# Patient Record
Sex: Female | Born: 1940
Health system: Southern US, Community
[De-identification: ages and names within clinical notes are randomized; demographics above are authoritative.]

## PROBLEM LIST (undated history)

## (undated) DIAGNOSIS — E079 Disorder of thyroid, unspecified: Secondary | ICD-10-CM

## (undated) DIAGNOSIS — Z803 Family history of malignant neoplasm of breast: Secondary | ICD-10-CM

## (undated) DIAGNOSIS — M109 Gout, unspecified: Secondary | ICD-10-CM

## (undated) DIAGNOSIS — C189 Malignant neoplasm of colon, unspecified: Secondary | ICD-10-CM

## (undated) DIAGNOSIS — I10 Essential (primary) hypertension: Secondary | ICD-10-CM

## (undated) DIAGNOSIS — E785 Hyperlipidemia, unspecified: Secondary | ICD-10-CM

## (undated) DIAGNOSIS — A389 Scarlet fever, uncomplicated: Secondary | ICD-10-CM

## (undated) HISTORY — DX: Essential (primary) hypertension: I10

## (undated) HISTORY — DX: Malignant neoplasm of colon, unspecified: C18.9

## (undated) HISTORY — DX: Hyperlipidemia, unspecified: E78.5

## (undated) HISTORY — DX: Gout, unspecified: M10.9

## (undated) HISTORY — PX: CHOLECYSTECTOMY: SHX55

## (undated) HISTORY — DX: Family history of malignant neoplasm of breast: Z80.3

---

## 1976-04-04 HISTORY — PX: BREAST BIOPSY: SHX20

## 1999-06-18 ENCOUNTER — Emergency Department (HOSPITAL_COMMUNITY): Admission: EM | Admit: 1999-06-18 | Discharge: 1999-06-18 | Payer: Self-pay | Admitting: Emergency Medicine

## 1999-06-18 ENCOUNTER — Encounter: Payer: Self-pay | Admitting: Emergency Medicine

## 1999-08-06 ENCOUNTER — Encounter: Payer: Self-pay | Admitting: Surgery

## 1999-08-06 ENCOUNTER — Ambulatory Visit (HOSPITAL_COMMUNITY): Admission: RE | Admit: 1999-08-06 | Discharge: 1999-08-06 | Payer: Self-pay | Admitting: Surgery

## 2004-01-16 ENCOUNTER — Ambulatory Visit: Payer: Self-pay

## 2004-04-21 ENCOUNTER — Ambulatory Visit: Payer: Self-pay | Admitting: Oncology

## 2005-07-05 ENCOUNTER — Ambulatory Visit: Payer: Self-pay | Admitting: Obstetrics & Gynecology

## 2005-12-21 ENCOUNTER — Ambulatory Visit: Payer: Self-pay | Admitting: Obstetrics & Gynecology

## 2006-09-13 ENCOUNTER — Ambulatory Visit: Payer: Self-pay | Admitting: Internal Medicine

## 2007-09-26 ENCOUNTER — Ambulatory Visit: Payer: Self-pay | Admitting: Internal Medicine

## 2008-09-29 ENCOUNTER — Ambulatory Visit: Payer: Self-pay | Admitting: Internal Medicine

## 2009-09-16 ENCOUNTER — Ambulatory Visit: Payer: Self-pay | Admitting: Internal Medicine

## 2009-09-30 ENCOUNTER — Ambulatory Visit: Payer: Self-pay | Admitting: Internal Medicine

## 2009-12-02 ENCOUNTER — Ambulatory Visit: Payer: Self-pay | Admitting: Gastroenterology

## 2010-11-08 ENCOUNTER — Ambulatory Visit: Payer: Self-pay | Admitting: Internal Medicine

## 2011-11-09 ENCOUNTER — Ambulatory Visit: Payer: Self-pay | Admitting: Internal Medicine

## 2013-02-05 ENCOUNTER — Ambulatory Visit: Payer: Self-pay | Admitting: Internal Medicine

## 2013-07-10 ENCOUNTER — Ambulatory Visit (INDEPENDENT_AMBULATORY_CARE_PROVIDER_SITE_OTHER): Payer: Medicare PPO | Admitting: Podiatry

## 2013-07-10 ENCOUNTER — Encounter: Payer: Self-pay | Admitting: Podiatry

## 2013-07-10 VITALS — BP 122/64 | HR 85 | Resp 16 | Ht 65.0 in | Wt 185.0 lb

## 2013-07-10 DIAGNOSIS — B351 Tinea unguium: Secondary | ICD-10-CM

## 2013-07-10 DIAGNOSIS — M79609 Pain in unspecified limb: Secondary | ICD-10-CM

## 2013-07-10 MED ORDER — EFINACONAZOLE 10 % EX SOLN: 1.0000 [drp] | CUTANEOUS | Status: DC

## 2013-07-10 NOTE — Patient Instructions (Signed)

## 2013-07-10 NOTE — Progress Notes (Signed)
   Subjective:    Patient ID: Jenna [name], female    DOB: 07/31/1940, 73 y.o.   MRN: [id number]  HPI Comments: "I have these bad toenails"  Patient C/O thick, discolored toenails for several years. She saw a commercial for Jublia and would like to try it. She asked PCP for Rx but referred here for treatment. She usually has her nails trimmed at the salon. The nails are not painful.     Review of Systems  Eyes: Positive for itching.  Musculoskeletal: Positive for arthralgias and gait problem.  All other systems reviewed and are negative.      Objective:   Physical Exam I have reviewed her past history medications allergies surgeries social history. Pulses are palpable bilateral. Neurologic sensorium is intact. Orthopedic evaluation demonstrates mild hammertoe deformities bilateral he can use evaluation demonstrates dry xerotic skin with plantar fissures. But nails are thick yellow dystrophic and clinically mycotic. Nails are painful.          Assessment & Plan:  Assessment: Pain in limb secondary to onychomycosis 1 through 5 bilateral hammertoes 1 through 5 bilateral.  Plan: Debridement nails 1 through 5 bilateral covered service secondary today and started her on a topical antifungal agent.

## 2013-11-01 ENCOUNTER — Other Ambulatory Visit: Payer: Self-pay | Admitting: *Deleted

## 2013-11-01 MED ORDER — EFINACONAZOLE 10 % EX SOLN: 1.0000 [drp] | CUTANEOUS | Status: DC

## 2013-11-01 NOTE — Telephone Encounter (Signed)
Request from [facility] to increase bottle size for patient for jublia . Approved

## 2014-07-30 ENCOUNTER — Ambulatory Visit
Admit: 2014-07-30 | Disposition: A | Discharge status: Home / Self Care | Payer: Self-pay | Attending: Internal Medicine | Admitting: Internal Medicine

## 2015-07-28 ENCOUNTER — Other Ambulatory Visit: Payer: Self-pay | Admitting: Internal Medicine

## 2015-07-28 DIAGNOSIS — M79605 Pain in left leg: Secondary | ICD-10-CM

## 2015-07-31 ENCOUNTER — Ambulatory Visit: Payer: Medicare PPO

## 2015-10-14 ENCOUNTER — Other Ambulatory Visit: Payer: Self-pay | Admitting: Internal Medicine

## 2015-10-14 DIAGNOSIS — Z1231 Encounter for screening mammogram for malignant neoplasm of breast: Secondary | ICD-10-CM

## 2015-10-16 ENCOUNTER — Other Ambulatory Visit: Payer: Self-pay | Admitting: Internal Medicine

## 2015-10-16 ENCOUNTER — Ambulatory Visit
Admission: RE | Admit: 2015-10-16 | Discharge: 2015-10-16 | Disposition: A | Discharge status: Home / Self Care | Payer: Medicare PPO | Source: Ambulatory Visit | Attending: Internal Medicine | Admitting: Internal Medicine

## 2015-10-16 DIAGNOSIS — Z1231 Encounter for screening mammogram for malignant neoplasm of breast: Secondary | ICD-10-CM | POA: Diagnosis not present

## 2015-11-16 ENCOUNTER — Other Ambulatory Visit: Payer: Self-pay | Admitting: Internal Medicine

## 2015-11-16 DIAGNOSIS — R600 Localized edema: Secondary | ICD-10-CM

## 2015-11-19 ENCOUNTER — Ambulatory Visit
Admission: RE | Admit: 2015-11-19 | Discharge: 2015-11-19 | Disposition: A | Discharge status: Home / Self Care | Payer: Medicare PPO | Source: Ambulatory Visit | Attending: Internal Medicine | Admitting: Internal Medicine

## 2015-11-19 DIAGNOSIS — R600 Localized edema: Secondary | ICD-10-CM

## 2015-11-19 DIAGNOSIS — R609 Edema, unspecified: Secondary | ICD-10-CM | POA: Diagnosis present

## 2016-07-29 ENCOUNTER — Telehealth: Payer: Self-pay | Admitting: Obstetrics & Gynecology

## 2016-07-29 NOTE — Telephone Encounter (Signed)
Pt is being referred by [facility] for H/O abnormal paps. Please schedule pt with [facility]. Unable to leave voicemail due to mail box full

## 2016-08-01 NOTE — Telephone Encounter (Signed)
Pt is schedule with [facility] 08/31/16

## 2016-08-31 ENCOUNTER — Encounter: Payer: Self-pay | Admitting: Obstetrics & Gynecology

## 2016-08-31 ENCOUNTER — Ambulatory Visit (INDEPENDENT_AMBULATORY_CARE_PROVIDER_SITE_OTHER): Payer: Medicare PPO | Admitting: Obstetrics & Gynecology

## 2016-08-31 VITALS — BP 120/80 | HR 88 | Ht 64.0 in | Wt 186.0 lb

## 2016-08-31 DIAGNOSIS — N893 Dysplasia of vagina, unspecified: Secondary | ICD-10-CM | POA: Diagnosis not present

## 2016-08-31 NOTE — Progress Notes (Signed)
Referring Provider:  [facility]  HPI:  TAMMARA MASSING is a 76 y.o.  No obstetric history on file.  who presents today for evaluation and management of abnormal cytology.    Dysplasia History:  LGSIL of vagina in past (last one was 7/17).  Prior TAH in 1971.  ROS:  Pertinent items noted in HPI and remainder of comprehensive ROS otherwise negative.  OB History  No data available    Past Medical History:  Diagnosis Date  . Gout   . Hyperlipidemia   . Hypertension     Past Surgical History:  Procedure Laterality Date  . BREAST BIOPSY Right 1978   neg  . CHOLECYSTECTOMY      SOCIAL HISTORY: History  Alcohol Use No   History  Drug Use No     Family History  Problem Relation Age of Onset  . Breast cancer Maternal Grandmother 32    ALLERGIES:  Patient has no known allergies.  Current Outpatient Prescriptions on File Prior to Visit  Medication Sig Dispense Refill  . ALLOPURINOL PO Take by mouth.    . Efinaconazole (JUBLIA) 10 % SOLN Apply 1 drop topically 1 day or 1 dose. 8 mL 11  . Olmesartan Medoxomil (BENICAR PO) Take by mouth.    . Rosuvastatin Calcium (CRESTOR PO) Take by mouth.     No current facility-administered medications on file prior to visit.     Physical Exam: -Vitals:  BP 120/80   Pulse 88   Ht 5\' 4"  (1.626 m)   Wt 186 lb (84.4 kg)   BMI 31.93 kg/m  GEN: WD, WN, NAD.  A+ O x 3, good mood and affect. ABD:  NT, ND.  Soft, no masses.  No hernias noted.   Pelvic:   Vulva: Normal appearance.  No lesions.  Vagina: No lesions or abnormalities noted.  Support: Normal pelvic support.  Urethra No masses tenderness or scarring.  Meatus Normal size without lesions or prolapse.  Cervix: See below.  Anus: Normal exam.  No lesions.  Perineum: Normal exam.  No lesions.        Bimanual   Uterus: Normal size.  Non-tender.  Mobile.  AV.  Adnexae: No masses.  Non-tender to palpation.  Cul-de-sac: Negative for abnormality.   PROCEDURE: 1.  Urine  Pregnancy Test:  not done 2.  Colposcopy performed with 4% acetic acid after verbal consent obtained                                         -Aceto-white Lesions Location(s): none.              -Biopsy performed at not done               -ECC indicated and performed: No.     -Biopsy sites made hemostatic with pressure, AgNO3, and/or Monsel's solution   -Satisfactory colposcopy: Yes.      -Evidence of Invasive  CA :  NO  ASSESSMENT:  KERLINE TRAHAN is a 76 y.o.  1. Vaginal dysplasia   .  PLAN: 1.  I discussed the grading system of pap smears and HPV high risk viral types.  We will discuss and base management after colpo results return. 2. Follow up PAP 6 months, vs intervention if high grade dysplasia identified 3. Treatment of persistantly abnormal PAP smears and  dysplasia, even mild, is discussed w pt today in  detail, as well as the pros and cons of vaginal progesterone therapy or surgery. Will consider and discuss after results.  Referral if needed for Gyn Onc consultation.     Barnett Applebaum, MD, [facility], [facility] 08/31/2016  2:16 PM

## 2016-09-05 LAB — PAP IG (IMAGE GUIDED): PAP Smear Comment: 0

## 2016-11-09 ENCOUNTER — Other Ambulatory Visit: Payer: Self-pay | Admitting: Internal Medicine

## 2016-11-09 DIAGNOSIS — Z1231 Encounter for screening mammogram for malignant neoplasm of breast: Secondary | ICD-10-CM

## 2016-11-25 ENCOUNTER — Ambulatory Visit
Admission: RE | Admit: 2016-11-25 | Discharge: 2016-11-25 | Disposition: A | Discharge status: Home / Self Care | Payer: Medicare PPO | Source: Ambulatory Visit | Attending: Internal Medicine | Admitting: Internal Medicine

## 2016-11-25 ENCOUNTER — Other Ambulatory Visit: Payer: Self-pay | Admitting: Internal Medicine

## 2016-11-25 DIAGNOSIS — Z1231 Encounter for screening mammogram for malignant neoplasm of breast: Secondary | ICD-10-CM

## 2017-06-23 ENCOUNTER — Other Ambulatory Visit: Payer: Self-pay | Admitting: Internal Medicine

## 2017-06-23 DIAGNOSIS — R944 Abnormal results of kidney function studies: Secondary | ICD-10-CM

## 2017-06-27 ENCOUNTER — Ambulatory Visit: Payer: Medicare PPO

## 2017-06-29 ENCOUNTER — Ambulatory Visit
Admission: RE | Admit: 2017-06-29 | Discharge: 2017-06-29 | Disposition: A | Discharge status: Home / Self Care | Payer: Medicare PPO | Source: Ambulatory Visit | Attending: Internal Medicine | Admitting: Internal Medicine

## 2017-06-29 DIAGNOSIS — R944 Abnormal results of kidney function studies: Secondary | ICD-10-CM | POA: Insufficient documentation

## 2017-12-20 ENCOUNTER — Other Ambulatory Visit: Payer: Self-pay | Admitting: Internal Medicine

## 2017-12-20 DIAGNOSIS — Z1231 Encounter for screening mammogram for malignant neoplasm of breast: Secondary | ICD-10-CM

## 2017-12-21 ENCOUNTER — Encounter: Payer: Self-pay | Admitting: Obstetrics & Gynecology

## 2017-12-21 ENCOUNTER — Other Ambulatory Visit (HOSPITAL_COMMUNITY)
Admission: RE | Admit: 2017-12-21 | Discharge: 2017-12-21 | Disposition: A | Discharge status: Home / Self Care | Payer: Medicare PPO | Source: Ambulatory Visit | Attending: Obstetrics & Gynecology | Admitting: Obstetrics & Gynecology

## 2017-12-21 ENCOUNTER — Ambulatory Visit (INDEPENDENT_AMBULATORY_CARE_PROVIDER_SITE_OTHER): Payer: Medicare PPO | Admitting: Obstetrics & Gynecology

## 2017-12-21 VITALS — BP 122/80 | Ht 60.0 in | Wt 198.0 lb

## 2017-12-21 DIAGNOSIS — Z87411 Personal history of vaginal dysplasia: Secondary | ICD-10-CM | POA: Diagnosis not present

## 2017-12-21 DIAGNOSIS — Z124 Encounter for screening for malignant neoplasm of cervix: Secondary | ICD-10-CM | POA: Diagnosis not present

## 2017-12-21 NOTE — Progress Notes (Signed)
  HPI:  Patient is a 77 y.o. presenting for follow up evaluation of abnormal PAP smear in the past.  Her last PAP was 16 months ago and was abnormal: LGSIL (This was a vaginal PAP as she had TAH years ago). She has had a prior colposcopy. Prior biopsies - none recently.  PMHx: She  has a past medical history of Gout, Hyperlipidemia, and Hypertension. Also,  has a past surgical history that includes Cholecystectomy and Breast biopsy (Right, 1978)., family history includes Breast cancer (age of onset: 62) in her maternal grandmother.,  reports that she has been smoking cigarettes. She has been smoking about 0.50 packs per day. She has never used smokeless tobacco. She reports that she does not drink alcohol or use drugs.  She has a current medication list which includes the following prescription(s): allopurinol, efinaconazole, olmesartan medoxomil, and rosuvastatin calcium. Also, has No Known Allergies.  Review of Systems  Constitutional: Negative for chills, fever and malaise/fatigue.  HENT: Negative for congestion, sinus pain and sore throat.   Eyes: Negative for blurred vision and pain.  Respiratory: Negative for cough and wheezing.   Cardiovascular: Negative for chest pain and leg swelling.  Gastrointestinal: Negative for abdominal pain, constipation, diarrhea, heartburn, nausea and vomiting.  Genitourinary: Negative for dysuria, frequency, hematuria and urgency.  Musculoskeletal: Negative for back pain, joint pain, myalgias and neck pain.  Skin: Negative for itching and rash.  Neurological: Negative for dizziness, tremors and weakness.  Endo/Heme/Allergies: Does not bruise/bleed easily.  Psychiatric/Behavioral: Negative for depression. The patient is not nervous/anxious and does not have insomnia.    Objective: BP 122/80   Ht 5' (1.524 m)   Wt 198 lb (89.8 kg)   BMI 38.67 kg/m  Filed Weights   12/21/17 0924  Weight: 198 lb (89.8 kg)   Body mass index is 38.67 kg/m.  Physical  examination Physical Exam  Constitutional: She is oriented to person, place, and time. She appears well-developed and well-nourished. No distress.  Genitourinary: Vagina normal. Pelvic exam was performed with patient supine. There is no rash, tenderness or lesion on the right labia. There is no rash, tenderness or lesion on the left labia. No erythema or bleeding in the vagina.  Genitourinary Comments: Cuff intact/ no lesions  Absent uterus and cervix  HENT:  Head: Normocephalic and atraumatic.  Nose: Nose normal.  Mouth/Throat: Oropharynx is clear and moist.  Abdominal: Soft. She exhibits no distension. There is no tenderness.  Musculoskeletal: Normal range of motion.  Neurological: She is alert and oriented to person, place, and time. No cranial nerve deficit.  Skin: Skin is warm and dry.  Psychiatric: She has a normal mood and affect.   ASSESSMENT:  History of Vaginal Dysplasia  Plan: 1.  I discussed the grading system of pap smears and HPV high risk viral types.   2. Follow up PAP 12 months, vs intervention if high grade dysplasia identified. 3. Treatment of persistantly abnormal PAP smears and vaginal dysplasia, even mild, is discussed w pt today in detail, as well as the pros and cons of procedures. Will consider and discuss after results. 4. Pt having MMG soon.  Barnett Applebaum, MD, [facility], [facility] 12/21/2017  9:25 AM

## 2017-12-26 LAB — CYTOLOGY - PAP: DIAGNOSIS: UNDETERMINED — AB

## 2018-01-02 ENCOUNTER — Ambulatory Visit
Admission: RE | Admit: 2018-01-02 | Discharge: 2018-01-02 | Disposition: A | Discharge status: Home / Self Care | Payer: Medicare PPO | Source: Ambulatory Visit | Attending: Internal Medicine | Admitting: Internal Medicine

## 2018-01-02 DIAGNOSIS — Z1231 Encounter for screening mammogram for malignant neoplasm of breast: Secondary | ICD-10-CM | POA: Insufficient documentation

## 2018-12-19 ENCOUNTER — Other Ambulatory Visit: Payer: Self-pay | Admitting: Internal Medicine

## 2018-12-19 DIAGNOSIS — Z1231 Encounter for screening mammogram for malignant neoplasm of breast: Secondary | ICD-10-CM

## 2019-02-05 ENCOUNTER — Ambulatory Visit
Admission: RE | Admit: 2019-02-05 | Discharge: 2019-02-05 | Disposition: A | Discharge status: Home / Self Care | Payer: Medicare HMO | Source: Ambulatory Visit | Attending: Internal Medicine | Admitting: Internal Medicine

## 2019-02-05 DIAGNOSIS — Z1231 Encounter for screening mammogram for malignant neoplasm of breast: Secondary | ICD-10-CM | POA: Diagnosis not present

## 2019-02-06 ENCOUNTER — Other Ambulatory Visit: Payer: Self-pay | Admitting: Family

## 2019-02-06 DIAGNOSIS — N6320 Unspecified lump in the left breast, unspecified quadrant: Secondary | ICD-10-CM

## 2019-02-06 DIAGNOSIS — R928 Other abnormal and inconclusive findings on diagnostic imaging of breast: Secondary | ICD-10-CM

## 2019-02-14 ENCOUNTER — Ambulatory Visit
Admission: RE | Admit: 2019-02-14 | Discharge: 2019-02-14 | Disposition: A | Discharge status: Home / Self Care | Payer: Medicare HMO | Source: Ambulatory Visit | Attending: Family | Admitting: Family

## 2019-02-14 DIAGNOSIS — R928 Other abnormal and inconclusive findings on diagnostic imaging of breast: Secondary | ICD-10-CM | POA: Diagnosis not present

## 2019-02-14 DIAGNOSIS — N6320 Unspecified lump in the left breast, unspecified quadrant: Secondary | ICD-10-CM

## 2019-05-06 DIAGNOSIS — I639 Cerebral infarction, unspecified: Secondary | ICD-10-CM

## 2019-05-06 HISTORY — DX: Cerebral infarction, unspecified: I63.9

## 2019-05-12 ENCOUNTER — Emergency Department: Payer: Medicare HMO

## 2019-05-12 ENCOUNTER — Inpatient Hospital Stay
Admission: EM | Admit: 2019-05-12 | Discharge: 2019-05-14 | DRG: 063 | Disposition: A | Discharge status: Home / Self Care | Payer: Medicare HMO | Attending: Internal Medicine | Admitting: Internal Medicine

## 2019-05-12 ENCOUNTER — Other Ambulatory Visit: Payer: Self-pay

## 2019-05-12 ENCOUNTER — Inpatient Hospital Stay: Payer: Medicare HMO

## 2019-05-12 DIAGNOSIS — N1831 Chronic kidney disease, stage 3a: Secondary | ICD-10-CM

## 2019-05-12 DIAGNOSIS — I6340 Cerebral infarction due to embolism of unspecified cerebral artery: Secondary | ICD-10-CM | POA: Diagnosis present

## 2019-05-12 DIAGNOSIS — E785 Hyperlipidemia, unspecified: Secondary | ICD-10-CM

## 2019-05-12 DIAGNOSIS — Z20822 Contact with and (suspected) exposure to covid-19: Secondary | ICD-10-CM | POA: Diagnosis present

## 2019-05-12 DIAGNOSIS — I6310 Cerebral infarction due to embolism of unspecified precerebral artery: Secondary | ICD-10-CM | POA: Diagnosis not present

## 2019-05-12 DIAGNOSIS — R4701 Aphasia: Secondary | ICD-10-CM | POA: Diagnosis present

## 2019-05-12 DIAGNOSIS — H53461 Homonymous bilateral field defects, right side: Secondary | ICD-10-CM | POA: Diagnosis present

## 2019-05-12 DIAGNOSIS — F17210 Nicotine dependence, cigarettes, uncomplicated: Secondary | ICD-10-CM | POA: Diagnosis present

## 2019-05-12 DIAGNOSIS — I10 Essential (primary) hypertension: Secondary | ICD-10-CM | POA: Diagnosis not present

## 2019-05-12 DIAGNOSIS — E039 Hypothyroidism, unspecified: Secondary | ICD-10-CM

## 2019-05-12 DIAGNOSIS — Z79899 Other long term (current) drug therapy: Secondary | ICD-10-CM | POA: Diagnosis not present

## 2019-05-12 DIAGNOSIS — I639 Cerebral infarction, unspecified: Secondary | ICD-10-CM | POA: Diagnosis not present

## 2019-05-12 DIAGNOSIS — R29701 NIHSS score 1: Secondary | ICD-10-CM | POA: Diagnosis present

## 2019-05-12 DIAGNOSIS — R519 Headache, unspecified: Secondary | ICD-10-CM | POA: Diagnosis present

## 2019-05-12 DIAGNOSIS — Z79890 Hormone replacement therapy (postmenopausal): Secondary | ICD-10-CM | POA: Diagnosis not present

## 2019-05-12 DIAGNOSIS — I129 Hypertensive chronic kidney disease with stage 1 through stage 4 chronic kidney disease, or unspecified chronic kidney disease: Secondary | ICD-10-CM | POA: Diagnosis present

## 2019-05-12 DIAGNOSIS — M109 Gout, unspecified: Secondary | ICD-10-CM | POA: Diagnosis present

## 2019-05-12 HISTORY — DX: Disorder of thyroid, unspecified: E07.9

## 2019-05-12 LAB — CBC
HCT: 40.1 % (ref 36.0–46.0)
Hemoglobin: 12.9 g/dL (ref 12.0–15.0)
MCH: 29.8 pg (ref 26.0–34.0)
MCHC: 32.2 g/dL (ref 30.0–36.0)
MCV: 92.6 fL (ref 80.0–100.0)
Platelets: 257 10*3/uL (ref 150–400)
RBC: 4.33 MIL/uL (ref 3.87–5.11)
RDW: 13.9 % (ref 11.5–15.5)
WBC: 7.7 10*3/uL (ref 4.0–10.5)
nRBC: 0 % (ref 0.0–0.2)

## 2019-05-12 LAB — DIFFERENTIAL
Abs Immature Granulocytes: 0.04 10*3/uL (ref 0.00–0.07)
Basophils Absolute: 0.1 10*3/uL (ref 0.0–0.1)
Basophils Relative: 1 %
Eosinophils Absolute: 0.2 10*3/uL (ref 0.0–0.5)
Eosinophils Relative: 3 %
Immature Granulocytes: 1 %
Lymphocytes Relative: 36 %
Lymphs Abs: 2.7 10*3/uL (ref 0.7–4.0)
Monocytes Absolute: 0.5 10*3/uL (ref 0.1–1.0)
Monocytes Relative: 7 %
Neutro Abs: 4.1 10*3/uL (ref 1.7–7.7)
Neutrophils Relative %: 52 %

## 2019-05-12 LAB — COMPREHENSIVE METABOLIC PANEL
ALT: 10 U/L (ref 0–44)
AST: 16 U/L (ref 15–41)
Albumin: 4.4 g/dL (ref 3.5–5.0)
Alkaline Phosphatase: 44 U/L (ref 38–126)
Anion gap: 9 (ref 5–15)
BUN: 27 mg/dL — ABNORMAL HIGH (ref 8–23)
CO2: 24 mmol/L (ref 22–32)
Calcium: 9.3 mg/dL (ref 8.9–10.3)
Chloride: 103 mmol/L (ref 98–111)
Creatinine, Ser: 1.15 mg/dL — ABNORMAL HIGH (ref 0.44–1.00)
GFR calc Af Amer: 54 mL/min — ABNORMAL LOW (ref >60)
GFR calc non Af Amer: 46 mL/min — ABNORMAL LOW (ref >60)
Glucose, Bld: 102 mg/dL — ABNORMAL HIGH (ref 70–99)
Potassium: 3.9 mmol/L (ref 3.5–5.1)
Sodium: 136 mmol/L (ref 135–145)
Total Bilirubin: 0.5 mg/dL (ref 0.3–1.2)
Total Protein: 7.5 g/dL (ref 6.5–8.1)

## 2019-05-12 LAB — BRAIN NATRIURETIC PEPTIDE: B Natriuretic Peptide: 50 pg/mL (ref 0.0–100.0)

## 2019-05-12 LAB — LACTIC ACID, PLASMA
Lactic Acid, Venous: 1.2 mmol/L (ref 0.5–1.9)
Lactic Acid, Venous: 1 mmol/L (ref 0.5–1.9)

## 2019-05-12 LAB — RESPIRATORY PANEL BY RT PCR (FLU A&B, COVID)
Influenza A by PCR: NEGATIVE
Influenza B by PCR: NEGATIVE
SARS Coronavirus 2 by RT PCR: NEGATIVE

## 2019-05-12 LAB — PROTIME-INR
INR: 1 (ref 0.8–1.2)
INR: 1 (ref 0.8–1.2)
Prothrombin Time: 12.8 seconds (ref 11.4–15.2)
Prothrombin Time: 13.2 seconds (ref 11.4–15.2)

## 2019-05-12 LAB — GLUCOSE, CAPILLARY
Glucose-Capillary: 92 mg/dL (ref 70–99)
Glucose-Capillary: 106 mg/dL — ABNORMAL HIGH (ref 70–99)

## 2019-05-12 LAB — CORTISOL: Cortisol, Plasma: 9.7 ug/dL

## 2019-05-12 LAB — MRSA PCR SCREENING: MRSA by PCR: NEGATIVE

## 2019-05-12 LAB — PROCALCITONIN: Procalcitonin: <0.1 ng/mL

## 2019-05-12 LAB — APTT: aPTT: 32 seconds (ref 24–36)

## 2019-05-12 MED ORDER — PANTOPRAZOLE SODIUM 40 MG IV SOLR
40.0000 mg | Freq: Every day | INTRAVENOUS | Status: DC
  Administered 2019-05-12: 21:00:00 40 mg via INTRAVENOUS
  Filled 2019-05-12: qty 40

## 2019-05-12 MED ORDER — ACETAMINOPHEN 650 MG RE SUPP: 650.0000 mg | RECTAL | Status: DC | PRN

## 2019-05-12 MED ORDER — ACETAMINOPHEN 325 MG PO TABS: 650.0000 mg | ORAL_TABLET | ORAL | Status: DC | PRN

## 2019-05-12 MED ORDER — SENNOSIDES-DOCUSATE SODIUM 8.6-50 MG PO TABS: 1.0000 | ORAL_TABLET | Freq: Every evening | ORAL | Status: DC | PRN

## 2019-05-12 MED ORDER — LABETALOL HCL 5 MG/ML IV SOLN: 20.0000 mg | Freq: Once | INTRAVENOUS | Status: DC

## 2019-05-12 MED ORDER — ONDANSETRON HCL 4 MG/2ML IJ SOLN: 4.0000 mg | Freq: Four times a day (QID) | INTRAMUSCULAR | Status: DC | PRN

## 2019-05-12 MED ORDER — FAMOTIDINE IN NACL 20-0.9 MG/50ML-% IV SOLN
20.0000 mg | Freq: Two times a day (BID) | INTRAVENOUS | Status: DC
  Administered 2019-05-12: 20 mg via INTRAVENOUS
  Filled 2019-05-12: qty 50

## 2019-05-12 MED ORDER — CLEVIDIPINE BUTYRATE 0.5 MG/ML IV EMUL: 0.0000 mg/h | INTRAVENOUS | Status: DC

## 2019-05-12 MED ORDER — SODIUM CHLORIDE 0.9 % IV SOLN: INTRAVENOUS | Status: DC

## 2019-05-12 MED ORDER — HEPARIN SODIUM (PORCINE) 5000 UNIT/ML IJ SOLN
5000.0000 [IU] | Freq: Three times a day (TID) | INTRAMUSCULAR | Status: DC
  Administered 2019-05-13 – 2019-05-14 (×3): 5000 [IU] via SUBCUTANEOUS
  Filled 2019-05-12 (×3): qty 1

## 2019-05-12 MED ORDER — ALTEPLASE (STROKE) FULL DOSE INFUSION
0.9000 mg/kg | Freq: Once | INTRAVENOUS | Status: AC
  Administered 2019-05-12: 70.7 mg via INTRAVENOUS
  Filled 2019-05-12: qty 100

## 2019-05-12 MED ORDER — ACETAMINOPHEN 160 MG/5ML PO SOLN
650.0000 mg | ORAL | Status: DC | PRN
  Filled 2019-05-12: qty 20.3

## 2019-05-12 MED ORDER — SODIUM CHLORIDE 0.9% FLUSH: 3.0000 mL | Freq: Once | INTRAVENOUS | Status: DC

## 2019-05-12 MED ORDER — SODIUM CHLORIDE 0.9% FLUSH
3.0000 mL | Freq: Two times a day (BID) | INTRAVENOUS | Status: DC
  Administered 2019-05-12 – 2019-05-14 (×4): 3 mL via INTRAVENOUS

## 2019-05-12 MED ORDER — CHLORHEXIDINE GLUCONATE CLOTH 2 % EX PADS
6.0000 | MEDICATED_PAD | Freq: Every day | CUTANEOUS | Status: DC
  Administered 2019-05-12 – 2019-05-13 (×2): 6 via TOPICAL

## 2019-05-12 MED ORDER — SODIUM CHLORIDE 0.9 % IV SOLN
50.0000 mL | Freq: Once | INTRAVENOUS | Status: AC
  Administered 2019-05-12: 16:00:00 50 mL via INTRAVENOUS

## 2019-05-12 MED ORDER — SODIUM CHLORIDE 0.9% FLUSH: 3.0000 mL | INTRAVENOUS | Status: DC | PRN

## 2019-05-12 MED ORDER — STROKE: EARLY STAGES OF RECOVERY BOOK: Freq: Once | Status: DC

## 2019-05-12 MED ORDER — SODIUM CHLORIDE 0.9 % IV SOLN: 250.0000 mL | INTRAVENOUS | Status: DC | PRN

## 2019-05-12 NOTE — ED Notes (Signed)
tPa restarted per Dr. Doy Mince

## 2019-05-12 NOTE — H&P (Signed)
CRITICAL CARE PROGRESS NOTE    Name: Jenna [name] MRN: [id number] DOB: 1941/01/26     LOS: 0   SUBJECTIVE FINDINGS & SIGNIFICANT EVENTS   Patient description:  This is a pleasant 79 year old female with a history of osteoarthritis, chronic sciatica, history of left femoral fracture 4 years ago, who came in with signs and symptoms of aphasia suggestive of acute CVA.  She also had acute onset of left-sided headache with loss of right-sided visual field.  The symptoms lasted for short period of time.  In the ED patient was evaluated by neurologist and it was decided to administer TPA.  Critical care consultation was placed by ED physician for further evaluation and management in the intensive care unit.   Lines / Drains: PIV x2  Cultures / Sepsis markers: Respiratory and blood  Antibiotics: None   Protocols / Consultants: Neurology status post TPA     PAST MEDICAL HISTORY   Past Medical History:  Diagnosis Date  . Gout   . Hyperlipidemia   . Hypertension   . Thyroid disease      SURGICAL HISTORY   Past Surgical History:  Procedure Laterality Date  . BREAST BIOPSY Right 1978   neg  . CHOLECYSTECTOMY       FAMILY HISTORY   Family History  Problem Relation Age of Onset  . Breast cancer Maternal Grandmother 23  . Breast cancer Cousin      SOCIAL HISTORY   Social History   Tobacco Use  . Smoking status: Current Every Day Smoker    Packs/day: 0.50    Types: Cigarettes  . Smokeless tobacco: Never Used  Substance Use Topics  . Alcohol use: No  . Drug use: No     MEDICATIONS   Current Medication:  Current Facility-Administered Medications:  .   stroke: mapping our early stages of recovery book, , Does not apply, Once, Alexis Goodell, MD .  0.9 %  sodium chloride  infusion, 250 mL, Intravenous, PRN, Lanney Gins, Hillarie Harrigan, MD .  0.9 %  sodium chloride infusion, , Intravenous, Continuous, Alexis Goodell, MD .  acetaminophen (TYLENOL) tablet 650 mg, 650 mg, Oral, Q4H PRN **OR** acetaminophen (TYLENOL) 160 MG/5ML solution 650 mg, 650 mg, Per Tube, Q4H PRN **OR** acetaminophen (TYLENOL) suppository 650 mg, 650 mg, Rectal, Q4H PRN, Alexis Goodell, MD .  Chlorhexidine Gluconate Cloth 2 % PADS 6 each, 6 each, Topical, Daily, Etola Mull, MD .  labetalol (NORMODYNE) injection 20 mg, 20 mg, Intravenous, Once **AND** clevidipine (CLEVIPREX) infusion 0.5 mg/mL, 0-21 mg/hr, Intravenous, Continuous, Alexis Goodell, MD .  famotidine (PEPCID) IVPB 20 mg premix, 20 mg, Intravenous, Q12H, Ottie Glazier, MD .  Derrill Memo ON 05/13/2019] heparin injection 5,000 Units, 5,000 Units, Subcutaneous, Q8H, Twinkle Sockwell, MD .  ondansetron (ZOFRAN) injection 4 mg, 4 mg, Intravenous, Q6H PRN, Lanney Gins, Insiya Oshea, MD .  pantoprazole (PROTONIX) injection 40 mg, 40 mg, Intravenous, QHS, Alexis Goodell, MD .  senna-docusate (Senokot-S) tablet 1 tablet, 1 tablet, Oral, QHS PRN, Alexis Goodell, MD .  sodium chloride flush (NS) 0.9 % injection 3 mL, 3 mL, Intravenous, Once, Arta Silence, MD .  sodium chloride flush (NS) 0.9 % injection 3 mL, 3 mL, Intravenous, Q12H, Larinda Herter, MD .  sodium chloride flush (NS) 0.9 % injection 3 mL, 3 mL, Intravenous, PRN, Ottie Glazier, MD    ALLERGIES   Patient has no known allergies.    REVIEW OF SYSTEMS    Unable to obtain status post acute CVA  PHYSICAL EXAMINATION  Vital Signs: Temp:  [98 F (36.7 C)-98.3 F (36.8 C)] 98 F (36.7 C) (02/07 1647) Pulse Rate:  [74-81] 80 (02/07 1647) Resp:  [15-23] 23 (02/07 1647) BP: (106-153)/(60-86) 135/66 (02/07 1647) SpO2:  [96 %-100 %] 96 % (02/07 1647) Weight:  [78.5 kg-79.8 kg] 79.8 kg (02/07 1647)  GENERAL: Age-appropriate HEAD: Normocephalic, atraumatic.  EYES: Pupils equal,  round, reactive to light.  No scleral icterus.  MOUTH: Moist mucosal membrane. NECK: Supple. No thyromegaly. No nodules. No JVD.  PULMONARY: Clear to auscultation CARDIOVASCULAR: S1 and S2. Regular rate and rhythm. No murmurs, rubs, or gallops.  GASTROINTESTINAL: Soft, nontender, non-distended. No masses. Positive bowel sounds. No hepatosplenomegaly.  MUSCULOSKELETAL: No swelling, clubbing, or edema.  NEUROLOGIC: That is post acute CVA status post TPA SKIN:intact,warm,dry   PERTINENT DATA     Infusions: . sodium chloride    . sodium chloride    . clevidipine    . famotidine (PEPCID) IV     Scheduled Medications: .  stroke: mapping our early stages of recovery book   Does not apply Once  . Chlorhexidine Gluconate Cloth  6 each Topical Daily  . [START ON 05/13/2019] heparin  5,000 Units Subcutaneous Q8H  . labetalol  20 mg Intravenous Once  . pantoprazole (PROTONIX) IV  40 mg Intravenous QHS  . sodium chloride flush  3 mL Intravenous Once  . sodium chloride flush  3 mL Intravenous Q12H   PRN Medications: sodium chloride, acetaminophen **OR** acetaminophen (TYLENOL) oral liquid 160 mg/5 mL **OR** acetaminophen, ondansetron (ZOFRAN) IV, senna-docusate, sodium chloride flush Hemodynamic parameters:   Intake/Output: No intake/output data recorded.  Ventilator  Settings:     LAB RESULTS:  Basic Metabolic Panel: Recent Labs  Lab 05/12/19 1406  NA 136  K 3.9  CL 103  CO2 24  GLUCOSE 102*  BUN 27*  CREATININE 1.15*  CALCIUM 9.3   Liver Function Tests: Recent Labs  Lab 05/12/19 1406  AST 16  ALT 10  ALKPHOS 44  BILITOT 0.5  PROT 7.5  ALBUMIN 4.4   No results for input(s): LIPASE, AMYLASE in the last 168 hours. No results for input(s): AMMONIA in the last 168 hours. CBC: Recent Labs  Lab 05/12/19 1406  WBC 7.7  NEUTROABS 4.1  HGB 12.9  HCT 40.1  MCV 92.6  PLT 257   Cardiac Enzymes: No results for input(s): CKTOTAL, CKMB, CKMBINDEX, TROPONINI in the  last 168 hours. BNP: Invalid input(s): POCBNP CBG: Recent Labs  Lab 05/12/19 1350  GLUCAP 92     IMAGING RESULTS:  Imaging: CT HEAD WO CONTRAST  Result Date: 05/12/2019 CLINICAL DATA:  Stroke follow-up.  Worsening headache. EXAM: CT HEAD WITHOUT CONTRAST TECHNIQUE: Contiguous axial images were obtained from the base of the skull through the vertex without intravenous contrast. COMPARISON:  Earlier today FINDINGS: Brain: No evidence of acute cortical infarction, hemorrhage, hydrocephalus, extra-axial collection or mass lesion/mass effect. Right lacunar infarct at the thalamus as noted on prior. Vascular: Atherosclerotic calcification.  No hyperdense vessel. Skull: Normal. Negative for fracture or focal lesion. Sinuses/Orbits: Negative Other: These results were communicated to [facility] at 3:27 pmon 2/7/2021by text page via the [facility] messaging system. IMPRESSION: 1. Stable head CT. 2. Lacunar infarct at the right thalamus. Electronically Signed   By: Monte Fantasia M.D.   On: 05/12/2019 15:28   CT HEAD CODE STROKE WO CONTRAST  Addendum Date: 05/12/2019   ADDENDUM REPORT: 05/12/2019 14:17 ADDENDUM: Study discussed by telephone with Dr. Arta Silence on 05/12/2019 at 1407 hours.  Electronically Signed   By: Genevie Ann M.D.   On: 05/12/2019 14:17   Result Date: 05/12/2019 CLINICAL DATA:  Code stroke. 79 year old female with sudden onset slurred speech. EXAM: CT HEAD WITHOUT CONTRAST TECHNIQUE: Contiguous axial images were obtained from the base of the skull through the vertex without intravenous contrast. COMPARISON:  None. FINDINGS: Brain: Normal cerebral volume. No midline shift, mass effect, or evidence of intracranial mass lesion. No ventriculomegaly. No acute intracranial hemorrhage identified. There is a small hypodensity in the right thalamus on series 2, image 15. This is age indeterminate. Minimal for age scattered white matter hypodensity elsewhere. No cortically based acute infarct  identified. Vascular: Calcified atherosclerosis at the skull base. No suspicious intracranial vascular hyperdensity. Skull: Negative. Sinuses/Orbits: Mild mucosal thickening in the sphenoid sinuses, mild bubbly opacity on the left. Other Visualized paranasal sinuses and mastoids are stable and well pneumatized. Other: Visualized orbits and scalp soft tissues are within normal limits. ASPECTS [facility] Stroke Program Early CT Score) Total score (0-10 with 10 being normal): 10 IMPRESSION: 1. 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 Query acute left side weakness. 2. No superimposed acute cortically based infarct or acute intracranial hemorrhage identified. 3.  ASPECTS 10. Electronically Signed: By: Genevie Ann M.D. On: 05/12/2019 14:05   Reviewed CT head today 05/12/2019-CT head with acute lacunar infarct of the right thalamus   ASSESSMENT AND PLAN      Acute cerebrovascular accident  -Neurology on case-appreciate input Case discussed with Dr. Doy Mince today -Status post TPA -Every hour neuro checks -CT head with lacunar infarct right thalamus -Additional imaging as per neurology in process -Statin and aspirin when appropriate   GI/Nutrition GI PROPHYLAXIS as indicated DIET-->TF's as tolerated Constipation protocol as indicated  ENDO - ICU hypoglycemic\Hyperglycemia protocol -check FSBS per protocol   ELECTROLYTES -follow labs as needed -replace as needed -pharmacy consultation   DVT/GI PRX ordered -SCDs  TRANSFUSIONS AS NEEDED MONITOR FSBS ASSESS the need for LABS as needed   Critical care provider statement:    Critical care time (minutes):  33   Critical care time was exclusive of:  Separately billable procedures and treating other patients   Critical care was necessary to treat or prevent imminent or life-threatening deterioration of the following conditions:   Acute CVA, aphasia, right-sided hemianopsia, multiple comorbid conditions   Critical care was time  spent personally by me on the following activities:  Development of treatment plan with patient or surrogate, discussions with consultants, evaluation of patient's response to treatment, examination of patient, obtaining history from patient or surrogate, ordering and performing treatments and interventions, ordering and review of laboratory studies and re-evaluation of patient's condition.  I assumed direction of critical care for this patient from another provider in my specialty: no    This document was prepared using Dragon voice recognition software and may include unintentional dictation errors.    Ottie Glazier, M.D.  Division of [facility]

## 2019-05-12 NOTE — ED Notes (Addendum)
Arrived to CT with patient. Pt assisted to scanner

## 2019-05-12 NOTE — ED Notes (Signed)
Pt c/o increase in headache, Dr. Doy Mince to bedside, tPa held, new Ct to be performed, pt transported to CT at this time.

## 2019-05-12 NOTE — Consult Note (Signed)
Requesting Physician: Siadecki    Chief Complaint: Aphasia  I have been asked by Dr. Cherylann Banas to see this patient in consultation for stroke.  HPI: Jenna [name] is an 79 y.o. female with a history of HTN and HLD who was at home today.  Had acute onset of a left sided headache.  Then noted that her vision went away on the right.  Not long after that her headache went away and her vision returned but then she was unable to speak well.  Speech has not returned to baseline.  NIHSS of 1.    Date last known well: 05/07/2019 Time last known well: Time: 11:30 tPA Given: Yes  Past Medical History:  Diagnosis Date  . Gout   . Hyperlipidemia   . Hypertension     Past Surgical History:  Procedure Laterality Date  . BREAST BIOPSY Right 1978   neg  . CHOLECYSTECTOMY      Family History  Problem Relation Age of Onset  . Breast cancer Maternal Grandmother 50  . Breast cancer Cousin    Social History:  reports that she has been smoking cigarettes. She has been smoking about 0.50 packs per day. She has never used smokeless tobacco. She reports that she does not drink alcohol or use drugs.  Allergies: No Known Allergies  Medications: I have reviewed the patient's current medications. Prior to Admission:  Prior to Admission medications   Medication Sig Start Date End Date Taking? Authorizing Provider  ALLOPURINOL PO Take by mouth.    [provider]  Efinaconazole (JUBLIA) 10 % SOLN Apply 1 drop topically 1 day or 1 dose. 11/01/13   Hyatt, Max T, DPM  Olmesartan Medoxomil (BENICAR PO) Take by mouth.    [provider]  Rosuvastatin Calcium (CRESTOR PO) Take by mouth.    [provider]    ROS: History obtained from the patient  General ROS: negative for - chills, fatigue, fever, night sweats, weight gain or weight loss Psychological ROS: negative for - behavioral disorder, hallucinations, memory difficulties, mood swings or suicidal ideation Ophthalmic ROS:  as noted in HPI ENT ROS: negative for - epistaxis, nasal discharge, oral lesions, sore throat, tinnitus or vertigo Allergy and Immunology ROS: negative for - hives or itchy/watery eyes Hematological and Lymphatic ROS: negative for - bleeding problems, bruising or swollen lymph nodes Endocrine ROS: negative for - galactorrhea, hair pattern changes, polydipsia/polyuria or temperature intolerance Respiratory ROS: negative for - cough, hemoptysis, shortness of breath or wheezing Cardiovascular ROS: negative for - chest pain, dyspnea on exertion, edema or irregular heartbeat Gastrointestinal ROS: negative for - abdominal pain, diarrhea, hematemesis, nausea/vomiting or stool incontinence Genito-Urinary ROS: negative for - dysuria, hematuria, incontinence or urinary frequency/urgency Musculoskeletal ROS: negative for - joint swelling or muscular weakness Neurological ROS: as noted in HPI Dermatological ROS: negative for rash and skin lesion changes  Physical Examination: Blood pressure (!) 153/83, pulse 81, temperature 98.3 F (36.8 C), resp. rate 18, height 5\' 4"  (1.626 m), weight 78.5 kg, SpO2 99 %.  HEENT-  Normocephalic, no lesions, without obvious abnormality.  Normal external eye and conjunctiva.  Normal TM's bilaterally.  Normal auditory canals and external ears. Normal external nose, mucus membranes and septum.  Normal pharynx. Cardiovascular- S1, S2 normal, pulses palpable throughout   Lungs- chest clear, no wheezing, rales, normal symmetric air entry Abdomen- soft, non-tender; bowel sounds normal; no masses,  no organomegaly Extremities- LE edema Lymph-no adenopathy palpable Musculoskeletal-no joint tenderness, deformity or swelling Skin-warm and dry,  no hyperpigmentation, vitiligo, or suspicious lesions  Neurological Examination   Mental Status: Alert, oriented, thought content appropriate.  Expressive aphasia noted.  Able to follow simple commands but requires reinforcement to follow  3-step commands. Cranial Nerves: II: Discs flat bilaterally; Visual fields grossly normal, pupils equal, round, reactive to light and accommodation III,IV, VI: ptosis not present, extra-ocular motions intact bilaterally V,VII: smile symmetric, facial light touch sensation normal bilaterally VIII: hearing normal bilaterally IX,X: gag reflex present XI: bilateral shoulder shrug XII: midline tongue extension Motor: Right : Upper extremity   5/5    Left:     Upper extremity   5/5  Lower extremity   5/5     Lower extremity   5/5 Tone and bulk:normal tone throughout; no atrophy noted Sensory: Pinprick and light touch intact throughout, bilaterally Deep Tendon Reflexes: Symmetric throughout Plantars: Right: mute   Left: mute Cerebellar: Normal finger-to-nose and normal heel-to-shin testing bilaterally Gait: not tested due to safety concerns    Laboratory Studies:  Basic Metabolic Panel: Recent Labs  Lab 05/12/19 1406  NA 136  K 3.9  CL 103  CO2 24  GLUCOSE 102*  BUN 27*  CREATININE 1.15*  CALCIUM 9.3    Liver Function Tests: Recent Labs  Lab 05/12/19 1406  AST 16  ALT 10  ALKPHOS 44  BILITOT 0.5  PROT 7.5  ALBUMIN 4.4   No results for input(s): LIPASE, AMYLASE in the last 168 hours. No results for input(s): AMMONIA in the last 168 hours.  CBC: Recent Labs  Lab 05/12/19 1406  WBC 7.7  NEUTROABS 4.1  HGB 12.9  HCT 40.1  MCV 92.6  PLT 257    Cardiac Enzymes: No results for input(s): CKTOTAL, CKMB, CKMBINDEX, TROPONINI in the last 168 hours.  BNP: Invalid input(s): POCBNP  CBG: Recent Labs  Lab 05/12/19 1350  GLUCAP 92    Microbiology: No results found for this or any previous visit.  Coagulation Studies: Recent Labs    05/12/19 1406  LABPROT 12.8  INR 1.0    Urinalysis: No results for input(s): COLORURINE, LABSPEC, PHURINE, GLUCOSEU, HGBUR, BILIRUBINUR, KETONESUR, PROTEINUR, UROBILINOGEN, NITRITE, LEUKOCYTESUR in the last 168  hours.  Invalid input(s): APPERANCEUR  Lipid Panel: No results found for: CHOL, TRIG, HDL, CHOLHDL, VLDL, LDLCALC  HgbA1C: No results found for: HGBA1C  Urine Drug Screen:  No results found for: LABOPIA, COCAINSCRNUR, LABBENZ, AMPHETMU, THCU, LABBARB  Alcohol Level: No results for input(s): ETH in the last 168 hours.  Other results: EKG: sinus rhythm at 80 bpm.  Imaging: CT HEAD CODE STROKE WO CONTRAST  Addendum Date: 05/12/2019   ADDENDUM REPORT: 05/12/2019 14:17 ADDENDUM: Study discussed by telephone with Dr. Arta Silence on 05/12/2019 at 1407 hours. Electronically Signed   By: Genevie Ann M.D.   On: 05/12/2019 14:17   Result Date: 05/12/2019 CLINICAL DATA:  Code stroke. 79 year old female with sudden onset slurred speech. EXAM: CT HEAD WITHOUT CONTRAST TECHNIQUE: Contiguous axial images were obtained from the base of the skull through the vertex without intravenous contrast. COMPARISON:  None. FINDINGS: Brain: Normal cerebral volume. No midline shift, mass effect, or evidence of intracranial mass lesion. No ventriculomegaly. No acute intracranial hemorrhage identified. There is a small hypodensity in the right thalamus on series 2, image 15. This is age indeterminate. Minimal for age scattered white matter hypodensity elsewhere. No cortically based acute infarct identified. Vascular: Calcified atherosclerosis at the skull base. No suspicious intracranial vascular hyperdensity. Skull: Negative. Sinuses/Orbits: Mild mucosal thickening in the sphenoid sinuses,  mild bubbly opacity on the left. Other Visualized paranasal sinuses and mastoids are stable and well pneumatized. Other: Visualized orbits and scalp soft tissues are within normal limits. ASPECTS [facility] Stroke Program Early CT Score) Total score (0-10 with 10 being normal): 10 IMPRESSION: 1. 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 Query acute left side weakness. 2. No superimposed acute cortically based infarct or acute  intracranial hemorrhage identified. 3.  ASPECTS 10. Electronically Signed: By: Genevie Ann M.D. On: 05/12/2019 14:05    Assessment: 79 y.o. female with a history of HTN and HLD who presents with aphasia.  Had earlier symptoms of headache and right visual field loss.  Concern is for multiple vascular territories.  Although NIHSS is low at one, speech is providing a disability for patient.  Contraindications to tPA reviewed.  Risks and benefits to tPA discussed with patient and husband.  Verbal consent obtained.  Head CT personally reviewed and shows no acute changes.  Age indeterminant right thalamic lacune noted.  On ASA and Plavix at home.    Stroke Risk Factors - hyperlipidemia, hypertension and smoking  Plan: 1. HgbA1c, fasting lipid panel 2. MRI, MRA  of the brain without contrast 3. PT consult, OT consult, Speech consult 4. Echocardiogram 5. Carotid dopplers 6. Prophylactic therapy-None 7. NPO until RN stroke swallow screen 8. Telemetry monitoring 9. Frequent neuro checks 10. Admit to ICU 11. Smoking cessation counseling.     Alexis Goodell, MD Neurology [phone] 05/12/2019, 2:36 PM  Addendum: Patient with return of headache about 20 minutes after initiation of infusion with increased severity.  Infusion stopped and patient sent for repeat head CT.  Repeat head CT personally reviewed and shows no change from previous study.  Infusion reinitiated and patient given Tylenol.    This patient is critically ill and at significant risk of neurological worsening, death and care requires constant monitoring of vital signs, hemodynamics,respiratory and cardiac monitoring, neurological assessment, discussion with family, other specialists and medical decision making of high complexity. I spent 70 minutes of neurocritical care time  in the care of  this patient.  Alexis Goodell, MD Neurology [phone] 05/12/2019  3:30 PM

## 2019-05-12 NOTE — Progress Notes (Signed)
CODE STROKE- PHARMACY COMMUNICATION   Time CODE STROKE called/page received: 1406  Time response to CODE STROKE was made (in person or via phone): 1415  Time Stroke Kit retrieved from [place] (only if needed): 1415  Name of Provider/Nurse contacted: Dr. Kirke Corin ,PharmD Clinical Pharmacist  05/12/2019  2:49 PM

## 2019-05-12 NOTE — ED Provider Notes (Signed)
[facility] Emergency Department Provider Note ____________________________________________   First MD Initiated Contact with Patient 05/12/19 1359     (approximate)  I have reviewed the triage vital signs and the nursing notes.   HISTORY  Chief Complaint code stroke  Level 5 caveat: History of present illness limited due to aphasia  HPI Jenna [name] is a 79 y.o. female with PMH as noted below who presents with acute onset of aphasia approximately an hour prior to coming into the hospital, intermittently, and preceded by headache which is now resolved.  Patient denies any weakness or numbness.  She has had no previous history of this.   Past Medical History:  Diagnosis Date  . Gout   . Hyperlipidemia   . Hypertension     Patient Active Problem List   Diagnosis Date Noted  . CVA (cerebral vascular accident) ([place]) 05/12/2019    Past Surgical History:  Procedure Laterality Date  . BREAST BIOPSY Right 1978   neg  . CHOLECYSTECTOMY      Prior to Admission medications   Medication Sig Start Date End Date Taking? Authorizing Provider  allopurinol (ZYLOPRIM) 100 MG tablet Take 100 mg by mouth 2 (two) times daily. 04/03/19   [provider]  Efinaconazole (JUBLIA) 10 % SOLN Apply 1 drop topically 1 day or 1 dose. 11/01/13   Hyatt, Max T, DPM  fenofibrate 160 MG tablet Take 160 mg by mouth daily. 04/17/19   [provider]  furosemide (LASIX) 20 MG tablet Take 20 mg by mouth daily. 05/08/19   [provider]  levothyroxine (SYNTHROID) 50 MCG tablet Take 50 mcg by mouth daily. 04/30/19   [provider]  Olmesartan Medoxomil (BENICAR PO) Take by mouth.    [provider]  Rosuvastatin Calcium (CRESTOR PO) Take by mouth.    [provider]    Allergies Patient has no known allergies.  Family History  Problem Relation Age of Onset  . Breast cancer Maternal Grandmother 97  . Breast cancer Cousin       Social History Social History   Tobacco Use  . Smoking status: Current Every Day Smoker    Packs/day: 0.50    Types: Cigarettes  . Smokeless tobacco: Never Used  Substance Use Topics  . Alcohol use: No  . Drug use: No    Review of Systems Level 5 caveat: Unable to obtain review of systems due to aphasia    ____________________________________________   PHYSICAL EXAM:  VITAL SIGNS: ED Triage Vitals  Enc Vitals Group     BP 05/12/19 1350 (!) 153/83     Pulse Rate 05/12/19 1350 81     Resp 05/12/19 1350 18     Temp 05/12/19 1350 98.3 F (36.8 C)     Temp src --      SpO2 05/12/19 1350 99 %     Weight 05/12/19 1402 173 lb (78.5 kg)     Height 05/12/19 1402 5\' 4"  (1.626 m)     Head Circumference --      Peak Flow --      Pain Score 05/12/19 1402 0     Pain Loc --      Pain Edu? --      Excl. in [place]? --     Constitutional: Alert and oriented.  Relatively well appearing. Eyes: Conjunctivae are normal.  EOMI.  PERRLA.  No neglect. Head: Atraumatic. Nose: No congestion/rhinnorhea. Mouth/Throat: Mucous membranes are moist.   Neck: Normal range  of motion.  Cardiovascular: Normal rate, regular rhythm.  Good peripheral circulation. Respiratory: Normal respiratory effort.  No retractions.  Gastrointestinal:  No distention.  Musculoskeletal: Extremities warm and well perfused.  Neurologic: Expressive aphasia intermittently.  Motor and sensory intact in all extremities with possible very mild drift to the right upper extremity.  No ataxia. Skin:  Skin is warm and dry. No rash noted. Psychiatric: Calm and cooperative.  ____________________________________________   LABS (all labs ordered are listed, but only abnormal results are displayed)  Labs Reviewed  COMPREHENSIVE METABOLIC PANEL - Abnormal; Notable for the following components:      Result Value   Glucose, Bld 102 (*)    BUN 27 (*)    Creatinine, Ser 1.15 (*)    GFR calc non Af Amer 46 (*)    GFR calc Af  Amer 54 (*)    All other components within normal limits  CULTURE, BLOOD (ROUTINE X 2)  CULTURE, BLOOD (ROUTINE X 2)  RESPIRATORY PANEL BY RT PCR (FLU A&B, COVID)  PROTIME-INR  APTT  CBC  DIFFERENTIAL  GLUCOSE, CAPILLARY  CBC  CREATININE, SERUM  LACTIC ACID, PLASMA  LACTIC ACID, PLASMA  CORTISOL  PROCALCITONIN  BRAIN NATRIURETIC PEPTIDE  PROTIME-INR  BLOOD GAS, ARTERIAL  CBG MONITORING, ED   ____________________________________________  EKG  ED ECG REPORT I, Arta Silence, the attending physician, personally viewed and interpreted this ECG.  Date: 05/12/2019 EKG Time: 1404 Rate: 80 Rhythm: normal sinus rhythm QRS Axis: normal Intervals: normal ST/T Wave abnormalities: normal Narrative Interpretation: no evidence of acute ischemia  ____________________________________________  RADIOLOGY  CT head: Age-indeterminate lacunar infarct in the right thalamus; no other acute findings  ____________________________________________   PROCEDURES  Procedure(s) performed: No  Procedures  Critical Care performed: Yes  CRITICAL CARE Performed by: Arta Silence   Total critical care time: 45 minutes  Critical care time was exclusive of separately billable procedures and treating other patients.  Critical care was necessary to treat or prevent imminent or life-threatening deterioration.  Critical care was time spent personally by me on the following activities: development of treatment plan with patient and/or surrogate as well as nursing, discussions with consultants, evaluation of patient's response to treatment, examination of patient, obtaining history from patient or surrogate, ordering and performing treatments and interventions, ordering and review of laboratory studies, ordering and review of radiographic studies, pulse oximetry and re-evaluation of patient's condition. ____________________________________________   INITIAL IMPRESSION / ASSESSMENT  AND PLAN / ED COURSE  Pertinent labs & imaging results that were available during my care of the patient were reviewed by me and considered in my medical decision making (see chart for details).  79 year old female with PMH as noted above presents with acute onset of expressive aphasia approximately an hour prior to coming to the hospital.  It was preceded by headache which is now resolved.  She denies any other acute neurologic symptoms.  On exam, the patient has normal vital signs and otherwise no focal neurologic findings.  The patient is somewhat intermittent.  Code stroke was activated.  CT was obtained which showed an age-indeterminate lacunar infarct but no acute findings.  Dr. Doy Mince evaluated the patient in the ED and recommended TPA, which was administered.  ----------------------------------------- 3:44 PM on 05/12/2019 -----------------------------------------  The patient is stable at this time and doing well.  She had a recurrence of the headache, so a second CT was obtained which showed no new findings.  tPA was completed.  Dr. Doy Mince discussed the case with  Dr. Lanney Gins up from the ICU for admission.  ________________________  [name] was evaluated in Emergency Department on 05/12/2019 for the symptoms described in the history of present illness. She was evaluated in the context of the global COVID-19 pandemic, which necessitated consideration that the patient might be at risk for infection with the SARS-CoV-2 virus that causes COVID-19. Institutional protocols and algorithms that pertain to the evaluation of patients at risk for COVID-19 are in a state of rapid change based on information released by regulatory bodies including the CDC and federal and state organizations. These policies and algorithms were followed during the patient's care in the ED.  ____________________________________________   FINAL CLINICAL IMPRESSION(S) / ED DIAGNOSES  Final diagnoses:    Acute CVA (cerebrovascular accident) ([place])      NEW MEDICATIONS STARTED DURING THIS VISIT:  New Prescriptions   No medications on file     Note:  This document was prepared using Dragon voice recognition software and may include unintentional dictation errors.   Arta Silence, MD 05/12/19 [phone]

## 2019-05-12 NOTE — ED Notes (Addendum)
CT called and notified of CODE Stroke. CT states bring pt to room 1

## 2019-05-12 NOTE — ED Notes (Signed)
Dr. Doy Mince discussing with patient and husband risk and benefits of tPa administration.  Husband and patient both verbalize understanding and give verbal consent to receive tPa.

## 2019-05-12 NOTE — ED Notes (Addendum)
Pt transported to ED room 7 per ED Charge Rand.

## 2019-05-12 NOTE — ED Notes (Addendum)
RN Anderson Malta, RN Janett Billow and MD Siadecki at bedside. Updated on pt's status

## 2019-05-12 NOTE — ED Notes (Signed)
  Charge [employer] and informed of CODE STROKE

## 2019-05-12 NOTE — ED Triage Notes (Addendum)
LKW-12:43. Symptoms started about hour ago. No blood thinners, slurred speech and finding words.  Pt is alert and all motor function seems to be intact per husband.

## 2019-05-12 NOTE — ED Notes (Signed)
Dr. Doy Mince to room for neuro assessment.

## 2019-05-12 NOTE — ED Notes (Addendum)
Pt's husband now able to check phone and states he  received call from kids stating that pt was not feeling well. Husband received call at about 12:30.  Husband also states pt has had both covid vaccines

## 2019-05-13 ENCOUNTER — Inpatient Hospital Stay: Payer: Medicare HMO

## 2019-05-13 ENCOUNTER — Inpatient Hospital Stay (HOSPITAL_COMMUNITY)
Admit: 2019-05-13 | Discharge: 2019-05-13 | Disposition: A | Discharge status: Home / Self Care | Payer: Medicare HMO | Attending: Neurology | Admitting: Neurology

## 2019-05-13 DIAGNOSIS — I639 Cerebral infarction, unspecified: Secondary | ICD-10-CM

## 2019-05-13 DIAGNOSIS — I10 Essential (primary) hypertension: Secondary | ICD-10-CM

## 2019-05-13 LAB — BLOOD GAS, ARTERIAL
Acid-base deficit: 0.1 mmol/L (ref 0.0–2.0)
Bicarbonate: 23.9 mmol/L (ref 20.0–28.0)
FIO2: 0.21
O2 Saturation: 95.5 %
Patient temperature: 37
pCO2 arterial: 36 mmHg (ref 32.0–48.0)
pH, Arterial: 7.43 (ref 7.350–7.450)
pO2, Arterial: 76 mmHg — ABNORMAL LOW (ref 83.0–108.0)

## 2019-05-13 LAB — MAGNESIUM: Magnesium: 2.2 mg/dL (ref 1.7–2.4)

## 2019-05-13 LAB — CBC
HCT: 35.8 % — ABNORMAL LOW (ref 36.0–46.0)
Hemoglobin: 11.8 g/dL — ABNORMAL LOW (ref 12.0–15.0)
MCH: 30.5 pg (ref 26.0–34.0)
MCHC: 33 g/dL (ref 30.0–36.0)
MCV: 92.5 fL (ref 80.0–100.0)
Platelets: 218 10*3/uL (ref 150–400)
RBC: 3.87 MIL/uL (ref 3.87–5.11)
RDW: 13.9 % (ref 11.5–15.5)
WBC: 7 10*3/uL (ref 4.0–10.5)
nRBC: 0 % (ref 0.0–0.2)

## 2019-05-13 LAB — LIPID PANEL
Cholesterol: 178 mg/dL (ref 0–200)
HDL: 28 mg/dL — ABNORMAL LOW (ref >40)
LDL Cholesterol: 103 mg/dL — ABNORMAL HIGH (ref 0–99)
Total CHOL/HDL Ratio: 6.4 RATIO
Triglycerides: 236 mg/dL — ABNORMAL HIGH (ref <150)
VLDL: 47 mg/dL — ABNORMAL HIGH (ref 0–40)

## 2019-05-13 LAB — BASIC METABOLIC PANEL
Anion gap: 8 (ref 5–15)
BUN: 24 mg/dL — ABNORMAL HIGH (ref 8–23)
CO2: 22 mmol/L (ref 22–32)
Calcium: 8.6 mg/dL — ABNORMAL LOW (ref 8.9–10.3)
Chloride: 108 mmol/L (ref 98–111)
Creatinine, Ser: 1.06 mg/dL — ABNORMAL HIGH (ref 0.44–1.00)
GFR calc Af Amer: 59 mL/min — ABNORMAL LOW (ref >60)
GFR calc non Af Amer: 51 mL/min — ABNORMAL LOW (ref >60)
Glucose, Bld: 98 mg/dL (ref 70–99)
Potassium: 3.8 mmol/L (ref 3.5–5.1)
Sodium: 138 mmol/L (ref 135–145)

## 2019-05-13 LAB — HEMOGLOBIN A1C
Hgb A1c MFr Bld: 5.6 % (ref 4.8–5.6)
Mean Plasma Glucose: 114 mg/dL

## 2019-05-13 LAB — ECHOCARDIOGRAM COMPLETE
Height: 65 in
Weight: 2814.83 oz

## 2019-05-13 MED ORDER — LEVOTHYROXINE SODIUM 50 MCG PO TABS
50.0000 ug | ORAL_TABLET | Freq: Every day | ORAL | Status: DC
  Administered 2019-05-13 – 2019-05-14 (×2): 50 ug via ORAL
  Filled 2019-05-13 (×2): qty 1

## 2019-05-13 MED ORDER — ASPIRIN EC 81 MG PO TBEC
81.0000 mg | DELAYED_RELEASE_TABLET | Freq: Every day | ORAL | Status: DC
  Administered 2019-05-13: 11:00:00 81 mg via ORAL
  Filled 2019-05-13: qty 1

## 2019-05-13 MED ORDER — ROSUVASTATIN CALCIUM 10 MG PO TABS
40.0000 mg | ORAL_TABLET | Freq: Every day | ORAL | Status: DC
  Administered 2019-05-13: 18:00:00 40 mg via ORAL
  Filled 2019-05-13: qty 4

## 2019-05-13 MED ORDER — FAMOTIDINE 20 MG PO TABS
20.0000 mg | ORAL_TABLET | Freq: Two times a day (BID) | ORAL | Status: DC
  Administered 2019-05-13 – 2019-05-14 (×3): 20 mg via ORAL
  Filled 2019-05-13 (×3): qty 1

## 2019-05-13 NOTE — Evaluation (Signed)
Physical Therapy Evaluation Patient Details Name: Jenna [name] MRN: [id number] DOB: February 28, 1941 Today's Date: 05/13/2019   History of Present Illness  Pt admitted for L temparopateial lobe CVA and R parietal lobe CVA and is now s/p TPA intervention. Repeat CT scan negative and cleared to work with pt via Conservation officer, historic buildings.   Clinical Impression  Pt is a pleasant 79 year old female who was admitted for CVA s/p TPA. Pt performs bed mobility/transfers with independence and ambulation with supervision without AD. Pt ambulated to bathroom and in hallway. Needs cues for safety and is impulsive. Pt demonstrates deficits with safety/endurance. No sensation/coordination deficits noted tested by RAMPs. Pt A&O with clear speech and is very close to baseline level. Would benefit from skilled PT to address above deficits and promote optimal return to PLOF.     Follow Up Recommendations Outpatient PT(prefers to use [facility] outpatient rehab)    Equipment Recommendations  None recommended by PT    Recommendations for Other Services       Precautions / Restrictions Precautions Precautions: Fall Restrictions Weight Bearing Restrictions: No      Mobility  Bed Mobility Overal bed mobility: Independent             General bed mobility comments: slightly impulsive and needs cues for safety due to multiple line/leads attached  Transfers Overall transfer level: Independent               General transfer comment: needs cues for safety as pt slightly impulsive. Stands quickly without LOB or dizziness noted.  Ambulation/Gait Ambulation/Gait assistance: Supervision Gait Distance (Feet): 100 Feet Assistive device: None Gait Pattern/deviations: Step-through pattern     General Gait Details: ambulated in room/hallway with slightly impulsive nature. Needs cues for safety. Reciprocal gait pattern performed and no LOB noted. Vitals remained stable throughout  Stairs            Wheelchair  Mobility    Modified Rankin (Stroke Patients Only)       Balance Overall balance assessment: Independent                                           Pertinent Vitals/Pain Pain Assessment: No/denies pain    Home Living Family/patient expects to be discharged to:: Private residence Living Arrangements: Spouse/significant other Available Help at Discharge: Family Type of Home: House Home Access: Stairs to enter Entrance Stairs-Rails: Right Entrance Stairs-Number of Steps: 2 Home Layout: Able to live on main level with bedroom/bathroom Home Equipment: Walker - 2 wheels;Cane - single point      Prior Function Level of Independence: Independent         Comments: occassionally will use SPC, however typically independent, drives. Reports 1 fall in last year at [employer].     Hand Dominance        Extremity/Trunk Assessment   Upper Extremity Assessment Upper Extremity Assessment: Overall WFL for tasks assessed    Lower Extremity Assessment Lower Extremity Assessment: Overall WFL for tasks assessed       Communication   Communication: No difficulties  Cognition Arousal/Alertness: Awake/alert Behavior During Therapy: Impulsive Overall Cognitive Status: Within Functional Limits for tasks assessed  General Comments      Exercises Other Exercises Other Exercises: ambulated to bathroom. Needs cga for set up due to line/leads. Able to perform transfer/wash hands with supervision at sink.    Assessment/Plan    PT Assessment Patient needs continued PT services  PT Problem List Decreased activity tolerance;Decreased safety awareness       PT Treatment Interventions DME instruction;Stair training;Balance training    PT Goals (Current goals can be found in the Care Plan section)  Acute Rehab PT Goals Patient Stated Goal: to go home PT Goal Formulation: With patient Time For Goal  Achievement: 05/27/19 Potential to Achieve Goals: Good    Frequency 7X/week   Barriers to discharge        Co-evaluation               AM-PAC PT "6 Clicks" Mobility  Outcome Measure Help needed turning from your back to your side while in a flat bed without using bedrails?: None Help needed moving from lying on your back to sitting on the side of a flat bed without using bedrails?: None Help needed moving to and from a bed to a chair (including a wheelchair)?: None Help needed standing up from a chair using your arms (e.g., wheelchair or bedside chair)?: None Help needed to walk in hospital room?: None Help needed climbing 3-5 steps with a railing? : None 6 Click Score: 24    End of Session Equipment Utilized During Treatment: Gait belt Activity Tolerance: Patient tolerated treatment well Patient left: in bed Nurse Communication: Mobility status PT Visit Diagnosis: Unsteadiness on feet (R26.81)    Time: 1859-0931 PT Time Calculation (min) (ACUTE ONLY): 42 min   Charges:   PT Evaluation $PT Eval Low Complexity: 1 Low PT Treatments $Gait Training: 8-22 mins $Therapeutic Exercise: 8-22 mins        Greggory Stallion, PT, DPT [phone]   Jenna [name] 05/13/2019, 2:45 PM

## 2019-05-13 NOTE — Evaluation (Signed)
Speech Language Pathology Evaluation Patient Details Name: Jenna [name] MRN: [id number] DOB: [birth year] Today's Date: 05/13/2019 Time: 1093-2355 SLP Time Calculation (min) (ACUTE ONLY): 65 min  Problem List:  Patient Active Problem List   Diagnosis Date Noted  . CVA (cerebral vascular accident) ([place]) 05/12/2019  . Stroke (cerebrum) ([place]) 05/12/2019   Past Medical History:  Past Medical History:  Diagnosis Date  . Gout   . Hyperlipidemia   . Hypertension   . Thyroid disease    Past Surgical History:  Past Surgical History:  Procedure Laterality Date  . BREAST BIOPSY Right 1978   neg  . CHOLECYSTECTOMY     HPI:  Pt is a pleasant 79 year old female with a history of HTN, Hyperlipidemia, tobacco abuse/use, osteoarthritis, chronic sciatica, history of left femoral fracture 4 years ago, who came in with signs and symptoms of aphasia suggestive of acute CVA.  She also had acute onset of left-sided headache with loss of right-sided visual field.  The symptoms lasted for short period of time.  In the ED patient was evaluated by neurologist and it was decided to administer TPA.  Pt has had improvement in her s/s since admission but Speech has not returned completely to its baseline.  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     Assessment / Plan / Recommendation Clinical Impression  Pt appears to present w/ Mild-Moderate Receptive and Expressive Aphasia impacting Higher level tasks and Conversation in her communication and conversation w/ others. Pt exhibited No immediate, overt Cognitive deficits. Motor Speech abilities c/b Mild hesistation at sentence level during conversation. Slowing rate and pausing intermittently appeared to aid. This may be related to her Expressive language deficits which were c/b semantic and phonemic Paraphasias; word finding deficits impacting naming; word/sentence  organization during repetition/speech tasks. Phonemic, semantic cues appeared helpful w/ word recall. Repetition of the word/sentence w/ feedback appeared to increase accuracy. More spontaneous speech and Automatic speech tasks were approoprate; WFL. During Receptive language tasks, pt exhibited deficits w/ Higher level language tasks including complex Y/N questions. Pt was able to adequately follow 2-3 step commands at bedside. 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 Encouraged pt and Husband to monitor toleration of demand of Tax work for fatigue, confusion w/ information. Gave encouragement to pt and Husband re: pt's current abilities and performance level; healing time is always ongoing. Pt and Husband gave agreement. Handouts given on general strategies for verbal communication w/ others.  Recommend pt f/u w/ Outpt skilled ST services for all further tx needs to improve communication skills and promote optimal return to PLOF.    SLP Assessment  SLP Recommendation/Assessment: All further Speech Lanaguage Pathology  needs can be addressed in the next venue of care SLP Visit Diagnosis: Aphasia (R47.01)    Follow Up Recommendations  Outpatient SLP    Frequency and Duration (TBD outpt)  (TBD outpt)      SLP Evaluation Cognition  Overall Cognitive Status: Within Functional Limits for tasks assessed Arousal/Alertness: Awake/alert Orientation Level: Oriented X4 Attention: Focused;Sustained Focused Attention: Appears intact Sustained Attention: Appears intact Memory: Appears intact Awareness: Appears intact Problem Solving: Appears intact Executive Function: Reasoning;Sequencing;Organizing Reasoning: Appears intact Sequencing: Appears intact Organizing: Appears  intact Behaviors: (slight/min anxious re: admit) Safety/Judgment: Appears intact Comments: grossly       Comprehension  Auditory Comprehension Overall Auditory Comprehension: Impaired Yes/No Questions: Impaired(higher level tasks) Commands: Within Functional Limits(2-3 step) Conversation: Complex(grossly) Other Conversation Comments: expressive defs Interfering Components: Motor planning;Processing speed EffectiveTechniques: Slowed speech;Repetition;Pausing;Extra processing time Visual Recognition/Discrimination Discrimination: Not tested Reading Comprehension Reading Status: Within funtional limits(menu used)    Expression Expression Primary Mode of Expression: Verbal Verbal Expression Overall Verbal Expression: Impaired Initiation: (adequate) Automatic Speech: Name;Social Response;Counting;Day of week Level of Generative/Spontaneous Verbalization: Conversation Repetition: Impaired Level of Impairment: Sentence level Naming: Impairment Responsive: 76-100% accurate Convergent: Not tested Divergent: Not tested Other Naming Comments: phonemic cuse helpful Verbal Errors: Semantic paraphasias;Phonemic paraphasias;Aware of errors Pragmatics: (adequate) Effective Techniques: Semantic cues;Sentence completion;Phonemic cues Non-Verbal Means of Communication: Not applicable Written Expression Dominant Hand: Right Written Expression: Not tested   Oral / Motor  Oral Motor/Sensory Function Overall Oral Motor/Sensory Function: Within functional limits Motor Speech Overall Motor Speech: Appears within functional limits for tasks assessed Respiration: Within functional limits Phonation: Normal Resonance: Within functional limits Articulation: Within functional limitis Intelligibility: Intelligible Motor Planning: Impaired Level of Impairment: Sentence Motor Speech Errors: Aware(slight hesitation) Effective Techniques: Slow rate;Pause   GO                      Orinda Kenner, MS, CCC-SLP Maelani Yarbro 05/13/2019, 3:08 PM

## 2019-05-13 NOTE — Progress Notes (Addendum)
OT Cancellation Note  Patient Details Name: Jenna [name] MRN: [id number] DOB: Dec 05, 1940   Cancelled Treatment:    Reason Eval/Treat Not Completed: Patient not medically ready  OT consult received and chart reviewed. Upon chart review this AM, it is noted that pt received tPA for acute CVA at 14:45 on 05/12/2019 with end time at 15:47. Per therapy guidelines, will hold pt until 24 hour window has passed from tPA administration and f/u imaging of head is available. Thank you.  Gerrianne Scale, [place], OTR/L ascom [phone] 05/13/19, 8:37 AM

## 2019-05-13 NOTE — Consult Note (Addendum)
Subjective: Much improved this AM. Still mild aphasia but better then yesterday. S/p TPA  Past Medical History:  Diagnosis Date  . Gout   . Hyperlipidemia   . Hypertension   . Thyroid disease     Past Surgical History:  Procedure Laterality Date  . BREAST BIOPSY Right 1978   neg  . CHOLECYSTECTOMY      Family History  Problem Relation Age of Onset  . Breast cancer Maternal Grandmother 22  . Breast cancer Cousin    Social History:  reports that she has been smoking cigarettes. She has been smoking about 0.50 packs per day. She has never used smokeless tobacco. She reports that she does not drink alcohol or use drugs.  Allergies: No Known Allergies  Medications: I have reviewed the patient's current medications. Prior to Admission:  Prior to Admission medications   Medication Sig Start Date End Date Taking? Authorizing Provider  ALLOPURINOL PO Take by mouth.    [provider]  Efinaconazole (JUBLIA) 10 % SOLN Apply 1 drop topically 1 day or 1 dose. 11/01/13   Hyatt, Max T, DPM  Olmesartan Medoxomil (BENICAR PO) Take by mouth.    [provider]  Rosuvastatin Calcium (CRESTOR PO) Take by mouth.    [provider]    Physical Examination: Blood pressure (!) 113/100, pulse 74, temperature 98.6 F (37 C), temperature source Oral, resp. rate 19, height 5\' 5"  (1.651 m), weight 79.8 kg, SpO2 95 %.  HEENT-  Normocephalic, no lesions, without obvious abnormality.  Normal external eye and conjunctiva.  Normal TM's bilaterally.  Normal auditory canals and external ears. Normal external nose, mucus membranes and septum.  Normal pharynx. Cardiovascular- S1, S2 normal, pulses palpable throughout   Lungs- chest clear, no wheezing, rales, normal symmetric air entry Abdomen- soft, non-tender; bowel sounds normal; no masses,  no organomegaly Extremities- LE edema Lymph-no adenopathy palpable Musculoskeletal-no joint tenderness, deformity or swelling Skin-warm and  dry, no hyperpigmentation, vitiligo, or suspicious lesions  Neurological Examination   Mental Status: Alert, oriented, thought content appropriate.  Expressive aphasia noted but improved.  Able to follow simple commands but requires reinforcement to follow 3-step commands. Cranial Nerves: II: Discs flat bilaterally; Visual fields grossly normal, pupils equal, round, reactive to light and accommodation III,IV, VI: ptosis not present, extra-ocular motions intact bilaterally V,VII: smile symmetric, facial light touch sensation normal bilaterally VIII: hearing normal bilaterally IX,X: gag reflex present XI: bilateral shoulder shrug XII: midline tongue extension Motor: Right : Upper extremity   5/5    Left:     Upper extremity   5/5  Lower extremity   5/5     Lower extremity   5/5 Tone and bulk:normal tone throughout; no atrophy noted Sensory: Pinprick and light touch intact throughout, bilaterally Deep Tendon Reflexes: Symmetric throughout Plantars: Right: mute   Left: mute Cerebellar: Normal finger-to-nose and normal heel-to-shin testing bilaterally Gait: not tested due to safety concerns    Laboratory Studies:  Basic Metabolic Panel: Recent Labs  Lab 05/12/19 1406 05/13/19 0449  NA 136 138  K 3.9 3.8  CL 103 108  CO2 24 22  GLUCOSE 102* 98  BUN 27* 24*  CREATININE 1.15* 1.06*  CALCIUM 9.3 8.6*  MG  --  2.2    Liver Function Tests: Recent Labs  Lab 05/12/19 1406  AST 16  ALT 10  ALKPHOS 44  BILITOT 0.5  PROT 7.5  ALBUMIN 4.4   No results for input(s): LIPASE, AMYLASE in the last 168 hours.  No results for input(s): AMMONIA in the last 168 hours.  CBC: Recent Labs  Lab 05/12/19 1406 05/13/19 0449  WBC 7.7 7.0  NEUTROABS 4.1  --   HGB 12.9 11.8*  HCT 40.1 35.8*  MCV 92.6 92.5  PLT 257 218    Cardiac Enzymes: No results for input(s): CKTOTAL, CKMB, CKMBINDEX, TROPONINI in the last 168 hours.  BNP: Invalid input(s): POCBNP  CBG: Recent Labs  Lab  05/12/19 1350 05/12/19 1649  GLUCAP 92 106*    Microbiology: Results for orders placed or performed during the hospital encounter of 05/12/19  Respiratory Panel by RT PCR (Flu A&B, Covid) - Nasopharyngeal Swab     Status: None   Collection Time: 05/12/19  3:29 PM   Specimen: Nasopharyngeal Swab  Result Value Ref Range Status   SARS Coronavirus 2 by RT PCR NEGATIVE NEGATIVE Final    Comment: (NOTE) SARS-CoV-2 target nucleic acids are NOT DETECTED. The SARS-CoV-2 RNA is generally detectable in upper respiratoy specimens during the acute phase of infection. The lowest concentration of SARS-CoV-2 viral copies this assay can detect is 131 copies/mL. A negative result does not preclude SARS-Cov-2 infection and should not be used as the sole basis for treatment or other patient management decisions. A negative result may occur with  improper specimen collection/handling, submission of specimen other than nasopharyngeal swab, presence of viral mutation(s) within the areas targeted by this assay, and inadequate number of viral copies (<131 copies/mL). A negative result must be combined with clinical observations, patient history, and epidemiological information. The expected result is Negative. Fact Sheet for Patients:  PinkCheek.be Fact Sheet for Healthcare Providers:  GravelBags.it This test is not yet ap proved or cleared by the [place] FDA and  has been authorized for detection and/or diagnosis of SARS-CoV-2 by FDA under an Emergency Use Authorization (EUA). This EUA will remain  in effect (meaning this test can be used) for the duration of the COVID-19 declaration under Section 564(b)(1) of the Act, 21 U.S.C. section 360bbb-3(b)(1), unless the authorization is terminated or revoked sooner.    Influenza A by PCR NEGATIVE NEGATIVE Final   Influenza B by PCR NEGATIVE NEGATIVE Final    Comment: (NOTE) The Xpert Xpress  SARS-CoV-2/FLU/RSV assay is intended as an aid in  the diagnosis of influenza from Nasopharyngeal swab specimens and  should not be used as a sole basis for treatment. Nasal washings and  aspirates are unacceptable for Xpert Xpress SARS-CoV-2/FLU/RSV  testing. Fact Sheet for Patients: PinkCheek.be Fact Sheet for Healthcare Providers: GravelBags.it This test is not yet approved or cleared by the [place] FDA and  has been authorized for detection and/or diagnosis of SARS-CoV-2 by  FDA under an Emergency Use Authorization (EUA). This EUA will remain  in effect (meaning this test can be used) for the duration of the  Covid-19 declaration under Section 564(b)(1) of the Act, 21  U.S.C. section 360bbb-3(b)(1), unless the authorization is  terminated or revoked. Performed at [facility], [address] [place], [place] [address]   MRSA PCR Screening     Status: None   Collection Time: 05/12/19  4:49 PM   Specimen: Nasopharyngeal  Result Value Ref Range Status   MRSA by PCR NEGATIVE NEGATIVE Final    Comment:        The GeneXpert MRSA Assay (FDA approved for NASAL specimens only), is one component of a comprehensive MRSA colonization surveillance program. It is not intended to diagnose MRSA infection nor to guide or monitor  treatment for MRSA infections. Performed at [facility], [address] [place], [place] [address]   Culture, blood (routine x 2)     Status: None (Preliminary result)   Collection Time: 05/12/19  4:50 PM   Specimen: BLOOD  Result Value Ref Range Status   Specimen Description BLOOD LEFT ANTECUBITAL  Final   Special Requests   Final    BOTTLES DRAWN AEROBIC AND ANAEROBIC Blood Culture results may not be optimal due to an inadequate volume of blood received in culture bottles   Culture   Final    NO GROWTH < 24 HOURS Performed at [facility], [address]  [place], [place] [address]    Report Status PENDING  Incomplete  Culture, blood (routine x 2)     Status: None (Preliminary result)   Collection Time: 05/12/19  4:50 PM   Specimen: BLOOD  Result Value Ref Range Status   Specimen Description BLOOD BLOOD LEFT FOREARM  Final   Special Requests   Final    BOTTLES DRAWN AEROBIC AND ANAEROBIC Blood Culture results may not be optimal due to an inadequate volume of blood received in culture bottles   Culture   Final    NO GROWTH < 24 HOURS Performed at [facility], [address] [place], [place] [address]    Report Status PENDING  Incomplete    Coagulation Studies: Recent Labs    05/12/19 1406 05/12/19 1650  LABPROT 12.8 13.2  INR 1.0 1.0    Urinalysis: No results for input(s): COLORURINE, LABSPEC, PHURINE, GLUCOSEU, HGBUR, BILIRUBINUR, KETONESUR, PROTEINUR, UROBILINOGEN, NITRITE, LEUKOCYTESUR in the last 168 hours.  Invalid input(s): APPERANCEUR  Lipid Panel:    Component Value Date/Time   CHOL 178 05/13/2019 0449   TRIG 236 (H) 05/13/2019 0449   HDL 28 (L) 05/13/2019 0449   CHOLHDL 6.4 05/13/2019 0449   VLDL 47 (H) 05/13/2019 0449   LDLCALC 103 (H) 05/13/2019 0449    HgbA1C: No results found for: HGBA1C  Urine Drug Screen:  No results found for: LABOPIA, COCAINSCRNUR, LABBENZ, AMPHETMU, THCU, LABBARB  Alcohol Level: No results for input(s): ETH in the last 168 hours.  Other results: EKG: sinus rhythm in the 80s  Imaging: CT HEAD WO CONTRAST  Result Date: 05/13/2019 CLINICAL DATA:  Stroke follow-up status post tPA. EXAM: CT HEAD WITHOUT CONTRAST TECHNIQUE: Contiguous axial images were obtained from the base of the skull through the vertex without intravenous contrast. COMPARISON:  Head CT and MRI 05/12/2019 FINDINGS: Brain: A 2 cm region of cortically based hypoattenuation in the left temporo-occipital region corresponds to an acute infarct on MRI. Other smaller acute infarcts on MRI are not well demonstrated by CT.  There is a background of mild chronic small vessel ischemia in the cerebral white matter with a chronic lacunar infarct again noted in the right thalamus. No intracranial hemorrhage, midline shift, or extra-axial fluid collection is identified. The ventricles and sulci are within normal limits for age. A small meningioma along the left anterior clinoid process is better demonstrated on MRI. Vascular: Calcified atherosclerosis at the skull base. No hyperdense vessel. Skull: No fracture or suspicious osseous lesion. Sinuses/Orbits: Mild left sphenoid sinus mucosal thickening. No significant mastoid fluid. Unremarkable orbits. Other: None. IMPRESSION: 1. Acute left temporo-occipital infarct with other smaller infarcts on MRI not well demonstrated by CT. 2. No intracranial hemorrhage. Electronically Signed   By: Logan Bores M.D.   On: 05/13/2019 10:16   CT HEAD WO CONTRAST  Result Date:  05/12/2019 CLINICAL DATA:  Stroke follow-up.  Worsening headache. EXAM: CT HEAD WITHOUT CONTRAST TECHNIQUE: Contiguous axial images were obtained from the base of the skull through the vertex without intravenous contrast. COMPARISON:  Earlier today FINDINGS: Brain: No evidence of acute cortical infarction, hemorrhage, hydrocephalus, extra-axial collection or mass lesion/mass effect. Right lacunar infarct at the thalamus as noted on prior. Vascular: Atherosclerotic calcification.  No hyperdense vessel. Skull: Normal. Negative for fracture or focal lesion. Sinuses/Orbits: Negative Other: These results were communicated to Reynolds at 3:27 pmon 2/7/2021by text page via the [facility] messaging system. IMPRESSION: 1. Stable head CT. 2. Lacunar infarct at the right thalamus. Electronically Signed   By: Monte Fantasia M.D.   On: 05/12/2019 15:28   MR BRAIN WO CONTRAST  Result Date: 05/12/2019 CLINICAL DATA:  Aphasia stroke presentation. EXAM: MRI HEAD WITHOUT CONTRAST TECHNIQUE: Multiplanar, multiecho pulse sequences of the brain and  surrounding structures were obtained without intravenous contrast. COMPARISON:  CT same day FINDINGS: Brain: Diffusion imaging shows a punctate acute infarction in the left cerebellum. There is a confluent 2 cm infarction at the left temporoparietal junction. Few other scattered punctate acute infarctions in the near by the left parietal lobe. Punctate acute infarction higher in the non contiguous left parietal lobe. Punctate acute infarction in the high right parietal lobe. This pattern of infarctions suggests embolic disease from the heart or ascending aorta. No evidence of mass effect or hemorrhage. Elsewhere, there are mild chronic small-vessel ischemic changes of the pons. Old lacunar infarction right thalamus. Mild chronic small-vessel change affects the cerebral hemispheric white matter. There is a small meningioma along the clinoid process on the left, no larger than 11 mm. No mass-effect upon the brain. No hemorrhage, hydrocephalus or extra-axial collection. Vascular: Major vessels at the base of the brain show flow. Skull and upper cervical spine: Negative Sinuses/Orbits: Mucosal inflammatory change of the left division of the sphenoid sinus. Other sinuses are clear. Orbits negative. 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 Most extensive area of involvement is in the left temporoparietal junction where there is a confluent 2 cm infarction. Other smaller infarctions seen in the left temporoparietal junction region and left parietal lobe. Punctate acute infarction present in the right parietal lobe. No evidence of hemorrhage. Background pattern of mild chronic small-vessel disease elsewhere as described above. 11 mm meningioma of the clinoid process on the left without mass-effect upon the brain. Electronically Signed   By: Nelson Chimes M.D.   On: 05/12/2019 23:56   US Carotid Bilateral (at [facility] and [facility] only)  Result Date:  05/13/2019 CLINICAL DATA:  79 year old female with recent stroke (multifocal acute infarctions consistent with embolic disease). EXAM: BILATERAL CAROTID DUPLEX ULTRASOUND TECHNIQUE: Pearline Cables scale imaging, color Doppler and duplex ultrasound were performed of bilateral carotid and vertebral arteries in the neck. COMPARISON:  MRI brain 05/12/2019 FINDINGS: Criteria: Quantification of carotid stenosis is based on velocity parameters that correlate the residual internal carotid diameter with NASCET-based stenosis levels, using the diameter of the distal internal carotid lumen as the denominator for stenosis measurement. The following velocity measurements were obtained: RIGHT ICA: 50/18 cm/sec CCA: 06/23 cm/sec SYSTOLIC ICA/CCA RATIO:  0.7 ECA:  53 cm/sec LEFT ICA: 65/21 cm/sec CCA: 76/28 cm/sec SYSTOLIC ICA/CCA RATIO:  1.0 ECA:  96 cm/sec RIGHT CAROTID ARTERY: No significant atherosclerotic plaque or evidence of stenosis. RIGHT VERTEBRAL ARTERY:  Patent with antegrade flow. LEFT CAROTID ARTERY: Mild smooth heterogeneous atherosclerotic plaque in the  proximal internal carotid artery. By peak systolic velocity criteria, the estimated stenosis is less than 50%. LEFT VERTEBRAL ARTERY:  Patent with normal antegrade flow. IMPRESSION: 1. Mild (1-49%) stenosis proximal left internal carotid artery secondary to mild smooth heterogeneous atherosclerotic plaque. 2. No significant atherosclerotic plaque or evidence of stenosis in the right internal carotid artery. 3. Vertebral arteries are patent with normal antegrade flow. Signed, Criselda Peaches, MD, [facility] Electronically Signed   By: Jacqulynn Cadet M.D.   On: 05/13/2019 10:49   DG Chest Port 1 View  Result Date: 05/13/2019 CLINICAL DATA:  79 year old female with code stroke presentation and brain MRI revealing scattered small probable embolic cerebral and cerebellar infarcts. EXAM: PORTABLE CHEST 1 VIEW  COMPARISON:  Chest radiographs 12/21/2005. FINDINGS: Portable AP semi upright view at 0412 hours. Cardiomegaly is new since 2007. Other mediastinal contours are within normal limits. Stable lung volumes. Chronic right upper lobe calcified granuloma, benign. Elsewhere when allowing for portable technique the lungs are clear. Visualized tracheal air column is within normal limits. No pneumothorax or pleural effusion. No acute osseous abnormality identified. IMPRESSION: 1. Cardiomegaly suspected and new since 2007. 2. No acute pulmonary abnormality. Electronically Signed   By: Genevie Ann M.D.   On: 05/13/2019 05:37   CT HEAD CODE STROKE WO CONTRAST  Addendum Date: 05/12/2019   ADDENDUM REPORT: 05/12/2019 14:17 ADDENDUM: Study discussed by telephone with Dr. Arta Silence on 05/12/2019 at 1407 hours. Electronically Signed   By: Genevie Ann M.D.   On: 05/12/2019 14:17   Result Date: 05/12/2019 CLINICAL DATA:  Code stroke. 79 year old female with sudden onset slurred speech. EXAM: CT HEAD WITHOUT CONTRAST TECHNIQUE: Contiguous axial images were obtained from the base of the skull through the vertex without intravenous contrast. COMPARISON:  None. FINDINGS: Brain: Normal cerebral volume. No midline shift, mass effect, or evidence of intracranial mass lesion. No ventriculomegaly. No acute intracranial hemorrhage identified. There is a small hypodensity in the right thalamus on series 2, image 15. This is age indeterminate. Minimal for age scattered white matter hypodensity elsewhere. No cortically based acute infarct identified. Vascular: Calcified atherosclerosis at the skull base. No suspicious intracranial vascular hyperdensity. Skull: Negative. Sinuses/Orbits: Mild mucosal thickening in the sphenoid sinuses, mild bubbly opacity on the left. Other Visualized paranasal sinuses and mastoids are stable and well pneumatized. Other: Visualized orbits and scalp soft tissues are within normal limits. ASPECTS [facility] Stroke  Program Early CT Score) Total score (0-10 with 10 being normal): 10 IMPRESSION: 1. 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 Query acute left side weakness. 2. No superimposed acute cortically based infarct or acute intracranial hemorrhage identified. 3.  ASPECTS 10. Electronically Signed: By: Genevie Ann M.D. On: 05/12/2019 14:05    Assessment: 79 y.o. female with a history of HTN and HLD who presents with aphasia.  Had earlier symptoms of headache and right visual field loss.  Concern is for multiple vascular territories.  Although NIHSS is low at one, speech is providing a disability for patient.    - Patient is s/p TPA - Speech improved but still periods of aphasia - MRI with embolic looking strokes on L side more then R  - Home medications state ASA 81 at home but states does not take it daily  - Carotid US without any significant stenosis  - CTH today this AM even though not a 24 hr scan. No signs of hemorrhagic conversion  - Can be transferred to floor,  start ASA 325 daily and heparin sq  - echo done. If TTE echo is negative would consider TEE to look for embolic source.

## 2019-05-13 NOTE — Progress Notes (Signed)
*  PRELIMINARY RESULTS* Echocardiogram 2D Echocardiogram has been performed. Bubble study has been performed.  Jenna [name] 05/13/2019, 9:19 AM

## 2019-05-13 NOTE — Progress Notes (Signed)
CRITICAL CARE PROGRESS NOTE    Name: Jenna [name] MRN: [id number] DOB: 1940/09/25     LOS: 1   SUBJECTIVE FINDINGS & SIGNIFICANT EVENTS   Patient description:  This is a pleasant 79 year old female with a history of osteoarthritis, chronic sciatica, history of left femoral fracture 4 years ago, who came in with signs and symptoms of aphasia suggestive of acute CVA.  She also had acute onset of left-sided headache with loss of right-sided visual field.  The symptoms lasted for short period of time.  In the ED patient was evaluated by neurologist and it was decided to administer TPA.  Critical care consultation was placed by ED physician for further evaluation and management in the intensive care unit.   Lines / Drains: PIV x2  Cultures / Sepsis markers: Respiratory and blood  Antibiotics: None   Protocols / Consultants: Neurology status post TPA   PAST MEDICAL HISTORY   Past Medical History:  Diagnosis Date  . Gout   . Hyperlipidemia   . Hypertension   . Thyroid disease      SURGICAL HISTORY   Past Surgical History:  Procedure Laterality Date  . BREAST BIOPSY Right 1978   neg  . CHOLECYSTECTOMY       FAMILY HISTORY   Family History  Problem Relation Age of Onset  . Breast cancer Maternal Grandmother 68  . Breast cancer Cousin      SOCIAL HISTORY   Social History   Tobacco Use  . Smoking status: Current Every Day Smoker    Packs/day: 0.50    Types: Cigarettes  . Smokeless tobacco: Never Used  Substance Use Topics  . Alcohol use: No  . Drug use: No     MEDICATIONS   Current Medication:  Current Facility-Administered Medications:  .   stroke: mapping our early stages of recovery book, , Does not apply, Once, Alexis Goodell, MD .  0.9 %  sodium chloride  infusion, 250 mL, Intravenous, PRN, Lanney Gins, Brownie Gockel, MD .  0.9 %  sodium chloride infusion, , Intravenous, Continuous, Alexis Goodell, MD, Last Rate: 50 mL/hr at 05/13/19 0800, Rate Verify at 05/13/19 0800 .  acetaminophen (TYLENOL) tablet 650 mg, 650 mg, Oral, Q4H PRN **OR** acetaminophen (TYLENOL) 160 MG/5ML solution 650 mg, 650 mg, Per Tube, Q4H PRN **OR** acetaminophen (TYLENOL) suppository 650 mg, 650 mg, Rectal, Q4H PRN, Alexis Goodell, MD .  Chlorhexidine Gluconate Cloth 2 % PADS 6 each, 6 each, Topical, Daily, Ottie Glazier, MD, 6 each at 05/12/19 1647 .  labetalol (NORMODYNE) injection 20 mg, 20 mg, Intravenous, Once **AND** clevidipine (CLEVIPREX) infusion 0.5 mg/mL, 0-21 mg/hr, Intravenous, Continuous, Alexis Goodell, MD .  famotidine (PEPCID) tablet 20 mg, 20 mg, Oral, BID, Charlett Nose, RPH .  heparin injection 5,000 Units, 5,000 Units, Subcutaneous, Q8H, Imya Mance, MD .  ondansetron (ZOFRAN) injection 4 mg, 4 mg, Intravenous, Q6H PRN, Lanney Gins, Desyre Calma, MD .  pantoprazole (PROTONIX) injection 40 mg, 40 mg, Intravenous, QHS, Alexis Goodell, MD, 40 mg at 05/12/19 2110 .  senna-docusate (Senokot-S) tablet 1 tablet, 1 tablet, Oral, QHS PRN, Alexis Goodell, MD .  sodium chloride flush (NS) 0.9 % injection 3 mL, 3 mL, Intravenous, Once, Arta Silence, MD .  sodium chloride flush (NS) 0.9 % injection 3 mL, 3 mL, Intravenous, Q12H, Ottie Glazier, MD, 3 mL at 05/12/19 2114 .  sodium chloride flush (NS) 0.9 % injection 3 mL, 3 mL, Intravenous, PRN, Ottie Glazier, MD    ALLERGIES   Patient has no  known allergies.    REVIEW OF SYSTEMS    10 point ROS done and is negative except aphasia  PHYSICAL EXAMINATION   Vital Signs: Temp:  [98 F (36.7 C)-98.6 F (37 C)] 98.6 F (37 C) (02/08 0800) Pulse Rate:  [68-81] 74 (02/08 0900) Resp:  [14-25] 19 (02/08 0900) BP: (106-153)/(54-112) 113/100 (02/08 0800) SpO2:  [95 %-100 %] 95 % (02/08 0900) Weight:   [78.5 kg-79.8 kg] 79.8 kg (02/07 1647)  GENERAL:NAD  HEAD: Normocephalic, atraumatic.  EYES: Pupils equal, round, reactive to light.  No scleral icterus.  MOUTH: Moist mucosal membrane. NECK: Supple. No thyromegaly. No nodules. No JVD.  PULMONARY: CTAB CARDIOVASCULAR: S1 and S2. Regular rate and rhythm. No murmurs, rubs, or gallops.  GASTROINTESTINAL: Soft, nontender, non-distended. No masses. Positive bowel sounds. No hepatosplenomegaly.  MUSCULOSKELETAL: No swelling, clubbing, or edema.  NEUROLOGIC: Mild distress due to acute illness SKIN:intact,warm,dry   PERTINENT DATA     Infusions: . sodium chloride    . sodium chloride 50 mL/hr at 05/13/19 0800  . clevidipine     Scheduled Medications: .  stroke: mapping our early stages of recovery book   Does not apply Once  . Chlorhexidine Gluconate Cloth  6 each Topical Daily  . famotidine  20 mg Oral BID  . heparin  5,000 Units Subcutaneous Q8H  . labetalol  20 mg Intravenous Once  . pantoprazole (PROTONIX) IV  40 mg Intravenous QHS  . sodium chloride flush  3 mL Intravenous Once  . sodium chloride flush  3 mL Intravenous Q12H   PRN Medications: sodium chloride, acetaminophen **OR** acetaminophen (TYLENOL) oral liquid 160 mg/5 mL **OR** acetaminophen, ondansetron (ZOFRAN) IV, senna-docusate, sodium chloride flush Hemodynamic parameters:   Intake/Output: 02/07 0701 - 02/08 0700 In: 102 [P.O.:360; I.V.:500.8; IV Piggyback:54.2] Out: 1100 [Urine:1100]  Ventilator  Settings:       LAB RESULTS:  Basic Metabolic Panel: Recent Labs  Lab 05/12/19 1406 05/13/19 0449  NA 136 138  K 3.9 3.8  CL 103 108  CO2 24 22  GLUCOSE 102* 98  BUN 27* 24*  CREATININE 1.15* 1.06*  CALCIUM 9.3 8.6*   Liver Function Tests: Recent Labs  Lab 05/12/19 1406  AST 16  ALT 10  ALKPHOS 44  BILITOT 0.5  PROT 7.5  ALBUMIN 4.4   No results for input(s): LIPASE, AMYLASE in the last 168 hours. No results for input(s): AMMONIA in the  last 168 hours. CBC: Recent Labs  Lab 05/12/19 1406 05/13/19 0449  WBC 7.7 7.0  NEUTROABS 4.1  --   HGB 12.9 11.8*  HCT 40.1 35.8*  MCV 92.6 92.5  PLT 257 218   Cardiac Enzymes: No results for input(s): CKTOTAL, CKMB, CKMBINDEX, TROPONINI in the last 168 hours. BNP: Invalid input(s): POCBNP CBG: Recent Labs  Lab 05/12/19 1350 05/12/19 1649  GLUCAP 92 106*     IMAGING RESULTS:  Imaging: CT HEAD WO CONTRAST  Result Date: 05/12/2019 CLINICAL DATA:  Stroke follow-up.  Worsening headache. EXAM: CT HEAD WITHOUT CONTRAST TECHNIQUE: Contiguous axial images were obtained from the base of the skull through the vertex without intravenous contrast. COMPARISON:  Earlier today FINDINGS: Brain: No evidence of acute cortical infarction, hemorrhage, hydrocephalus, extra-axial collection or mass lesion/mass effect. Right lacunar infarct at the thalamus as noted on prior. Vascular: Atherosclerotic calcification.  No hyperdense vessel. Skull: Normal. Negative for fracture or focal lesion. Sinuses/Orbits: Negative Other: These results were communicated to [place] at 3:27 pmon 2/7/2021by text page via the [facility] messaging system. IMPRESSION:  1. Stable head CT. 2. Lacunar infarct at the right thalamus. Electronically Signed   By: Monte Fantasia M.D.   On: 05/12/2019 15:28   MR BRAIN WO CONTRAST  Result Date: 05/12/2019 CLINICAL DATA:  Aphasia stroke presentation. EXAM: MRI HEAD WITHOUT CONTRAST TECHNIQUE: Multiplanar, multiecho pulse sequences of the brain and surrounding structures were obtained without intravenous contrast. COMPARISON:  CT same day FINDINGS: Brain: Diffusion imaging shows a punctate acute infarction in the left cerebellum. There is a confluent 2 cm infarction at the left temporoparietal junction. Few other scattered punctate acute infarctions in the near by the left parietal lobe. Punctate acute infarction higher in the non contiguous left parietal lobe. Punctate acute infarction in the  high right parietal lobe. This pattern of infarctions suggests embolic disease from the heart or ascending aorta. No evidence of mass effect or hemorrhage. Elsewhere, there are mild chronic small-vessel ischemic changes of the pons. Old lacunar infarction right thalamus. Mild chronic small-vessel change affects the cerebral hemispheric white matter. There is a small meningioma along the clinoid process on the left, no larger than 11 mm. No mass-effect upon the brain. No hemorrhage, hydrocephalus or extra-axial collection. Vascular: Major vessels at the base of the brain show flow. Skull and upper cervical spine: Negative Sinuses/Orbits: Mucosal inflammatory change of the left division of the sphenoid sinus. Other sinuses are clear. Orbits negative. 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 Most extensive area of involvement is in the left temporoparietal junction where there is a confluent 2 cm infarction. Other smaller infarctions seen in the left temporoparietal junction region and left parietal lobe. Punctate acute infarction present in the right parietal lobe. No evidence of hemorrhage. Background pattern of mild chronic small-vessel disease elsewhere as described above. 11 mm meningioma of the clinoid process on the left without mass-effect upon the brain. Electronically Signed   By: Nelson Chimes M.D.   On: 05/12/2019 23:56   DG Chest Port 1 View  Result Date: 05/13/2019 CLINICAL DATA:  79 year old female with code stroke presentation and brain MRI revealing scattered small probable embolic cerebral and cerebellar infarcts. EXAM: PORTABLE CHEST 1 VIEW COMPARISON:  Chest radiographs 12/21/2005. FINDINGS: Portable AP semi upright view at 0412 hours. Cardiomegaly is new since 2007. Other mediastinal contours are within normal limits. Stable lung volumes. Chronic right upper lobe calcified granuloma, benign. Elsewhere when allowing for  portable technique the lungs are clear. Visualized tracheal air column is within normal limits. No pneumothorax or pleural effusion. No acute osseous abnormality identified. IMPRESSION: 1. Cardiomegaly suspected and new since 2007. 2. No acute pulmonary abnormality. Electronically Signed   By: Genevie Ann M.D.   On: 05/13/2019 05:37   CT HEAD CODE STROKE WO CONTRAST  Addendum Date: 05/12/2019   ADDENDUM REPORT: 05/12/2019 14:17 ADDENDUM: Study discussed by telephone with Dr. Arta Silence on 05/12/2019 at 1407 hours. Electronically Signed   By: Genevie Ann M.D.   On: 05/12/2019 14:17   Result Date: 05/12/2019 CLINICAL DATA:  Code stroke. 79 year old female with sudden onset slurred speech. EXAM: CT HEAD WITHOUT CONTRAST TECHNIQUE: Contiguous axial images were obtained from the base of the skull through the vertex without intravenous contrast. COMPARISON:  None. FINDINGS: Brain: Normal cerebral volume. No midline shift, mass effect, or evidence of intracranial mass lesion. No ventriculomegaly. No acute intracranial hemorrhage identified. There is a small hypodensity in the right thalamus on series 2, image 15. This is age indeterminate. Minimal for age  scattered white matter hypodensity elsewhere. No cortically based acute infarct identified. Vascular: Calcified atherosclerosis at the skull base. No suspicious intracranial vascular hyperdensity. Skull: Negative. Sinuses/Orbits: Mild mucosal thickening in the sphenoid sinuses, mild bubbly opacity on the left. Other Visualized paranasal sinuses and mastoids are stable and well pneumatized. Other: Visualized orbits and scalp soft tissues are within normal limits. ASPECTS [facility] Stroke Program Early CT Score) Total score (0-10 with 10 being normal): 10 IMPRESSION: 1. 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 Query acute left side weakness. 2. No superimposed acute cortically based infarct or acute intracranial hemorrhage identified. 3.  ASPECTS 10.  Electronically Signed: By: Genevie Ann M.D. On: 05/12/2019 14:05      ASSESSMENT AND PLAN    Acute cerebrovascular accident  -Neurology on case-appreciate input Case discussed with Dr. Doy Mince today -Status post TPA -Every hour neuro checks -CT head with lacunar infarct right thalamus -Additional imaging as per neurology in process -Statin and aspirin when appropriate   GI/Nutrition GI PROPHYLAXIS as indicated DIET-->TF's as tolerated Constipation protocol as indicated  ENDO - ICU hypoglycemic\Hyperglycemia protocol -check FSBS per protocol   ELECTROLYTES -follow labs as needed -replace as needed -pharmacy consultation   DVT/GI PRX ordered -SCDs  TRANSFUSIONS AS NEEDED MONITOR FSBS ASSESS the need for LABS as needed   Critical care provider statement:   Critical care time (minutes): 34  Critical care time was exclusive of: Separately billable procedures and treating other patients  Critical care was necessary to treat or prevent imminent or life-threatening deterioration of the following conditions:  Acute CVA, aphasia, right-sided hemianopsia, multiple comorbid conditions  Critical care was time spent personally by me on the following activities: Development of treatment plan with patient or surrogate, discussions with consultants, evaluation of patient's response to treatment, examination of patient, obtaining history from patient or surrogate, ordering and performing treatments and interventions, ordering and review of laboratory studies and re-evaluation of patient's condition.  I assumed direction of critical care for this patient from another provider in my specialty: no   This document was prepared using Dragon voice recognition software and may include unintentional dictation errors.    Ottie Glazier, M.D.  Division of [place]

## 2019-05-13 NOTE — Progress Notes (Signed)
OT Cancellation Note  Patient Details Name: Jenna [name] MRN: [id number] DOB: October 22, 1940   Cancelled Treatment:    Reason Eval/Treat Not Completed: OT screened, no needs identified, will sign off   OT consult received and chart reviewed. Upon speaking with PT following evaluation, pt seen getting to restroom and performing standing grooming ADLs at supervision to independent functional level. PT indicates that current recommendation upon d/c is for outpt PT f/u, but overall no acute or long-term OT needs identified at this time. In addition, pt is alert and oriented and indicates that she does not believe she needs OT at this time. Will complete order and sign off. Thank you.  Gerrianne Scale, [place], OTR/L ascom [phone] 05/13/19, 3:45 PM

## 2019-05-13 NOTE — Progress Notes (Signed)
PT Cancellation Note  Patient Details Name: Jenna [name] MRN: [id number] DOB: 1940/07/19   Cancelled Treatment:    Reason Eval/Treat Not Completed: Other (comment). Pt consult received and chart reviewed. Upon chart review this AM, it is noted that pt received tPA for acute CVA at 14:45 on 05/12/2019 with end time at 15:47. Per therapy guidelines, will hold pt until 24 hour window has passed from tPA administration and f/u CT is available. Thank you.   Annamarie Yamaguchi 05/13/2019, 8:49 AM Greggory Stallion, PT, DPT [phone]

## 2019-05-14 ENCOUNTER — Telehealth: Payer: Self-pay | Admitting: *Deleted

## 2019-05-14 ENCOUNTER — Inpatient Hospital Stay (INDEPENDENT_AMBULATORY_CARE_PROVIDER_SITE_OTHER): Payer: Medicare HMO

## 2019-05-14 DIAGNOSIS — I10 Essential (primary) hypertension: Secondary | ICD-10-CM

## 2019-05-14 DIAGNOSIS — I639 Cerebral infarction, unspecified: Secondary | ICD-10-CM

## 2019-05-14 DIAGNOSIS — E785 Hyperlipidemia, unspecified: Secondary | ICD-10-CM

## 2019-05-14 DIAGNOSIS — N1831 Chronic kidney disease, stage 3a: Secondary | ICD-10-CM

## 2019-05-14 DIAGNOSIS — I6310 Cerebral infarction due to embolism of unspecified precerebral artery: Secondary | ICD-10-CM

## 2019-05-14 DIAGNOSIS — E039 Hypothyroidism, unspecified: Secondary | ICD-10-CM

## 2019-05-14 LAB — BASIC METABOLIC PANEL
Anion gap: 9 (ref 5–15)
BUN: 19 mg/dL (ref 8–23)
CO2: 24 mmol/L (ref 22–32)
Calcium: 9.1 mg/dL (ref 8.9–10.3)
Chloride: 107 mmol/L (ref 98–111)
Creatinine, Ser: 1.1 mg/dL — ABNORMAL HIGH (ref 0.44–1.00)
GFR calc Af Amer: 56 mL/min — ABNORMAL LOW (ref >60)
GFR calc non Af Amer: 49 mL/min — ABNORMAL LOW (ref >60)
Glucose, Bld: 113 mg/dL — ABNORMAL HIGH (ref 70–99)
Potassium: 3.9 mmol/L (ref 3.5–5.1)
Sodium: 140 mmol/L (ref 135–145)

## 2019-05-14 MED ORDER — ASPIRIN EC 325 MG PO TBEC
325.0000 mg | DELAYED_RELEASE_TABLET | Freq: Every day | ORAL | Status: DC
  Administered 2019-05-14: 09:00:00 325 mg via ORAL
  Filled 2019-05-14: qty 1

## 2019-05-14 MED ORDER — ASPIRIN 325 MG PO TBEC: 325.0000 mg | DELAYED_RELEASE_TABLET | Freq: Every day | ORAL | 0 refills | Status: DC

## 2019-05-14 MED ORDER — ROSUVASTATIN CALCIUM 40 MG PO TABS: 40.0000 mg | ORAL_TABLET | Freq: Every day | ORAL | 0 refills | Status: DC

## 2019-05-14 NOTE — TOC Initial Note (Signed)
Transition of Care [facility]) - Initial/Assessment Note    Patient Details  Name: Jenna [name] MRN: [id number] Date of Birth: 1941/01/23  Transition of Care [facility]) CM/SW Contact:    Su Hilt, RN Phone Number: 05/14/2019, 11:05 AM  Clinical Narrative:                 Met with the patient and her husband at bedside She lives at home in a single story house with her husband and has a RW and a single point cane She is agreeable to go to Outpatient PT, I faxed the referral to [facility] outpatient PT to [phone]. I provided her with a copy of the referral if she needs it. She does not need any DME  And states she has all that she needs, She is independent at baseline and is doing very well.      Expected Discharge Plan: OP Rehab Barriers to Discharge: Barriers Resolved   Patient Goals and CMS Choice Patient states their goals for this hospitalization and ongoing recovery are:: go home      Expected Discharge Plan and Services Expected Discharge Plan: OP Rehab   Discharge Planning Services: CM Consult   Living arrangements for the past 2 months: Single Family Home Expected Discharge Date: 05/14/19               DME Arranged: N/A         HH Arranged: NA          Prior Living Arrangements/Services Living arrangements for the past 2 months: Single Family Home Lives with:: Spouse Patient language and need for interpreter reviewed:: Yes Do you feel safe going back to the place where you live?: Yes      Need for Family Participation in Patient Care: No (Comment) Care giver support system in place?: Yes (comment) Current home services: DME(RW and single point cane) Criminal Activity/Legal Involvement Pertinent to Current Situation/Hospitalization: No - Comment as needed  Activities of Daily Living Home Assistive Devices/Equipment: Other (Comment)(cane) ADL Screening (condition at time of admission) Patient's cognitive ability adequate to safely complete daily  activities?: Yes Is the patient deaf or have difficulty hearing?: No Does the patient have difficulty seeing, even when wearing glasses/contacts?: No Does the patient have difficulty concentrating, remembering, or making decisions?: No Patient able to express need for assistance with ADLs?: Yes Does the patient have difficulty dressing or bathing?: No Independently performs ADLs?: Yes (appropriate for developmental age) Does the patient have difficulty walking or climbing stairs?: No Weakness of Legs: None Weakness of Arms/Hands: None  Permission Sought/Granted   Permission granted to share information with : Yes, Verbal Permission Granted              Emotional Assessment Appearance:: Appears stated age Attitude/Demeanor/Rapport: Engaged Affect (typically observed): Appropriate Orientation: : Oriented to Self, Oriented to Situation, Oriented to Place, Oriented to  Time Alcohol / Substance Use: Not Applicable Psych Involvement: No (comment)  Admission diagnosis:  CVA (cerebral vascular accident) ([place]) [I63.9] Stroke (cerebrum) ([place]) [I63.9] Acute CVA (cerebrovascular accident) [facility]) [I63.9] Patient Active Problem List   Diagnosis Date Noted  . CVA (cerebral vascular accident) ([place]) 05/12/2019  . Stroke (cerebrum) ([place]) 05/12/2019   PCP:  Perrin Maltese, MD Pharmacy:   [id number]: [phone]: [address]  [address] [place] [address] Phone: [phone] Fax: [place] [address] [place], [place] [address] Phone: [phone] Fax: [phone]     Social Determinants of Health (SDOH) Interventions    Readmission Risk Interventions No flowsheet data found.

## 2019-05-14 NOTE — Consult Note (Signed)
Cardiology Consultation:   Patient ID: Jenna [name] MRN: [id number]; DOB: November 21, 1940  Admit date: 05/12/2019 Date of Consult: 05/14/2019  Primary Care Provider: Perrin Maltese, MD Primary Cardiologist: New - Kalissa Grays Primary Electrophysiologist:  None    Patient Profile:   Jenna [name] is a 79 y.o. female with a hx of hypertension, hyperlipidemia, thyroid disease, and gout who is being seen today for the evaluation of suspected cardioembolic stroke at the request of Dr. Leslye Peer.  History of Present Illness:   Jenna [name] was in her usual state of health until the afternoon of 05/12/2019, when she began having difficulty speaking.  Family members noticed that she was slurring her speech and not making sense.  Her husband transported her immediately to the emergency room where she was diagnosed with an acute stroke and given TPA.  MRI of the brain showed multiple acute infarctions consistent with embolic disease, predominantly involving the left temporoparietal junction, though other areas of infarction were noted in both cerebral hemispheres.  Jenna [name] feels like her speech has improved significantly and is almost back to normal.  She denies having any other deficits.  Jenna [name] denies a history of prior stroke as well as cardiac disease.  She has not had any chest pain, shortness of breath, or lightheadedness.  She has chronic lower extremity edema, which has been stable.  On further questioning, she reports intermittent palpitations lasting a few minutes at a time that has been present for years.  Episodes only happen every few weeks to months and have never been evaluated in the past.  She denies a history of atrial fibrillation or prior thrombotic event.  Heart Pathway Score:     Past Medical History:  Diagnosis Date  . Gout   . Hyperlipidemia   . Hypertension   . Thyroid disease     Past Surgical History:  Procedure Laterality Date  . BREAST BIOPSY Right 1978   neg  .  CHOLECYSTECTOMY       Home Medications:  Prior to Admission medications   Medication Sig Start Date Bennet Kujawa Date Taking? Authorizing Provider  allopurinol (ZYLOPRIM) 100 MG tablet Take 100 mg by mouth 2 (two) times daily. 04/03/19  Yes [provider]  aspirin 81 MG chewable tablet Chew 243 mg by mouth once.   Yes [provider]  Calcium Carbonate-Vitamin D 600-200 MG-UNIT TABS Take 1 tablet by mouth 2 (two) times daily with a meal.   Yes [provider]  carvedilol (COREG) 12.5 MG tablet Take 12.5 mg by mouth 2 (two) times daily with a meal.   Yes [provider]  furosemide (LASIX) 20 MG tablet Take 20 mg by mouth daily as needed for fluid.  05/08/19  Yes [provider]  levothyroxine (SYNTHROID) 50 MCG tablet Take 50 mcg by mouth daily. 04/30/19  Yes [provider]  aspirin EC 325 MG EC tablet Take 1 tablet (325 mg total) by mouth daily. 05/15/19   Loletha Grayer, MD  rosuvastatin (CRESTOR) 40 MG tablet Take 1 tablet (40 mg total) by mouth daily at 6 PM. 05/14/19   Loletha Grayer, MD    Inpatient Medications: Scheduled Meds: .  stroke: mapping our early stages of recovery book   Does not apply Once  . aspirin EC  325 mg Oral Daily  . Chlorhexidine Gluconate Cloth  6 each Topical Daily  . famotidine  20 mg Oral BID  . heparin  5,000 Units Subcutaneous Q8H  . levothyroxine  50  mcg Oral Q0600  . rosuvastatin  40 mg Oral q1800  . sodium chloride flush  3 mL Intravenous Once  . sodium chloride flush  3 mL Intravenous Q12H   Continuous Infusions: . sodium chloride     PRN Meds: sodium chloride, acetaminophen **OR** acetaminophen (TYLENOL) oral liquid 160 mg/5 mL **OR** acetaminophen, ondansetron (ZOFRAN) IV, senna-docusate, sodium chloride flush  Allergies:   No Known Allergies  Social History:   Social History   Socioeconomic History  . Marital status: Married    Spouse name: Not on file  . Number of children: Not on file  .  Years of education: Not on file  . Highest education level: Not on file  Occupational History  . Not on file  Tobacco Use  . Smoking status: Current Every Day Smoker    Packs/day: 0.50    Types: Cigarettes  . Smokeless tobacco: Never Used  Substance and Sexual Activity  . Alcohol use: No  . Drug use: No  . Sexual activity: Not on file  Other Topics Concern  . Not on file  Social History Narrative  . Not on file   Social Determinants of Health   Financial Resource Strain:   . Difficulty of Paying Living Expenses: Not on file  Food Insecurity:   . Worried About Charity fundraiser in the Last Year: Not on file  . Ran Out of Food in the Last Year: Not on file  Transportation Needs:   . Lack of Transportation (Medical): Not on file  . Lack of Transportation (Non-Medical): Not on file  Physical Activity:   . Days of Exercise per Week: Not on file  . Minutes of Exercise per Session: Not on file  Stress:   . Feeling of Stress : Not on file  Social Connections:   . Frequency of Communication with Friends and Family: Not on file  . Frequency of Social Gatherings with Friends and Family: Not on file  . Attends Religious Services: Not on file  . Active Member of Clubs or Organizations: Not on file  . Attends Archivist Meetings: Not on file  . Marital Status: Not on file  Intimate Partner Violence:   . Fear of Current or Ex-Partner: Not on file  . Emotionally Abused: Not on file  . Physically Abused: Not on file  . Sexually Abused: Not on file    Family History:   Family History  Problem Relation Age of Onset  . Breast cancer Maternal Grandmother 98  . Breast cancer Cousin      ROS:  Please see the history of present illness. All other ROS reviewed and negative.     Physical Exam/Data:   Vitals:   05/14/19 0021 05/14/19 0500 05/14/19 0751 05/14/19 1504  BP: 126/70  140/68 (!) 149/84  Pulse: 72  76 67  Resp: 12     Temp: 97.9 F (36.6 C)  97.8 F (36.6  C) 98.3 F (36.8 C)  TempSrc: Oral  Oral Oral  SpO2: 98%  100% 100%  Weight:  84.4 kg    Height:        Intake/Output Summary (Last 24 hours) at 05/14/2019 1706 Last data filed at 05/14/2019 1300 Gross per 24 hour  Intake 240 ml  Output --  Net 240 ml   Last 3 Weights 05/14/2019 05/12/2019 05/12/2019  Weight (lbs) 186 lb 1.1 oz 175 lb 14.8 oz 173 lb  Weight (kg) 84.4 kg 79.8 kg 78.472 kg  Body mass index is 30.96 kg/m.  General:  Well nourished, well developed, in no acute distress.  She is accompanied by her husband. HEENT: normal Lymph: no adenopathy Neck: no JVD Endocrine:  No thryomegaly Vascular: No carotid bruits; 2+ radial and pedal pulses bilaterally. Cardiac: Regular rate and rhythm without murmurs, rubs, or gallops. Lungs:  clear to auscultation bilaterally, no wheezing, rhonchi or rales  Abd: soft, nontender, no hepatomegaly  Ext: no edema Musculoskeletal:  No deformities, BUE and BLE strength normal and equal Skin: warm and dry  Neuro: Speech is minimally slurred.  Cranial nerves otherwise intact.  Normal upper and lower extremity strength. Psych:  Normal affect   EKG:  The EKG performed 05/07/2019 and 2:04 PM was personally reviewed and demonstrates: Normal sinus rhythm with low voltage.  Otherwise, no significant abnormality. Telemetry:  Telemetry was personally reviewed and demonstrates: Sinus rhythm with PVCs.  No atrial fibrillation or flutter.  Relevant CV Studies: TTE (05/13/2019): 1. Left ventricular ejection fraction, by estimation, is 55 to 60%. The  left ventricle has normal function. Left ventricular diastolic parameters  are consistent with Grade I diastolic dysfunction (impaired relaxation).  2. Right ventricular systolic function is normal. Tricuspid regurgitation  signal is inadequate for assessing RVSP.  3. Agitated saline contrast bubble study was negative, with no evidence  of any interatrial shunt.   Laboratory Data:  High Sensitivity  Troponin:  No results for input(s): TROPONINIHS in the last 720 hours.   Chemistry Recent Labs  Lab 05/12/19 1406 05/13/19 0449 05/14/19 0439  NA 136 138 140  K 3.9 3.8 3.9  CL 103 108 107  CO2 24 22 24   GLUCOSE 102* 98 113*  BUN 27* 24* 19  CREATININE 1.15* 1.06* 1.10*  CALCIUM 9.3 8.6* 9.1  GFRNONAA 46* 51* 49*  GFRAA 54* 59* 56*  ANIONGAP 9 8 9     Recent Labs  Lab 05/12/19 1406  PROT 7.5  ALBUMIN 4.4  AST 16  ALT 10  ALKPHOS 44  BILITOT 0.5   Hematology Recent Labs  Lab 05/12/19 1406 05/13/19 0449  WBC 7.7 7.0  RBC 4.33 3.87  HGB 12.9 11.8*  HCT 40.1 35.8*  MCV 92.6 92.5  MCH 29.8 30.5  MCHC 32.2 33.0  RDW 13.9 13.9  PLT 257 218   BNP Recent Labs  Lab 05/12/19 1406  BNP 50.0    DDimer No results for input(s): DDIMER in the last 168 hours.   Radiology/Studies:  CT HEAD WO CONTRAST  Result Date: 05/13/2019 CLINICAL DATA:  Stroke follow-up status post tPA. EXAM: CT HEAD WITHOUT CONTRAST TECHNIQUE: Contiguous axial images were obtained from the base of the skull through the vertex without intravenous contrast. COMPARISON:  Head CT and MRI 05/12/2019 FINDINGS: Brain: A 2 cm region of cortically based hypoattenuation in the left temporo-occipital region corresponds to an acute infarct on MRI. Other smaller acute infarcts on MRI are not well demonstrated by CT. There is a background of mild chronic small vessel ischemia in the cerebral white matter with a chronic lacunar infarct again noted in the right thalamus. No intracranial hemorrhage, midline shift, or extra-axial fluid collection is identified. The ventricles and sulci are within normal limits for age. A small meningioma along the left anterior clinoid process is better demonstrated on MRI. Vascular: Calcified atherosclerosis at the skull base. No hyperdense vessel. Skull: No fracture or suspicious osseous lesion. Sinuses/Orbits: Mild left sphenoid sinus mucosal thickening. No significant mastoid fluid.  Unremarkable orbits. Other: None. IMPRESSION: 1. Acute  left temporo-occipital infarct with other smaller infarcts on MRI not well demonstrated by CT. 2. No intracranial hemorrhage. Electronically Signed   By: Logan Bores M.D.   On: 05/13/2019 10:16   CT HEAD WO CONTRAST  Result Date: 05/12/2019 CLINICAL DATA:  Stroke follow-up.  Worsening headache. EXAM: CT HEAD WITHOUT CONTRAST TECHNIQUE: Contiguous axial images were obtained from the base of the skull through the vertex without intravenous contrast. COMPARISON:  Earlier today FINDINGS: Brain: No evidence of acute cortical infarction, hemorrhage, hydrocephalus, extra-axial collection or mass lesion/mass effect. Right lacunar infarct at the thalamus as noted on prior. Vascular: Atherosclerotic calcification.  No hyperdense vessel. Skull: Normal. Negative for fracture or focal lesion. Sinuses/Orbits: Negative Other: These results were communicated to [place] at 3:27 pmon 2/7/2021by text page via the [facility] messaging system. IMPRESSION: 1. Stable head CT. 2. Lacunar infarct at the right thalamus. Electronically Signed   By: Monte Fantasia M.D.   On: 05/12/2019 15:28   MR BRAIN WO CONTRAST  Result Date: 05/12/2019 CLINICAL DATA:  Aphasia stroke presentation. EXAM: MRI HEAD WITHOUT CONTRAST TECHNIQUE: Multiplanar, multiecho pulse sequences of the brain and surrounding structures were obtained without intravenous contrast. COMPARISON:  CT same day FINDINGS: Brain: Diffusion imaging shows a punctate acute infarction in the left cerebellum. There is a confluent 2 cm infarction at the left temporoparietal junction. Few other scattered punctate acute infarctions in the near by the left parietal lobe. Punctate acute infarction higher in the non contiguous left parietal lobe. Punctate acute infarction in the high right parietal lobe. This pattern of infarctions suggests embolic disease from the heart or ascending aorta. No evidence of mass effect or hemorrhage.  Elsewhere, there are mild chronic small-vessel ischemic changes of the pons. Old lacunar infarction right thalamus. Mild chronic small-vessel change affects the cerebral hemispheric white matter. There is a small meningioma along the clinoid process on the left, no larger than 11 mm. No mass-effect upon the brain. No hemorrhage, hydrocephalus or extra-axial collection. Vascular: Major vessels at the base of the brain show flow. Skull and upper cervical spine: Negative Sinuses/Orbits: Mucosal inflammatory change of the left division of the sphenoid sinus. Other sinuses are clear. Orbits negative. 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 Most extensive area of involvement is in the left temporoparietal junction where there is a confluent 2 cm infarction. Other smaller infarctions seen in the left temporoparietal junction region and left parietal lobe. Punctate acute infarction present in the right parietal lobe. No evidence of hemorrhage. Background pattern of mild chronic small-vessel disease elsewhere as described above. 11 mm meningioma of the clinoid process on the left without mass-effect upon the brain. Electronically Signed   By: Nelson Chimes M.D.   On: 05/12/2019 23:56   US Carotid Bilateral (at [facility] and [facility] only)  Result Date: 05/13/2019 CLINICAL DATA:  79 year old female with recent stroke (multifocal acute infarctions consistent with embolic disease). EXAM: BILATERAL CAROTID DUPLEX ULTRASOUND TECHNIQUE: Pearline Cables scale imaging, color Doppler and duplex ultrasound were performed of bilateral carotid and vertebral arteries in the neck. COMPARISON:  MRI brain 05/12/2019 FINDINGS: Criteria: Quantification of carotid stenosis is based on velocity parameters that correlate the residual internal carotid diameter with NASCET-based stenosis levels, using the diameter of the distal internal carotid lumen as the denominator for stenosis  measurement. The following velocity measurements were obtained: RIGHT ICA: 50/18 cm/sec CCA: 67/89 cm/sec SYSTOLIC ICA/CCA RATIO:  0.7 ECA:  53 cm/sec LEFT ICA: 65/21 cm/sec  CCA: 73/22 cm/sec SYSTOLIC ICA/CCA RATIO:  1.0 ECA:  96 cm/sec RIGHT CAROTID ARTERY: No significant atherosclerotic plaque or evidence of stenosis. RIGHT VERTEBRAL ARTERY:  Patent with antegrade flow. LEFT CAROTID ARTERY: Mild smooth heterogeneous atherosclerotic plaque in the proximal internal carotid artery. By peak systolic velocity criteria, the estimated stenosis is less than 50%. LEFT VERTEBRAL ARTERY:  Patent with normal antegrade flow. IMPRESSION: 1. Mild (1-49%) stenosis proximal left internal carotid artery secondary to mild smooth heterogeneous atherosclerotic plaque. 2. No significant atherosclerotic plaque or evidence of stenosis in the right internal carotid artery. 3. Vertebral arteries are patent with normal antegrade flow. Signed, Criselda Peaches, MD, [facility] Radiology Electronically Signed   By: Jacqulynn Cadet M.D.   On: 05/13/2019 10:49   DG Chest Port 1 View  Result Date: 05/13/2019 CLINICAL DATA:  79 year old female with code stroke presentation and brain MRI revealing scattered small probable embolic cerebral and cerebellar infarcts. EXAM: PORTABLE CHEST 1 VIEW COMPARISON:  Chest radiographs 12/21/2005. FINDINGS: Portable AP semi upright view at 0412 hours. Cardiomegaly is new since 2007. Other mediastinal contours are within normal limits. Stable lung volumes. Chronic right upper lobe calcified granuloma, benign. Elsewhere when allowing for portable technique the lungs are clear. Visualized tracheal air column is within normal limits. No pneumothorax or pleural effusion. No acute osseous abnormality identified. IMPRESSION: 1. Cardiomegaly suspected and new since 2007. 2. No acute pulmonary abnormality. Electronically Signed   By: Genevie Ann M.D.   On:  05/13/2019 05:37   ECHOCARDIOGRAM COMPLETE  Result Date: 05/13/2019    ECHOCARDIOGRAM REPORT   Patient Name:   ROSELAND BRAUN Date of Exam: 05/13/2019 Medical Rec #:  [id number]        Height:       65.0 in Accession #:    [id number]       Weight:       175.9 lb Date of Birth:  24-May-1940        BSA:          1.87 m Patient Age:    59 years         BP:           110/77 mmHg Patient Gender: F                HR:           72 bpm. Exam Location:  [facility] Procedure: 2D Echo, Color Doppler, Cardiac Doppler and Saline Contrast Bubble            Study Indications:     I163.9 Stroke  History:         Patient has no prior history of Echocardiogram examinations.                  Risk Factors:Hypertension and Dyslipidemia.  Sonographer:     Charmayne Sheer RDCS (AE) Referring Phys:  [place] Diagnosing Phys: Kathlyn Sacramento MD IMPRESSIONS  1. Left ventricular ejection fraction, by estimation, is 55 to 60%. The left ventricle has normal function. Left ventricular diastolic parameters are consistent with Grade I diastolic dysfunction (impaired relaxation).  2. Right ventricular systolic function is normal. Tricuspid regurgitation signal is inadequate for assessing RVSP.  3. Agitated saline contrast bubble study was negative, with no evidence of any interatrial shunt. Conclusion(s)/Recomendation(s): No intracardiac source of embolism detected on this transthoracic study. FINDINGS  Left Ventricle: Left ventricular ejection fraction, by estimation, is 55 to 60%. The left ventricle  has normal function. The left ventricle has no regional wall motion abnormalities. The left ventricular internal cavity size was normal in size. There is  no left ventricular hypertrophy. Right Ventricle: The right ventricular size is normal. No increase in right ventricular wall thickness. Right ventricular systolic function is normal. Tricuspid regurgitation signal is inadequate for assessing RVSP. Left Atrium: Left atrial size was normal in size.  Right Atrium: Right atrial size was normal in size. Pericardium: There is no evidence of pericardial effusion. Mitral Valve: The mitral valve is normal in structure and function. Normal mobility of the mitral valve leaflets. No evidence of mitral valve regurgitation. No evidence of mitral valve stenosis. MV peak gradient, 4.4 mmHg. The mean mitral valve gradient is 3.0 mmHg. Tricuspid Valve: The tricuspid valve is normal in structure. Tricuspid valve regurgitation is not demonstrated. No evidence of tricuspid stenosis. Aortic Valve: The aortic valve is normal in structure and function. Aortic valve regurgitation is not visualized. No aortic stenosis is present. Aortic valve mean gradient measures 4.0 mmHg. Aortic valve peak gradient measures 9.6 mmHg. Aortic valve area, by VTI measures 2.84 cm. Pulmonic Valve: The pulmonic valve was normal in structure. Pulmonic valve regurgitation is not visualized. No evidence of pulmonic stenosis. Aorta: The aortic root is normal in size and structure. Pulmonary Artery: The pulmonary artery is of normal size. Venous: The left upper pulmonary vein, left lower pulmonary vein, right upper pulmonary vein and right lower pulmonary vein are normal. The inferior vena cava is normal in size with greater than 50% respiratory variability, suggesting right atrial pressure of 3 mmHg. IAS/Shunts: No atrial level shunt detected by color flow Doppler. Agitated saline contrast was given intravenously to evaluate for intracardiac shunting. Agitated saline contrast bubble study was negative, with no evidence of any interatrial shunt.  LEFT VENTRICLE PLAX 2D LVIDd:         4.90 cm  Diastology LVIDs:         3.11 cm  LV e' lateral:   7.62 cm/s LV PW:         0.82 cm  LV E/e' lateral: 12.1 LV IVS:        0.49 cm  LV e' medial:    7.72 cm/s LVOT diam:     2.10 cm  LV E/e' medial:  11.9 LV SV:         76.55 ml LV SV Index:   38.53 LVOT Area:     3.46 cm  RIGHT VENTRICLE RV Basal diam:  3.20 cm LEFT  ATRIUM           Index       RIGHT ATRIUM           Index LA diam:      2.20 cm 1.17 cm/m  RA Area:     11.40 cm LA Vol (A2C): 42.4 ml 22.64 ml/m RA Volume:   19.90 ml  10.62 ml/m LA Vol (A4C): 18.6 ml 9.93 ml/m  AORTIC VALVE                   PULMONIC VALVE AV Area (Vmax):    2.35 cm    PV Vmax:       0.90 m/s AV Area (Vmean):   2.44 cm    PV Vmean:      63.400 cm/s AV Area (VTI):     2.84 cm    PV VTI:        0.182 m AV Vmax:  155.00 cm/s PV Peak grad:  3.3 mmHg AV Vmean:          96.000 cm/s PV Mean grad:  2.0 mmHg AV VTI:            0.270 m AV Peak Grad:      9.6 mmHg AV Mean Grad:      4.0 mmHg LVOT Vmax:         105.00 cm/s LVOT Vmean:        67.700 cm/s LVOT VTI:          0.221 m LVOT/AV VTI ratio: 0.82  AORTA Ao Root diam: 3.00 cm MITRAL VALVE MV Area (PHT): 3.48 cm              SHUNTS MV Peak grad:  4.4 mmHg              Systemic VTI:  0.22 m MV Mean grad:  3.0 mmHg              Systemic Diam: 2.10 cm MV Vmax:       1.05 m/s MV Vmean:      80.4 cm/s MV Decel Time: 218 msec MV E velocity: 92.20 cm/s  103 cm/s MV A velocity: 112.00 cm/s 70.3 cm/s MV E/A ratio:  0.82        1.5 Kathlyn Sacramento MD Electronically signed by Kathlyn Sacramento MD Signature Date/Time: 05/13/2019/11:26:59 AM    Final    CT HEAD CODE STROKE WO CONTRAST  Addendum Date: 05/12/2019   ADDENDUM REPORT: 05/12/2019 14:17 ADDENDUM: Study discussed by telephone with Dr. Arta Silence on 05/12/2019 at 1407 hours. Electronically Signed   By: Genevie Ann M.D.   On: 05/12/2019 14:17   Result Date: 05/12/2019 CLINICAL DATA:  Code stroke. 79 year old female with sudden onset slurred speech. EXAM: CT HEAD WITHOUT CONTRAST TECHNIQUE: Contiguous axial images were obtained from the base of the skull through the vertex without intravenous contrast. COMPARISON:  None. FINDINGS: Brain: Normal cerebral volume. No midline shift, mass effect, or evidence of intracranial mass lesion. No ventriculomegaly. No acute intracranial hemorrhage  identified. There is a small hypodensity in the right thalamus on series 2, image 15. This is age indeterminate. Minimal for age scattered white matter hypodensity elsewhere. No cortically based acute infarct identified. Vascular: Calcified atherosclerosis at the skull base. No suspicious intracranial vascular hyperdensity. Skull: Negative. Sinuses/Orbits: Mild mucosal thickening in the sphenoid sinuses, mild bubbly opacity on the left. Other Visualized paranasal sinuses and mastoids are stable and well pneumatized. Other: Visualized orbits and scalp soft tissues are within normal limits. ASPECTS [facility] Stroke Program Early CT Score) Total score (0-10 with 10 being normal): 10 IMPRESSION: 1. 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 Query acute left side weakness. 2. No superimposed acute cortically based infarct or acute intracranial hemorrhage identified. 3.  ASPECTS 10. Electronically Signed: By: Genevie Ann M.D. On: 05/12/2019 14:05    Assessment and Plan:   Cardioembolic stroke: Distribution of acute stroke is certainly concerning for an embolic event.  Fortunately, Ms. Eickholt has almost completely returned to baseline following administration of TPA.  Work-up thus far including transthoracic echo, carotid Dopplers, and telemetry monitoring have been unrevealing.  Given history of palpitations and the patient's age, paroxysmal atrial fibrillation would be the most likely explanation for her stroke.  We will therefore begin with a 14-day event monitor.  If this is unrevealing, we will need to consider proceeding with TEE +/-implantable loop recorder.  In the meantime, I  agree with aspirin and high intensity statin therapy for secondary prevention.  Hypertension: Pressure mildly elevated but reasonable in the setting of acute stroke.  Continue current regimen of carvedilol.  [facility] will sign off.  Findings and recommendations were discussed with Dr. Leslye Peer. Medication  Recommendations:  Continue aspirin and statin therapy per neurology Other recommendations (labs, testing, etc):  14-day event monitor has been placed Follow up as an outpatient:  Return to clinic in 3-4 weeks to review results of event monitor and discuss the need for additional testing.  For questions or updates, please contact [place] Please consult [email or web address] for contact info under [facility].  Signed, Nelva Bush, MD  05/14/2019 5:06 PM

## 2019-05-14 NOTE — Progress Notes (Signed)
D: Pt alert and oriented x 4. Pt denies experiencing any pain at this time. Pt received stroke education/handout.  A: Pt and significant other received discharge and medication education/information. Pt belongings were gathered and taken with pt upon discharge.   R: Pt and significant other verbalized understanding of discharge and medication education/information.  Pt escorted to [facility] front lobby by staff via wheelchair where family picked pt up.

## 2019-05-14 NOTE — Progress Notes (Signed)
Physical Therapy Treatment Patient Details Name: Jenna [name] MRN: [id number] DOB: 05-11-1940 Today's Date: 05/14/2019    History of Present Illness Pt admitted for L temparopateial lobe CVA and R parietal lobe CVA and is now s/p TPA intervention. Repeat CT scan negative and cleared to work with pt via Conservation officer, historic buildings.     PT Comments    Pt is making good progress towards goals with ability to tolerate increases in challenge regarding balance/strength. Pt still remains impulsive frequently setting off bed alarm due to getting up without staff present. Discussed safety concerns. Able to perform hallway ambulation and stair training safely. Will continue to progress as able.   Follow Up Recommendations  Outpatient PT     Equipment Recommendations  None recommended by PT    Recommendations for Other Services       Precautions / Restrictions Precautions Precautions: Fall Restrictions Weight Bearing Restrictions: No    Mobility  Bed Mobility Overal bed mobility: Independent             General bed mobility comments: impulsive and needs reminders for safety. No assist provided  Transfers Overall transfer level: Independent Equipment used: None             General transfer comment: needs cues for safety as pt got up OOB twice during this session despite cues  Ambulation/Gait Ambulation/Gait assistance: Supervision Gait Distance (Feet): 400 Feet Assistive device: None Gait Pattern/deviations: Step-through pattern     General Gait Details: ambulated in hallway with reciprocal gait pattern and upright posture. Able to hold conversation during ambulation without LOB. Good gait speed.   Stairs Stairs: Yes Stairs assistance: Min guard Stair Management: One rail Right Number of Stairs: 4 General stair comments: 2 x 4 stairs with safe technique including reciprocal gait pattern and step to gait pattern. Cues given for safety and technique.   Wheelchair Mobility     Modified Rankin (Stroke Patients Only)       Balance Overall balance assessment: Independent                                          Cognition Arousal/Alertness: Awake/alert Behavior During Therapy: Impulsive Overall Cognitive Status: Within Functional Limits for tasks assessed                                        Exercises Other Exercises Other Exercises: Safety scavenger hunt in room finding household objects, naming, and providing use. Also able to identify emergency number to call in home environment. Other Exercises: standing ther-ex performed using counter for stable surface. 10 reps of squats, alt. marching, and hip abd. Supervision given. Also standing balance performed including tandem stance and feet together with eyes closed. 30 sec hold for each activity    General Comments        Pertinent Vitals/Pain Pain Assessment: No/denies pain    Home Living                      Prior Function            PT Goals (current goals can now be found in the care plan section) Acute Rehab PT Goals Patient Stated Goal: to go home PT Goal Formulation: With patient Time For Goal Achievement: 05/27/19 Potential to Achieve  Goals: Good Progress towards PT goals: Progressing toward goals    Frequency    7X/week      PT Plan Current plan remains appropriate    Co-evaluation              AM-PAC PT "6 Clicks" Mobility   Outcome Measure  Help needed turning from your back to your side while in a flat bed without using bedrails?: None Help needed moving from lying on your back to sitting on the side of a flat bed without using bedrails?: None Help needed moving to and from a bed to a chair (including a wheelchair)?: None Help needed standing up from a chair using your arms (e.g., wheelchair or bedside chair)?: None Help needed to walk in hospital room?: None Help needed climbing 3-5 steps with a railing? : None 6  Click Score: 24    End of Session Equipment Utilized During Treatment: Gait belt Activity Tolerance: Patient tolerated treatment well Patient left: in chair;with chair alarm set Nurse Communication: Mobility status PT Visit Diagnosis: Unsteadiness on feet (R26.81)     Time: 2500-3704 PT Time Calculation (min) (ACUTE ONLY): 26 min  Charges:  $Gait Training: 8-22 mins $Therapeutic Exercise: 8-22 mins                     Greggory Stallion, PT, DPT [phone]    Jenna [name] 05/14/2019, 10:10 AM

## 2019-05-14 NOTE — Discharge Summary (Signed)
[facility] [place] NAME: Jenna [name]    MR#:  [id number]  DATE OF BIRTH:  Feb 08, 1941  DATE OF ADMISSION:  05/12/2019 ADMITTING PHYSICIAN: Alexis Goodell, MD  DATE OF DISCHARGE: 05/14/2019  PRIMARY CARE PHYSICIAN: Perrin Maltese, MD    ADMISSION DIAGNOSIS:  CVA (cerebral vascular accident) ([place]) [I63.9] Stroke (cerebrum) ([place]) [I63.9] Acute CVA (cerebrovascular accident) ([place]) [I63.9]  DISCHARGE DIAGNOSIS:  Active Problems:   CVA (cerebral vascular accident) ([place])   Stroke (cerebrum) ([place])   SECONDARY DIAGNOSIS:   Past Medical History:  Diagnosis Date  . Gout   . Hyperlipidemia   . Hypertension   . Thyroid disease     HOSPITAL COURSE:   1.  Acute cardioembolic strokes with symptoms of left-sided headache, speech and word finding abnormalities and right-sided visual issues.  Patient was given TPA by neurology.  Patient was admitted as a code stroke by the critical care team.  The medical team picked up on the day of discharge.  MRI showed multiple acute infarctions consistent with embolic disease.  The most extensive areas the left temporoparietal junction.  Patient also had a punctate acute infarction in the right parietal lobe.  No hemorrhage.  Of note also an 11 mm meningioma was seen.  No arrhythmias on telemetry, carotid ultrasound negative, echocardiogram bubble study negative.  Case discussed with neurology and cardiology.  Patient will be set up for a Holter monitor and monitored for arrhythmias.  If this is negative then TEE will be set up.  Patient will be discharged home on aspirin and Crestor.  Patient still has some word finding abnormalities but her vision is better.  Patient was set up with outpatient physical therapy because she did well with the physical therapist. 2.  Essential hypertension.  we will allow permissive hypertension at this time.  Blood pressure 140/68 upon discharge. 3.  Hyperlipidemia unspecified.  LDL  103.  Goal less than 70.  Crestor will handle. 4.  Hypothyroidism unspecified on levothyroxine. 5.  Chronic kidney disease stage IIIa.  Creatinine 1.1 and GFR 49 upon discharge 6.  History of gout on allopurinol  DISCHARGE CONDITIONS:   Satisfactory  CONSULTS OBTAINED:  Treatment Team:  Catarina Hartshorn, MD End, Harrell Gave, MD  DRUG ALLERGIES:  No Known Allergies  DISCHARGE MEDICATIONS:   Allergies as of 05/14/2019   No Known Allergies     Medication List    STOP taking these medications   aspirin 81 MG chewable tablet Replaced by: aspirin 325 MG EC tablet   carvedilol 12.5 MG tablet Commonly known as: COREG   furosemide 20 MG tablet Commonly known as: LASIX     TAKE these medications   allopurinol 100 MG tablet Commonly known as: ZYLOPRIM Take 100 mg by mouth 2 (two) times daily.   aspirin 325 MG EC tablet Take 1 tablet (325 mg total) by mouth daily. Start taking on: May 15, 2019 Replaces: aspirin 81 MG chewable tablet   Calcium Carbonate-Vitamin D 600-200 MG-UNIT Tabs Take 1 tablet by mouth 2 (two) times daily with a meal.   levothyroxine 50 MCG tablet Commonly known as: SYNTHROID Take 50 mcg by mouth daily.   rosuvastatin 40 MG tablet Commonly known as: CRESTOR Take 1 tablet (40 mg total) by mouth daily at 6 PM.        DISCHARGE INSTRUCTIONS:   Follow-up PMD 5 days Follow-up cardiology as outpatient  If you experience worsening of your admission symptoms, develop shortness of breath,  life threatening emergency, suicidal or homicidal thoughts you must seek medical attention immediately by calling 911 or calling your MD immediately  if symptoms less severe.  You Must read complete instructions/literature along with all the possible adverse reactions/side effects for all the Medicines you take and that have been prescribed to you. Take any new Medicines after you have completely understood and accept all the possible adverse reactions/side  effects.   Please note  You were cared for by a hospitalist during your hospital stay. If you have any questions about your discharge medications or the care you received while you were in the hospital after you are discharged, you can call the unit and asked to speak with the hospitalist on call if the hospitalist that took care of you is not available. Once you are discharged, your primary care physician will handle any further medical issues. Please note that NO REFILLS for any discharge medications will be authorized once you are discharged, as it is imperative that you return to your primary care physician (or establish a relationship with a primary care physician if you do not have one) for your aftercare needs so that they can reassess your need for medications and monitor your lab values.    Today   CHIEF COMPLAINT:   Chief Complaint  Patient presents with  . code stroke    HISTORY OF PRESENT ILLNESS:  Jenna [name]  is a 80 y.o. female came in with headache, right-sided visual problems and speech abnormalities   VITAL SIGNS:  Blood pressure 140/68, pulse 76, temperature 97.8 F (36.6 C), temperature source Oral, resp. rate 12, height 5\' 5"  (1.651 m), weight 84.4 kg, SpO2 100 %.  I/O:    Intake/Output Summary (Last 24 hours) at 05/14/2019 1248 Last data filed at 05/14/2019 0900 Gross per 24 hour  Intake 390.12 ml  Output --  Net 390.12 ml    PHYSICAL EXAMINATION:  GENERAL:  79 y.o.-year-old patient lying in the bed with no acute distress.  EYES: Pupils equal, round, reactive to light and accommodation. No scleral icterus. Extraocular muscles intact.  HEENT: Head atraumatic, normocephalic. Oropharynx and nasopharynx clear.  NECK:  Supple, no jugular venous distention. No thyroid enlargement, no tenderness.  LUNGS: Normal breath sounds bilaterally, no wheezing, rales,rhonchi or crepitation. No use of accessory muscles of respiration.  CARDIOVASCULAR: S1, S2 normal. No  murmurs, rubs, or gallops.  ABDOMEN: Soft, non-tender, non-distended. Bowel sounds present. No organomegaly or mass.  EXTREMITIES: No pedal edema, cyanosis, or clubbing.  NEUROLOGIC: Cranial nerves II through XII are intact. Muscle strength 5/5 in all extremities. Sensation intact. Gait not checked.  PSYCHIATRIC: The patient is alert and oriented x 3.  SKIN: No obvious rash, lesion, or ulcer.   DATA REVIEW:   CBC Recent Labs  Lab 05/13/19 0449  WBC 7.0  HGB 11.8*  HCT 35.8*  PLT 218    Chemistries  Recent Labs  Lab 05/12/19 1406 05/12/19 1406 05/13/19 0449 05/13/19 0449 05/14/19 0439  NA 136   < > 138   < > 140  K 3.9   < > 3.8   < > 3.9  CL 103   < > 108   < > 107  CO2 24   < > 22   < > 24  GLUCOSE 102*   < > 98   < > 113*  BUN 27*   < > 24*   < > 19  CREATININE 1.15*   < > 1.06*   < >  1.10*  CALCIUM 9.3   < > 8.6*   < > 9.1  MG  --   --  2.2  --   --   AST 16  --   --   --   --   ALT 10  --   --   --   --   ALKPHOS 44  --   --   --   --   BILITOT 0.5  --   --   --   --    < > = values in this interval not displayed.    Microbiology Results  Results for orders placed or performed during the hospital encounter of 05/12/19  Respiratory Panel by RT PCR (Flu A&B, Covid) - Nasopharyngeal Swab     Status: None   Collection Time: 05/12/19  3:29 PM   Specimen: Nasopharyngeal Swab  Result Value Ref Range Status   SARS Coronavirus 2 by RT PCR NEGATIVE NEGATIVE Final    Comment: (NOTE) SARS-CoV-2 target nucleic acids are NOT DETECTED. The SARS-CoV-2 RNA is generally detectable in upper respiratoy specimens during the acute phase of infection. The lowest concentration of SARS-CoV-2 viral copies this assay can detect is 131 copies/mL. A negative result does not preclude SARS-Cov-2 infection and should not be used as the sole basis for treatment or other patient management decisions. A negative result may occur with  improper specimen collection/handling, submission of  specimen other than nasopharyngeal swab, presence of viral mutation(s) within the areas targeted by this assay, and inadequate number of viral copies (<131 copies/mL). A negative result must be combined with clinical observations, patient history, and epidemiological information. The expected result is Negative. Fact Sheet for Patients:  PinkCheek.be Fact Sheet for Healthcare Providers:  GravelBags.it This test is not yet ap proved or cleared by the [place] FDA and  has been authorized for detection and/or diagnosis of SARS-CoV-2 by FDA under an Emergency Use Authorization (EUA). This EUA will remain  in effect (meaning this test can be used) for the duration of the COVID-19 declaration under Section 564(b)(1) of the Act, 21 U.S.C. section 360bbb-3(b)(1), unless the authorization is terminated or revoked sooner.    Influenza A by PCR NEGATIVE NEGATIVE Final   Influenza B by PCR NEGATIVE NEGATIVE Final    Comment: (NOTE) The Xpert Xpress SARS-CoV-2/FLU/RSV assay is intended as an aid in  the diagnosis of influenza from Nasopharyngeal swab specimens and  should not be used as a sole basis for treatment. Nasal washings and  aspirates are unacceptable for Xpert Xpress SARS-CoV-2/FLU/RSV  testing. Fact Sheet for Patients: PinkCheek.be Fact Sheet for Healthcare Providers: GravelBags.it This test is not yet approved or cleared by the [place] FDA and  has been authorized for detection and/or diagnosis of SARS-CoV-2 by  FDA under an Emergency Use Authorization (EUA). This EUA will remain  in effect (meaning this test can be used) for the duration of the  Covid-19 declaration under Section 564(b)(1) of the Act, 21  U.S.C. section 360bbb-3(b)(1), unless the authorization is  terminated or revoked. Performed at [facility], [address]  [place], [place] [address]   MRSA PCR Screening     Status: None   Collection Time: 05/12/19  4:49 PM   Specimen: Nasopharyngeal  Result Value Ref Range Status   MRSA by PCR NEGATIVE NEGATIVE Final    Comment:        The GeneXpert MRSA Assay (FDA approved for NASAL specimens only), is one component of  a comprehensive MRSA colonization surveillance program. It is not intended to diagnose MRSA infection nor to guide or monitor treatment for MRSA infections. Performed at [facility], [address] [place], [place] [address]   Culture, blood (routine x 2)     Status: None (Preliminary result)   Collection Time: 05/12/19  4:50 PM   Specimen: BLOOD  Result Value Ref Range Status   Specimen Description BLOOD LEFT ANTECUBITAL  Final   Special Requests   Final    BOTTLES DRAWN AEROBIC AND ANAEROBIC Blood Culture results may not be optimal due to an inadequate volume of blood received in culture bottles   Culture   Final    NO GROWTH 2 DAYS Performed at [facility], [address] [place], [place] [address]    Report Status PENDING  Incomplete  Culture, blood (routine x 2)     Status: None (Preliminary result)   Collection Time: 05/12/19  4:50 PM   Specimen: BLOOD  Result Value Ref Range Status   Specimen Description BLOOD BLOOD LEFT FOREARM  Final   Special Requests   Final    BOTTLES DRAWN AEROBIC AND ANAEROBIC Blood Culture results may not be optimal due to an inadequate volume of blood received in culture bottles   Culture   Final    NO GROWTH 2 DAYS Performed at [facility], [address], [place], [place] [address]    Report Status PENDING  Incomplete    RADIOLOGY:  CT HEAD WO CONTRAST  Result Date: 05/13/2019 CLINICAL DATA:  Stroke follow-up status post tPA. EXAM: CT HEAD WITHOUT CONTRAST TECHNIQUE: Contiguous axial images were obtained from the base of the skull through the vertex without intravenous contrast. COMPARISON:  Head CT and MRI  05/12/2019 FINDINGS: Brain: A 2 cm region of cortically based hypoattenuation in the left temporo-occipital region corresponds to an acute infarct on MRI. Other smaller acute infarcts on MRI are not well demonstrated by CT. There is a background of mild chronic small vessel ischemia in the cerebral white matter with a chronic lacunar infarct again noted in the right thalamus. No intracranial hemorrhage, midline shift, or extra-axial fluid collection is identified. The ventricles and sulci are within normal limits for age. A small meningioma along the left anterior clinoid process is better demonstrated on MRI. Vascular: Calcified atherosclerosis at the skull base. No hyperdense vessel. Skull: No fracture or suspicious osseous lesion. Sinuses/Orbits: Mild left sphenoid sinus mucosal thickening. No significant mastoid fluid. Unremarkable orbits. Other: None. IMPRESSION: 1. Acute left temporo-occipital infarct with other smaller infarcts on MRI not well demonstrated by CT. 2. No intracranial hemorrhage. Electronically Signed   By: Logan Bores M.D.   On: 05/13/2019 10:16   CT HEAD WO CONTRAST  Result Date: 05/12/2019 CLINICAL DATA:  Stroke follow-up.  Worsening headache. EXAM: CT HEAD WITHOUT CONTRAST TECHNIQUE: Contiguous axial images were obtained from the base of the skull through the vertex without intravenous contrast. COMPARISON:  Earlier today FINDINGS: Brain: No evidence of acute cortical infarction, hemorrhage, hydrocephalus, extra-axial collection or mass lesion/mass effect. Right lacunar infarct at the thalamus as noted on prior. Vascular: Atherosclerotic calcification.  No hyperdense vessel. Skull: Normal. Negative for fracture or focal lesion. Sinuses/Orbits: Negative Other: These results were communicated to [place] at 3:27 pmon 2/7/2021by text page via the [facility] messaging system. IMPRESSION: 1. Stable head CT. 2. Lacunar infarct at the right thalamus. Electronically Signed   By: Monte Fantasia M.D.    On: 05/12/2019 15:28  MR BRAIN WO CONTRAST  Result Date: 05/12/2019 CLINICAL DATA:  Aphasia stroke presentation. EXAM: MRI HEAD WITHOUT CONTRAST TECHNIQUE: Multiplanar, multiecho pulse sequences of the brain and surrounding structures were obtained without intravenous contrast. COMPARISON:  CT same day FINDINGS: Brain: Diffusion imaging shows a punctate acute infarction in the left cerebellum. There is a confluent 2 cm infarction at the left temporoparietal junction. Few other scattered punctate acute infarctions in the near by the left parietal lobe. Punctate acute infarction higher in the non contiguous left parietal lobe. Punctate acute infarction in the high right parietal lobe. This pattern of infarctions suggests embolic disease from the heart or ascending aorta. No evidence of mass effect or hemorrhage. Elsewhere, there are mild chronic small-vessel ischemic changes of the pons. Old lacunar infarction right thalamus. Mild chronic small-vessel change affects the cerebral hemispheric white matter. There is a small meningioma along the clinoid process on the left, no larger than 11 mm. No mass-effect upon the brain. No hemorrhage, hydrocephalus or extra-axial collection. Vascular: Major vessels at the base of the brain show flow. Skull and upper cervical spine: Negative Sinuses/Orbits: Mucosal inflammatory change of the left division of the sphenoid sinus. Other sinuses are clear. Orbits negative. 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 Most extensive area of involvement is in the left temporoparietal junction where there is a confluent 2 cm infarction. Other smaller infarctions seen in the left temporoparietal junction region and left parietal lobe. Punctate acute infarction present in the right parietal lobe. No evidence of hemorrhage. Background pattern of mild chronic small-vessel disease elsewhere as described above. 11 mm  meningioma of the clinoid process on the left without mass-effect upon the brain. Electronically Signed   By: Nelson Chimes M.D.   On: 05/12/2019 23:56   US Carotid Bilateral (at [facility] and [facility] only)  Result Date: 05/13/2019 CLINICAL DATA:  79 year old female with recent stroke (multifocal acute infarctions consistent with embolic disease). EXAM: BILATERAL CAROTID DUPLEX ULTRASOUND TECHNIQUE: Pearline Cables scale imaging, color Doppler and duplex ultrasound were performed of bilateral carotid and vertebral arteries in the neck. COMPARISON:  MRI brain 05/12/2019 FINDINGS: Criteria: Quantification of carotid stenosis is based on velocity parameters that correlate the residual internal carotid diameter with NASCET-based stenosis levels, using the diameter of the distal internal carotid lumen as the denominator for stenosis measurement. The following velocity measurements were obtained: RIGHT ICA: 50/18 cm/sec CCA: 54/27 cm/sec SYSTOLIC ICA/CCA RATIO:  0.7 ECA:  53 cm/sec LEFT ICA: 65/21 cm/sec CCA: 06/23 cm/sec SYSTOLIC ICA/CCA RATIO:  1.0 ECA:  96 cm/sec RIGHT CAROTID ARTERY: No significant atherosclerotic plaque or evidence of stenosis. RIGHT VERTEBRAL ARTERY:  Patent with antegrade flow. LEFT CAROTID ARTERY: Mild smooth heterogeneous atherosclerotic plaque in the proximal internal carotid artery. By peak systolic velocity criteria, the estimated stenosis is less than 50%. LEFT VERTEBRAL ARTERY:  Patent with normal antegrade flow. IMPRESSION: 1. Mild (1-49%) stenosis proximal left internal carotid artery secondary to mild smooth heterogeneous atherosclerotic plaque. 2. No significant atherosclerotic plaque or evidence of stenosis in the right internal carotid artery. 3. Vertebral arteries are patent with normal antegrade flow. Signed, Criselda Peaches, MD, [facility] Electronically Signed   By: Jacqulynn Cadet M.D.   On: 05/13/2019 10:49   DG Chest Port 1  View  Result Date: 05/13/2019 CLINICAL DATA:  79 year old female with code stroke presentation and brain MRI revealing scattered small probable embolic cerebral and cerebellar infarcts.  EXAM: PORTABLE CHEST 1 VIEW COMPARISON:  Chest radiographs 12/21/2005. FINDINGS: Portable AP semi upright view at 0412 hours. Cardiomegaly is new since 2007. Other mediastinal contours are within normal limits. Stable lung volumes. Chronic right upper lobe calcified granuloma, benign. Elsewhere when allowing for portable technique the lungs are clear. Visualized tracheal air column is within normal limits. No pneumothorax or pleural effusion. No acute osseous abnormality identified. IMPRESSION: 1. Cardiomegaly suspected and new since 2007. 2. No acute pulmonary abnormality. Electronically Signed   By: Genevie Ann M.D.   On: 05/13/2019 05:37   ECHOCARDIOGRAM COMPLETE  Result Date: 05/13/2019    ECHOCARDIOGRAM REPORT   Patient Name:   Jenna [name] Date of Exam: 05/13/2019 Medical Rec #:  [id number]        Height:       65.0 in Accession #:    [id number]       Weight:       175.9 lb Date of Birth:  1940-09-04        BSA:          1.87 m Patient Age:    89 years         BP:           110/77 mmHg Patient Gender: F                HR:           72 bpm. Exam Location:  [facility] Procedure: 2D Echo, Color Doppler, Cardiac Doppler and Saline Contrast Bubble            Study Indications:     I163.9 Stroke  History:         Patient has no prior history of Echocardiogram examinations.                  Risk Factors:Hypertension and Dyslipidemia.  Sonographer:     Charmayne Sheer RDCS (AE) Referring Phys:  [place] Diagnosing Phys: Kathlyn Sacramento MD IMPRESSIONS  1. Left ventricular ejection fraction, by estimation, is 55 to 60%. The left ventricle has normal function. Left ventricular diastolic parameters are consistent with Grade I diastolic dysfunction (impaired relaxation).  2. Right ventricular systolic function is normal. Tricuspid  regurgitation signal is inadequate for assessing RVSP.  3. Agitated saline contrast bubble study was negative, with no evidence of any interatrial shunt. Conclusion(s)/Recomendation(s): No intracardiac source of embolism detected on this transthoracic study. FINDINGS  Left Ventricle: Left ventricular ejection fraction, by estimation, is 55 to 60%. The left ventricle has normal function. The left ventricle has no regional wall motion abnormalities. The left ventricular internal cavity size was normal in size. There is  no left ventricular hypertrophy. Right Ventricle: The right ventricular size is normal. No increase in right ventricular wall thickness. Right ventricular systolic function is normal. Tricuspid regurgitation signal is inadequate for assessing RVSP. Left Atrium: Left atrial size was normal in size. Right Atrium: Right atrial size was normal in size. Pericardium: There is no evidence of pericardial effusion. Mitral Valve: The mitral valve is normal in structure and function. Normal mobility of the mitral valve leaflets. No evidence of mitral valve regurgitation. No evidence of mitral valve stenosis. MV peak gradient, 4.4 mmHg. The mean mitral valve gradient is 3.0 mmHg. Tricuspid Valve: The tricuspid valve is normal in structure. Tricuspid valve regurgitation is not demonstrated. No evidence of tricuspid stenosis. Aortic Valve: The aortic valve is normal in structure and function. Aortic valve regurgitation is not visualized. No aortic  stenosis is present. Aortic valve mean gradient measures 4.0 mmHg. Aortic valve peak gradient measures 9.6 mmHg. Aortic valve area, by VTI measures 2.84 cm. Pulmonic Valve: The pulmonic valve was normal in structure. Pulmonic valve regurgitation is not visualized. No evidence of pulmonic stenosis. Aorta: The aortic root is normal in size and structure. Pulmonary Artery: The pulmonary artery is of normal size. Venous: The left upper pulmonary vein, left lower pulmonary  vein, right upper pulmonary vein and right lower pulmonary vein are normal. The inferior vena cava is normal in size with greater than 50% respiratory variability, suggesting right atrial pressure of 3 mmHg. IAS/Shunts: No atrial level shunt detected by color flow Doppler. Agitated saline contrast was given intravenously to evaluate for intracardiac shunting. Agitated saline contrast bubble study was negative, with no evidence of any interatrial shunt.  LEFT VENTRICLE PLAX 2D LVIDd:         4.90 cm  Diastology LVIDs:         3.11 cm  LV e' lateral:   7.62 cm/s LV PW:         0.82 cm  LV E/e' lateral: 12.1 LV IVS:        0.49 cm  LV e' medial:    7.72 cm/s LVOT diam:     2.10 cm  LV E/e' medial:  11.9 LV SV:         76.55 ml LV SV Index:   38.53 LVOT Area:     3.46 cm  RIGHT VENTRICLE RV Basal diam:  3.20 cm LEFT ATRIUM           Index       RIGHT ATRIUM           Index LA diam:      2.20 cm 1.17 cm/m  RA Area:     11.40 cm LA Vol (A2C): 42.4 ml 22.64 ml/m RA Volume:   19.90 ml  10.62 ml/m LA Vol (A4C): 18.6 ml 9.93 ml/m  AORTIC VALVE                   PULMONIC VALVE AV Area (Vmax):    2.35 cm    PV Vmax:       0.90 m/s AV Area (Vmean):   2.44 cm    PV Vmean:      63.400 cm/s AV Area (VTI):     2.84 cm    PV VTI:        0.182 m AV Vmax:           155.00 cm/s PV Peak grad:  3.3 mmHg AV Vmean:          96.000 cm/s PV Mean grad:  2.0 mmHg AV VTI:            0.270 m AV Peak Grad:      9.6 mmHg AV Mean Grad:      4.0 mmHg LVOT Vmax:         105.00 cm/s LVOT Vmean:        67.700 cm/s LVOT VTI:          0.221 m LVOT/AV VTI ratio: 0.82  AORTA Ao Root diam: 3.00 cm MITRAL VALVE MV Area (PHT): 3.48 cm              SHUNTS MV Peak grad:  4.4 mmHg              Systemic VTI:  0.22 m MV Mean grad:  3.0 mmHg  Systemic Diam: 2.10 cm MV Vmax:       1.05 m/s MV Vmean:      80.4 cm/s MV Decel Time: 218 msec MV E velocity: 92.20 cm/s  103 cm/s MV A velocity: 112.00 cm/s 70.3 cm/s MV E/A ratio:  0.82        1.5  Kathlyn Sacramento MD Electronically signed by Kathlyn Sacramento MD Signature Date/Time: 05/13/2019/11:26:59 AM    Final    CT HEAD CODE STROKE WO CONTRAST  Addendum Date: 05/12/2019   ADDENDUM REPORT: 05/12/2019 14:17 ADDENDUM: Study discussed by telephone with Dr. Arta Silence on 05/12/2019 at 1407 hours. Electronically Signed   By: Genevie Ann M.D.   On: 05/12/2019 14:17   Result Date: 05/12/2019 CLINICAL DATA:  Code stroke. 79 year old female with sudden onset slurred speech. EXAM: CT HEAD WITHOUT CONTRAST TECHNIQUE: Contiguous axial images were obtained from the base of the skull through the vertex without intravenous contrast. COMPARISON:  None. FINDINGS: Brain: Normal cerebral volume. No midline shift, mass effect, or evidence of intracranial mass lesion. No ventriculomegaly. No acute intracranial hemorrhage identified. There is a small hypodensity in the right thalamus on series 2, image 15. This is age indeterminate. Minimal for age scattered white matter hypodensity elsewhere. No cortically based acute infarct identified. Vascular: Calcified atherosclerosis at the skull base. No suspicious intracranial vascular hyperdensity. Skull: Negative. Sinuses/Orbits: Mild mucosal thickening in the sphenoid sinuses, mild bubbly opacity on the left. Other Visualized paranasal sinuses and mastoids are stable and well pneumatized. Other: Visualized orbits and scalp soft tissues are within normal limits. ASPECTS [facility] Stroke Program Early CT Score) Total score (0-10 with 10 being normal): 10 IMPRESSION: 1. 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 Query acute left side weakness. 2. No superimposed acute cortically based infarct or acute intracranial hemorrhage identified. 3.  ASPECTS 10. Electronically Signed: By: Genevie Ann M.D. On: 05/12/2019 14:05     Management plans discussed with the patient, family and they are in agreement.  CODE STATUS:     Code Status Orders  (From admission, onward)          Start     Ordered   05/12/19 1633  Full code  Continuous     05/12/19 1632        Code Status History    Date Active Date Inactive Code Status Order ID Comments User Context   05/12/2019 1520 05/12/2019 4401 Full Code [id number]  Ottie Glazier, MD ED   Advance Care Planning Activity      TOTAL TIME TAKING CARE OF THIS PATIENT: 38 minutes.    Loletha Grayer M.D on 05/14/2019 at 12:48 PM  Between 7am to 6pm - Pager - [phone]  After 6pm go to [email or web address] - password [email or web address]  [facility]  CC: Primary care physician; Perrin Maltese, MD

## 2019-05-14 NOTE — Consult Note (Signed)
Subjective: Much improved this AM. Aphasia resolved. Patient feels she is close to baseline  Past Medical History:  Diagnosis Date  . Gout   . Hyperlipidemia   . Hypertension   . Thyroid disease     Past Surgical History:  Procedure Laterality Date  . BREAST BIOPSY Right 1978   neg  . CHOLECYSTECTOMY      Family History  Problem Relation Age of Onset  . Breast cancer Maternal Grandmother 80  . Breast cancer Cousin    Social History:  reports that she has been smoking cigarettes. She has been smoking about 0.50 packs per day. She has never used smokeless tobacco. She reports that she does not drink alcohol or use drugs.  Allergies: No Known Allergies  Medications: I have reviewed the patient's current medications. Prior to Admission:  Prior to Admission medications   Medication Sig Start Date End Date Taking? Authorizing Provider  ALLOPURINOL PO Take by mouth.    [provider]  Efinaconazole (JUBLIA) 10 % SOLN Apply 1 drop topically 1 day or 1 dose. 11/01/13   Hyatt, Max T, DPM  Olmesartan Medoxomil (BENICAR PO) Take by mouth.    [provider]  Rosuvastatin Calcium (CRESTOR PO) Take by mouth.    [provider]    Physical Examination: Blood pressure 140/68, pulse 76, temperature 97.8 F (36.6 C), temperature source Oral, resp. rate 12, height 5\' 5"  (1.651 m), weight 84.4 kg, SpO2 100 %.  HEENT-  Normocephalic, no lesions, without obvious abnormality.  Normal external eye and conjunctiva.  Normal TM's bilaterally.  Normal auditory canals and external ears. Normal external nose, mucus membranes and septum.  Normal pharynx. Cardiovascular- S1, S2 normal, pulses palpable throughout   Lungs- chest clear, no wheezing, rales, normal symmetric air entry Abdomen- soft, non-tender; bowel sounds normal; no masses,  no organomegaly Extremities- LE edema Lymph-no adenopathy palpable Musculoskeletal-no joint tenderness, deformity or swelling Skin-warm  and dry, no hyperpigmentation, vitiligo, or suspicious lesions  Neurological Examination   Mental Status: Alert, oriented, thought content appropriate. Fluent speech.  Able to follow simple commands but requires reinforcement to follow 3-step commands. Cranial Nerves: II: Discs flat bilaterally; Visual fields grossly normal, pupils equal, round, reactive to light and accommodation III,IV, VI: ptosis not present, extra-ocular motions intact bilaterally V,VII: smile symmetric, facial light touch sensation normal bilaterally VIII: hearing normal bilaterally IX,X: gag reflex present XI: bilateral shoulder shrug XII: midline tongue extension Motor: Right : Upper extremity   5/5    Left:     Upper extremity   5/5  Lower extremity   5/5     Lower extremity   5/5 Tone and bulk:normal tone throughout; no atrophy noted Sensory: Pinprick and light touch intact throughout, bilaterally Deep Tendon Reflexes: Symmetric throughout Plantars: Right: mute   Left: mute Cerebellar: Normal finger-to-nose and normal heel-to-shin testing bilaterally Gait: not tested due to safety concerns    Laboratory Studies:  Basic Metabolic Panel: Recent Labs  Lab 05/12/19 1406 05/13/19 0449 05/14/19 0439  NA 136 138 140  K 3.9 3.8 3.9  CL 103 108 107  CO2 24 22 24   GLUCOSE 102* 98 113*  BUN 27* 24* 19  CREATININE 1.15* 1.06* 1.10*  CALCIUM 9.3 8.6* 9.1  MG  --  2.2  --     Liver Function Tests: Recent Labs  Lab 05/12/19 1406  AST 16  ALT 10  ALKPHOS 44  BILITOT 0.5  PROT 7.5  ALBUMIN 4.4   No results for  input(s): LIPASE, AMYLASE in the last 168 hours. No results for input(s): AMMONIA in the last 168 hours.  CBC: Recent Labs  Lab 05/12/19 1406 05/13/19 0449  WBC 7.7 7.0  NEUTROABS 4.1  --   HGB 12.9 11.8*  HCT 40.1 35.8*  MCV 92.6 92.5  PLT 257 218    Cardiac Enzymes: No results for input(s): CKTOTAL, CKMB, CKMBINDEX, TROPONINI in the last 168 hours.  BNP: Invalid input(s):  POCBNP  CBG: Recent Labs  Lab 05/12/19 1350 05/12/19 1649  GLUCAP 92 106*    Microbiology: Results for orders placed or performed during the hospital encounter of 05/12/19  Respiratory Panel by RT PCR (Flu A&B, Covid) - Nasopharyngeal Swab     Status: None   Collection Time: 05/12/19  3:29 PM   Specimen: Nasopharyngeal Swab  Result Value Ref Range Status   SARS Coronavirus 2 by RT PCR NEGATIVE NEGATIVE Final    Comment: (NOTE) SARS-CoV-2 target nucleic acids are NOT DETECTED. The SARS-CoV-2 RNA is generally detectable in upper respiratoy specimens during the acute phase of infection. The lowest concentration of SARS-CoV-2 viral copies this assay can detect is 131 copies/mL. A negative result does not preclude SARS-Cov-2 infection and should not be used as the sole basis for treatment or other patient management decisions. A negative result may occur with  improper specimen collection/handling, submission of specimen other than nasopharyngeal swab, presence of viral mutation(s) within the areas targeted by this assay, and inadequate number of viral copies (<131 copies/mL). A negative result must be combined with clinical observations, patient history, and epidemiological information. The expected result is Negative. Fact Sheet for Patients:  PinkCheek.be Fact Sheet for Healthcare Providers:  GravelBags.it This test is not yet ap proved or cleared by the [place] FDA and  has been authorized for detection and/or diagnosis of SARS-CoV-2 by FDA under an Emergency Use Authorization (EUA). This EUA will remain  in effect (meaning this test can be used) for the duration of the COVID-19 declaration under Section 564(b)(1) of the Act, 21 U.S.C. section 360bbb-3(b)(1), unless the authorization is terminated or revoked sooner.    Influenza A by PCR NEGATIVE NEGATIVE Final   Influenza B by PCR NEGATIVE NEGATIVE Final     Comment: (NOTE) The Xpert Xpress SARS-CoV-2/FLU/RSV assay is intended as an aid in  the diagnosis of influenza from Nasopharyngeal swab specimens and  should not be used as a sole basis for treatment. Nasal washings and  aspirates are unacceptable for Xpert Xpress SARS-CoV-2/FLU/RSV  testing. Fact Sheet for Patients: PinkCheek.be Fact Sheet for Healthcare Providers: GravelBags.it This test is not yet approved or cleared by the [place] FDA and  has been authorized for detection and/or diagnosis of SARS-CoV-2 by  FDA under an Emergency Use Authorization (EUA). This EUA will remain  in effect (meaning this test can be used) for the duration of the  Covid-19 declaration under Section 564(b)(1) of the Act, 21  U.S.C. section 360bbb-3(b)(1), unless the authorization is  terminated or revoked. Performed at [facility], [address] [place], [place] [address]   MRSA PCR Screening     Status: None   Collection Time: 05/12/19  4:49 PM   Specimen: Nasopharyngeal  Result Value Ref Range Status   MRSA by PCR NEGATIVE NEGATIVE Final    Comment:        The GeneXpert MRSA Assay (FDA approved for NASAL specimens only), is one component of a comprehensive MRSA colonization surveillance program. It is not intended to  diagnose MRSA infection nor to guide or monitor treatment for MRSA infections. Performed at [facility], [address] [place], [place] [address]   Culture, blood (routine x 2)     Status: None (Preliminary result)   Collection Time: 05/12/19  4:50 PM   Specimen: BLOOD  Result Value Ref Range Status   Specimen Description BLOOD LEFT ANTECUBITAL  Final   Special Requests   Final    BOTTLES DRAWN AEROBIC AND ANAEROBIC Blood Culture results may not be optimal due to an inadequate volume of blood received in culture bottles   Culture   Final    NO GROWTH 2 DAYS Performed at [facility], [address], [place], [place] [address]    Report Status PENDING  Incomplete  Culture, blood (routine x 2)     Status: None (Preliminary result)   Collection Time: 05/12/19  4:50 PM   Specimen: BLOOD  Result Value Ref Range Status   Specimen Description BLOOD BLOOD LEFT FOREARM  Final   Special Requests   Final    BOTTLES DRAWN AEROBIC AND ANAEROBIC Blood Culture results may not be optimal due to an inadequate volume of blood received in culture bottles   Culture   Final    NO GROWTH 2 DAYS Performed at [facility] [address], [address] [place], [place] [address]    Report Status PENDING  Incomplete    Coagulation Studies: Recent Labs    05/12/19 1406 05/12/19 1650  LABPROT 12.8 13.2  INR 1.0 1.0    Urinalysis: No results for input(s): COLORURINE, LABSPEC, PHURINE, GLUCOSEU, HGBUR, BILIRUBINUR, KETONESUR, PROTEINUR, UROBILINOGEN, NITRITE, LEUKOCYTESUR in the last 168 hours.  Invalid input(s): APPERANCEUR  Lipid Panel:    Component Value Date/Time   CHOL 178 05/13/2019 0449   TRIG 236 (H) 05/13/2019 0449   HDL 28 (L) 05/13/2019 0449   CHOLHDL 6.4 05/13/2019 0449   VLDL 47 (H) 05/13/2019 0449   LDLCALC 103 (H) 05/13/2019 0449    HgbA1C:  Lab Results  Component Value Date   HGBA1C 5.6 05/13/2019    Urine Drug Screen:  No results found for: LABOPIA, COCAINSCRNUR, LABBENZ, AMPHETMU, THCU, LABBARB  Alcohol Level: No results for input(s): ETH in the last 168 hours.  Other results: EKG: sinus rhythm in the 80s  Imaging: CT HEAD WO CONTRAST  Result Date: 05/13/2019 CLINICAL DATA:  Stroke follow-up status post tPA. EXAM: CT HEAD WITHOUT CONTRAST TECHNIQUE: Contiguous axial images were obtained from the base of the skull through the vertex without intravenous contrast. COMPARISON:  Head CT and MRI 05/12/2019 FINDINGS: Brain: A 2 cm region of cortically based hypoattenuation in the left temporo-occipital region corresponds to an acute infarct on MRI. Other  smaller acute infarcts on MRI are not well demonstrated by CT. There is a background of mild chronic small vessel ischemia in the cerebral white matter with a chronic lacunar infarct again noted in the right thalamus. No intracranial hemorrhage, midline shift, or extra-axial fluid collection is identified. The ventricles and sulci are within normal limits for age. A small meningioma along the left anterior clinoid process is better demonstrated on MRI. Vascular: Calcified atherosclerosis at the skull base. No hyperdense vessel. Skull: No fracture or suspicious osseous lesion. Sinuses/Orbits: Mild left sphenoid sinus mucosal thickening. No significant mastoid fluid. Unremarkable orbits. Other: None. IMPRESSION: 1. Acute left temporo-occipital infarct with other smaller infarcts on MRI not well demonstrated by CT. 2. No intracranial hemorrhage. Electronically Signed   By: Logan Bores  M.D.   On: 05/13/2019 10:16   CT HEAD WO CONTRAST  Result Date: 05/12/2019 CLINICAL DATA:  Stroke follow-up.  Worsening headache. EXAM: CT HEAD WITHOUT CONTRAST TECHNIQUE: Contiguous axial images were obtained from the base of the skull through the vertex without intravenous contrast. COMPARISON:  Earlier today FINDINGS: Brain: No evidence of acute cortical infarction, hemorrhage, hydrocephalus, extra-axial collection or mass lesion/mass effect. Right lacunar infarct at the thalamus as noted on prior. Vascular: Atherosclerotic calcification.  No hyperdense vessel. Skull: Normal. Negative for fracture or focal lesion. Sinuses/Orbits: Negative Other: These results were communicated to [place] at 3:27 pmon 2/7/2021by text page via the [facility] messaging system. IMPRESSION: 1. Stable head CT. 2. Lacunar infarct at the right thalamus. Electronically Signed   By: Monte Fantasia M.D.   On: 05/12/2019 15:28   MR BRAIN WO CONTRAST  Result Date: 05/12/2019 CLINICAL DATA:  Aphasia stroke presentation. EXAM: MRI HEAD WITHOUT CONTRAST TECHNIQUE:  Multiplanar, multiecho pulse sequences of the brain and surrounding structures were obtained without intravenous contrast. COMPARISON:  CT same day FINDINGS: Brain: Diffusion imaging shows a punctate acute infarction in the left cerebellum. There is a confluent 2 cm infarction at the left temporoparietal junction. Few other scattered punctate acute infarctions in the near by the left parietal lobe. Punctate acute infarction higher in the non contiguous left parietal lobe. Punctate acute infarction in the high right parietal lobe. This pattern of infarctions suggests embolic disease from the heart or ascending aorta. No evidence of mass effect or hemorrhage. Elsewhere, there are mild chronic small-vessel ischemic changes of the pons. Old lacunar infarction right thalamus. Mild chronic small-vessel change affects the cerebral hemispheric white matter. There is a small meningioma along the clinoid process on the left, no larger than 11 mm. No mass-effect upon the brain. No hemorrhage, hydrocephalus or extra-axial collection. Vascular: Major vessels at the base of the brain show flow. Skull and upper cervical spine: Negative Sinuses/Orbits: Mucosal inflammatory change of the left division of the sphenoid sinus. Other sinuses are clear. Orbits negative. 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 Most extensive area of involvement is in the left temporoparietal junction where there is a confluent 2 cm infarction. Other smaller infarctions seen in the left temporoparietal junction region and left parietal lobe. Punctate acute infarction present in the right parietal lobe. No evidence of hemorrhage. Background pattern of mild chronic small-vessel disease elsewhere as described above. 11 mm meningioma of the clinoid process on the left without mass-effect upon the brain. Electronically Signed   By: Nelson Chimes M.D.   On: 05/12/2019 23:56   US  Carotid Bilateral (at [facility] and [facility] only)  Result Date: 05/13/2019 CLINICAL DATA:  79 year old female with recent stroke (multifocal acute infarctions consistent with embolic disease). EXAM: BILATERAL CAROTID DUPLEX ULTRASOUND TECHNIQUE: Pearline Cables scale imaging, color Doppler and duplex ultrasound were performed of bilateral carotid and vertebral arteries in the neck. COMPARISON:  MRI brain 05/12/2019 FINDINGS: Criteria: Quantification of carotid stenosis is based on velocity parameters that correlate the residual internal carotid diameter with NASCET-based stenosis levels, using the diameter of the distal internal carotid lumen as the denominator for stenosis measurement. The following velocity measurements were obtained: RIGHT ICA: 50/18 cm/sec CCA: 16/10 cm/sec SYSTOLIC ICA/CCA RATIO:  0.7 ECA:  53 cm/sec LEFT ICA: 65/21 cm/sec CCA: 96/04 cm/sec SYSTOLIC ICA/CCA RATIO:  1.0 ECA:  96 cm/sec RIGHT CAROTID ARTERY: No significant atherosclerotic plaque or evidence of stenosis. RIGHT VERTEBRAL ARTERY:  Patent with antegrade flow. LEFT CAROTID ARTERY: Mild smooth heterogeneous atherosclerotic plaque in the proximal internal carotid artery. By peak systolic velocity criteria, the estimated stenosis is less than 50%. LEFT VERTEBRAL ARTERY:  Patent with normal antegrade flow. IMPRESSION: 1. Mild (1-49%) stenosis proximal left internal carotid artery secondary to mild smooth heterogeneous atherosclerotic plaque. 2. No significant atherosclerotic plaque or evidence of stenosis in the right internal carotid artery. 3. Vertebral arteries are patent with normal antegrade flow. Signed, Criselda Peaches, MD, [facility] Radiology Electronically Signed   By: Jacqulynn Cadet M.D.   On: 05/13/2019 10:49   DG Chest Port 1 View  Result Date: 05/13/2019 CLINICAL DATA:  79 year old female with code stroke presentation and brain MRI revealing scattered small probable embolic  cerebral and cerebellar infarcts. EXAM: PORTABLE CHEST 1 VIEW COMPARISON:  Chest radiographs 12/21/2005. FINDINGS: Portable AP semi upright view at 0412 hours. Cardiomegaly is new since 2007. Other mediastinal contours are within normal limits. Stable lung volumes. Chronic right upper lobe calcified granuloma, benign. Elsewhere when allowing for portable technique the lungs are clear. Visualized tracheal air column is within normal limits. No pneumothorax or pleural effusion. No acute osseous abnormality identified. IMPRESSION: 1. Cardiomegaly suspected and new since 2007. 2. No acute pulmonary abnormality. Electronically Signed   By: Genevie Ann M.D.   On: 05/13/2019 05:37   ECHOCARDIOGRAM COMPLETE  Result Date: 05/13/2019    ECHOCARDIOGRAM REPORT   Patient Name:   CLAYTON JARMON Date of Exam: 05/13/2019 Medical Rec #:  [id number]        Height:       65.0 in Accession #:    [id number]       Weight:       175.9 lb Date of Birth:  1940-05-05        BSA:          1.87 m Patient Age:    42 years         BP:           110/77 mmHg Patient Gender: F                HR:           72 bpm. Exam Location:  [facility] Procedure: 2D Echo, Color Doppler, Cardiac Doppler and Saline Contrast Bubble            Study Indications:     I163.9 Stroke  History:         Patient has no prior history of Echocardiogram examinations.                  Risk Factors:Hypertension and Dyslipidemia.  Sonographer:     Charmayne Sheer RDCS (AE) Referring Phys:  [place] Diagnosing Phys: Kathlyn Sacramento MD IMPRESSIONS  1. Left ventricular ejection fraction, by estimation, is 55 to 60%. The left ventricle has normal function. Left ventricular diastolic parameters are consistent with Grade I diastolic dysfunction (impaired relaxation).  2. Right ventricular systolic function is normal. Tricuspid regurgitation signal is inadequate for assessing RVSP.  3. Agitated saline contrast bubble study was negative, with no evidence of any interatrial shunt.  Conclusion(s)/Recomendation(s): No intracardiac source of embolism detected on this transthoracic study. FINDINGS  Left Ventricle: Left ventricular ejection fraction, by estimation, is 55 to 60%. The left ventricle has normal function. The left ventricle has no regional wall motion abnormalities. The left ventricular internal cavity size was normal in size. There is  no  left ventricular hypertrophy. Right Ventricle: The right ventricular size is normal. No increase in right ventricular wall thickness. Right ventricular systolic function is normal. Tricuspid regurgitation signal is inadequate for assessing RVSP. Left Atrium: Left atrial size was normal in size. Right Atrium: Right atrial size was normal in size. Pericardium: There is no evidence of pericardial effusion. Mitral Valve: The mitral valve is normal in structure and function. Normal mobility of the mitral valve leaflets. No evidence of mitral valve regurgitation. No evidence of mitral valve stenosis. MV peak gradient, 4.4 mmHg. The mean mitral valve gradient is 3.0 mmHg. Tricuspid Valve: The tricuspid valve is normal in structure. Tricuspid valve regurgitation is not demonstrated. No evidence of tricuspid stenosis. Aortic Valve: The aortic valve is normal in structure and function. Aortic valve regurgitation is not visualized. No aortic stenosis is present. Aortic valve mean gradient measures 4.0 mmHg. Aortic valve peak gradient measures 9.6 mmHg. Aortic valve area, by VTI measures 2.84 cm. Pulmonic Valve: The pulmonic valve was normal in structure. Pulmonic valve regurgitation is not visualized. No evidence of pulmonic stenosis. Aorta: The aortic root is normal in size and structure. Pulmonary Artery: The pulmonary artery is of normal size. Venous: The left upper pulmonary vein, left lower pulmonary vein, right upper pulmonary vein and right lower pulmonary vein are normal. The inferior vena cava is normal in size with greater than 50% respiratory  variability, suggesting right atrial pressure of 3 mmHg. IAS/Shunts: No atrial level shunt detected by color flow Doppler. Agitated saline contrast was given intravenously to evaluate for intracardiac shunting. Agitated saline contrast bubble study was negative, with no evidence of any interatrial shunt.  LEFT VENTRICLE PLAX 2D LVIDd:         4.90 cm  Diastology LVIDs:         3.11 cm  LV e' lateral:   7.62 cm/s LV PW:         0.82 cm  LV E/e' lateral: 12.1 LV IVS:        0.49 cm  LV e' medial:    7.72 cm/s LVOT diam:     2.10 cm  LV E/e' medial:  11.9 LV SV:         76.55 ml LV SV Index:   38.53 LVOT Area:     3.46 cm  RIGHT VENTRICLE RV Basal diam:  3.20 cm LEFT ATRIUM           Index       RIGHT ATRIUM           Index LA diam:      2.20 cm 1.17 cm/m  RA Area:     11.40 cm LA Vol (A2C): 42.4 ml 22.64 ml/m RA Volume:   19.90 ml  10.62 ml/m LA Vol (A4C): 18.6 ml 9.93 ml/m  AORTIC VALVE                   PULMONIC VALVE AV Area (Vmax):    2.35 cm    PV Vmax:       0.90 m/s AV Area (Vmean):   2.44 cm    PV Vmean:      63.400 cm/s AV Area (VTI):     2.84 cm    PV VTI:        0.182 m AV Vmax:           155.00 cm/s PV Peak grad:  3.3 mmHg AV Vmean:          96.000 cm/s PV  Mean grad:  2.0 mmHg AV VTI:            0.270 m AV Peak Grad:      9.6 mmHg AV Mean Grad:      4.0 mmHg LVOT Vmax:         105.00 cm/s LVOT Vmean:        67.700 cm/s LVOT VTI:          0.221 m LVOT/AV VTI ratio: 0.82  AORTA Ao Root diam: 3.00 cm MITRAL VALVE MV Area (PHT): 3.48 cm              SHUNTS MV Peak grad:  4.4 mmHg              Systemic VTI:  0.22 m MV Mean grad:  3.0 mmHg              Systemic Diam: 2.10 cm MV Vmax:       1.05 m/s MV Vmean:      80.4 cm/s MV Decel Time: 218 msec MV E velocity: 92.20 cm/s  103 cm/s MV A velocity: 112.00 cm/s 70.3 cm/s MV E/A ratio:  0.82        1.5 Kathlyn Sacramento MD Electronically signed by Kathlyn Sacramento MD Signature Date/Time: 05/13/2019/11:26:59 AM    Final    CT HEAD CODE STROKE WO  CONTRAST  Addendum Date: 05/12/2019   ADDENDUM REPORT: 05/12/2019 14:17 ADDENDUM: Study discussed by telephone with Dr. Arta Silence on 05/12/2019 at 1407 hours. Electronically Signed   By: Genevie Ann M.D.   On: 05/12/2019 14:17   Result Date: 05/12/2019 CLINICAL DATA:  Code stroke. 79 year old female with sudden onset slurred speech. EXAM: CT HEAD WITHOUT CONTRAST TECHNIQUE: Contiguous axial images were obtained from the base of the skull through the vertex without intravenous contrast. COMPARISON:  None. FINDINGS: Brain: Normal cerebral volume. No midline shift, mass effect, or evidence of intracranial mass lesion. No ventriculomegaly. No acute intracranial hemorrhage identified. There is a small hypodensity in the right thalamus on series 2, image 15. This is age indeterminate. Minimal for age scattered white matter hypodensity elsewhere. No cortically based acute infarct identified. Vascular: Calcified atherosclerosis at the skull base. No suspicious intracranial vascular hyperdensity. Skull: Negative. Sinuses/Orbits: Mild mucosal thickening in the sphenoid sinuses, mild bubbly opacity on the left. Other Visualized paranasal sinuses and mastoids are stable and well pneumatized. Other: Visualized orbits and scalp soft tissues are within normal limits. ASPECTS [facility] Stroke Program Early CT Score) Total score (0-10 with 10 being normal): 10 IMPRESSION: 1. 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 Query acute left side weakness. 2. No superimposed acute cortically based infarct or acute intracranial hemorrhage identified. 3.  ASPECTS 10. Electronically Signed: By: Genevie Ann M.D. On: 05/12/2019 14:05    Assessment: 79 y.o. female with a history of HTN and HLD who presents with aphasia.  Had earlier symptoms of headache and right visual field loss.  Concern is for multiple vascular territories.  Although NIHSS is low at one, speech is providing a disability for patient.    - Patient is s/p TPA  2 days ago - Speech improved close to baseline - TTE negative - MRI with embolic looking strokes on L side more then R  - ASA 325 daily - out patient cardiology for TEE and possible holter - d/c planning today - discussed with patient and husband

## 2019-05-14 NOTE — Telephone Encounter (Signed)
14 day ZIO XT monitor placed on the patient at the hospital today. ID#- [id number]  Will forward to scheduling to please call and arrange for a 1 month follow up with Dr. Garen Lah- post hospital/ CVA.

## 2019-05-14 NOTE — Telephone Encounter (Signed)
-----   Message from Arvil Chaco, PA-C sent at 05/14/2019 11:51 AM EST ----- Regarding: FW: Zio and TEE Dr. Saunders Revel just messaged me to cancel the TEE (if scheduled).  New summary: (1) 2 week Zio XT before discharge (2) follow-up in office in 1 month with Dr. Mylo Red (3) cancel TEE  Thank you! Sorry for the last minute update! ----- Message ----- From: Arvil Chaco, PA-C Sent: 05/14/2019  11:11 AM EST To: [facility] Triage Subject: Zio and TEE                                    Hello,  This patient was admitted and seen at [facility] for CVA. Room 154.  We are expecting discharge today; however, she first needs a 2 week Zio XT monitor.   Could you also please arrange / schedule for outpatient transesophageal echocardiogram, as well as a follow-up appointment with Dr. Mylo Red in about a month to review the results?   Thank you!  Signed, Arvil Chaco, PA-C 05/14/2019, 11:12 AM Pager [phone]

## 2019-05-17 LAB — CULTURE, BLOOD (ROUTINE X 2)
Culture: NO GROWTH
Culture: NO GROWTH

## 2019-05-21 ENCOUNTER — Other Ambulatory Visit: Payer: Self-pay

## 2019-05-21 ENCOUNTER — Ambulatory Visit: Payer: Medicare HMO | Attending: Internal Medicine

## 2019-05-21 VITALS — BP 150/86 | HR 93

## 2019-05-21 DIAGNOSIS — M542 Cervicalgia: Secondary | ICD-10-CM | POA: Insufficient documentation

## 2019-05-21 DIAGNOSIS — M6281 Muscle weakness (generalized): Secondary | ICD-10-CM | POA: Insufficient documentation

## 2019-05-21 DIAGNOSIS — R2681 Unsteadiness on feet: Secondary | ICD-10-CM | POA: Diagnosis present

## 2019-05-21 NOTE — Therapy (Signed)
[place] MAIN [facility] SERVICES [address] [place], [place], [address] Phone: [phone]   Fax:  [phone]  Physical Therapy Evaluation  Patient Details  Name: Jenna [name] MRN: [id number] Date of Birth: 10-14-1940 Referring Provider (PT): Lamonte Sakai   Encounter Date: 05/21/2019  PT End of Session - 05/21/19 1622    Visit Number  1    Number of Visits  25    Date for PT Re-Evaluation  08/13/19    Authorization Type  eval: 05/21/19    PT Start Time  1530    PT Stop Time  1630    PT Time Calculation (min)  60 min    Equipment Utilized During Treatment  Gait belt    Activity Tolerance  Patient tolerated treatment well    Behavior During Therapy  [facility] for tasks assessed/performed       Past Medical History:  Diagnosis Date  . Gout   . Hyperlipidemia   . Hypertension   . Thyroid disease     Past Surgical History:  Procedure Laterality Date  . BREAST BIOPSY Right 1978   neg  . CHOLECYSTECTOMY      Vitals:   05/21/19 1536  BP: (!) 150/86  Pulse: 93  SpO2: 97%     Subjective Assessment - 05/21/19 1611    Subjective  CVA    Pertinent History  Pt started with R sided visual issues, slurred speech, and L sided headache on 05/12/19. Her family brought her over to the hospital and she was given tPA by neurology. She was diagnosed with acute cardioembolic strokes with symptoms of left-sided headache, speech and word finding abnormalities, and right-sided visual issues. Patient was admitted as a code stroke by the critical care team. MRI showed multiple acute infarctions consistent with embolic disease. The most extensive areas the left temporoparietal junction.  Patient also had a punctate acute infarction in the right parietal lobe.  No hemorrhage.  Of note also an 11 mm meningioma was seen.  No arrhythmias on telemetry, carotid ultrasound negative, echocardiogram bubble study negative.  Case discussed with neurology and cardiology.   Patient was discharged home on aspirin and Crestor.  She reports that she had her follow-up appointment with her PCP. She did not have have [place] [facility] but was referred directly for OP PT.  Pt reports that her headache and word finding issues have resolved. Her vision is improving but she still feels like both eyes are a little more blurry. She is currently wearing a 14 day heart monitor.    Diagnostic tests  MRI: see history    Patient Stated Goals  Decrease neck pain and improve leg strength (especially LLE)    Currently in Pain?  Yes    Pain Score  9    Worst: 9/10, Best 0/10 (when not moving)   Pain Location  Neck    Pain Orientation  Left    Pain Descriptors / Indicators  Other (Comment)   Feels like it has a knot in it   Pain Type  Acute pain   Started while in the hospital   Pain Onset  1 to 4 weeks ago    Pain Frequency  Constant    Aggravating Factors   Turning head to the left    Pain Relieving Factors  Not moving her head    Multiple Pain Sites  No         SUBJECTIVE Chief complaint: Pt started with R sided visual  issues, slurred speech, and L sided headache on 05/12/19. Her family brought her over to the hospital and she was given tPA by neurology. She was diagnosed with acute cardioembolic strokes with symptoms of left-sided headache, speech and word finding abnormalities, and right-sided visual issues. Patient was admitted as a code stroke by the critical care team. MRI showed multiple acute infarctions consistent with embolic disease. The most extensive areas the left temporoparietal junction.  Patient also had a punctate acute infarction in the right parietal lobe.  No hemorrhage.  Of note also an 11 mm meningioma was seen.  No arrhythmias on telemetry, carotid ultrasound negative, echocardiogram bubble study negative.  Case discussed with neurology and cardiology.  Patient was discharged home on aspirin and Crestor.  She reports that she had her follow-up appointment with her PCP. She  did not have have [place] [facility] but was referred directly for OP PT.  Pt reports that her headache and word finding issues have resolved. Her vision is improving but she still feels like both eyes are a little more blurry. She is currently wearing a 14 day heart monitor.   Red flags (bowel/bladder changes, saddle paresthesia, personal history of cancer, chills/fever, night sweats, unrelenting pain) Negative  OBJECTIVE  MUSCULOSKELETAL: Tremor: Absent Bulk: Normal Tone: Normal, no clonus  Posture Mild forward head posture with rounded upper back  Gait No gross abnormalities in gait noted  Strength R/L 4-/4- Hip flexion 4/4 Hip external rotation 4/4 Hip internal rotation 4/-4- Hip abduction 2+/2+ Hip adduction 3+/3+ Hip extension 5/5 Knee extension 4/4 Knee flexion >10 reps/>10 reps Ankle Plantarflexion 5/5 Ankle Dorsiflexion 5/5 Shoulder flexion 5/5 Shoulder abduction 5/5 Elbow flexion 5/5 Elbow extension 5/5 Wrist flexion 5/5 Wrist extension   NEUROLOGICAL:  Mental Status Patient is oriented to person, place and time.  Recent memory is intact.  Remote memory is intact.  Attention span and concentration are intact.  Expressive speech is intact.  Patient's fund of knowledge is within normal limits for educational level.  Cranial Nerves Peripheral vision appears intact, R sided visual acuity appears slightly diminished;  Extraocular muscles are intact however smooth pursuits are saccadic with L horizontal beating nystagmus with L gaze noted; Facial sensation is intact bilaterally  Facial strength is intact bilaterally  Hearing is normal as tested by gross conversation Shoulder shrug strength is intact  Tongue protrudes midline   Sensation Grossly intact to light touch bilateral UEs/LEs as determined by testing dermatomes C2-T2/L2-S2 respectively Proprioception and hot/cold testing deferred on this date  Reflexes Reflex testing  deferred  Coordination/Cerebellar Finger to Nose: WNL Heel to Shin: WNL Rapid alternating movements: WNL Finger Opposition: WNL Pronator Drift: Negative  FUNCTIONAL OUTCOME MEASURES   Results Comments  BERG 49/56 Fall risk, in need of intervention  NDI 26% Mild disability  TUG 8.9 seconds WNL  5TSTS 11.9 seconds WNL  10 Meter Gait Speed Self-selected: 8.6s = 1.16 m/s; Fastest: 7.9s = 1.27 m/s WNL  ABC Scale 53.1% Low balance confidence    POSTURAL CONTROL TESTS   Modified Clinical Test of Sensory Interaction for Balance    (CTSIB):  CONDITION TIME STRATEGY SWAY  Eyes open, firm surface 30 seconds ankle 1+  Eyes closed, firm surface 30 seconds ankle 2+  Eyes open, foam surface 30 seconds ankle 2+  Eyes closed, foam surface 20 seconds ankle 4+    OCULOMOTOR / VESTIBULAR TESTING: Deferred   BPPV TESTS: Deferred      [facility] PT Assessment - 05/21/19 1553  Assessment   Medical Diagnosis  DVA    Referring Provider (PT)  Humphrey Rolls, Neelam    Hand Dominance  Right    Next MD Visit  Not reported    Prior Therapy  During admission but not since      Precautions   Precautions  Fall      Restrictions   Weight Bearing Restrictions  No      Balance Screen   Has the patient fallen in the past 6 months  Yes    How many times?  1    Has the patient had a decrease in activity level because of a fear of falling?   No    Is the patient reluctant to leave their home because of a fear of falling?   No      Home Film/video editor residence    Living Arrangements  Spouse/significant other    Available Help at Discharge  Family    Type of [place] to enter    Entrance Stairs-Number of Steps  3    Entrance Stairs-Rails  Right    [place]  Two level;Able to live on main level with bedroom/bathroom    [place] - single point;Shower seat      Prior Function   Level of Independence  Independent    Vocation  Part  time employment    Advertising account planner, does taxes    Leisure  Walking, time with grandkids, swimming      Cognition   Overall Cognitive Status  Within Functional Limits for tasks assessed      Standardized Balance Assessment   Standardized Balance Assessment  Chief Technology Officer Test   Sit to Stand  Able to stand without using hands and stabilize independently    Standing Unsupported  Able to stand safely 2 minutes    Sitting with Back Unsupported but Feet Supported on Floor or Stool  Able to sit safely and securely 2 minutes    Stand to Sit  Sits safely with minimal use of hands    Transfers  Able to transfer safely, minor use of hands    Standing Unsupported with Eyes Closed  Able to stand 10 seconds safely    Standing Unsupported with Feet Together  Able to place feet together independently and stand 1 minute safely    From Standing, Reach Forward with Outstretched Arm  Can reach forward >12 cm safely (5")    From Standing Position, Pick up Object from Floor  Able to pick up shoe safely and easily    From Standing Position, Turn to Look Behind Over each Shoulder  Looks behind from both sides and weight shifts well    Turn 360 Degrees  Able to turn 360 degrees safely in 4 seconds or less    Standing Unsupported, Alternately Place Feet on Step/Stool  Able to complete 4 steps without aid or supervision    Standing Unsupported, One Foot in Front  Able to plae foot ahead of the other independently and hold 30 seconds    Standing on One Leg  Tries to lift leg/unable to hold 3 seconds but remains standing independently    Total Score  49    Berg comment:  LLE single leg balance <3                Objective measurements  completed on examination: See above findings.              PT Education - 05/21/19 1622    Education Details  Plan of care and neck stretches    Person(s) Educated  Patient    Methods  Explanation;Handout    Comprehension   Verbalized understanding       PT Short Term Goals - 05/21/19 1646      PT SHORT TERM GOAL #1   Title  Pt will be independent with HEP in order to improve strength and balance as well as decrease neck pain in order to decrease fall risk and improve pain-free function at home.    Time  6    Period  Weeks    Status  New    Target Date  07/02/19        PT Long Term Goals - 05/21/19 1647      PT LONG TERM GOAL #1   Title  Pt will improve BERG by at least 3 points in order to demonstrate clinically significant improvement in balance.    Baseline  05/21/19: 49/56    Time  12    Period  Weeks    Status  New    Target Date  08/13/19      PT LONG TERM GOAL #2   Title  Pt will improve ABC by at least 13% in order to demonstrate clinically significant improvement in balance confidence.    Baseline  05/21/19: 53.1%    Time  12    Period  Weeks    Status  New    Target Date  08/13/19      PT LONG TERM GOAL #3   Title  Pt will decrease worst neck pain as reported on NPRS by at least 2 points in order to demonstrate clinically significant reduction in pain.    Baseline  05/21/19: Worst: 9/10    Time  12    Period  Weeks    Status  New    Target Date  08/13/19      PT LONG TERM GOAL #4   Title  Pt will demonstrate decrease in NDI by at least 19% in order to demonstrate clinically significant reduction in disability related to neck injury/pain.    Baseline  05/21/19: 26%    Time  12    Period  Weeks    Status  New    Target Date  08/13/19             Plan - 05/21/19 1642    Clinical Impression Statement  Pt is a pleasant 79 year-old female referred to PT due to recent CVA. PT examination reveals higher level balance deficits with BERG score of 49/56. Pt states that her balance has not changed since before her CVA and it is quite likely that these deficits were present prior to her CVA. She reports low confidence in her balance with ABC of 53.1%. Her TUG, 5TSTS, and 83m gait  speed are all WNL. No focal weakness or sensory deficits identified however she does have some bilateral hip weakness. She reports more pain and weakness in her L hip s/p fracture multiple years ago. She is acutely complaining of L sided neck/upper trap pain that started during her hospital admission. She believes that it is related to sleeping in the hospital bed and she would like to address this as well. With L rotation she reports 9/10 pain located in her L upper trap which  is also tender to palpation. Pt provided some gentle neck stretches during initial evaluation and further assessment of neck will be performed at neck follow-up visit. Her NDI is 26% indicating mild disability related to her neck pain. Pt presents with deficits in strength, balance, and neck pain and will benefit from skilled PT services to address deficits to decrease risk for future falls and improve pain-free function at home.    Personal Factors and Comorbidities  Age    Examination-Activity Limitations  Squat;Locomotion Level    Examination-Participation Restrictions  Community Activity;Driving    Stability/Clinical Decision Making  Evolving/Moderate complexity    Clinical Decision Making  Moderate    Rehab Potential  Excellent    PT Frequency  2x / week    PT Duration  12 weeks    PT Treatment/Interventions  ADLs/Self Care Home Management;Aquatic Therapy;Biofeedback;Canalith Repostioning;Cryotherapy;Electrical Stimulation;Iontophoresis 4mg /ml Dexamethasone;Moist Heat;Traction;Ultrasound;DME Instruction;Gait training;Stair training;Functional mobility training;Therapeutic activities;Therapeutic exercise;Balance training;Neuromuscular re-education;Patient/family education;Manual techniques;Passive range of motion;Dry needling;Vestibular;Joint Manipulations;Spinal Manipulations    PT Next Visit Plan  Assess neck pain, Progress balance and LE strengthening especially LLE, initiate HEP for LE strength and balance    PT Home  Exercise Plan  Medbridge Access Code: [email or web address]    Consulted and Agree with Plan of Care  Patient       Patient will benefit from skilled therapeutic intervention in order to improve the following deficits and impairments:  Decreased balance, Decreased strength, Pain  Visit Diagnosis: Unsteadiness on feet  Muscle weakness (generalized)  Cervicalgia     Problem List Patient Active Problem List   Diagnosis Date Noted  . Essential hypertension   . Hyperlipidemia   . Hypothyroidism   . Stage 3a chronic kidney disease   . CVA (cerebral vascular accident) ([place]) 05/12/2019  . Acute cardioembolic stroke ([place]) 21/30/8657   Phillips Grout PT, DPT, GCS  [place] 05/22/2019, 5:20 PM  [place] MAIN [facility] SERVICES [address] [place], [place], [address] Phone: [phone]   Fax:  [phone]  Name: STEFFANIE MINGLE MRN: [id number] Date of Birth: 10-29-1940

## 2019-05-21 NOTE — Patient Instructions (Addendum)
Access Code: [email or web address] URL: [email or web address] Date: 05/21/2019 Prepared by: Roxana Hires  Exercises Seated Upper Trapezius Stretch - 3 reps - 30s hold - 3x daily - 7x weekly Seated Levator Scapulae Stretch - 3 reps - 30s hold - 3x daily - 7x weekly Seated Cervical Sidebending Stretch - 3 reps - 30s hold - 3x daily - 7x weekly

## 2019-05-28 ENCOUNTER — Ambulatory Visit: Payer: Medicare HMO

## 2019-05-28 ENCOUNTER — Other Ambulatory Visit: Payer: Self-pay

## 2019-05-28 DIAGNOSIS — M542 Cervicalgia: Secondary | ICD-10-CM

## 2019-05-28 DIAGNOSIS — R2681 Unsteadiness on feet: Secondary | ICD-10-CM | POA: Diagnosis not present

## 2019-05-28 DIAGNOSIS — M6281 Muscle weakness (generalized): Secondary | ICD-10-CM

## 2019-05-28 NOTE — Therapy (Signed)
[place] MAIN [facility] SERVICES [address] [place], [place], [address] Phone: [phone]   Fax:  [phone]  Physical Therapy Treatment  Patient Details  Name: Jenna [name] MRN: [id number] Date of Birth: [birth year] Referring Provider (PT): Lamonte Sakai   Encounter Date: 05/28/2019  PT End of Session - 05/28/19 1534    Visit Number  2    Number of Visits  25    Date for PT Re-Evaluation  08/13/19    Authorization Type  eval: 05/21/19    PT Start Time  1545    PT Stop Time  1630    PT Time Calculation (min)  45 min    Equipment Utilized During Treatment  Gait belt    Activity Tolerance  Patient tolerated treatment well    Behavior During Therapy  [facility] for tasks assessed/performed       Past Medical History:  Diagnosis Date  . Gout   . Hyperlipidemia   . Hypertension   . Thyroid disease     Past Surgical History:  Procedure Laterality Date  . BREAST BIOPSY Right 1978   neg  . CHOLECYSTECTOMY      There were no vitals filed for this visit.  Subjective Assessment - 05/28/19 1535    Subjective  Pt reports that she is doing well today. She is continuing to have L sided neck pain when she rotates her head ot the left but no resting pain upon arrival. Overall it has improved significantly and her worst neck pain over the course of the last week has only been a 5/10.    Pertinent History  Pt started with R sided visual issues, slurred speech, and L sided headache on 05/12/19. Her family brought her over to the hospital and she was given tPA by neurology. She was diagnosed with acute cardioembolic strokes with symptoms of left-sided headache, speech and word finding abnormalities, and right-sided visual issues. Patient was admitted as a code stroke by the critical care team. MRI showed multiple acute infarctions consistent with embolic disease. The most extensive areas the left temporoparietal junction.  Patient also had a punctate acute  infarction in the right parietal lobe.  No hemorrhage.  Of note also an 11 mm meningioma was seen.  No arrhythmias on telemetry, carotid ultrasound negative, echocardiogram bubble study negative.  Case discussed with neurology and cardiology.  Patient was discharged home on aspirin and Crestor.  She reports that she had her follow-up appointment with her PCP. She did not have have [place] [facility] but was referred directly for OP PT.  Pt reports that her headache and word finding issues have resolved. Her vision is improving but she still feels like both eyes are a little more blurry. She is currently wearing a 14 day heart monitor.    Diagnostic tests  MRI: see history    Patient Stated Goals  Decrease neck pain and improve leg strength (especially LLE)    Currently in Pain?  Yes    Pain Score  0-No pain   No resting pain but pain increases when turning the head to the left   Pain Location  Neck    Pain Orientation  Left    Pain Descriptors / Indicators  Sharp    Pain Type  Acute pain    Pain Onset  1 to 4 weeks ago             TREATMENT   Manual Therapy  Pain: 0/10 Present, 0/10 Best,  5/10 Worst: Aggravating factors: Turning head to the left, pulling but no pain with R rotation, sleeping on R side Easing factors: movement, stretches 24 hour pain behavior: no pattern Recent neck trauma: No Prior history of neck injury or pain: Yes, occasional neck pain previously but no surgery or trauma Pain quality: pain quality: sharp Radiating pain: No  Numbness/Tingling: No Dominant hand: right Imaging: No   Posture Mild forward head posture and rounded shoulders   Palpation Pain with palpation along L upper trap laterally and L levator scapulae. Palpable trigger points in L upper trap. No pain along rhomboids/mid trap. No pain in suboccipitals or cervical paraspinals.   Strength R/L 5/5 Shoulder flexion (anterior deltoid/pec major/coracobrachialis, axillary n. (C5/6) and musculocutaneous n.  (C5-7)) 5/5 Shoulder abduction (deltoid/supraspinatus, axillary/suprascapular n, C5) 5/5 Elbow flexion (biceps brachii, brachialis, brachioradialis, musculoskeletal n, C5/6) 5/5 Elbow extension (triceps, radial n, C7) Cervical isometrics are strong in all directions;  AROM R/L 55* (50) Cervical Flexion, "pulling" reported, pain with overpressure 33* (80) Cervical Extension, "pulling" reported during active motion, pain with overpressure 35*/35 (45/45) Cervical Lateral Flexion, "pulling" reported with R lateral flexion 65*/75 (85/85 Cervical Rotation *Indicates pain, overpressure performed unless otherwise indicated  PROM R/L AROM=PROM No change in symptoms passive vs active   Repeated Movements Deferred   Passive Accessory Intervertebral Motion (PAIVM) Pt denies reproduction of her neck pain with CPA C2-T7 and UPA bilaterally C2-T7. Mobility feels grossly WNL and she does have some sensitivity to pressure but again no reproduction of her pain.  Reflex Testing Deferred   SPECIAL TESTS Spurlings A (ipsilateral lateral flexion/axial compression): R: Negative L: Positive Spurlings B (ipsilateral lateral flexion/contralateral rotation/axial compression): R: Negative L: Positive Distraction Test: Positive  Hoffman Sign (cervical cord compression): R: Positive L: Positive ULTT Median: R: Negative L: Negative ULTT Ulnar: R: Negative L: Positive ULTT Radial: R: Negative L: Negative Shoulder abduction Test: R: Not performed L: Negative  STM with trigger point release to L upper trap x 5 minutes; L upper trap stretch x 45s; L levator stretch x 45s;   Trigger Point Dry Needling (TDN) Education performed with patient regarding potential benefit of TDN. Reviewed precautions and risks with patient. Reviewed special precautions/risks over lung fields which include pneumothorax. Reviewed signs and symptoms of pneumothorax and advised pt to go to ER immediately if these symptoms develop  advise them of dry needling treatment. Extensive time spent with pt to ensure full understanding of TDN risks. Pt provided verbal consent to treatment. TDN performed to L upper trap with 1, 0.30 x 50 single needle placement with local twitch response (LTR) and jump response. Pistoning technique utilized. L upper trap pain is almost completely resolved with L neck rotation following TDN.    Performed neck assessment with patient today. She is tender to palpation along L upper trap. No radicular or referred pain reported. Pain reported with L cervical rotation and "pulling" sensation with neck flexion, extension, and R lateral flexion. Patient's pain appears to be related to L upper trap pain. Utilized STM with trigger point release and trigger point dry needling to L upper trap and at the end of session she is mostly pain-free with all cervical movements. Pt encouraged to continue with heat to L upper trap as well as stretching. Will initiate lower extremity strength and balance program at next visit.         PT Short Term Goals - 05/21/19 1646      PT SHORT TERM GOAL #1  Title  Pt will be independent with HEP in order to improve strength and balance as well as decrease neck pain in order to decrease fall risk and improve pain-free function at home.    Time  6    Period  Weeks    Status  New    Target Date  07/02/19        PT Long Term Goals - 05/21/19 1647      PT LONG TERM GOAL #1   Title  Pt will improve BERG by at least 3 points in order to demonstrate clinically significant improvement in balance.    Baseline  05/21/19: 49/56    Time  12    Period  Weeks    Status  New    Target Date  08/13/19      PT LONG TERM GOAL #2   Title  Pt will improve ABC by at least 13% in order to demonstrate clinically significant improvement in balance confidence.    Baseline  05/21/19: 53.1%    Time  12    Period  Weeks    Status  New    Target Date  08/13/19      PT LONG TERM GOAL #3   Title   Pt will decrease worst neck pain as reported on NPRS by at least 2 points in order to demonstrate clinically significant reduction in pain.    Baseline  05/21/19: Worst: 9/10    Time  12    Period  Weeks    Status  New    Target Date  08/13/19      PT LONG TERM GOAL #4   Title  Pt will demonstrate decrease in NDI by at least 19% in order to demonstrate clinically significant reduction in disability related to neck injury/pain.    Baseline  05/21/19: 26%    Time  12    Period  Weeks    Status  New    Target Date  08/13/19            Plan - 05/28/19 1534    Clinical Impression Statement  Performed neck assessment with patient today. She is tender to palpation along L upper trap. No radicular or referred pain reported. Pain reported with L cervical rotation and "pulling" sensation with neck flexion, extension, and R lateral flexion. Patient's pain appears to be related to L upper trap pain. Utilized STM with trigger point release and trigger point dry needling to L upper trap and at the end of session she is mostly pain-free with all cervical movements. Pt encouraged to continue with heat to L upper trap as well as stretching. Will initiate lower extremity strength and balance program at next visit.    Personal Factors and Comorbidities  Age    Examination-Activity Limitations  Squat;Locomotion Level    Examination-Participation Restrictions  Community Activity;Driving    Stability/Clinical Decision Making  Evolving/Moderate complexity    Rehab Potential  Excellent    PT Frequency  2x / week    PT Duration  12 weeks    PT Treatment/Interventions  ADLs/Self Care Home Management;Aquatic Therapy;Biofeedback;Canalith Repostioning;Cryotherapy;Electrical Stimulation;Iontophoresis 4mg /ml Dexamethasone;Moist Heat;Traction;Ultrasound;DME Instruction;Gait training;Stair training;Functional mobility training;Therapeutic activities;Therapeutic exercise;Balance training;Neuromuscular  re-education;Patient/family education;Manual techniques;Passive range of motion;Dry needling;Vestibular;Joint Manipulations;Spinal Manipulations    PT Next Visit Plan  Interventions for neck pain, Progress balance and LE strengthening especially LLE, review HEP    PT Home Exercise Plan  Medbridge Access Code: [email or web address]    Consulted and Agree with Plan of  Care  Patient       Patient will benefit from skilled therapeutic intervention in order to improve the following deficits and impairments:  Decreased balance, Decreased strength, Pain  Visit Diagnosis: Unsteadiness on feet  Muscle weakness (generalized)  Cervicalgia     Problem List Patient Active Problem List   Diagnosis Date Noted  . Essential hypertension   . Hyperlipidemia   . Hypothyroidism   . Stage 3a chronic kidney disease   . CVA (cerebral vascular accident) ([place]) 05/12/2019  . Acute cardioembolic stroke ([place]) 03/23/7587   Phillips Grout PT, DPT, GCS  Macgregor Aeschliman 05/29/2019, 1:24 PM  [place] MAIN [facility] SERVICES [address] [place], [place], [address] Phone: [phone]   Fax:  [phone]  Name: Jenna [name] MRN: [id number] Date of Birth: 02-Feb-1941

## 2019-06-04 ENCOUNTER — Ambulatory Visit: Payer: Medicare HMO | Attending: Internal Medicine

## 2019-06-04 ENCOUNTER — Other Ambulatory Visit: Payer: Self-pay

## 2019-06-04 VITALS — BP 138/76 | HR 90

## 2019-06-04 DIAGNOSIS — M6281 Muscle weakness (generalized): Secondary | ICD-10-CM | POA: Diagnosis present

## 2019-06-04 DIAGNOSIS — R2681 Unsteadiness on feet: Secondary | ICD-10-CM | POA: Diagnosis present

## 2019-06-04 DIAGNOSIS — M542 Cervicalgia: Secondary | ICD-10-CM | POA: Insufficient documentation

## 2019-06-04 NOTE — Therapy (Signed)
[place] MAIN [facility] SERVICES [address] [place], [place], [address] Phone: [phone]   Fax:  [phone]  Physical Therapy Treatment  Patient Details  Name: Jenna [name] MRN: [id number] Date of Birth: [birth year] Referring Provider (PT): Lamonte Sakai   Encounter Date: 06/04/2019  PT End of Session - 06/04/19 1520    Visit Number  3    Number of Visits  25    Date for PT Re-Evaluation  08/13/19    Authorization Type  eval: 05/21/19    PT Start Time  1515    PT Stop Time  1600    PT Time Calculation (min)  45 min    Equipment Utilized During Treatment  Gait belt    Activity Tolerance  Patient tolerated treatment well    Behavior During Therapy  [facility] for tasks assessed/performed       Past Medical History:  Diagnosis Date  . Gout   . Hyperlipidemia   . Hypertension   . Thyroid disease     Past Surgical History:  Procedure Laterality Date  . BREAST BIOPSY Right 1978   neg  . CHOLECYSTECTOMY      Vitals:   06/04/19 1528  BP: 138/76  Pulse: 90  SpO2: 99%    Subjective Assessment - 06/04/19 1518    Subjective  Pt reports that she is doing well today. She denies any left sided neck pain upon arrival today. She reports no further neck pain since last session. She has continued with her stretches. She reports playing with her grandchildren this weekend and being generally sore all over.    Pertinent History  Pt started with R sided visual issues, slurred speech, and L sided headache on 05/12/19. Her family brought her over to the hospital and she was given tPA by neurology. She was diagnosed with acute cardioembolic strokes with symptoms of left-sided headache, speech and word finding abnormalities, and right-sided visual issues. Patient was admitted as a code stroke by the critical care team. MRI showed multiple acute infarctions consistent with embolic disease. The most extensive areas the left temporoparietal junction.  Patient  also had a punctate acute infarction in the right parietal lobe.  No hemorrhage.  Of note also an 11 mm meningioma was seen.  No arrhythmias on telemetry, carotid ultrasound negative, echocardiogram bubble study negative.  Case discussed with neurology and cardiology.  Patient was discharged home on aspirin and Crestor.  She reports that she had her follow-up appointment with her PCP. She did not have have [place] [facility] but was referred directly for OP PT.  Pt reports that her headache and word finding issues have resolved. Her vision is improving but she still feels like both eyes are a little more blurry. She is currently wearing a 14 day heart monitor.    Diagnostic tests  MRI: see history    Patient Stated Goals  Decrease neck pain and improve leg strength (especially LLE)    Currently in Pain?  No/denies              TREATMENT   Ther-ex  Reassessed neck and pt has pain-free symmetrical range of motion with the exception of some minor pain with extension  NuStep L2 x 4 minutes for warm-up during history (2 minutes unbilled); Seated hip flexion marches with red tband 2 x 10 (added to HEP); Seated clams with red tband 2 x 10 (added to HEP); Sit to stand without UE support 2 x 10;  With  3# ankle weights performed bilaterally: Seated LAQ 2 x 10; Standing marches x 10; Standing HS curls x 10; Standing hip abduction x 10; Standing hip extension x 10;   Neuromuscular Re-education  Airex NBOS eye closed x 30s; Airex NBOS horizontal and vertical head turns x 30s each; Airex 6" step taps alternating LE x 10 each; Tandem balance alternating forward LE x 30s each; Tandem gait on 2"x4", 4 lengths;   Pt educated throughout session about proper posture and technique with exercises. Improved exercise technique, movement at target joints, use of target muscles after min to mod verbal, visual, tactile cues.    Pt reports no further neck pain since last week. Neck is only minimally painful  with extension but rotation is completely painless. Initiated LE strengthening and balance exercises today with patient and pt was provided with written HEP to include the same. Will continue to progress LE strengthening and balance. Pt encouraged to follow-up as scheduled. Pt will benefit from PT services to address deficits in strength, balance, and mobility in order to return to full function at home.                       PT Short Term Goals - 05/21/19 1646      PT SHORT TERM GOAL #1   Title  Pt will be independent with HEP in order to improve strength and balance as well as decrease neck pain in order to decrease fall risk and improve pain-free function at home.    Time  6    Period  Weeks    Status  New    Target Date  07/02/19        PT Long Term Goals - 05/21/19 1647      PT LONG TERM GOAL #1   Title  Pt will improve BERG by at least 3 points in order to demonstrate clinically significant improvement in balance.    Baseline  05/21/19: 49/56    Time  12    Period  Weeks    Status  New    Target Date  08/13/19      PT LONG TERM GOAL #2   Title  Pt will improve ABC by at least 13% in order to demonstrate clinically significant improvement in balance confidence.    Baseline  05/21/19: 53.1%    Time  12    Period  Weeks    Status  New    Target Date  08/13/19      PT LONG TERM GOAL #3   Title  Pt will decrease worst neck pain as reported on NPRS by at least 2 points in order to demonstrate clinically significant reduction in pain.    Baseline  05/21/19: Worst: 9/10    Time  12    Period  Weeks    Status  New    Target Date  08/13/19      PT LONG TERM GOAL #4   Title  Pt will demonstrate decrease in NDI by at least 19% in order to demonstrate clinically significant reduction in disability related to neck injury/pain.    Baseline  05/21/19: 26%    Time  12    Period  Weeks    Status  New    Target Date  08/13/19            Plan - 06/04/19 1521     Clinical Impression Statement  Pt reports no further neck pain since last week.  Neck is only minimally painful with extension but rotation is completely painless. Initiated LE strengthening and balance exercises today with patient and pt was provided with written HEP to include the same. Will continue to progress LE strengthening and balance. Pt encouraged to follow-up as scheduled. Pt will benefit from PT services to address deficits in strength, balance, and mobility in order to return to full function at home.    Personal Factors and Comorbidities  Age    Examination-Activity Limitations  Squat;Locomotion Level    Examination-Participation Restrictions  Community Activity;Driving    Stability/Clinical Decision Making  Evolving/Moderate complexity    Rehab Potential  Excellent    PT Frequency  2x / week    PT Duration  12 weeks    PT Treatment/Interventions  ADLs/Self Care Home Management;Aquatic Therapy;Biofeedback;Canalith Repostioning;Cryotherapy;Electrical Stimulation;Iontophoresis 4mg /ml Dexamethasone;Moist Heat;Traction;Ultrasound;DME Instruction;Gait training;Stair training;Functional mobility training;Therapeutic activities;Therapeutic exercise;Balance training;Neuromuscular re-education;Patient/family education;Manual techniques;Passive range of motion;Dry needling;Vestibular;Joint Manipulations;Spinal Manipulations    PT Next Visit Plan  Interventions for neck pain, Progress balance and LE strengthening especially LLE, review HEP    PT Home Exercise Plan  Medbridge Access Code: [email or web address]    Consulted and Agree with Plan of Care  Patient       Patient will benefit from skilled therapeutic intervention in order to improve the following deficits and impairments:  Decreased balance, Decreased strength, Pain  Visit Diagnosis: Unsteadiness on feet  Cervicalgia     Problem List Patient Active Problem List   Diagnosis Date Noted  . Essential hypertension   . Hyperlipidemia   .  Hypothyroidism   . Stage 3a chronic kidney disease   . CVA (cerebral vascular accident) ([place]) 05/12/2019  . Acute cardioembolic stroke ([place]) 31/28/1188   Phillips Grout PT, DPT, GCS  Tonie Vizcarrondo 06/04/2019, 4:29 PM  [place] MAIN [facility] SERVICES [address] [place], [place], [address] Phone: [phone]   Fax:  [phone]  Name: Jenna [name] MRN: [id number] Date of Birth: 1940-12-20

## 2019-06-07 ENCOUNTER — Ambulatory Visit: Payer: Medicare HMO

## 2019-06-07 ENCOUNTER — Other Ambulatory Visit: Payer: Self-pay

## 2019-06-07 DIAGNOSIS — R2681 Unsteadiness on feet: Secondary | ICD-10-CM | POA: Diagnosis not present

## 2019-06-07 DIAGNOSIS — M542 Cervicalgia: Secondary | ICD-10-CM

## 2019-06-07 DIAGNOSIS — M6281 Muscle weakness (generalized): Secondary | ICD-10-CM

## 2019-06-07 NOTE — Therapy (Signed)
[place] MAIN [facility] SERVICES [address] [place], [place], [address] Phone: [phone]   Fax:  [phone]  Physical Therapy Treatment  Patient Details  Name: Jenna [name] MRN: [id number] Date of Birth: February 18, 1941 Referring Provider (PT): Lamonte Sakai   Encounter Date: 06/07/2019  PT End of Session - 06/07/19 0920    Visit Number  4    Number of Visits  25    Date for PT Re-Evaluation  08/13/19    Authorization Type  eval: 05/21/19    PT Start Time  0915    PT Stop Time  0959    PT Time Calculation (min)  44 min    Equipment Utilized During Treatment  Gait belt    Activity Tolerance  Patient tolerated treatment well    Behavior During Therapy  [facility] for tasks assessed/performed       Past Medical History:  Diagnosis Date  . Gout   . Hyperlipidemia   . Hypertension   . Thyroid disease     Past Surgical History:  Procedure Laterality Date  . BREAST BIOPSY Right 1978   neg  . CHOLECYSTECTOMY      There were no vitals filed for this visit.  Subjective Assessment - 06/07/19 0919    Subjective  Patient reports her hips have been very sore since last tuesday. No stumbles or LOB since last session.    Pertinent History  Pt started with R sided visual issues, slurred speech, and L sided headache on 05/12/19. Her family brought her over to the hospital and she was given tPA by neurology. She was diagnosed with acute cardioembolic strokes with symptoms of left-sided headache, speech and word finding abnormalities, and right-sided visual issues. Patient was admitted as a code stroke by the critical care team. MRI showed multiple acute infarctions consistent with embolic disease. The most extensive areas the left temporoparietal junction.  Patient also had a punctate acute infarction in the right parietal lobe.  No hemorrhage.  Of note also an 11 mm meningioma was seen.  No arrhythmias on telemetry, carotid ultrasound negative, echocardiogram  bubble study negative.  Case discussed with neurology and cardiology.  Patient was discharged home on aspirin and Crestor.  She reports that she had her follow-up appointment with her PCP. She did not have have [facility] [facility] but was referred directly for OP PT.  Pt reports that her headache and word finding issues have resolved. Her vision is improving but she still feels like both eyes are a little more blurry. She is currently wearing a 14 day heart monitor.    Diagnostic tests  MRI: see history    Patient Stated Goals  Decrease neck pain and improve leg strength (especially LLE)    Currently in Pain?  No/denies       NuStep L2 x 4 minutes for cardiovascular challenge maintaining >60 RPM for capacity for mobility  TherEx Sit to stand without UE support 2 x 10;   Matrix resisted walking 7.5 ; 3x each direction laterally L and R  RTB around ankles: alternating LAQ 15x each LE  RTB around ankles; ER/IR 15x each LE      Neuromuscular Re-education  Airex: one foot on airex pad on foot 6" step  30 second holds x 2 trials each LE,  airex pad: 6" step toe taps 12x each LE occasional LOB to L Airex pad: horizontal and vertical head turns 30 seconds holds x 2 trials each direction Airex eye closed  x 30s; Red mat: ambulate across with cueing for widening BOS 8x length no UE support  Orange hurdles lateral step over 12x each LE ; challenging for spatial awareness of foot positioning      Pt educated throughout session about proper posture and technique with exercises. Improved exercise technique, movement at target joints, use of target muscles after min to mod verbal, visual, tactile cues.                         PT Education - 06/07/19 0919    Education Details  exercise technique, body mechanics    Person(s) Educated  Patient    Methods  Explanation;Demonstration;Tactile cues;Verbal cues    Comprehension  Verbalized understanding;Returned demonstration;Verbal cues  required;Tactile cues required       PT Short Term Goals - 05/21/19 1646      PT SHORT TERM GOAL #1   Title  Pt will be independent with HEP in order to improve strength and balance as well as decrease neck pain in order to decrease fall risk and improve pain-free function at home.    Time  6    Period  Weeks    Status  New    Target Date  07/02/19        PT Long Term Goals - 05/21/19 1647      PT LONG TERM GOAL #1   Title  Pt will improve BERG by at least 3 points in order to demonstrate clinically significant improvement in balance.    Baseline  05/21/19: 49/56    Time  12    Period  Weeks    Status  New    Target Date  08/13/19      PT LONG TERM GOAL #2   Title  Pt will improve ABC by at least 13% in order to demonstrate clinically significant improvement in balance confidence.    Baseline  05/21/19: 53.1%    Time  12    Period  Weeks    Status  New    Target Date  08/13/19      PT LONG TERM GOAL #3   Title  Pt will decrease worst neck pain as reported on NPRS by at least 2 points in order to demonstrate clinically significant reduction in pain.    Baseline  05/21/19: Worst: 9/10    Time  12    Period  Weeks    Status  New    Target Date  08/13/19      PT LONG TERM GOAL #4   Title  Pt will demonstrate decrease in NDI by at least 19% in order to demonstrate clinically significant reduction in disability related to neck injury/pain.    Baseline  05/21/19: 26%    Time  12    Period  Weeks    Status  New    Target Date  08/13/19            Plan - 06/07/19 1145    Clinical Impression Statement  Patient is very motivated and pleasant throughout session. Unstable surfaces are challenging for patient to negotiate with occasional scissor patterning resulting in instability and occasional requirement of use of UE's for stabilization. Pt will benefit from PT services to address deficits in strength, balance, and mobility in order to return to full function at home     Personal Factors and Comorbidities  Age    Examination-Activity Limitations  Squat;Locomotion Level    Examination-Participation Restrictions  Community Activity;Driving    Stability/Clinical Decision Making  Evolving/Moderate complexity    Rehab Potential  Excellent    PT Frequency  2x / week    PT Duration  12 weeks    PT Treatment/Interventions  ADLs/Self Care Home Management;Aquatic Therapy;Biofeedback;Canalith Repostioning;Cryotherapy;Electrical Stimulation;Iontophoresis 4mg /ml Dexamethasone;Moist Heat;Traction;Ultrasound;DME Instruction;Gait training;Stair training;Functional mobility training;Therapeutic activities;Therapeutic exercise;Balance training;Neuromuscular re-education;Patient/family education;Manual techniques;Passive range of motion;Dry needling;Vestibular;Joint Manipulations;Spinal Manipulations    PT Next Visit Plan  Interventions for neck pain, Progress balance and LE strengthening especially LLE, review HEP    PT Home Exercise Plan  Medbridge Access Code: [email or web address]    Consulted and Agree with Plan of Care  Patient       Patient will benefit from skilled therapeutic intervention in order to improve the following deficits and impairments:  Decreased balance, Decreased strength, Pain  Visit Diagnosis: Unsteadiness on feet  Cervicalgia  Muscle weakness (generalized)     Problem List Patient Active Problem List   Diagnosis Date Noted  . Essential hypertension   . Hyperlipidemia   . Hypothyroidism   . Stage 3a chronic kidney disease   . CVA (cerebral vascular accident) ([place]) 05/12/2019  . Acute cardioembolic stroke ([place]) 77/82/4235   Janna Arch, PT, DPT   06/07/2019, 11:49 AM  [place] MAIN [facility] SERVICES [address] [place], [place], [address] Phone: [phone]   Fax:  [phone]  Name: BRINLEY ROSETE MRN: [id number] Date of Birth: 08/07/1940

## 2019-06-11 ENCOUNTER — Ambulatory Visit: Payer: Medicare HMO

## 2019-06-11 ENCOUNTER — Other Ambulatory Visit: Payer: Self-pay

## 2019-06-11 VITALS — BP 143/72 | HR 85

## 2019-06-11 DIAGNOSIS — R2681 Unsteadiness on feet: Secondary | ICD-10-CM | POA: Diagnosis not present

## 2019-06-11 DIAGNOSIS — M6281 Muscle weakness (generalized): Secondary | ICD-10-CM

## 2019-06-11 NOTE — Therapy (Signed)
[place] MAIN [facility] SERVICES [address] [place], [place], [address] Phone: [phone]   Fax:  [phone]  Physical Therapy Treatment  Patient Details  Name: Jenna [name] MRN: [id number] Date of Birth: 10/26/1940 Referring Provider (PT): Lamonte Sakai   Encounter Date: 06/11/2019  PT End of Session - 06/11/19 1600    Visit Number  5    Number of Visits  25    Date for PT Re-Evaluation  08/13/19    Authorization Type  eval: 05/21/19    PT Start Time  1545    PT Stop Time  1630    PT Time Calculation (min)  45 min    Equipment Utilized During Treatment  Gait belt    Activity Tolerance  Patient tolerated treatment well    Behavior During Therapy  [facility] for tasks assessed/performed       Past Medical History:  Diagnosis Date  . Gout   . Hyperlipidemia   . Hypertension   . Thyroid disease     Past Surgical History:  Procedure Laterality Date  . BREAST BIOPSY Right 1978   neg  . CHOLECYSTECTOMY      Vitals:   06/11/19 1556  BP: (!) 143/72  Pulse: 85  SpO2: 98%    Subjective Assessment - 06/11/19 1558    Subjective  Patient reports she is doing well today. Her hips have been feeling "pretty good." She denies any pain upon arrival today. No stumbles or LOB since last session.    Pertinent History  Pt started with R sided visual issues, slurred speech, and L sided headache on 05/12/19. Her family brought her over to the hospital and she was given tPA by neurology. She was diagnosed with acute cardioembolic strokes with symptoms of left-sided headache, speech and word finding abnormalities, and right-sided visual issues. Patient was admitted as a code stroke by the critical care team. MRI showed multiple acute infarctions consistent with embolic disease. The most extensive areas the left temporoparietal junction.  Patient also had a punctate acute infarction in the right parietal lobe.  No hemorrhage.  Of note also an 11 mm meningioma  was seen.  No arrhythmias on telemetry, carotid ultrasound negative, echocardiogram bubble study negative.  Case discussed with neurology and cardiology.  Patient was discharged home on aspirin and Crestor.  She reports that she had her follow-up appointment with her PCP. She did not have have [facility] but was referred directly for OP PT.  Pt reports that her headache and word finding issues have resolved. Her vision is improving but she still feels like both eyes are a little more blurry. She is currently wearing a 14 day heart monitor.    Diagnostic tests  MRI: see history    Patient Stated Goals  Decrease neck pain and improve leg strength (especially LLE)    Currently in Pain?  No/denies             TREATMENT   Neuromuscular Re-education NuStep L2 x 5 minutes for cardiovascular challenge maintaining >60 RPM for capacity for mobility Orange hurdles forward and lateral step overs x 10 each LE for both, challenging for spatial awareness of foot positioning  Airex balance beam forward tandem gait without UE support x 4 lengths; Airex balance beam side stepping without UE support x 4 lengths; Airex tandem static balance alteranting forward LE x 30s each; 1/2 bolster A/P balance x 60s; 1/2 bolster A/P heel/toe rocking x 10 each direction; 1/2 bolster  tandem balance alternating forward LE 2 x 30s each;   Pt educated throughout session about proper posture and technique with exercises. Improved exercise technique, movement at target joints, use of target muscles after min to mod verbal, visual, tactile cues.    Pt demonstrates excellent motivation during session. She denies any further neck pain at this time. She would like to decrease her frequency to once/week and therapist discussed discharging after a few more visits reinforcing balance exercises. Pt is in agreement. Pt will benefit from PT services to address deficits in strength, balance, and mobility in order to return to full  function at home.                     PT Short Term Goals - 05/21/19 1646      PT SHORT TERM GOAL #1   Title  Pt will be independent with HEP in order to improve strength and balance as well as decrease neck pain in order to decrease fall risk and improve pain-free function at home.    Time  6    Period  Weeks    Status  New    Target Date  07/02/19        PT Long Term Goals - 05/21/19 1647      PT LONG TERM GOAL #1   Title  Pt will improve BERG by at least 3 points in order to demonstrate clinically significant improvement in balance.    Baseline  05/21/19: 49/56    Time  12    Period  Weeks    Status  New    Target Date  08/13/19      PT LONG TERM GOAL #2   Title  Pt will improve ABC by at least 13% in order to demonstrate clinically significant improvement in balance confidence.    Baseline  05/21/19: 53.1%    Time  12    Period  Weeks    Status  New    Target Date  08/13/19      PT LONG TERM GOAL #3   Title  Pt will decrease worst neck pain as reported on NPRS by at least 2 points in order to demonstrate clinically significant reduction in pain.    Baseline  05/21/19: Worst: 9/10    Time  12    Period  Weeks    Status  New    Target Date  08/13/19      PT LONG TERM GOAL #4   Title  Pt will demonstrate decrease in NDI by at least 19% in order to demonstrate clinically significant reduction in disability related to neck injury/pain.    Baseline  05/21/19: 26%    Time  12    Period  Weeks    Status  New    Target Date  08/13/19            Plan - 06/11/19 1600    Clinical Impression Statement  Pt demonstrates excellent motivation during session. She denies any further neck pain at this time. She would like to decrease her frequency to once/week and therapist discussed discharging after a few more visits reinforcing balance exercises. Pt is in agreement. Pt will benefit from PT services to address deficits in strength, balance, and mobility in  order to return to full function at home.    Personal Factors and Comorbidities  Age    Examination-Activity Limitations  Squat;Locomotion Level    Examination-Participation Restrictions  Community Activity;Driving  Stability/Clinical Decision Making  Evolving/Moderate complexity    Clinical Decision Making  Moderate    Rehab Potential  Excellent    PT Frequency  2x / week    PT Duration  12 weeks    PT Treatment/Interventions  ADLs/Self Care Home Management;Aquatic Therapy;Biofeedback;Canalith Repostioning;Cryotherapy;Electrical Stimulation;Iontophoresis 4mg /ml Dexamethasone;Moist Heat;Traction;Ultrasound;DME Instruction;Gait training;Stair training;Functional mobility training;Therapeutic activities;Therapeutic exercise;Balance training;Neuromuscular re-education;Patient/family education;Manual techniques;Passive range of motion;Dry needling;Vestibular;Joint Manipulations;Spinal Manipulations    PT Next Visit Plan  Interventions for neck pain, Progress balance and LE strengthening especially LLE, review HEP    PT Home Exercise Plan  Medbridge Access Code: [email or web address]    Consulted and Agree with Plan of Care  Patient       Patient will benefit from skilled therapeutic intervention in order to improve the following deficits and impairments:  Decreased balance, Decreased strength, Pain  Visit Diagnosis: Unsteadiness on feet  Muscle weakness (generalized)     Problem List Patient Active Problem List   Diagnosis Date Noted  . Essential hypertension   . Hyperlipidemia   . Hypothyroidism   . Stage 3a chronic kidney disease   . CVA (cerebral vascular accident) ([place]) 05/12/2019  . Acute cardioembolic stroke ([place]) 53/66/4403   Phillips Grout PT, DPT, GCS  Ram Haugan 06/12/2019, 5:18 PM  [place] MAIN [facility] SERVICES [address] [place], [place], [address] Phone: [phone]   Fax:  [phone]  Name: Jenna [name] MRN:  [id number] Date of Birth: 1941-03-30

## 2019-06-13 ENCOUNTER — Telehealth: Payer: Self-pay

## 2019-06-13 NOTE — Telephone Encounter (Signed)
-----   Message from Arvil Chaco, PA-C sent at 06/13/2019  3:00 PM EST ----- Please let Jenna [name] know that her Elwyn Reach showed her predominant rhythm as normal sinus rhythm (a normal heart rhythm) with an average heart rate of 79 bpm and a range of min 55bpm to max 119 bpm.  She had rare extra beats from the top (PACs) and bottom (PVCs) part of her heart.  She had 7 very brief runs of a faster heart rate that originates from the top part of her heart, the longest of which lasted 15 beats and the fastest of which got up to 133bpm. Some of her triggered events corresponded with the rare extra beats from the top and bottom of her heart.  The remainder of her triggered events corresponded to a normal heart rhythm. There were no sustained arrhythmias (abnormal heart rhythms) or prolonged pauses between heart beats.   I will cc Dr. Garen Lah as an FYI that her Zio resulted and so he can review it with the patient at their upcoming visit.

## 2019-06-13 NOTE — Telephone Encounter (Signed)
Spoke with patient regarding results and recommendations.  Patient verbalizes understanding and is agreeable to plan of care. Advised patient to call back with any issues or concerns.

## 2019-06-13 NOTE — Telephone Encounter (Signed)
Left message on patients voicemail to please return our call.   

## 2019-06-14 ENCOUNTER — Ambulatory Visit: Payer: Medicare HMO

## 2019-06-17 ENCOUNTER — Ambulatory Visit (INDEPENDENT_AMBULATORY_CARE_PROVIDER_SITE_OTHER): Payer: Medicare HMO

## 2019-06-17 ENCOUNTER — Ambulatory Visit (INDEPENDENT_AMBULATORY_CARE_PROVIDER_SITE_OTHER): Payer: Medicare HMO | Admitting: Cardiology

## 2019-06-17 ENCOUNTER — Other Ambulatory Visit: Payer: Self-pay

## 2019-06-17 ENCOUNTER — Encounter: Payer: Self-pay | Admitting: Cardiology

## 2019-06-17 VITALS — BP 130/80 | HR 77 | Ht 64.0 in | Wt 181.4 lb

## 2019-06-17 DIAGNOSIS — I639 Cerebral infarction, unspecified: Secondary | ICD-10-CM | POA: Diagnosis not present

## 2019-06-17 DIAGNOSIS — E7800 Pure hypercholesterolemia, unspecified: Secondary | ICD-10-CM | POA: Diagnosis not present

## 2019-06-17 NOTE — Progress Notes (Signed)
Cardiology Office Note:    Date:  06/17/2019   ID:  [name], DOB 04-26-1940, MRN [id number]  PCP:  Perrin Maltese, MD  Cardiologist:  Kate Sable, MD  Electrophysiologist:  None   Referring MD: Perrin Maltese, MD   Chief Complaint  Patient presents with  . other    Follow up [facility]; CVA. Meds reviewed by the pt. verbally. Pt. c/o shortness of breath.     History of Present Illness:    Jenna [name] is a 79 y.o. female with a hx of hypertension, hyperlipidemia, thyroid disease, CVA who is being seen due to history of CVA.  On 05/12/2019, patient had difficulty speaking and slurred speech she was taken to the ED by her husband and was diagnosed with an acute stroke and given TPA.  MRI of the brain showed multiple acute infarctions involving both hemispheres consistent with embolic disease.  While in the hospital, she was evaluated by cardiology at which time she reported intermittent palpitations.  She denies history of A. Fib. Work-up with transthoracic echo with bubble study, carotid Dopplers and telemetry monitoring were unrevealing.  Patient was started on aspirin and high intensity statin for secondary prevention.  A 2-week cardiac monitor was also placed.  The patient has been doing okay since hospital discharge.  Denies any speech difficulty or blurred vision.  She has some balance issues when going down steps.  Past Medical History:  Diagnosis Date  . Gout   . Hyperlipidemia   . Hypertension   . Thyroid disease     Past Surgical History:  Procedure Laterality Date  . BREAST BIOPSY Right 1978   neg  . CHOLECYSTECTOMY      Current Medications: Current Meds  Medication Sig  . allopurinol (ZYLOPRIM) 100 MG tablet Take 100 mg by mouth 2 (two) times daily.  Marland Kitchen aspirin EC 325 MG EC tablet Take 1 tablet (325 mg total) by mouth daily.  . Calcium Carbonate-Vitamin D 600-200 MG-UNIT TABS Take 1 tablet by mouth 2 (two) times daily with a meal.  . levothyroxine  (SYNTHROID) 50 MCG tablet Take 50 mcg by mouth daily.  . Multiple Vitamin (MULTIVITAMIN) tablet Take 1 tablet by mouth daily.  . rosuvastatin (CRESTOR) 40 MG tablet Take 1 tablet (40 mg total) by mouth daily at 6 PM.     Allergies:   Nicotine   Social History   Socioeconomic History  . Marital status: Married    Spouse name: Not on file  . Number of children: Not on file  . Years of education: Not on file  . Highest education level: Not on file  Occupational History  . Not on file  Tobacco Use  . Smoking status: Current Every Day Smoker    Packs/day: 0.25    Types: Cigarettes  . Smokeless tobacco: Never Used  . Tobacco comment: smoking 4-5 cigarettes daily.   Substance and Sexual Activity  . Alcohol use: No  . Drug use: No  . Sexual activity: Not on file  Other Topics Concern  . Not on file  Social History Narrative  . Not on file   Social Determinants of Health   Financial Resource Strain:   . Difficulty of Paying Living Expenses:   Food Insecurity:   . Worried About Charity fundraiser in the Last Year:   . Arboriculturist in the Last Year:   Transportation Needs:   . Film/video editor (Medical):   Marland Kitchen Lack of Transportation (  Non-Medical):   Physical Activity:   . Days of Exercise per Week:   . Minutes of Exercise per Session:   Stress:   . Feeling of Stress :   Social Connections:   . Frequency of Communication with Friends and Family:   . Frequency of Social Gatherings with Friends and Family:   . Attends Religious Services:   . Active Member of Clubs or Organizations:   . Attends Archivist Meetings:   Marland Kitchen Marital Status:      Family History: The patient's family history includes Breast cancer in her cousin; Breast cancer (age of onset: 78) in her maternal grandmother.  ROS:   Please see the history of present illness.     All other systems reviewed and are negative.  EKGs/Labs/Other Studies Reviewed:    The following studies were  reviewed today:   EKG:  EKG is  ordered today.  The ekg ordered today demonstrates normal sinus rhythm.  Recent Labs: 05/12/2019: ALT 10; B Natriuretic Peptide 50.0 05/13/2019: Hemoglobin 11.8; Magnesium 2.2; Platelets 218 05/14/2019: BUN 19; Creatinine, Ser 1.10; Potassium 3.9; Sodium 140  Recent Lipid Panel    Component Value Date/Time   CHOL 178 05/13/2019 0449   TRIG 236 (H) 05/13/2019 0449   HDL 28 (L) 05/13/2019 0449   CHOLHDL 6.4 05/13/2019 0449   VLDL 47 (H) 05/13/2019 0449   LDLCALC 103 (H) 05/13/2019 0449    Physical Exam:    VS:  BP 130/80 (BP Location: Left Arm, Patient Position: Sitting, Cuff Size: Normal)   Pulse 77   Ht 5\' 4"  (1.626 m)   Wt 181 lb 6 oz (82.3 kg)   SpO2 99%   BMI 31.13 kg/m     Wt Readings from Last 3 Encounters:  06/17/19 181 lb 6 oz (82.3 kg)  05/14/19 186 lb 1.1 oz (84.4 kg)  12/21/17 198 lb (89.8 kg)     GEN:  Well nourished, well developed in no acute distress HEENT: Normal NECK: No JVD; No carotid bruits LYMPHATICS: No lymphadenopathy CARDIAC: RRR, no murmurs, rubs, gallops RESPIRATORY:  Clear to auscultation without rales, wheezing or rhonchi  ABDOMEN: Soft, non-tender, non-distended MUSCULOSKELETAL:  No edema; No deformity  SKIN: Warm and dry NEUROLOGIC:  Alert and oriented x 3 PSYCHIATRIC:  Normal affect   ASSESSMENT:    1. Cerebrovascular accident (CVA), unspecified mechanism ([place])   2. Pure hypercholesterolemia    PLAN:    In order of problems listed above:  1. Patient with history of CVA.  2-week cardiac monitor did not show any evidence for atrial fibrillation or atrial flutter.  Continue aspirin, statin as prescribed.  Transthoracic echocardiogram showed normal ejection fraction with EF of 55 to 60%.  Saline contrast study was negative for interatrial shunt.  We will repeat cardiac monitor for another 2 weeks.  We discussed diagnostic testing including TEE.  Patient would like to hold off for now due to invasive nature.   I think this is reasonable. 2. History of hyperlipidemia.  Continue Crestor 40 mg daily.  Follow-up after cardiac monitoring 1 month.  Medication Adjustments/Labs and Tests Ordered: Current medicines are reviewed at length with the patient today.  Concerns regarding medicines are outlined above.  Orders Placed This Encounter  Procedures  . LONG TERM MONITOR (3-14 DAYS)  . EKG 12-Lead   No orders of the defined types were placed in this encounter.   Patient Instructions  Medication Instructions:  Your physician recommends that you continue on your current medications  as directed. Please refer to the Current Medication list given to you today.  *If you need a refill on your cardiac medications before your next appointment, please call your pharmacy*  Testing/Procedures: Your physician has recommended that you wear a Zio monitor. This monitor is a medical device that records the heart's electrical activity. Doctors most often use these monitors to diagnose arrhythmias. Arrhythmias are problems with the speed or rhythm of the heartbeat. The monitor is a small device applied to your chest. You can wear one while you do your normal daily activities. While wearing this monitor if you have any symptoms to push the button and record what you felt. Once you have worn this monitor for the period of time provider prescribed (Usually 14 days), you will return the monitor device in the postage paid box. Once it is returned they will download the data collected and provide [place] with a report which the provider will then review and we will call you with those results. Important tips:  1. Avoid showering during the first 24 hours of wearing the monitor. 2. Avoid excessive sweating to help maximize wear time. 3. Do not submerge the device, no hot tubs, and no swimming pools. 4. Keep any lotions or oils away from the patch. 5. After 24 hours you may shower with the patch on. Take brief showers with your back  facing the shower head.  6. Do not remove patch once it has been placed because that will interrupt data and decrease adhesive wear time. 7. Push the button when you have any symptoms and write down what you were feeling. 8. Once you have completed wearing your monitor, remove and place into box which has postage paid and place in your outgoing mailbox.  9. If for some reason you have misplaced your box then call our office and we can provide another box and/or mail it off for you.  Follow-Up: At [facility], you and your health needs are our priority.  As part of our continuing mission to provide you with exceptional heart care, we have created designated Provider Care Teams.  These Care Teams include your primary Cardiologist (physician) and Advanced Practice Providers (APPs -  Physician Assistants and Nurse Practitioners) who all work together to provide you with the care you need, when you need it.  We recommend signing up for the patient portal called "MyChart".  Sign up information is provided on this After Visit Summary.  MyChart is used to connect with patients for Virtual Visits (Telemedicine).  Patients are able to view lab/test results, encounter notes, upcoming appointments, etc.  Non-urgent messages can be sent to your provider as well.   To learn more about what you can do with MyChart, go to [email or web address].    Your next appointment:   After ZIO.  The format for your next appointment:   In Person  Provider:   Kate Sable, MD     Signed, Kate Sable, MD  06/17/2019 2:58 PM    [place]

## 2019-06-17 NOTE — Patient Instructions (Signed)
Medication Instructions:  Your physician recommends that you continue on your current medications as directed. Please refer to the Current Medication list given to you today.  *If you need a refill on your cardiac medications before your next appointment, please call your pharmacy*  Testing/Procedures: Your physician has recommended that you wear a Zio monitor. This monitor is a medical device that records the heart's electrical activity. Doctors most often use these monitors to diagnose arrhythmias. Arrhythmias are problems with the speed or rhythm of the heartbeat. The monitor is a small device applied to your chest. You can wear one while you do your normal daily activities. While wearing this monitor if you have any symptoms to push the button and record what you felt. Once you have worn this monitor for the period of time provider prescribed (Usually 14 days), you will return the monitor device in the postage paid box. Once it is returned they will download the data collected and provide [place] with a report which the provider will then review and we will call you with those results. Important tips:  1. Avoid showering during the first 24 hours of wearing the monitor. 2. Avoid excessive sweating to help maximize wear time. 3. Do not submerge the device, no hot tubs, and no swimming pools. 4. Keep any lotions or oils away from the patch. 5. After 24 hours you may shower with the patch on. Take brief showers with your back facing the shower head.  6. Do not remove patch once it has been placed because that will interrupt data and decrease adhesive wear time. 7. Push the button when you have any symptoms and write down what you were feeling. 8. Once you have completed wearing your monitor, remove and place into box which has postage paid and place in your outgoing mailbox.  9. If for some reason you have misplaced your box then call our office and we can provide another box and/or mail it off for  you.  Follow-Up: At [facility] you and your health needs are our priority.  As part of our continuing mission to provide you with exceptional heart care, we have created designated Provider Care Teams.  These Care Teams include your primary Cardiologist (physician) and Advanced Practice Providers (APPs -  Physician Assistants and Nurse Practitioners) who all work together to provide you with the care you need, when you need it.  We recommend signing up for the patient portal called "MyChart".  Sign up information is provided on this After Visit Summary.  MyChart is used to connect with patients for Virtual Visits (Telemedicine).  Patients are able to view lab/test results, encounter notes, upcoming appointments, etc.  Non-urgent messages can be sent to your provider as well.   To learn more about what you can do with MyChart, go to [email or web address].    Your next appointment:   After ZIO.  The format for your next appointment:   In Person  Provider:   Kate Sable, MD

## 2019-06-18 ENCOUNTER — Ambulatory Visit: Payer: Medicare HMO

## 2019-06-18 VITALS — BP 157/77 | HR 91

## 2019-06-18 DIAGNOSIS — R2681 Unsteadiness on feet: Secondary | ICD-10-CM

## 2019-06-18 DIAGNOSIS — M6281 Muscle weakness (generalized): Secondary | ICD-10-CM

## 2019-06-18 NOTE — Therapy (Signed)
[place] MAIN [facility] SERVICES [address] [place], [place], [address] Phone: [phone]   Fax:  [phone]  Physical Therapy Treatment  Patient Details  Name: Jenna [name] MRN: [id number] Date of Birth: [birth year] Referring Provider (PT): Lamonte Sakai   Encounter Date: 06/18/2019  PT End of Session - 06/18/19 1523    Visit Number  6    Number of Visits  25    Date for PT Re-Evaluation  08/13/19    Authorization Type  eval: 05/21/19    PT Start Time  1520    PT Stop Time  1600    PT Time Calculation (min)  40 min    Equipment Utilized During Treatment  Gait belt    Activity Tolerance  Patient tolerated treatment well    Behavior During Therapy  [facility] for tasks assessed/performed       Past Medical History:  Diagnosis Date  . Gout   . Hyperlipidemia   . Hypertension   . Thyroid disease     Past Surgical History:  Procedure Laterality Date  . BREAST BIOPSY Right 1978   neg  . CHOLECYSTECTOMY      Vitals:   06/18/19 1524  BP: (!) 157/77  Pulse: 91  SpO2: 99%    Subjective Assessment - 06/18/19 1520    Subjective  Patient reports she is doing well today. She denies any new onset pain upon arrival today. No stumbles or LOB since last session.    Pertinent History  Pt started with R sided visual issues, slurred speech, and L sided headache on 05/12/19. Her family brought her over to the hospital and she was given tPA by neurology. She was diagnosed with acute cardioembolic strokes with symptoms of left-sided headache, speech and word finding abnormalities, and right-sided visual issues. Patient was admitted as a code stroke by the critical care team. MRI showed multiple acute infarctions consistent with embolic disease. The most extensive areas the left temporoparietal junction.  Patient also had a punctate acute infarction in the right parietal lobe.  No hemorrhage.  Of note also an 11 mm meningioma was seen.  No arrhythmias on  telemetry, carotid ultrasound negative, echocardiogram bubble study negative.  Case discussed with neurology and cardiology.  Patient was discharged home on aspirin and Crestor.  She reports that she had her follow-up appointment with her PCP. She did not have have [place] [facility] but was referred directly for OP PT.  Pt reports that her headache and word finding issues have resolved. Her vision is improving but she still feels like both eyes are a little more blurry. She is currently wearing a 14 day heart monitor.    Diagnostic tests  MRI: see history    Patient Stated Goals  Decrease neck pain and improve leg strength (especially LLE)    Currently in Pain?  No/denies            TREATMENT   Ther-ex  Seated with 4# ankle weights: Marches x 20 bilateral; LAQ x 20 bilateral;  Standing with 4# ankle weights: HS curls x 20 bilateral; Marching x 20 bilateral; Hip abduction x 20 bilateral; Hip extension x 20 bilateral;  Standing heel raises x 20; Mini squats with BUE support x 10;   Neuromuscular Re-education Airex NBOS eye open/closed x 30s each; Airex alternating 6" step taps x 10 bilateral; One foot on Airex and one foot on 6" step static balance x 30s; One foot on Airex and one foot on  6" step horizontal/vertical head turns x 30s; 1/2 foam roll A/P static balance x 30s; 1/2 foam roll A/P heel/toe weight shifting x 10 each; 1/2 foam roll tandem balance alternating forward LE x 30s each;   Pt educated throughout session about proper posture and technique with exercises. Improved exercise technique, movement at target joints, use of target muscles after min to mod verbal, visual, tactile cues.    Pt demonstrates excellent motivation during session. She denies any further neck pain at this time but does report it feels slightly tight today from working in a chair and looking at a computer screen. Continued with BLE strengthening as well as balance training today and progressed  resistance as well as challenge of activities. Pt will benefit from PT services to address deficits in strength, balance, and mobility in order to return to full function at home.                       PT Short Term Goals - 05/21/19 1646      PT SHORT TERM GOAL #1   Title  Pt will be independent with HEP in order to improve strength and balance as well as decrease neck pain in order to decrease fall risk and improve pain-free function at home.    Time  6    Period  Weeks    Status  New    Target Date  07/02/19        PT Long Term Goals - 05/21/19 1647      PT LONG TERM GOAL #1   Title  Pt will improve BERG by at least 3 points in order to demonstrate clinically significant improvement in balance.    Baseline  05/21/19: 49/56    Time  12    Period  Weeks    Status  New    Target Date  08/13/19      PT LONG TERM GOAL #2   Title  Pt will improve ABC by at least 13% in order to demonstrate clinically significant improvement in balance confidence.    Baseline  05/21/19: 53.1%    Time  12    Period  Weeks    Status  New    Target Date  08/13/19      PT LONG TERM GOAL #3   Title  Pt will decrease worst neck pain as reported on NPRS by at least 2 points in order to demonstrate clinically significant reduction in pain.    Baseline  05/21/19: Worst: 9/10    Time  12    Period  Weeks    Status  New    Target Date  08/13/19      PT LONG TERM GOAL #4   Title  Pt will demonstrate decrease in NDI by at least 19% in order to demonstrate clinically significant reduction in disability related to neck injury/pain.    Baseline  05/21/19: 26%    Time  12    Period  Weeks    Status  New    Target Date  08/13/19            Plan - 06/18/19 1524    Clinical Impression Statement  Pt demonstrates excellent motivation during session. She denies any further neck pain at this time but does report it feels slightly tight today from working in a chair and looking at a  computer screen. Continued with BLE strengthening as well as balance training today and progressed resistance as  well as challenge of activities. Pt will benefit from PT services to address deficits in strength, balance, and mobility in order to return to full function at home.    Personal Factors and Comorbidities  Age    Examination-Activity Limitations  Squat;Locomotion Level    Examination-Participation Restrictions  Community Activity;Driving    Stability/Clinical Decision Making  Evolving/Moderate complexity    Clinical Decision Making  Moderate    Rehab Potential  Excellent    PT Frequency  2x / week    PT Duration  12 weeks    PT Treatment/Interventions  ADLs/Self Care Home Management;Aquatic Therapy;Biofeedback;Canalith Repostioning;Cryotherapy;Electrical Stimulation;Iontophoresis 4mg /ml Dexamethasone;Moist Heat;Traction;Ultrasound;DME Instruction;Gait training;Stair training;Functional mobility training;Therapeutic activities;Therapeutic exercise;Balance training;Neuromuscular re-education;Patient/family education;Manual techniques;Passive range of motion;Dry needling;Vestibular;Joint Manipulations;Spinal Manipulations    PT Next Visit Plan  Interventions for neck pain, Progress balance and LE strengthening especially LLE, review HEP    PT Home Exercise Plan  Medbridge Access Code: [email or web address]    Consulted and Agree with Plan of Care  Patient       Patient will benefit from skilled therapeutic intervention in order to improve the following deficits and impairments:  Decreased balance, Decreased strength, Pain  Visit Diagnosis: Unsteadiness on feet  Muscle weakness (generalized)     Problem List Patient Active Problem List   Diagnosis Date Noted  . Essential hypertension   . Hyperlipidemia   . Hypothyroidism   . Stage 3a chronic kidney disease   . CVA (cerebral vascular accident) ([place]) 05/12/2019  . Acute cardioembolic stroke ([place]) 65/68/1275   Phillips Grout PT, DPT, GCS   Georgetta Crafton 06/18/2019, 4:22 PM  [place] MAIN [facility] SERVICES [address] [place], [place], [address] Phone: [phone]   Fax:  [phone]  Name: SAKIA SCHRIMPF MRN: [id number] Date of Birth: [birth year]

## 2019-06-20 ENCOUNTER — Ambulatory Visit: Payer: Medicare HMO

## 2019-06-25 ENCOUNTER — Ambulatory Visit: Payer: Medicare HMO

## 2019-06-28 ENCOUNTER — Ambulatory Visit: Payer: Medicare HMO

## 2019-07-01 ENCOUNTER — Other Ambulatory Visit: Payer: Self-pay

## 2019-07-01 ENCOUNTER — Ambulatory Visit: Payer: Medicare HMO

## 2019-07-01 DIAGNOSIS — R2681 Unsteadiness on feet: Secondary | ICD-10-CM

## 2019-07-01 DIAGNOSIS — M6281 Muscle weakness (generalized): Secondary | ICD-10-CM

## 2019-07-01 NOTE — Therapy (Signed)
[place] MAIN [facility] SERVICES [address] [place], [place], [address] Phone: [phone]   Fax:  [phone]  Physical Therapy Treatment  Patient Details  Name: Jenna [name] MRN: [id number] Date of Birth: 04-10-1940 Referring Provider (PT): Lamonte Sakai   Encounter Date: 07/01/2019  PT End of Session - 07/01/19 1304    Visit Number  7    Number of Visits  25    Date for PT Re-Evaluation  08/13/19    Authorization Type  eval: 05/21/19    PT Start Time  1301    PT Stop Time  1345    PT Time Calculation (min)  44 min    Equipment Utilized During Treatment  Gait belt    Activity Tolerance  Patient tolerated treatment well    Behavior During Therapy  [facility] for tasks assessed/performed       Past Medical History:  Diagnosis Date  . Gout   . Hyperlipidemia   . Hypertension   . Thyroid disease     Past Surgical History:  Procedure Laterality Date  . BREAST BIOPSY Right 1978   neg  . CHOLECYSTECTOMY      There were no vitals filed for this visit.  Subjective Assessment - 07/01/19 1304    Subjective  Patient reports she is doing well today. She denies any new onset pain upon arrival today. No stumbles or LOB since last session.    Pertinent History  Pt started with R sided visual issues, slurred speech, and L sided headache on 05/12/19. Her family brought her over to the hospital and she was given tPA by neurology. She was diagnosed with acute cardioembolic strokes with symptoms of left-sided headache, speech and word finding abnormalities, and right-sided visual issues. Patient was admitted as a code stroke by the critical care team. MRI showed multiple acute infarctions consistent with embolic disease. The most extensive areas the left temporoparietal junction.  Patient also had a punctate acute infarction in the right parietal lobe.  No hemorrhage.  Of note also an 11 mm meningioma was seen.  No arrhythmias on telemetry, carotid  ultrasound negative, echocardiogram bubble study negative.  Case discussed with neurology and cardiology.  Patient was discharged home on aspirin and Crestor.  She reports that she had her follow-up appointment with her PCP. She did not have have [place] PT but was referred directly for OP PT.  Pt reports that her headache and word finding issues have resolved. Her vision is improving but she still feels like both eyes are a little more blurry. She is currently wearing a 14 day heart monitor.    Diagnostic tests  MRI: see history    Patient Stated Goals  Decrease neck pain and improve leg strength (especially LLE)    Currently in Pain?  No/denies        TREATMENT   Ther-ex  NuStep L2 x 5 minutes for warm-up during history (4 minutes unbilled); Precor BLE leg press 55# 2 x 20; Precor BLE heel raises 55# 2 x 20;  Seated with 4# ankle weights: Marches x 20 bilateral; LAQ x 20 bilateral;  Standing with 4# ankle weights: HS curls x 20 bilateral; Marching x 20 bilateral; Hip abduction x 20 bilateral; Hip extension x 20 bilateral;  Standing heel raises x 20; Mini squats with BUE support x 10;   Neuromuscular Re-education Airex NBOS eye open/closed 2 x 30s each; Airex NBOS horizontal and vertical head turns 2 x 30s each; Airex alternating  6" step taps x 10 bilateral; One foot on Airex and one foot on 6" step static balance x 30s;   Pt educated throughout session about proper posture and technique with exercises. Improved exercise technique, movement at target joints, use of target muscles after min to mod verbal, visual, tactile cues.   Pt demonstrates excellent motivation during session. Continued with BLE strengthening as well as balance training today and progressed resistance as well as challenge of activities. Initiated quantum leg press and heel raises. Pt will benefit from PT services to address deficits in strength, balance, and mobility in order to return to full  function at home.                          PT Short Term Goals - 05/21/19 1646      PT SHORT TERM GOAL #1   Title  Pt will be independent with HEP in order to improve strength and balance as well as decrease neck pain in order to decrease fall risk and improve pain-free function at home.    Time  6    Period  Weeks    Status  New    Target Date  07/02/19        PT Long Term Goals - 05/21/19 1647      PT LONG TERM GOAL #1   Title  Pt will improve BERG by at least 3 points in order to demonstrate clinically significant improvement in balance.    Baseline  05/21/19: 49/56    Time  12    Period  Weeks    Status  New    Target Date  08/13/19      PT LONG TERM GOAL #2   Title  Pt will improve ABC by at least 13% in order to demonstrate clinically significant improvement in balance confidence.    Baseline  05/21/19: 53.1%    Time  12    Period  Weeks    Status  New    Target Date  08/13/19      PT LONG TERM GOAL #3   Title  Pt will decrease worst neck pain as reported on NPRS by at least 2 points in order to demonstrate clinically significant reduction in pain.    Baseline  05/21/19: Worst: 9/10    Time  12    Period  Weeks    Status  New    Target Date  08/13/19      PT LONG TERM GOAL #4   Title  Pt will demonstrate decrease in NDI by at least 19% in order to demonstrate clinically significant reduction in disability related to neck injury/pain.    Baseline  05/21/19: 26%    Time  12    Period  Weeks    Status  New    Target Date  08/13/19            Plan - 07/01/19 1305    Clinical Impression Statement  Pt demonstrates excellent motivation during session. Continued with BLE strengthening as well as balance training today and progressed resistance as well as challenge of activities. Initiated quantum leg press and heel raises. Pt will benefit from PT services to address deficits in strength, balance, and mobility in order to return to full  function at home.    Personal Factors and Comorbidities  Age    Examination-Activity Limitations  Squat;Locomotion Level    Examination-Participation Restrictions  Community Activity;Driving  Stability/Clinical Decision Making  Evolving/Moderate complexity    Rehab Potential  Excellent    PT Frequency  2x / week    PT Duration  12 weeks    PT Treatment/Interventions  ADLs/Self Care Home Management;Aquatic Therapy;Biofeedback;Canalith Repostioning;Cryotherapy;Electrical Stimulation;Iontophoresis 4mg /ml Dexamethasone;Moist Heat;Traction;Ultrasound;DME Instruction;Gait training;Stair training;Functional mobility training;Therapeutic activities;Therapeutic exercise;Balance training;Neuromuscular re-education;Patient/family education;Manual techniques;Passive range of motion;Dry needling;Vestibular;Joint Manipulations;Spinal Manipulations    PT Next Visit Plan  Interventions for neck pain, Progress balance and LE strengthening especially LLE, review HEP    PT Home Exercise Plan  Medbridge Access Code: [email or web address]    Consulted and Agree with Plan of Care  Patient       Patient will benefit from skilled therapeutic intervention in order to improve the following deficits and impairments:  Decreased balance, Decreased strength, Pain  Visit Diagnosis: Unsteadiness on feet  Muscle weakness (generalized)     Problem List Patient Active Problem List   Diagnosis Date Noted  . Essential hypertension   . Hyperlipidemia   . Hypothyroidism   . Stage 3a chronic kidney disease   . CVA (cerebral vascular accident) ([place]) 05/12/2019  . Acute cardioembolic stroke ([place]) 37/48/2707   Phillips Grout PT, DPT, GCS  Jeancarlo Leffler 07/01/2019, 1:49 PM  [place] MAIN [facility] SERVICES [address] [place], [place], [address] Phone: [phone]   Fax:  [phone]  Name: Jenna [name] MRN: [id number] Date of Birth: 1940-10-02

## 2019-07-03 ENCOUNTER — Ambulatory Visit: Payer: Medicare HMO

## 2019-07-09 ENCOUNTER — Other Ambulatory Visit: Payer: Self-pay

## 2019-07-09 ENCOUNTER — Ambulatory Visit: Payer: Medicare HMO | Attending: Internal Medicine

## 2019-07-09 VITALS — BP 140/74 | HR 90

## 2019-07-09 DIAGNOSIS — R2681 Unsteadiness on feet: Secondary | ICD-10-CM | POA: Diagnosis present

## 2019-07-09 DIAGNOSIS — M6281 Muscle weakness (generalized): Secondary | ICD-10-CM | POA: Insufficient documentation

## 2019-07-09 NOTE — Therapy (Signed)
[place] MAIN [facility] SERVICES [address] [place], [place], [address] Phone: [phone]   Fax:  [phone]  Physical Therapy Treatment  Patient Details  Name: Jenna [name] MRN: [id number] Date of Birth: [birth year] Referring Provider (PT): Lamonte Sakai   Encounter Date: 07/09/2019  PT End of Session - 07/09/19 1523    Visit Number  8    Number of Visits  25    Date for PT Re-Evaluation  08/13/19    Authorization Type  eval: 05/21/19    PT Start Time  1520    PT Stop Time  1600    PT Time Calculation (min)  40 min    Equipment Utilized During Treatment  Gait belt    Activity Tolerance  Patient tolerated treatment well    Behavior During Therapy  [facility] for tasks assessed/performed       Past Medical History:  Diagnosis Date  . Gout   . Hyperlipidemia   . Hypertension   . Thyroid disease     Past Surgical History:  Procedure Laterality Date  . BREAST BIOPSY Right 1978   neg  . CHOLECYSTECTOMY      Vitals:   07/09/19 1524  BP: 140/74  Pulse: 90  SpO2: 99%    Subjective Assessment - 07/09/19 1522    Subjective  Patient reports she is doing well today. She denies any new onset pain upon arrival today. No stumbles or LOB since last session. No specific questions or concerns currently    Pertinent History  Pt started with R sided visual issues, slurred speech, and L sided headache on 05/12/19. Her family brought her over to the hospital and she was given tPA by neurology. She was diagnosed with acute cardioembolic strokes with symptoms of left-sided headache, speech and word finding abnormalities, and right-sided visual issues. Patient was admitted as a code stroke by the critical care team. MRI showed multiple acute infarctions consistent with embolic disease. The most extensive areas the left temporoparietal junction.  Patient also had a punctate acute infarction in the right parietal lobe.  No hemorrhage.  Of note also an 11 mm  meningioma was seen.  No arrhythmias on telemetry, carotid ultrasound negative, echocardiogram bubble study negative.  Case discussed with neurology and cardiology.  Patient was discharged home on aspirin and Crestor.  She reports that she had her follow-up appointment with her PCP. She did not have have [place] [facility] PT but was referred directly for OP PT.  Pt reports that her headache and word finding issues have resolved. Her vision is improving but she still feels like both eyes are a little more blurry. She is currently wearing a 14 day heart monitor.    Diagnostic tests  MRI: see history    Patient Stated Goals  Decrease neck pain and improve leg strength (especially LLE)    Currently in Pain?  No/denies       TREATMENT   Ther-ex NuStep L1/2 x 5 minutes for warm-up during history (2 minutes unbilled); Sit to stand from regular height chair without UE support x 10, added Airex pad under feet x 10; Standing heel raises x 20 with 2 finger support;  Seated with 4# ankle weights: LAQ x 20 bilateral;  Standing with 4# ankle weights: HS curls x 20 bilateral; Marching x 20 bilateral; Hip abduction x 20 bilateral; Hip extension x 20 bilateral;   Neuromuscular Re-education Airex NBOS eye open/closed x 30s each; Airex NBOS horizontal and vertical head turnsx  30s each; 6" orange hurdle forward/backward steps x 10 on each side; 6" orange hurdle lateral steps x 10 on each side;   Pt educated throughout session about proper posture and technique with exercises. Improved exercise technique, movement at target joints, use of target muscles after min to mod verbal, visual, tactile cues.   Pt demonstrates excellent motivation during session.Continued with BLE strengthening as well as balance training today and progressed resistance as well as challenge of activities. Pt demonstrates some difficulty with balance on Airex pad as well as with eyes closed. She demonstrates good stability with  forward/backward and lateral hurdle steps. Pt will benefit from PT services to address deficits in strength, balance, and mobility in order to return to full function at home.                             PT Short Term Goals - 05/21/19 1646      PT SHORT TERM GOAL #1   Title  Pt will be independent with HEP in order to improve strength and balance as well as decrease neck pain in order to decrease fall risk and improve pain-free function at home.    Time  6    Period  Weeks    Status  New    Target Date  07/02/19        PT Long Term Goals - 05/21/19 1647      PT LONG TERM GOAL #1   Title  Pt will improve BERG by at least 3 points in order to demonstrate clinically significant improvement in balance.    Baseline  05/21/19: 49/56    Time  12    Period  Weeks    Status  New    Target Date  08/13/19      PT LONG TERM GOAL #2   Title  Pt will improve ABC by at least 13% in order to demonstrate clinically significant improvement in balance confidence.    Baseline  05/21/19: 53.1%    Time  12    Period  Weeks    Status  New    Target Date  08/13/19      PT LONG TERM GOAL #3   Title  Pt will decrease worst neck pain as reported on NPRS by at least 2 points in order to demonstrate clinically significant reduction in pain.    Baseline  05/21/19: Worst: 9/10    Time  12    Period  Weeks    Status  New    Target Date  08/13/19      PT LONG TERM GOAL #4   Title  Pt will demonstrate decrease in NDI by at least 19% in order to demonstrate clinically significant reduction in disability related to neck injury/pain.    Baseline  05/21/19: 26%    Time  12    Period  Weeks    Status  New    Target Date  08/13/19            Plan - 07/09/19 1523    Clinical Impression Statement  Pt demonstrates excellent motivation during session. Continued with BLE strengthening as well as balance training today and progressed resistance as well as challenge of activities. Pt  demonstrates some difficulty with balance on Airex pad as well as with eyes closed. She demonstrates good stability with forward/backward and lateral hurdle steps. Pt will benefit from PT services to address deficits in strength,  balance, and mobility in order to return to full function at home.    Personal Factors and Comorbidities  Age    Examination-Activity Limitations  Squat;Locomotion Level    Examination-Participation Restrictions  Community Activity;Driving    Stability/Clinical Decision Making  Evolving/Moderate complexity    Rehab Potential  Excellent    PT Frequency  2x / week    PT Duration  12 weeks    PT Treatment/Interventions  ADLs/Self Care Home Management;Aquatic Therapy;Biofeedback;Canalith Repostioning;Cryotherapy;Electrical Stimulation;Iontophoresis 4mg /ml Dexamethasone;Moist Heat;Traction;Ultrasound;DME Instruction;Gait training;Stair training;Functional mobility training;Therapeutic activities;Therapeutic exercise;Balance training;Neuromuscular re-education;Patient/family education;Manual techniques;Passive range of motion;Dry needling;Vestibular;Joint Manipulations;Spinal Manipulations    PT Next Visit Plan  Interventions for neck pain, Progress balance and LE strengthening especially LLE, review HEP    PT Home Exercise Plan  Medbridge Access Code: [email or web address]    Consulted and Agree with Plan of Care  Patient       Patient will benefit from skilled therapeutic intervention in order to improve the following deficits and impairments:  Decreased balance, Decreased strength, Pain  Visit Diagnosis: Unsteadiness on feet  Muscle weakness (generalized)     Problem List Patient Active Problem List   Diagnosis Date Noted  . Essential hypertension   . Hyperlipidemia   . Hypothyroidism   . Stage 3a chronic kidney disease   . CVA (cerebral vascular accident) ([place]) 05/12/2019  . Acute cardioembolic stroke ([place]) 50/56/9794   Phillips Grout PT, DPT, GCS   Vickey Boak 07/10/2019, 3:38 PM  [place] MAIN [facility] SERVICES [address] [place], [place], [address] Phone: [phone]   Fax:  [phone]  Name: Jenna [name] MRN: [id number] Date of Birth: 1941-03-21

## 2019-07-11 ENCOUNTER — Ambulatory Visit: Payer: Medicare HMO

## 2019-07-15 ENCOUNTER — Ambulatory Visit: Payer: Medicare HMO

## 2019-07-15 ENCOUNTER — Other Ambulatory Visit: Payer: Self-pay

## 2019-07-15 DIAGNOSIS — M6281 Muscle weakness (generalized): Secondary | ICD-10-CM

## 2019-07-15 DIAGNOSIS — R2681 Unsteadiness on feet: Secondary | ICD-10-CM

## 2019-07-15 NOTE — Telephone Encounter (Signed)
Returned call to patient. She reported she looked at [facility] and made a mistake thinking there was some new information.   No further questions or needs at this time.

## 2019-07-15 NOTE — Telephone Encounter (Signed)
Patient calling to discuss recent testing results  ° °Please call  ° °

## 2019-07-15 NOTE — Therapy (Signed)
[place] MAIN [facility] SERVICES [address] [place], [place], [address] Phone: [phone]   Fax:  [phone]  Physical Therapy Treatment  Patient Details  Name: Jenna [name] MRN: [id number] Date of Birth: February 11, 1941 Referring Provider (PT): Lamonte Sakai   Encounter Date: 07/15/2019  PT End of Session - 07/15/19 1304    Visit Number  9    Number of Visits  25    Date for PT Re-Evaluation  08/13/19    Authorization Type  eval: 05/21/19    PT Start Time  1300    PT Stop Time  1345    PT Time Calculation (min)  45 min    Equipment Utilized During Treatment  Gait belt    Activity Tolerance  Patient tolerated treatment well    Behavior During Therapy  [facility] for tasks assessed/performed       Past Medical History:  Diagnosis Date  . Gout   . Hyperlipidemia   . Hypertension   . Thyroid disease     Past Surgical History:  Procedure Laterality Date  . BREAST BIOPSY Right 1978   neg  . CHOLECYSTECTOMY      There were no vitals filed for this visit.  Subjective Assessment - 07/15/19 1258    Subjective  Patient reports she is doing well today. She denies any new onset pain upon arrival today. No stumbles or LOB since last session. No specific questions or concerns currently    Pertinent History  Pt started with R sided visual issues, slurred speech, and L sided headache on 05/12/19. Her family brought her over to the hospital and she was given tPA by neurology. She was diagnosed with acute cardioembolic strokes with symptoms of left-sided headache, speech and word finding abnormalities, and right-sided visual issues. Patient was admitted as a code stroke by the critical care team. MRI showed multiple acute infarctions consistent with embolic disease. The most extensive areas the left temporoparietal junction.  Patient also had a punctate acute infarction in the right parietal lobe.  No hemorrhage.  Of note also an 11 mm meningioma was seen.  No  arrhythmias on telemetry, carotid ultrasound negative, echocardiogram bubble study negative.  Case discussed with neurology and cardiology.  Patient was discharged home on aspirin and Crestor.  She reports that she had her follow-up appointment with her PCP. She did not have have [place] PT but was referred directly for OP PT.  Pt reports that her headache and word finding issues have resolved. Her vision is improving but she still feels like both eyes are a little more blurry. She is currently wearing a 14 day heart monitor.    Diagnostic tests  MRI: see history    Patient Stated Goals  Decrease neck pain and improve leg strength (especially LLE)    Currently in Pain?  No/denies          TREATMENT   Ther-ex NuStep L2-4 x 5 minutes for warm-up during history (3 minutes unbilled); Matrix resisted gait 17.5# forward/backward/R lateral/L lateral x 2 each direction with CGA; Sit to stand from regular height chair without UE support, Airex under feet, with 2kg overhead medicine ball press x 10; BOSU (round side up) forward lunges without UE support alternating leading LE x 10 each; BOSU (round side up) squats without UE support x 10; Standing heel raises x 20 with 2 finger support; Squats with BUE support x 10;  Seated with 4# ankle weights: Marching x 20 bilateral; LAQ x  20 bilateral;   Neuromuscular Re-education BOSU (round side up) static balance without UE support x 30s; Airex NBOS horizontal head turns x 30s each; Airex NBOS alternating 6" step taps x 10 bilateral;   Pt educated throughout session about proper posture and technique with exercises. Improved exercise technique, movement at target joints, use of target muscles after min to mod verbal, visual, tactile cues.   Pt demonstrates excellent motivation during session.Continued with BLE strengthening as well as balance training today. Initiated resisted Matrix walking which patient finds challenging. With ankle weight  focused on seated exercises due to mild increase in bilateral knee pain toward the end of the session. Pt demonstrates some difficulty with balance on Airex pad and BOSU.Pt will benefit from PT services to address deficits in strength, balance, and mobility in order to return to full function at home.                        PT Short Term Goals - 05/21/19 1646      PT SHORT TERM GOAL #1   Title  Pt will be independent with HEP in order to improve strength and balance as well as decrease neck pain in order to decrease fall risk and improve pain-free function at home.    Time  6    Period  Weeks    Status  New    Target Date  07/02/19        PT Long Term Goals - 05/21/19 1647      PT LONG TERM GOAL #1   Title  Pt will improve BERG by at least 3 points in order to demonstrate clinically significant improvement in balance.    Baseline  05/21/19: 49/56    Time  12    Period  Weeks    Status  New    Target Date  08/13/19      PT LONG TERM GOAL #2   Title  Pt will improve ABC by at least 13% in order to demonstrate clinically significant improvement in balance confidence.    Baseline  05/21/19: 53.1%    Time  12    Period  Weeks    Status  New    Target Date  08/13/19      PT LONG TERM GOAL #3   Title  Pt will decrease worst neck pain as reported on NPRS by at least 2 points in order to demonstrate clinically significant reduction in pain.    Baseline  05/21/19: Worst: 9/10    Time  12    Period  Weeks    Status  New    Target Date  08/13/19      PT LONG TERM GOAL #4   Title  Pt will demonstrate decrease in NDI by at least 19% in order to demonstrate clinically significant reduction in disability related to neck injury/pain.    Baseline  05/21/19: 26%    Time  12    Period  Weeks    Status  New    Target Date  08/13/19            Plan - 07/15/19 1304    Clinical Impression Statement  Pt demonstrates excellent motivation during session. Continued  with BLE strengthening as well as balance training today. Initiated resisted Matrix walking which patient finds challenging. With ankle weight focused on seated exercises due to mild increase in bilateral knee pain toward the end of the session. Pt demonstrates some difficulty  with balance on Airex pad and BOSU. Pt will benefit from PT services to address deficits in strength, balance, and mobility in order to return to full function at home.    Personal Factors and Comorbidities  Age    Examination-Activity Limitations  Squat;Locomotion Level    Examination-Participation Restrictions  Community Activity;Driving    Stability/Clinical Decision Making  Evolving/Moderate complexity    Rehab Potential  Excellent    PT Frequency  2x / week    PT Duration  12 weeks    PT Treatment/Interventions  ADLs/Self Care Home Management;Aquatic Therapy;Biofeedback;Canalith Repostioning;Cryotherapy;Electrical Stimulation;Iontophoresis 4mg /ml Dexamethasone;Moist Heat;Traction;Ultrasound;DME Instruction;Gait training;Stair training;Functional mobility training;Therapeutic activities;Therapeutic exercise;Balance training;Neuromuscular re-education;Patient/family education;Manual techniques;Passive range of motion;Dry needling;Vestibular;Joint Manipulations;Spinal Manipulations    PT Next Visit Plan  Update outcome measures, goals, and progress note; Interventions for neck pain, Progress balance and LE strengthening especially LLE, review HEP    PT Home Exercise Plan  Medbridge Access Code: [email or web address]    Consulted and Agree with Plan of Care  Patient       Patient will benefit from skilled therapeutic intervention in order to improve the following deficits and impairments:  Decreased balance, Decreased strength, Pain  Visit Diagnosis: Unsteadiness on feet  Muscle weakness (generalized)     Problem List Patient Active Problem List   Diagnosis Date Noted  . Essential hypertension   . Hyperlipidemia   .  Hypothyroidism   . Stage 3a chronic kidney disease   . CVA (cerebral vascular accident) ([place]) 05/12/2019  . Acute cardioembolic stroke ([place]) 41/93/7902   Phillips Grout PT, DPT, GCS  Errick Salts 07/15/2019, 1:58 PM  [place] MAIN [facility] SERVICES [address] [place], [place], [address] Phone: [phone]   Fax:  [phone]  Name: SIDDALEE VANDERHEIDEN MRN: [id number] Date of Birth: May 18, 1940

## 2019-07-17 ENCOUNTER — Ambulatory Visit: Payer: Medicare HMO

## 2019-07-22 ENCOUNTER — Other Ambulatory Visit: Payer: Self-pay

## 2019-07-22 ENCOUNTER — Ambulatory Visit: Payer: Medicare HMO

## 2019-07-22 DIAGNOSIS — R2681 Unsteadiness on feet: Secondary | ICD-10-CM | POA: Diagnosis not present

## 2019-07-22 DIAGNOSIS — M6281 Muscle weakness (generalized): Secondary | ICD-10-CM

## 2019-07-22 NOTE — Patient Instructions (Signed)
Access Code: [email or web address] URL: [email or web address] Date: 07/22/2019 Prepared by: Roxana Hires  Exercises Seated Knee Lifts with Resistance - 1 x daily - 7 x weekly - 10 reps - 2 sets - 3s hold Seated Hip Abduction with Resistance - 1 x daily - 7 x weekly - 10 reps - 2 sets - 3s hold Sit to Stand without Arm Support - 1 x daily - 7 x weekly - 10 reps - 2 sets Standing Tandem Balance with Unilateral Counter Support - 1 x daily - 7 x weekly - 3 x 30s with each foot forward hold Tandem Walking Next to Counter - 1 x daily - 7 x weekly - 30s x 3 hold Standing Single Leg Stance with Unilateral Counter Support - 1 x daily - 7 x weekly - 30s x 3 on each foot hold

## 2019-07-22 NOTE — Therapy (Signed)
[place] MAIN [facility] SERVICES [address] [place], [place], [address] Phone: [phone]   Fax:  [phone]  Physical Therapy Progress Note   Dates of reporting period  05/21/19   to   07/22/19  Patient Details  Name: Jenna [name] MRN: [id number] Date of Birth: November 11, 1940 Referring Provider (PT): Lamonte Sakai   Encounter Date: 07/22/2019  PT End of Session - 07/22/19 1306    Visit Number  10    Number of Visits  25    Date for PT Re-Evaluation  08/13/19    Authorization Type  eval: 05/21/19    PT Start Time  1301    PT Stop Time  1345    PT Time Calculation (min)  44 min    Equipment Utilized During Treatment  Gait belt    Activity Tolerance  Patient tolerated treatment well    Behavior During Therapy  [facility] for tasks assessed/performed       Past Medical History:  Diagnosis Date  . Gout   . Hyperlipidemia   . Hypertension   . Thyroid disease     Past Surgical History:  Procedure Laterality Date  . BREAST BIOPSY Right 1978   neg  . CHOLECYSTECTOMY      There were no vitals filed for this visit.  Subjective Assessment - 07/22/19 1305    Subjective  Patient reports she is doing well today. She denies any new onset pain upon arrival today. No stumbles or LOB since last session. No specific questions or concerns currently    Pertinent History  Pt started with R sided visual issues, slurred speech, and L sided headache on 05/12/19. Her family brought her over to the hospital and she was given tPA by neurology. She was diagnosed with acute cardioembolic strokes with symptoms of left-sided headache, speech and word finding abnormalities, and right-sided visual issues. Patient was admitted as a code stroke by the critical care team. MRI showed multiple acute infarctions consistent with embolic disease. The most extensive areas the left temporoparietal junction.  Patient also had a punctate acute infarction in the right parietal lobe.  No  hemorrhage.  Of note also an 11 mm meningioma was seen.  No arrhythmias on telemetry, carotid ultrasound negative, echocardiogram bubble study negative.  Case discussed with neurology and cardiology.  Patient was discharged home on aspirin and Crestor.  She reports that she had her follow-up appointment with her PCP. She did not have have [facility] but was referred directly for OP PT.  Pt reports that her headache and word finding issues have resolved. Her vision is improving but she still feels like both eyes are a little more blurry. She is currently wearing a 14 day heart monitor.    Diagnostic tests  MRI: see history    Patient Stated Goals  Decrease neck pain and improve leg strength (especially LLE)    Currently in Pain?  No/denies       TREATMENT    Neuromuscular Re-education  Updated outcome measures/goals with patient (see below)    FUNCTIONAL OUTCOME MEASURES    Results Comments  BERG 19/30 (49/56) Fall risk, in need of intervention  NDI 2% (26%) Mild disability  ABC Scale 65% (53.1%) Low balance confidence  FOTO 69 (66) Goal is 76  FGA 19/30 Fall risk, in need of intervention    Reviewed HEP, modified, and provided new handout with education;  [facility] PT Assessment - 07/22/19 1326      Merrilee Jansky  Balance Test   Sit to Stand  Able to stand without using hands and stabilize independently    Standing Unsupported  Able to stand safely 2 minutes    Sitting with Back Unsupported but Feet Supported on Floor or Stool  Able to sit safely and securely 2 minutes    Stand to Sit  Sits safely with minimal use of hands    Transfers  Able to transfer safely, minor use of hands    Standing Unsupported with Eyes Closed  Able to stand 10 seconds safely    Standing Unsupported with Feet Together  Able to place feet together independently and stand 1 minute safely    From Standing, Reach Forward with Outstretched Arm  Can reach confidently >25 cm (10")    From Standing Position, Pick up Object from  Floor  Able to pick up shoe safely and easily    From Standing Position, Turn to Look Behind Over each Shoulder  Looks behind from both sides and weight shifts well    Turn 360 Degrees  Able to turn 360 degrees safely in 4 seconds or less    Standing Unsupported, Alternately Place Feet on Step/Stool  Able to stand independently and safely and complete 8 steps in 20 seconds    Standing Unsupported, One Foot in Front  Able to plae foot ahead of the other independently and hold 30 seconds    Standing on One Leg  Able to lift leg independently and hold 5-10 seconds    Total Score  54      Functional Gait  Assessment   Gait assessed   Yes    Gait Level Surface  Walks 20 ft in less than 5.5 sec, no assistive devices, good speed, no evidence for imbalance, normal gait pattern, deviates no more than 6 in outside of the 12 in walkway width.    Change in Gait Speed  Able to smoothly change walking speed without loss of balance or gait deviation. Deviate no more than 6 in outside of the 12 in walkway width.    Gait with Horizontal Head Turns  Performs head turns smoothly with slight change in gait velocity (eg, minor disruption to smooth gait path), deviates 6-10 in outside 12 in walkway width, or uses an assistive device.    Gait with Vertical Head Turns  Performs task with slight change in gait velocity (eg, minor disruption to smooth gait path), deviates 6 - 10 in outside 12 in walkway width or uses assistive device    Gait and Pivot Turn  Pivot turns safely within 3 sec and stops quickly with no loss of balance.    Step Over Obstacle  Is able to step over one shoe box (4.5 in total height) without changing gait speed. No evidence of imbalance.    Gait with Narrow Base of Support  Ambulates less than 4 steps heel to toe or cannot perform without assistance.    Gait with Eyes Closed  Walks 20 ft, slow speed, abnormal gait pattern, evidence for imbalance, deviates 10-15 in outside 12 in walkway width.  Requires more than 9 sec to ambulate 20 ft.    Ambulating Backwards  Walks 20 ft, uses assistive device, slower speed, mild gait deviations, deviates 6-10 in outside 12 in walkway width.    Steps  Two feet to a stair, must use rail.    Total Score  19            PT Short Term Goals -  07/22/19 1308      PT SHORT TERM GOAL #1   Title  Pt will be independent with HEP in order to improve strength and balance as well as decrease neck pain in order to decrease fall risk and improve pain-free function at home.    Baseline  07/22/19: Performing at least 3 days/week    Time  6    Period  Weeks    Status  Partially Met    Target Date  07/02/19        PT Long Term Goals - 07/22/19 1308      PT LONG TERM GOAL #1   Title  Pt will improve BERG by at least 3 points in order to demonstrate clinically significant improvement in balance.    Baseline  05/21/19: 49/56; 07/22/19: 54/56;    Time  12    Period  Weeks    Status  Achieved      PT LONG TERM GOAL #2   Title  Pt will improve ABC by at least 13% in order to demonstrate clinically significant improvement in balance confidence.    Baseline  05/21/19: 53.1%; 07/22/19: 65%    Time  12    Period  Weeks    Status  Partially Met    Target Date  08/13/19      PT LONG TERM GOAL #3   Title  Pt will decrease worst neck pain as reported on NPRS by at least 2 points in order to demonstrate clinically significant reduction in pain.    Baseline  05/21/19: Worst: 9/10; 07/22/19: Neck pain has been resolved for multiple weeks without recurrence;    Time  12    Period  Weeks    Status  Achieved      PT LONG TERM GOAL #4   Title  Pt will demonstrate decrease in NDI by at least 19% in order to demonstrate clinically significant reduction in disability related to neck injury/pain.    Baseline  05/21/19: 26%; 07/22/19: 2%    Time  12    Period  Weeks    Status  Achieved      PT LONG TERM GOAL #5   Title  Pt will increase FOTO score to at least 76 in  order to demonstrate clinically significant improvement in symptoms    Baseline  05/21/19: 66; 07/22/19: 69    Time  12    Period  Weeks    Status  New    Target Date  08/13/19            Plan - 07/22/19 1306    Personal Factors and Comorbidities  Age    Examination-Activity Limitations  Squat;Locomotion Level    Examination-Participation Restrictions  Community Activity;Driving    Stability/Clinical Decision Making  Evolving/Moderate complexity    Rehab Potential  Excellent    PT Frequency  2x / week    PT Duration  12 weeks    PT Treatment/Interventions  ADLs/Self Care Home Management;Aquatic Therapy;Biofeedback;Canalith Repostioning;Cryotherapy;Electrical Stimulation;Iontophoresis 30m/ml Dexamethasone;Moist Heat;Traction;Ultrasound;DME Instruction;Gait training;Stair training;Functional mobility training;Therapeutic activities;Therapeutic exercise;Balance training;Neuromuscular re-education;Patient/family education;Manual techniques;Passive range of motion;Dry needling;Vestibular;Joint Manipulations;Spinal Manipulations    PT Next Visit Plan  Update outcome measures, goals, and progress note; Interventions for neck pain, Progress balance and LE strengthening especially LLE, review HEP    PT Home Exercise Plan  Medbridge Access Code: [email or web address]    Consulted and Agree with Plan of Care  Patient       Patient will benefit from skilled therapeutic  intervention in order to improve the following deficits and impairments:  Decreased balance, Decreased strength, Pain  Visit Diagnosis: Unsteadiness on feet  Muscle weakness (generalized)     Problem List Patient Active Problem List   Diagnosis Date Noted  . Essential hypertension   . Hyperlipidemia   . Hypothyroidism   . Stage 3a chronic kidney disease   . CVA (cerebral vascular accident) ([place]) 05/12/2019  . Acute cardioembolic stroke ([place]) [id number]/94/4739   Phillips Grout PT, DPT, GCS  Richardson Dubree 07/22/2019, 3:35 PM  [place] MAIN [facility] SERVICES [address] [place], [place], [address] Phone: [phone]   Fax:  [phone]  Name: Jenna [name] MRN: [id number] Date of Birth: Jun 18, 1940

## 2019-07-24 ENCOUNTER — Ambulatory Visit: Payer: Medicare HMO

## 2019-07-26 ENCOUNTER — Other Ambulatory Visit: Payer: Self-pay

## 2019-07-26 ENCOUNTER — Encounter: Payer: Self-pay | Admitting: Cardiology

## 2019-07-26 ENCOUNTER — Ambulatory Visit: Payer: Medicare HMO | Admitting: Cardiology

## 2019-07-26 VITALS — BP 120/66 | HR 95 | Ht 65.0 in | Wt 177.0 lb

## 2019-07-26 DIAGNOSIS — I639 Cerebral infarction, unspecified: Secondary | ICD-10-CM | POA: Diagnosis not present

## 2019-07-26 DIAGNOSIS — E7800 Pure hypercholesterolemia, unspecified: Secondary | ICD-10-CM | POA: Diagnosis not present

## 2019-07-26 NOTE — Patient Instructions (Addendum)
Medication Instructions:  No changes  *If you need a refill on your cardiac medications before your next appointment, please call your pharmacy*   Lab Work: None  If you have labs (blood work) drawn today and your tests are completely normal, you will receive your results only by: Marland Kitchen MyChart Message (if you have MyChart) OR . A paper copy in the mail If you have any lab test that is abnormal or we need to change your treatment, we will call you to review the results.   Testing/Procedures: None   Follow-Up: At [facility], you and your health needs are our priority.  As part of our continuing mission to provide you with exceptional heart care, we have created designated Provider Care Teams.  These Care Teams include your primary Cardiologist (physician) and Advanced Practice Providers (APPs -  Physician Assistants and Nurse Practitioners) who all work together to provide you with the care you need, when you need it.  Your next appointment:   Follow as needed.

## 2019-07-26 NOTE — Progress Notes (Signed)
Cardiology Office Note:    Date:  07/26/2019   ID:  Jenna [name], DOB Aug 12, 1940, MRN [id number]  PCP:  Jenna Maltese, MD  Cardiologist:  Jenna Sable, MD  Electrophysiologist:  None   Referring MD: Jenna Maltese, MD   Chief Complaint  Patient presents with  . Other    follow up post Zio monitor. meds reviewed verbally with patient.     History of Present Illness:    Jenna [name] is a 79 y.o. female with a hx of hypertension, hyperlipidemia, thyroid disease, CVA who is being seen due to history of CVA.  Cardiac work-up with echocardiogram and telemetry monitoring so far has been unrevealing.  Due to MRI brain showing multiple infarcts involving both hemispheres, wanted to make sure cardioembolic etiology such as A. fib or flutter is not present.  A repeat 2-week cardiac monitor was placed.  Patient states feeling well, currently undergoing physical therapy and balance training.  Historical notes On 05/12/2019, patient had difficulty speaking and slurred speech she was taken to the ED by her husband and was diagnosed with an acute stroke and given TPA.  MRI of the brain showed multiple acute infarctions involving both hemispheres consistent with embolic disease.  While in the hospital, she was evaluated by cardiology at which time she reported intermittent palpitations.  She denies history of A. Fib. Work-up with transthoracic echo with bubble study, carotid Dopplers and telemetry monitoring were unrevealing.  Patient was started on aspirin and high intensity statin for secondary prevention.  A 2-week cardiac monitor was also placed not showing any A. fib or flutter.  Past Medical History:  Diagnosis Date  . Gout   . Hyperlipidemia   . Hypertension   . Thyroid disease     Past Surgical History:  Procedure Laterality Date  . BREAST BIOPSY Right 1978   neg  . CHOLECYSTECTOMY      Current Medications: Current Meds  Medication Sig  . allopurinol (ZYLOPRIM) 100 MG  tablet Take 100 mg by mouth 2 (two) times daily.  Marland Kitchen aspirin EC 325 MG EC tablet Take 1 tablet (325 mg total) by mouth daily.  . Calcium Carbonate-Vitamin D 600-200 MG-UNIT TABS Take 1 tablet by mouth 2 (two) times daily with a meal.  . levothyroxine (SYNTHROID) 50 MCG tablet Take 50 mcg by mouth daily.  . Multiple Vitamin (MULTIVITAMIN) tablet Take 1 tablet by mouth daily.  . rosuvastatin (CRESTOR) 40 MG tablet Take 1 tablet (40 mg total) by mouth daily at 6 PM.     Allergies:   Nicotine   Social History   Socioeconomic History  . Marital status: Married    Spouse name: Not on file  . Number of children: Not on file  . Years of education: Not on file  . Highest education level: Not on file  Occupational History  . Not on file  Tobacco Use  . Smoking status: Current Every Day Smoker    Packs/day: 0.25    Types: Cigarettes  . Smokeless tobacco: Never Used  . Tobacco comment: smoking 4-5 cigarettes daily.   Substance and Sexual Activity  . Alcohol use: No  . Drug use: No  . Sexual activity: Not on file  Other Topics Concern  . Not on file  Social History Narrative  . Not on file   Social Determinants of Health   Financial Resource Strain:   . Difficulty of Paying Living Expenses:   Food Insecurity:   . Worried About Running  Out of Food in the Last Year:   . [place] in the Last Year:   Transportation Needs:   . Lack of Transportation (Medical):   Marland Kitchen Lack of Transportation (Non-Medical):   Physical Activity:   . Days of Exercise per Week:   . Minutes of Exercise per Session:   Stress:   . Feeling of Stress :   Social Connections:   . Frequency of Communication with Friends and Family:   . Frequency of Social Gatherings with Friends and Family:   . Attends Religious Services:   . Active Member of Clubs or Organizations:   . Attends Archivist Meetings:   Marland Kitchen Marital Status:      Family History: The patient's family history includes Breast cancer in  her cousin; Breast cancer (age of onset: 31) in her maternal grandmother.  ROS:   Please see the history of present illness.     All other systems reviewed and are negative.  EKGs/Labs/Other Studies Reviewed:    The following studies were reviewed today:   EKG:  EKG not obtained today  Recent Labs: 05/12/2019: ALT 10; B Natriuretic Peptide 50.0 05/13/2019: Hemoglobin 11.8; Magnesium 2.2; Platelets 218 05/14/2019: BUN 19; Creatinine, Ser 1.10; Potassium 3.9; Sodium 140  Recent Lipid Panel    Component Value Date/Time   CHOL 178 05/13/2019 0449   TRIG 236 (H) 05/13/2019 0449   HDL 28 (L) 05/13/2019 0449   CHOLHDL 6.4 05/13/2019 0449   VLDL 47 (H) 05/13/2019 0449   LDLCALC 103 (H) 05/13/2019 0449    Physical Exam:    VS:  BP 120/66 (BP Location: Left Arm, Patient Position: Sitting, Cuff Size: Normal)   Pulse 95   Ht 5\' 5"  (1.651 m)   Wt 177 lb (80.3 kg)   SpO2 97%   BMI 29.45 kg/m     Wt Readings from Last 3 Encounters:  07/26/19 177 lb (80.3 kg)  06/17/19 181 lb 6 oz (82.3 kg)  05/14/19 186 lb 1.1 oz (84.4 kg)     GEN:  Well nourished, well developed in no acute distress HEENT: Normal NECK: No JVD; No carotid bruits LYMPHATICS: No lymphadenopathy CARDIAC: RRR, no murmurs, rubs, gallops RESPIRATORY:  Clear to auscultation without rales, wheezing or rhonchi  ABDOMEN: Soft, non-tender, non-distended MUSCULOSKELETAL:  No edema; No deformity  SKIN: Warm and dry NEUROLOGIC:  Alert and oriented x 3 PSYCHIATRIC:  Normal affect   ASSESSMENT:    1. Cerebrovascular accident (CVA), unspecified mechanism ([place])   2. Pure hypercholesterolemia    PLAN:    In order of problems listed above:  1. Patient with history of CVA.  Original 2-week cardiac monitor did not show any evidence for atrial fibrillation or atrial flutter.  Transthoracic echocardiogram showed normal ejection fraction with EF of 55 to 60%.  Saline contrast study was negative for interatrial shunt.  Repeat  2-week cardiac monitor did not show any evidence of atrial fibrillation or flutter.  Continue aspirin and statin as prescribed. 2. History of hyperlipidemia.  Continue Crestor 40 mg daily.  Follow-up as needed.  Medication Adjustments/Labs and Tests Ordered: Current medicines are reviewed at length with the patient today.  Concerns regarding medicines are outlined above.  No orders of the defined types were placed in this encounter.  No orders of the defined types were placed in this encounter.   Patient Instructions  Medication Instructions:  No changes  *If you need a refill on your cardiac medications before your next appointment,  please call your pharmacy*   Lab Work: None  If you have labs (blood work) drawn today and your tests are completely normal, you will receive your results only by: Marland Kitchen MyChart Message (if you have MyChart) OR . A paper copy in the mail If you have any lab test that is abnormal or we need to change your treatment, we will call you to review the results.   Testing/Procedures: None   Follow-Up: At [facility], you and your health needs are our priority.  As part of our continuing mission to provide you with exceptional heart care, we have created designated Provider Care Teams.  These Care Teams include your primary Cardiologist (physician) and Advanced Practice Providers (APPs -  Physician Assistants and Nurse Practitioners) who all work together to provide you with the care you need, when you need it.  Your next appointment:   Follow as needed.     Signed, Jenna Sable, MD  07/26/2019 11:42 AM    [place]

## 2019-07-29 ENCOUNTER — Ambulatory Visit: Payer: Medicare HMO

## 2019-07-31 ENCOUNTER — Ambulatory Visit: Payer: Medicare HMO

## 2019-08-06 ENCOUNTER — Ambulatory Visit: Payer: Medicare HMO

## 2019-08-12 ENCOUNTER — Ambulatory Visit: Payer: Medicare HMO | Attending: Internal Medicine

## 2019-08-12 ENCOUNTER — Other Ambulatory Visit: Payer: Self-pay

## 2019-08-12 DIAGNOSIS — M6281 Muscle weakness (generalized): Secondary | ICD-10-CM | POA: Diagnosis present

## 2019-08-12 DIAGNOSIS — R2681 Unsteadiness on feet: Secondary | ICD-10-CM | POA: Diagnosis present

## 2019-08-12 NOTE — Addendum Note (Signed)
Addended by: Roxana Hires D on: 08/12/2019 03:08 PM   Modules accepted: Orders

## 2019-08-12 NOTE — Therapy (Signed)
[place] MAIN [facility] SERVICES [address] [place], [place], [address] Phone: [phone]   Fax:  [phone]  Physical Therapy Treatment/Recertification  Patient Details  Name: Jenna [name] MRN: [id number] Date of Birth: Mar 01, 1941 Referring Provider (PT): Lamonte Sakai   Encounter Date: 08/12/2019  PT End of Session - 08/12/19 1351    Visit Number  11    Number of Visits  33    Date for PT Re-Evaluation  09/09/19    Authorization Type  eval: 05/21/19    PT Start Time  1345    PT Stop Time  1430    PT Time Calculation (min)  45 min    Equipment Utilized During Treatment  Gait belt    Activity Tolerance  Patient tolerated treatment well    Behavior During Therapy  [facility] for tasks assessed/performed       Past Medical History:  Diagnosis Date  . Gout   . Hyperlipidemia   . Hypertension   . Thyroid disease     Past Surgical History:  Procedure Laterality Date  . BREAST BIOPSY Right 1978   neg  . CHOLECYSTECTOMY      There were no vitals filed for this visit.  Subjective Assessment - 08/12/19 1350    Subjective  Patient reports she is doing well today. She denies any new onset pain upon arrival today but states that her knees are painful today. No stumbles or LOB since last session. No specific questions or concerns currently    Pertinent History  Pt started with R sided visual issues, slurred speech, and L sided headache on 05/12/19. Her family brought her over to the hospital and she was given tPA by neurology. She was diagnosed with acute cardioembolic strokes with symptoms of left-sided headache, speech and word finding abnormalities, and right-sided visual issues. Patient was admitted as a code stroke by the critical care team. MRI showed multiple acute infarctions consistent with embolic disease. The most extensive areas the left temporoparietal junction.  Patient also had a punctate acute infarction in the right parietal lobe.   No hemorrhage.  Of note also an 11 mm meningioma was seen.  No arrhythmias on telemetry, carotid ultrasound negative, echocardiogram bubble study negative.  Case discussed with neurology and cardiology.  Patient was discharged home on aspirin and Crestor.  She reports that she had her follow-up appointment with her PCP. She did not have have [place] [facility] but was referred directly for OP PT.  Pt reports that her headache and word finding issues have resolved. Her vision is improving but she still feels like both eyes are a little more blurry. She is currently wearing a 14 day heart monitor.    Diagnostic tests  MRI: see history    Patient Stated Goals  Decrease neck pain and improve leg strength (especially LLE)    Currently in Pain?  Yes    Pain Score  10-Worst pain ever   9/10 L knee   Pain Location  Knee    Pain Orientation  Right    Pain Descriptors / Indicators  Aching    Pain Type  Chronic pain    Pain Onset  More than a month ago         TREATMENT   Ther-ex NuStep L2 x 5 minutes for warm-up during history (40mnutes unbilled); Matrix resisted gait 17.5# forward/backward/R lateral/L lateral x 2 each direction with CGA; Sit to stand from regular height chair without UE support, Airex under  feet, with 3kg overhead medicine ball press x 10; Wedding marches with red tband around ankles x 30'; Resisted side stepping with red tband around ankles x 15' each direction;  Seated with 4# ankle weights: Marching x 20 bilateral; LAQ x 20 bilateral;  Standing hip abduction with 4# ankle weights; Standing heel raises with BUE support x 20;   Neuromuscular Re-education 1/2 foam roll A/P balance x 30s; 1/2 foam roll A/P heel/toe rocking x 10 each direction; 1/2 foam roll tandem balance alternating forward LE x 30s with each leg forward;   Pt educated throughout session about proper posture and technique with exercises. Improved exercise technique, movement at target joints, use of target  muscles after min to mod verbal, visual, tactile cues.   Pt demonstrates excellent motivation during session.Continued with BLE strengthening as well as balance training today. Repeated resisted Matrix walking which patient finds challenging especially in the lateral direction. Performed wedding marches and thera band resisted walking which is also difficult for patient. Her outcome measures were updated during last session. She now has no further neck pain and her NDI has decreased to 2%. Her BERG improved from 49/56 to 54/56 and her ABC has also increased. Her FOTO score improved from 66 to 69. Performed FGA and she scored 19/30 which demonstrates continual deficits in balance. She will benefit from an additional 4 weeks of therapy at which time she will be discharged to continue exercises independently.Pt will benefit from PT services to address deficits in strength, balance, and mobility in order to return to full function at home.                         PT Short Term Goals - 08/12/19 1448      PT SHORT TERM GOAL #1   Title  Pt will be independent with HEP in order to improve strength and balance as well as decrease neck pain in order to decrease fall risk and improve pain-free function at home.    Baseline  07/22/19: Performing at least 3 days/week    Time  6    Period  Weeks    Status  Partially Met    Target Date  07/02/19        PT Long Term Goals - 08/12/19 1448      PT LONG TERM GOAL #1   Title  Pt will improve BERG by at least 3 points in order to demonstrate clinically significant improvement in balance.    Baseline  05/21/19: 49/56; 07/22/19: 54/56;    Time  12    Period  Weeks    Status  Achieved      PT LONG TERM GOAL #2   Title  Pt will improve ABC by at least 13% in order to demonstrate clinically significant improvement in balance confidence.    Baseline  05/21/19: 53.1%; 07/22/19: 65%    Time  12    Period  Weeks    Status  Partially Met     Target Date  09/09/19      PT LONG TERM GOAL #3   Title  Pt will decrease worst neck pain as reported on NPRS by at least 2 points in order to demonstrate clinically significant reduction in pain.    Baseline  05/21/19: Worst: 9/10; 07/22/19: Neck pain has been resolved for multiple weeks without recurrence;    Time  12    Period  Weeks    Status  Achieved  PT LONG TERM GOAL #4   Title  Pt will demonstrate decrease in NDI by at least 19% in order to demonstrate clinically significant reduction in disability related to neck injury/pain.    Baseline  05/21/19: 26%; 07/22/19: 2%    Time  12    Period  Weeks    Status  Achieved      PT LONG TERM GOAL #5   Title  Pt will increase FOTO score to at least 76 in order to demonstrate clinically significant improvement in symptoms    Baseline  05/21/19: 66; 07/22/19: 69    Time  12    Period  Weeks    Status  Partially Met    Target Date  09/09/19      PT LONG TERM GOAL #6   Title  Pt will improve FGA by at least 4 points in order to demonstrate clinically significant improvement in balance    Baseline  07/22/19: 19/30;    Time  12    Period  Weeks    Status  On-going    Target Date  09/09/19            Plan - 08/12/19 1444    Clinical Impression Statement  Pt demonstrates excellent motivation during session. Continued with BLE strengthening as well as balance training today. Repeated resisted Matrix walking which patient finds challenging especially in the lateral direction. Performed wedding marches and thera band resisted walking which is also difficult for patient. Her outcome measures were updated during last session. She now has no further neck pain and her NDI has decreased to 2%. Her BERG improved from 49/56 to 54/56 and her ABC has also increased. Her FOTO score improved from 66 to 69. Performed FGA and she scored 19/30 which demonstrates continual deficits in balance. She will benefit from an additional 4 weeks of therapy at which  time she will be discharged to continue exercises independently. Pt will benefit from PT services to address deficits in strength, balance, and mobility in order to return to full function at home.    Personal Factors and Comorbidities  Age    Examination-Activity Limitations  Squat;Locomotion Level    Examination-Participation Restrictions  Community Activity;Driving    Stability/Clinical Decision Making  Evolving/Moderate complexity    Rehab Potential  Excellent    PT Frequency  2x / week    PT Duration  12 weeks    PT Treatment/Interventions  ADLs/Self Care Home Management;Aquatic Therapy;Biofeedback;Canalith Repostioning;Cryotherapy;Electrical Stimulation;Iontophoresis 75m/ml Dexamethasone;Moist Heat;Traction;Ultrasound;DME Instruction;Gait training;Stair training;Functional mobility training;Therapeutic activities;Therapeutic exercise;Balance training;Neuromuscular re-education;Patient/family education;Manual techniques;Passive range of motion;Dry needling;Vestibular;Joint Manipulations;Spinal Manipulations    PT Next Visit Plan  Update outcome measures, goals, and progress note; Interventions for neck pain, Progress balance and LE strengthening especially LLE, review HEP    PT Home Exercise Plan  Medbridge Access Code: [email or web address]    Consulted and Agree with Plan of Care  Patient       Patient will benefit from skilled therapeutic intervention in order to improve the following deficits and impairments:  Decreased balance, Decreased strength, Pain  Visit Diagnosis: Unsteadiness on feet  Muscle weakness (generalized)     Problem List Patient Active Problem List   Diagnosis Date Noted  . Essential hypertension   . Hyperlipidemia   . Hypothyroidism   . Stage 3a chronic kidney disease   . CV[place] (cerebral vascular accident) ([place] 05/12/2019  . Acute cardioembolic stroke ([place] 051/88/4166  JLyndel Safe PT, DPT, GCS  , 08/12/2019, 3:06 PM  [place] MAIN [facility] SERVICES [address] [place], [place], [address] Phone: [phone]   Fax:  [phone]  Name: Jenna [name] MRN: [id number] Date of Birth: 23-Feb-1941

## 2019-08-20 ENCOUNTER — Ambulatory Visit: Payer: Medicare HMO

## 2019-08-20 ENCOUNTER — Other Ambulatory Visit: Payer: Self-pay

## 2019-08-20 DIAGNOSIS — R2681 Unsteadiness on feet: Secondary | ICD-10-CM | POA: Diagnosis not present

## 2019-08-20 DIAGNOSIS — M6281 Muscle weakness (generalized): Secondary | ICD-10-CM

## 2019-08-20 NOTE — Therapy (Signed)
[place] MAIN [facility] SERVICES [address] [place], [place], [address] Phone: [phone]   Fax:  [phone]  Physical Therapy Treatment  Patient Details  Name: Jenna [name] MRN: [id number] Date of Birth: Apr 03, 1941 Referring Provider (PT): Jenna [name]   Encounter Date: [name]  PT End of Session - 08/20/19 1346    Visit Number  12    Number of Visits  33    Date for PT Re-Evaluation  09/09/19    Authorization Type  eval: 05/21/19    PT Start Time  1347    PT Stop Time  1430    PT Time Calculation (min)  43 min    Equipment Utilized During Treatment  Gait belt    Activity Tolerance  Patient tolerated treatment well    Behavior During Therapy  [facility] for tasks assessed/performed       Past Medical History:  Diagnosis Date  . Gout   . Hyperlipidemia   . Hypertension   . Thyroid disease     Past Surgical History:  Procedure Laterality Date  . BREAST BIOPSY Right 1978   neg  . CHOLECYSTECTOMY      There were no vitals filed for this visit.  Subjective Assessment - 08/20/19 1353    Subjective  Patient reports she is doing well today. She denies any new onset pain upon arrival today. No stumbles or LOB since last session. No specific questions or concerns currently    Pertinent History  Pt started with R sided visual issues, slurred speech, and L sided headache on 05/12/19. Her family brought her over to the hospital and she was given tPA by neurology. She was diagnosed with acute cardioembolic strokes with symptoms of left-sided headache, speech and word finding abnormalities, and right-sided visual issues. Patient was admitted as a code stroke by the critical care team. MRI showed multiple acute infarctions consistent with embolic disease. The most extensive areas the left temporoparietal junction.  Patient also had a punctate acute infarction in the right parietal lobe.  No hemorrhage.  Of note also an 11 mm meningioma was seen.   No arrhythmias on telemetry, carotid ultrasound negative, echocardiogram bubble study negative.  Case discussed with neurology and cardiology.  Patient was discharged home on aspirin and Crestor.  She reports that she had her follow-up appointment with her PCP. She did not have have [place] PT but was referred directly for OP PT.  Pt reports that her headache and word finding issues have resolved. Her vision is improving but she still feels like both eyes are a little more blurry. She is currently wearing a 14 day heart monitor.    Diagnostic tests  MRI: see history    Patient Stated Goals  Decrease neck pain and improve leg strength (especially LLE)    Currently in Pain?  No/denies        TREATMENT   Ther-ex  NuStep L2 x 5 minutes for warm-up during history (4 minutes unbilled); Precor BLE leg press 70# 2 x 20; Precor BLE heel raises 55# 2 x 20; Matrix resisted gait 17.5# forward/backward/R lateral/L lateral x 2 each direction with CGA; Sit to stand from regular height chair without UE support, Airex under feet, with 3kg overhead medicine ball press x 10; Resisted side stepping with red tband around ankles x 15' each direction;   Standing with 4# ankle weights: HS curls x 20 bilateral; Marching x 20 bilateral; Hip abduction x 20 bilateral; Hip extension x 20  bilateral;  Seated with 4# ankle weights: LAQ x 20 bilateral;   Pt educated throughout session about proper posture and technique with exercises. Improved exercise technique, movement at target joints, use of target muscles after min to mod verbal, visual, tactile cues.   Pt demonstrates excellent motivation during session.Continued with BLE strengthening and performed leg press after a long period without performing. Repeated resisted Matrix walking and increased resistance which patient finds challenging. Continued with standing and seated resistance exercises. She will have two more therapy session after which she will be  discharged to continue independently.Pt will benefit from PT services to address deficits in strength, balance, and mobility in order to return to full function at home.     PT Short Term Goals - 08/12/19 1448      PT SHORT TERM GOAL #1   Title  Pt will be independent with HEP in order to improve strength and balance as well as decrease neck pain in order to decrease fall risk and improve pain-free function at home.    Baseline  07/22/19: Performing at least 3 days/week    Time  6    Period  Weeks    Status  Partially Met    Target Date  07/02/19        PT Long Term Goals - 08/12/19 1448      PT LONG TERM GOAL #1   Title  Pt will improve BERG by at least 3 points in order to demonstrate clinically significant improvement in balance.    Baseline  05/21/19: 49/56; 07/22/19: 54/56;    Time  12    Period  Weeks    Status  Achieved      PT LONG TERM GOAL #2   Title  Pt will improve ABC by at least 13% in order to demonstrate clinically significant improvement in balance confidence.    Baseline  05/21/19: 53.1%; 07/22/19: 65%    Time  12    Period  Weeks    Status  Partially Met    Target Date  09/09/19      PT LONG TERM GOAL #3   Title  Pt will decrease worst neck pain as reported on NPRS by at least 2 points in order to demonstrate clinically significant reduction in pain.    Baseline  05/21/19: Worst: 9/10; 07/22/19: Neck pain has been resolved for multiple weeks without recurrence;    Time  12    Period  Weeks    Status  Achieved      PT LONG TERM GOAL #4   Title  Pt will demonstrate decrease in NDI by at least 19% in order to demonstrate clinically significant reduction in disability related to neck injury/pain.    Baseline  05/21/19: 26%; 07/22/19: 2%    Time  12    Period  Weeks    Status  Achieved      PT LONG TERM GOAL #5   Title  Pt will increase FOTO score to at least 76 in order to demonstrate clinically significant improvement in symptoms    Baseline  05/21/19: 66;  07/22/19: 69    Time  12    Period  Weeks    Status  Partially Met    Target Date  09/09/19      PT LONG TERM GOAL #6   Title  Pt will improve FGA by at least 4 points in order to demonstrate clinically significant improvement in balance    Baseline  07/22/19: 19/30;  Time  12    Period  Weeks    Status  On-going    Target Date  09/09/19            Plan - 08/20/19 1354    Clinical Impression Statement  Pt demonstrates excellent motivation during session. Continued with BLE strengthening and performed leg press after a long period without performing. Repeated resisted Matrix walking and increased resistance which patient finds challenging. Continued with standing and seated resistance exercises. She will have two more therapy session after which she will be discharged to continue independently. Pt will benefit from PT services to address deficits in strength, balance, and mobility in order to return to full function at home.    Personal Factors and Comorbidities  Age    Examination-Activity Limitations  Squat;Locomotion Level    Examination-Participation Restrictions  Community Activity;Driving    Stability/Clinical Decision Making  Evolving/Moderate complexity    Rehab Potential  Excellent    PT Frequency  2x / week    PT Duration  12 weeks    PT Treatment/Interventions  ADLs/Self Care Home Management;Aquatic Therapy;Biofeedback;Canalith Repostioning;Cryotherapy;Electrical Stimulation;Iontophoresis 42m/ml Dexamethasone;Moist Heat;Traction;Ultrasound;DME Instruction;Gait training;Stair training;Functional mobility training;Therapeutic activities;Therapeutic exercise;Balance training;Neuromuscular re-education;Patient/family education;Manual techniques;Passive range of motion;Dry needling;Vestibular;Joint Manipulations;Spinal Manipulations    PT Next Visit Plan  Update outcome measures, goals, and progress note; Interventions for neck pain, Progress balance and LE strengthening  especially LLE, review HEP    PT Home Exercise Plan  Medbridge Access Code: [email or web address]    Consulted and Agree with Plan of Care  Patient       Patient will benefit from skilled therapeutic intervention in order to improve the following deficits and impairments:  Decreased balance, Decreased strength, Pain  Visit Diagnosis: Unsteadiness on feet  Muscle weakness (generalized)     Problem List Patient Active Problem List   Diagnosis Date Noted  . Essential hypertension   . Hyperlipidemia   . Hypothyroidism   . Stage 3a chronic kidney disease   . CVA (cerebral vascular accident) ([place] 05/12/2019  . Acute cardioembolic stroke ([place] 057/90/3833  JPhillips GroutPT, DPT, GCS  Jenna [name], Jenna [name]  [place] [facility] LtdSERVICES [address] [address]Phone: [phone]  Fax:  [phone] Name: Jenna HICKMONMRN: [id number] of Birth: [birth year]

## 2019-08-27 ENCOUNTER — Ambulatory Visit: Payer: Medicare HMO

## 2019-08-27 ENCOUNTER — Other Ambulatory Visit: Payer: Self-pay

## 2019-08-27 DIAGNOSIS — R2681 Unsteadiness on feet: Secondary | ICD-10-CM

## 2019-08-27 DIAGNOSIS — M6281 Muscle weakness (generalized): Secondary | ICD-10-CM

## 2019-08-27 NOTE — Therapy (Signed)
[place] MAIN [facility] SERVICES [address] [place], [place], [address] Phone: [phone]   Fax:  [phone]  Physical Therapy Treatment/Discharge  Patient Details  Name: Jenna [name] MRN: [id number] Date of Birth: 02/14/1941 Referring Provider (PT): Lamonte Sakai   Encounter Date: 08/27/2019  PT End of Session - 08/27/19 1611    Visit Number  13    Number of Visits  33    Date for PT Re-Evaluation  09/09/19    Authorization Type  eval: 05/21/19    PT Start Time  1608    PT Stop Time  1650    PT Time Calculation (min)  42 min    Equipment Utilized During Treatment  Gait belt    Activity Tolerance  Patient tolerated treatment well    Behavior During Therapy  [facility] for tasks assessed/performed       Past Medical History:  Diagnosis Date  . Gout   . Hyperlipidemia   . Hypertension   . Thyroid disease     Past Surgical History:  Procedure Laterality Date  . BREAST BIOPSY Right 1978   neg  . CHOLECYSTECTOMY      There were no vitals filed for this visit.  Subjective Assessment - 08/27/19 1606    Subjective  Patient reports she is doing well today. She denies any new onset pain upon arrival today. No stumbles or LOB since last session. No specific questions or concerns currently    Pertinent History  Pt started with R sided visual issues, slurred speech, and L sided headache on 05/12/19. Her family brought her over to the hospital and she was given tPA by neurology. She was diagnosed with acute cardioembolic strokes with symptoms of left-sided headache, speech and word finding abnormalities, and right-sided visual issues. Patient was admitted as a code stroke by the critical care team. MRI showed multiple acute infarctions consistent with embolic disease. The most extensive areas the left temporoparietal junction.  Patient also had a punctate acute infarction in the right parietal lobe.  No hemorrhage.  Of note also an 11 mm meningioma  was seen.  No arrhythmias on telemetry, carotid ultrasound negative, echocardiogram bubble study negative.  Case discussed with neurology and cardiology.  Patient was discharged home on aspirin and Crestor.  She reports that she had her follow-up appointment with her PCP. She did not have have [facility] but was referred directly for OP PT.  Pt reports that her headache and word finding issues have resolved. Her vision is improving but she still feels like both eyes are a little more blurry. She is currently wearing a 14 day heart monitor.    Diagnostic tests  MRI: see history    Patient Stated Goals  Decrease neck pain and improve leg strength (especially LLE)    Currently in Pain?  No/denies          TREATMENT  Ther-ex  Octane xRide L3 x 5 minutes for warm-up during history; Updated outcome measures/goals with patient (see below); Seated clams with green tband resistance x 10 BLE; Seated marches with green tband resistance x 10 BLE; Standing hip abduction with green tband x 10 BLE; Sit to stand without UE support x 10; Reviewed HEP with patient;   FUNCTIONAL OUTCOME MEASURES    05/21/19 07/22/19 08/27/19 Comments  BERG _0 Fall risk, in need of intervention  NDI 26% 2% Not Tested Mild disability  ABC Scale 53.1% 65% 63.125% Low balance confidence  FOTO  66 69  58 Goal is 76  FGA  19/30 20/30 Fall risk, in need of intervention     Neuromuscular Re-education  Tandem balance alternating forward LE x 30s each; Tandem gait in // bars 6' x 2; Single leg balance practice x 30s on each side;    Pt educated throughout session about proper posture and technique with exercises. Improved exercise technique, movement at target joints, use of target muscles after min to mod verbal, visual, tactile cues.    Updated outcome measures and goals with patient during session today. Her BERG has improved from 49/56 at initial evaluation to 55/56 today. Her neck pain is fully resolved  from the initial evaluation. Her ABC increased from 53.1% to 63.1% indicating improvement in balance confidence. Her FGA which was first performed on 07/22/19 improved from 19/30 to 20/30 today. At this time pt has obtained the maximum benefit from therapy and she will be discharged to continue HEP independently. Pt encouraged to obtain new order if symptoms worsen or she is unable to continue progressing independently.                    PT Education - 08/28/19 1315    Education Details  Discharge, HEP    Person(s) Educated  Patient    Methods  Explanation;Handout;Demonstration    Comprehension  Verbalized understanding;Returned demonstration       PT Short Term Goals - 08/27/19 1612      PT SHORT TERM GOAL #1   Title  Pt will be independent with HEP in order to improve strength and balance as well as decrease neck pain in order to decrease fall risk and improve pain-free function at home.    Baseline  07/22/19: Performing at least 3 days/week; 08/27/19: Performing 6d/wk    Time  6    Period  Weeks    Status  Achieved    Target Date  --        PT Long Term Goals - 08/27/19 1613      PT LONG TERM GOAL #1   Title  Pt will improve BERG by at least 3 points in order to demonstrate clinically significant improvement in balance.    Baseline  05/21/19: 49/56; 07/22/19: 54/56; 08/27/19:    Time  12    Period  Weeks    Status  Achieved      PT LONG TERM GOAL #2   Title  Pt will improve ABC by at least 13% in order to demonstrate clinically significant improvement in balance confidence.    Baseline  05/21/19: 53.1%; 07/22/19: 65%, 08/27/19: 63.125%    Time  12    Period  Weeks    Status  Partially Met      PT LONG TERM GOAL #3   Title  Pt will decrease worst neck pain as reported on NPRS by at least 2 points in order to demonstrate clinically significant reduction in pain.    Baseline  05/21/19: Worst: 9/10; 07/22/19: Neck pain has been resolved for multiple weeks without  recurrence;    Time  12    Period  Weeks    Status  Achieved      PT LONG TERM GOAL #4   Title  Pt will demonstrate decrease in NDI by at least 19% in order to demonstrate clinically significant reduction in disability related to neck injury/pain.    Baseline  05/21/19: 26%; 07/22/19: 2%    Time  12    Period  Weeks    Status  Achieved      PT LONG TERM GOAL #5   Title  Pt will increase FOTO score to at least 76 in order to demonstrate clinically significant improvement in symptoms    Baseline  05/21/19: 66; 07/22/19: 69; 08/28/19: 58    Time  12    Period  Weeks    Status  Not Met      PT LONG TERM GOAL #6   Title  Pt will improve FGA by at least 4 points in order to demonstrate clinically significant improvement in balance    Baseline  07/22/19: 19/30; 08/28/19: 20/30    Time  12    Period  Weeks    Status  Partially Met            Plan - 08/27/19 1611    Clinical Impression Statement  Updated outcome measures and goals with patient during session today. Her BERG has improved from 49/56 at initial evaluation to 55/56 today. Her neck pain is fully resolved from the initial evaluation. Her ABC increased from 53.1% to 63.1% indicating improvement in balance confidence. Her FGA which was first performed on 07/22/19 improved from 19/30 to 20/30 today. At this time pt has obtained the maximum benefit from therapy and she will be discharged to continue HEP independently. Pt encouraged to obtain new order if symptoms worsen or she is unable to continue progressing independently.    Personal Factors and Comorbidities  Age    Examination-Activity Limitations  Squat;Locomotion Level    Examination-Participation Restrictions  Community Activity;Driving    Stability/Clinical Decision Making  Evolving/Moderate complexity    Rehab Potential  Excellent    PT Frequency  2x / week    PT Duration  12 weeks    PT Treatment/Interventions  ADLs/Self Care Home Management;Aquatic  Therapy;Biofeedback;Canalith Repostioning;Cryotherapy;Electrical Stimulation;Iontophoresis 25m/ml Dexamethasone;Moist Heat;Traction;Ultrasound;DME Instruction;Gait training;Stair training;Functional mobility training;Therapeutic activities;Therapeutic exercise;Balance training;Neuromuscular re-education;Patient/family education;Manual techniques;Passive range of motion;Dry needling;Vestibular;Joint Manipulations;Spinal Manipulations    PT Next Visit Plan  Discharge    PT Home Exercise Plan  Medbridge Access Code: [email or web address]    Consulted and Agree with Plan of Care  Patient       Patient will benefit from skilled therapeutic intervention in order to improve the following deficits and impairments:  Decreased balance, Decreased strength, Pain  Visit Diagnosis: Muscle weakness (generalized)  Unsteadiness on feet     Problem List Patient Active Problem List   Diagnosis Date Noted  . Essential hypertension   . Hyperlipidemia   . Hypothyroidism   . Stage 3a chronic kidney disease   . CV[place] (cerebral vascular accident) ([place] 05/12/[place]  . Acute cardioembolic stroke ([facility] [id number]  JPhillips GroutPT, DPT, GCS  Nivedita Mirabella 08/28/2019, 1:31 PM  [facility]MAIN [facility] [address] [address]Phone: [phone]  Fax:  [phone] Name: Jenna SILBERMANMRN: [id number] of Birth: 106-26-1942

## 2019-08-27 NOTE — Patient Instructions (Signed)
Access Code: [email or web address] URL: [email or web address] Date: 07/22/2019 Prepared by: Roxana Hires  Exercises Seated Knee Lifts with Resistance - 1 x daily - 7 x weekly - 10 reps - 2 sets - 3s hold Seated Hip Abduction with Resistance - 1 x daily - 7 x weekly - 10 reps - 2 sets - 3s hold Hip Abduction with Resistance Loop - 1 x daily - 7 x weekly - 2 sets - 10 reps - 3s hold Sit to Stand without Arm Support - 1 x daily - 7 x weekly - 10 reps - 2 sets Standing Tandem Balance with Unilateral Counter Support - 1 x daily - 7 x weekly - 3 x 30s with each foot forward hold Tandem Walking Next to Counter - 1 x daily - 7 x weekly - 30s x 3 hold Standing Single Leg Stance with Unilateral Counter Support - 1 x daily - 7 x weekly - 30s x 3 on each foot hold

## 2019-09-03 ENCOUNTER — Ambulatory Visit: Payer: Medicare HMO

## 2019-10-24 ENCOUNTER — Inpatient Hospital Stay
Admission: RE | Admit: 2019-10-24 | Discharge: 2019-10-24 | Disposition: A | Discharge status: Home / Self Care | Payer: Self-pay | Source: Ambulatory Visit | Attending: Otolaryngology | Admitting: Otolaryngology

## 2019-10-24 ENCOUNTER — Other Ambulatory Visit: Payer: Self-pay | Admitting: Otolaryngology

## 2019-10-24 DIAGNOSIS — R221 Localized swelling, mass and lump, neck: Secondary | ICD-10-CM

## 2019-10-25 ENCOUNTER — Other Ambulatory Visit: Payer: Self-pay | Admitting: *Deleted

## 2019-10-25 ENCOUNTER — Other Ambulatory Visit: Payer: Self-pay | Admitting: Otolaryngology

## 2019-10-25 DIAGNOSIS — R221 Localized swelling, mass and lump, neck: Secondary | ICD-10-CM

## 2019-10-28 DIAGNOSIS — R809 Proteinuria, unspecified: Secondary | ICD-10-CM | POA: Insufficient documentation

## 2019-10-30 NOTE — Progress Notes (Signed)
Patient on schedule for Parotid biopsy 11/01/2019, spoke with patient on phone with pre procedure instructions given with made aware to be here @ 0930, NPO after MN prior to procedure, and driver for discharge post procedure if sedation required. Stated understanding.

## 2019-11-01 ENCOUNTER — Other Ambulatory Visit: Payer: Self-pay | Admitting: Otolaryngology

## 2019-11-01 ENCOUNTER — Other Ambulatory Visit: Payer: Self-pay

## 2019-11-01 ENCOUNTER — Ambulatory Visit
Admission: RE | Admit: 2019-11-01 | Discharge: 2019-11-01 | Disposition: A | Discharge status: Home / Self Care | Payer: Medicare HMO | Source: Ambulatory Visit | Attending: Otolaryngology | Admitting: Otolaryngology

## 2019-11-01 DIAGNOSIS — R221 Localized swelling, mass and lump, neck: Secondary | ICD-10-CM | POA: Diagnosis not present

## 2019-11-01 DIAGNOSIS — K118 Other diseases of salivary glands: Secondary | ICD-10-CM | POA: Insufficient documentation

## 2019-11-01 NOTE — Procedures (Signed)
Interventional Radiology Procedure Note  Procedure: US Guided Biopsy of left parotid lesion  Complications: None  Estimated Blood Loss: < 10 mL  Findings: 25 G FNA of left parotid lesion performed under US guidance.  Three FNA samples obtained.  Venetia Night. Kathlene Cote, M.D Pager:  [phone]

## 2019-11-05 LAB — CYTOLOGY - NON PAP

## 2019-12-31 DIAGNOSIS — M109 Gout, unspecified: Secondary | ICD-10-CM | POA: Insufficient documentation

## 2020-01-28 DIAGNOSIS — F17200 Nicotine dependence, unspecified, uncomplicated: Secondary | ICD-10-CM | POA: Insufficient documentation

## 2020-02-04 DIAGNOSIS — D119 Benign neoplasm of major salivary gland, unspecified: Secondary | ICD-10-CM | POA: Insufficient documentation

## 2020-02-17 ENCOUNTER — Ambulatory Visit (LOCAL_COMMUNITY_HEALTH_CENTER): Payer: Medicare HMO

## 2020-02-17 ENCOUNTER — Other Ambulatory Visit: Payer: Self-pay

## 2020-02-17 DIAGNOSIS — Z23 Encounter for immunization: Secondary | ICD-10-CM

## 2020-02-17 NOTE — Progress Notes (Signed)
Added historical Prevnar 13 vaccine from Epic to [place].

## 2020-03-04 DIAGNOSIS — Z1371 Encounter for nonprocreative screening for genetic disease carrier status: Secondary | ICD-10-CM

## 2020-03-04 HISTORY — DX: Encounter for nonprocreative screening for genetic disease carrier status: Z13.71

## 2020-03-06 ENCOUNTER — Ambulatory Visit: Payer: Medicare PPO | Admitting: Advanced Practice Midwife

## 2020-03-10 ENCOUNTER — Ambulatory Visit: Payer: Medicare PPO | Admitting: Advanced Practice Midwife

## 2020-03-12 ENCOUNTER — Other Ambulatory Visit (HOSPITAL_COMMUNITY)
Admission: RE | Admit: 2020-03-12 | Discharge: 2020-03-12 | Disposition: A | Discharge status: Home / Self Care | Payer: Medicare HMO | Source: Ambulatory Visit | Attending: Advanced Practice Midwife | Admitting: Advanced Practice Midwife

## 2020-03-12 ENCOUNTER — Ambulatory Visit (INDEPENDENT_AMBULATORY_CARE_PROVIDER_SITE_OTHER): Payer: Medicare HMO | Admitting: Advanced Practice Midwife

## 2020-03-12 ENCOUNTER — Other Ambulatory Visit: Payer: Self-pay

## 2020-03-12 ENCOUNTER — Encounter: Payer: Self-pay | Admitting: Advanced Practice Midwife

## 2020-03-12 VITALS — BP 140/70 | Ht 64.0 in | Wt 172.0 lb

## 2020-03-12 DIAGNOSIS — Z720 Tobacco use: Secondary | ICD-10-CM

## 2020-03-12 DIAGNOSIS — Z1151 Encounter for screening for human papillomavirus (HPV): Secondary | ICD-10-CM | POA: Diagnosis not present

## 2020-03-12 DIAGNOSIS — Z01419 Encounter for gynecological examination (general) (routine) without abnormal findings: Secondary | ICD-10-CM | POA: Insufficient documentation

## 2020-03-12 DIAGNOSIS — R87612 Low grade squamous intraepithelial lesion on cytologic smear of cervix (LGSIL): Secondary | ICD-10-CM | POA: Insufficient documentation

## 2020-03-12 DIAGNOSIS — Z1272 Encounter for screening for malignant neoplasm of vagina: Secondary | ICD-10-CM | POA: Insufficient documentation

## 2020-03-12 MED ORDER — BUPROPION HCL ER (XL) 150 MG PO TB24: 150.0000 mg | ORAL_TABLET | Freq: Every day | ORAL | 5 refills | Status: DC

## 2020-03-12 NOTE — Patient Instructions (Signed)
Health Maintenance After Age 77 After age 77, you are at a higher risk for certain long-term diseases and infections as well as injuries from falls. Falls are a major cause of broken bones and head injuries in people who are older than age 77. Getting regular preventive care can help to keep you healthy and well. Preventive care includes getting regular testing and making lifestyle changes as recommended by your health care provider. Talk with your health care provider about:  Which screenings and tests you should have. A screening is a test that checks for a disease when you have no symptoms.  A diet and exercise plan that is right for you. What should I know about screenings and tests to prevent falls? Screening and testing are the best ways to find a health problem early. Early diagnosis and treatment give you the best chance of managing medical conditions that are common after age 77. Certain conditions and lifestyle choices may make you more likely to have a fall. Your health care provider may recommend:  Regular vision checks. Poor vision and conditions such as cataracts can make you more likely to have a fall. If you wear glasses, make sure to get your prescription updated if your vision changes.  Medicine review. Work with your health care provider to regularly review all of the medicines you are taking, including over-the-counter medicines. Ask your health care provider about any side effects that may make you more likely to have a fall. Tell your health care provider if any medicines that you take make you feel dizzy or sleepy.  Osteoporosis screening. Osteoporosis is a condition that causes the bones to get weaker. This can make the bones weak and cause them to break more easily.  Blood pressure screening. Blood pressure changes and medicines to control blood pressure can make you feel dizzy.  Strength and balance checks. Your health care provider may recommend certain tests to check your  strength and balance while standing, walking, or changing positions.  Foot health exam. Foot pain and numbness, as well as not wearing proper footwear, can make you more likely to have a fall.  Depression screening. You may be more likely to have a fall if you have a fear of falling, feel emotionally low, or feel unable to do activities that you used to do.  Alcohol use screening. Using too much alcohol can affect your balance and may make you more likely to have a fall. What actions can I take to lower my risk of falls? General instructions  Talk with your health care provider about your risks for falling. Tell your health care provider if: ? You fall. Be sure to tell your health care provider about all falls, even ones that seem minor. ? You feel dizzy, sleepy, or off-balance.  Take over-the-counter and prescription medicines only as told by your health care provider. These include any supplements.  Eat a healthy diet and maintain a healthy weight. A healthy diet includes low-fat dairy products, low-fat (lean) meats, and fiber from whole grains, beans, and lots of fruits and vegetables. Home safety  Remove any tripping hazards, such as rugs, cords, and clutter.  Install safety equipment such as grab bars in bathrooms and safety rails on stairs.  Keep rooms and walkways well-lit. Activity   Follow a regular exercise program to stay fit. This will help you maintain your balance. Ask your health care provider what types of exercise are appropriate for you.  If you need a cane or   walker, use it as recommended by your health care provider.  Wear supportive shoes that have nonskid soles. Lifestyle  Do not drink alcohol if your health care provider tells you not to drink.  If you drink alcohol, limit how much you have: ? 0-1 drink a day for women. ? 0-2 drinks a day for men.  Be aware of how much alcohol is in your drink. In the [place] one drink equals one typical bottle of beer (12  oz), one-half glass of wine (5 oz), or one shot of hard liquor (1 oz).  Do not use any products that contain nicotine or tobacco, such as cigarettes and e-cigarettes. If you need help quitting, ask your health care provider. Summary  Having a healthy lifestyle and getting preventive care can help to protect your health and wellness after age 77.  Screening and testing are the best way to find a health problem early and help you avoid having a fall. Early diagnosis and treatment give you the best chance for managing medical conditions that are more common for people who are older than age 77.  Falls are a major cause of broken bones and head injuries in people who are older than age 77. Take precautions to prevent a fall at home.  Work with your health care provider to learn what changes you can make to improve your health and wellness and to prevent falls. This information is not intended to replace advice given to you by your health care provider. Make sure you discuss any questions you have with your health care provider. Document Revised: 07/12/2018 Document Reviewed: 02/01/2017 Elsevier Patient Education  2020 [employer]  

## 2020-03-13 NOTE — Progress Notes (Addendum)
Gynecology Annual Exam  Date of Visit: 03/12/2020  PCP: Perrin Maltese, MD  Chief Complaint:  Chief Complaint  Patient presents with  . Gynecologic Exam    History of Present Illness:Patient is a 79 y.o. G2 P2 presents for annual exam. The patient has no gyn complaints today. She is here primarily for vaginal cancer screening due to previous abnormal PAP.   She had quit smoking a few years ago after smoking for many years. She restarted during the pandemic. She would like to quit and requests some medication assistance.  LMP: No LMP recorded. Patient is postmenopausal. Postcoital Bleeding: no  The patient is sexually active. She denies dyspareunia.  The patient does perform self breast exams.  There is no notable family history of breast or ovarian cancer in her family.  The patient wears seatbelts: yes.   The patient has regular exercise: she walks regularly.    The patient denies current symptoms of depression.     Review of Systems: Review of Systems  Constitutional: Negative for chills and fever.  HENT: Negative for congestion, ear discharge, ear pain, hearing loss, sinus pain and sore throat.   Eyes: Negative for blurred vision and double vision.  Respiratory: Negative for cough, shortness of breath and wheezing.   Cardiovascular: Negative for chest pain, palpitations and leg swelling.  Gastrointestinal: Negative for abdominal pain, blood in stool, constipation, diarrhea, heartburn, melena, nausea and vomiting.  Genitourinary: Positive for frequency. Negative for dysuria, flank pain, hematuria and urgency.  Musculoskeletal: Negative for back pain, joint pain and myalgias.  Skin: Negative for itching and rash.  Neurological: Negative for dizziness, tingling, tremors, sensory change, speech change, focal weakness, seizures, loss of consciousness, weakness and headaches.  Endo/Heme/Allergies: Negative for environmental allergies. Does not bruise/bleed easily.   Psychiatric/Behavioral: Negative for depression, hallucinations, memory loss, substance abuse and suicidal ideas. The patient is not nervous/anxious and does not have insomnia.     Past Medical History:  Patient Active Problem List   Diagnosis Date Noted  . [place] tumor 02/04/2020    Formatting of this note might be different from the original. Of left parotid. On biopsy at [facility] 11/05/19.  Last Assessment & Plan:  Formatting of this note might be different from the original. She states that an excisional biopsy has been advised and she is asking if she should get a second opinion.  She does not wish to get surgery if she does not need it. I advised that a second opinion is reasonable. She will let us know should she desire a referral for a second opinion.   . Smoking 01/28/2020    Last Assessment & Plan:  Formatting of this note might be different from the original. She is currently smoking 1/2ppd and has done so for 50 years.  She quit smoking back in 2018 with Chantix which she only took for a few days per week, but then started back smoking about 12 months ago at 6-8 cigarettes per day. She would like to quit again and is interested in Chantix.  Plan:  After discussion of risks, benefits, and side effects, prescribed Chantix. Would benefit from follow-up in one month from starting on the Chantix. To also use the resource of 1-800-QUIT-NOW   . Gout 12/31/2019    Last Assessment & Plan:  Formatting of this note is different from the original. Lab Results  Component Value Date   URICACID 4.8 01/28/2020   Uric acid at goal of <6 on  Allopurinol 200mg /day.  Plan: Continue on current dosage of medication.   . Proteinuria 10/28/2019  . Essential hypertension   . Hyperlipidemia   . Hypothyroidism   . Stage 3a chronic kidney disease ([place])   . CVA (cerebral vascular accident) ([place]) 05/12/2019  . Acute cardioembolic stroke ([place]) 11/15/4816    Past Surgical History:   Past Surgical History:  Procedure Laterality Date  . BREAST BIOPSY Right 1978   neg  . CHOLECYSTECTOMY      Gynecologic History:  No LMP recorded. Patient is postmenopausal. Last Pap: 2 years ago Results were:  ASCUS with NEGATIVE high risk HPV  Last mammogram: 1 year ago Results were: benign mass in left breast. 6 month follow up was recommended  Obstetric History: No obstetric history on file.  Family History:  Family History  Problem Relation Age of Onset  . Breast cancer Maternal Grandmother 56  . Breast cancer Cousin 66    Social History:  Social History   Socioeconomic History  . Marital status: Married    Spouse name: Not on file  . Number of children: Not on file  . Years of education: Not on file  . Highest education level: Not on file  Occupational History  . Not on file  Tobacco Use  . Smoking status: Current Every Day Smoker    Packs/day: 0.25    Types: Cigarettes  . Smokeless tobacco: Never Used  . Tobacco comment: smoking 4-5 cigarettes daily.   Vaping Use  . Vaping Use: Never used  Substance and Sexual Activity  . Alcohol use: No  . Drug use: No  . Sexual activity: Yes    Birth control/protection: Post-menopausal  Other Topics Concern  . Not on file  Social History Narrative  . Not on file   Social Determinants of Health   Financial Resource Strain: Not on file  Food Insecurity: Not on file  Transportation Needs: Not on file  Physical Activity: Not on file  Stress: Not on file  Social Connections: Not on file  Intimate Partner Violence: Not on file    Allergies:  Allergies  Allergen Reactions  . Nicotine Rash    Medications: Prior to Admission medications   Medication Sig Start Date End Date Taking? Authorizing Provider  allopurinol (ZYLOPRIM) 100 MG tablet Take 100 mg by mouth 2 (two) times daily. 04/03/19  Yes [provider]  aspirin EC 325 MG EC tablet Take 1 tablet (325 mg total) by mouth daily. 05/15/19  Yes  Wieting, Richard, MD  Calcium Carbonate-Vitamin D 600-200 MG-UNIT TABS Take 1 tablet by mouth 2 (two) times daily with a meal.   Yes [provider]  Cholecalciferol 25 MCG (1000 UT) tablet Take by mouth.   Yes [provider]  fenofibrate 54 MG tablet Take by mouth. 10/09/19  Yes [provider]  levothyroxine (SYNTHROID) 50 MCG tablet Take 50 mcg by mouth daily. 04/30/19  Yes [provider]  Multiple Vitamin (MULTIVITAMIN) tablet Take 1 tablet by mouth daily.   Yes [provider]  rosuvastatin (CRESTOR) 20 MG tablet Take by mouth. 09/26/14  Yes [provider]  buPROPion (WELLBUTRIN XL) 150 MG 24 hr tablet Take 1 tablet (150 mg total) by mouth daily. 03/12/20   Rod Can, CNM    Physical Exam Vitals: Blood pressure 140/70, height 5\' 4"  (1.626 m), weight 172 lb (78 kg).  General: NAD HEENT: normocephalic, anicteric Thyroid: no enlargement, no palpable nodules Pulmonary: No increased work of breathing, CTAB Cardiovascular: RRR,  distal pulses 2+ Breast: Breast symmetrical, no tenderness, no palpable nodules or masses, no skin or nipple retraction present, no nipple discharge.  No axillary or supraclavicular lymphadenopathy. Abdomen: NABS, soft, non-tender, non-distended.  Umbilicus without lesions.  No hepatomegaly, splenomegaly or masses palpable. No evidence of hernia  Genitourinary:  External: Normal external female genitalia.  Normal urethral meatus, normal Bartholin's and Skene's glands.    Vagina: Normal vaginal mucosa, no evidence of prolapse.    Cervix: Grossly normal in appearance, no bleeding  Uterus: Non-enlarged, mobile, normal contour.  No CMT  Adnexa: ovaries non-enlarged, no adnexal masses  Rectal: deferred  Lymphatic: no evidence of inguinal lymphadenopathy Extremities: no edema, erythema, or tenderness Neurologic: Grossly intact Psychiatric: mood appropriate, affect full    Assessment: 79 y.o. G2 P2, routine  annual exam  Plan: Problem List Items Addressed This Visit   None   Visit Diagnoses    Well woman exam with routine gynecological exam    -  Primary   Relevant Orders   Cytology - PAP   Screening for vaginal cancer       Relevant Orders   Cytology - PAP   Currently attempting to quit smoking       Relevant Medications   buPROPion (WELLBUTRIN XL) 150 MG 24 hr tablet      1) Mammogram - recommend yearly screening mammogram.  Mammogram is ordered per PCP  2) STI screening  wasoffered and declined  3) ASCCP guidelines and rational discussed.  Patient opts for yearly screening interval until normal PAP results  4) Osteoporosis: ordered per PCP - per USPTF routine screening DEXA at age 62  Consider FDA-approved medical therapies in postmenopausal women and men aged 73 years and older, based on the following: a) A hip or vertebral (clinical or morphometric) fracture b) T-score ? -2.5 at the femoral neck or spine after appropriate evaluation to exclude secondary causes C) Low bone mass (T-score between -1.0 and -2.5 at the femoral neck or spine) and a 10-year probability of a hip fracture ? 3% or a 10-year probability of a major osteoporosis-related fracture ? 20% based on the [place]-adapted WHO algorithm   5) Routine healthcare maintenance including cholesterol, diabetes screening discussed managed by PCP  6) Colonoscopy: per PCP.  Screening recommended starting at age 33 for average risk individuals, age 66 for individuals deemed at increased risk (including African Americans) and recommended to continue until age 74.  For patient age 28-85 individualized approach is recommended.  Gold standard screening is via colonoscopy, Cologuard screening is an acceptable alternative for patient unwilling or unable to undergo colonoscopy.  "Colorectal cancer screening for average?risk adults: 2018 guideline update from the [employer]: A Cancer Journal for Clinicians: Aug 31, 2016   8)  Wellbutrin ordered for short term use to assist with smoking cessation  7) Return in about 1 year (around 03/12/2021) for annual established gyn.   Christean Leaf, CNM [facility] 03/13/20, 10:21 AM

## 2020-03-17 LAB — CYTOLOGY - PAP
Comment: NEGATIVE
High risk HPV: NEGATIVE

## 2020-03-19 ENCOUNTER — Other Ambulatory Visit: Payer: Self-pay | Admitting: Family

## 2020-03-19 DIAGNOSIS — Z1231 Encounter for screening mammogram for malignant neoplasm of breast: Secondary | ICD-10-CM

## 2020-03-23 ENCOUNTER — Other Ambulatory Visit: Payer: Self-pay | Admitting: Family

## 2020-03-23 DIAGNOSIS — N6320 Unspecified lump in the left breast, unspecified quadrant: Secondary | ICD-10-CM

## 2020-03-24 ENCOUNTER — Encounter: Payer: Self-pay | Admitting: Obstetrics and Gynecology

## 2020-04-04 ENCOUNTER — Other Ambulatory Visit: Payer: Self-pay | Admitting: Advanced Practice Midwife

## 2020-04-04 DIAGNOSIS — Z720 Tobacco use: Secondary | ICD-10-CM

## 2020-04-16 ENCOUNTER — Other Ambulatory Visit: Payer: Self-pay | Admitting: Obstetrics and Gynecology

## 2020-04-16 DIAGNOSIS — Z803 Family history of malignant neoplasm of breast: Secondary | ICD-10-CM

## 2020-04-16 NOTE — Progress Notes (Signed)
MyRisk order placed, done with JEG 12/21

## 2020-04-17 ENCOUNTER — Other Ambulatory Visit: Payer: Self-pay

## 2020-04-17 ENCOUNTER — Ambulatory Visit
Admission: RE | Admit: 2020-04-17 | Discharge: 2020-04-17 | Disposition: A | Discharge status: Home / Self Care | Payer: Medicare HMO | Source: Ambulatory Visit | Attending: Family | Admitting: Family

## 2020-04-17 DIAGNOSIS — N6322 Unspecified lump in the left breast, upper inner quadrant: Secondary | ICD-10-CM | POA: Diagnosis not present

## 2020-04-17 DIAGNOSIS — N6320 Unspecified lump in the left breast, unspecified quadrant: Secondary | ICD-10-CM | POA: Diagnosis present

## 2020-10-13 DIAGNOSIS — E039 Hypothyroidism, unspecified: Secondary | ICD-10-CM | POA: Diagnosis not present

## 2020-10-13 DIAGNOSIS — E559 Vitamin D deficiency, unspecified: Secondary | ICD-10-CM | POA: Diagnosis not present

## 2020-10-13 DIAGNOSIS — E782 Mixed hyperlipidemia: Secondary | ICD-10-CM | POA: Diagnosis not present

## 2020-10-13 DIAGNOSIS — I10 Essential (primary) hypertension: Secondary | ICD-10-CM | POA: Diagnosis not present

## 2020-10-19 DIAGNOSIS — I10 Essential (primary) hypertension: Secondary | ICD-10-CM | POA: Diagnosis not present

## 2020-10-19 DIAGNOSIS — M1009 Idiopathic gout, multiple sites: Secondary | ICD-10-CM | POA: Diagnosis not present

## 2020-10-19 DIAGNOSIS — E782 Mixed hyperlipidemia: Secondary | ICD-10-CM | POA: Diagnosis not present

## 2020-10-19 DIAGNOSIS — E039 Hypothyroidism, unspecified: Secondary | ICD-10-CM | POA: Diagnosis not present

## 2020-10-19 DIAGNOSIS — R7303 Prediabetes: Secondary | ICD-10-CM | POA: Diagnosis not present

## 2020-11-02 DIAGNOSIS — D110 Benign neoplasm of parotid gland: Secondary | ICD-10-CM | POA: Diagnosis not present

## 2020-11-14 ENCOUNTER — Inpatient Hospital Stay
Admission: EM | Admit: 2020-11-14 | Discharge: 2020-11-20 | DRG: 329 | Disposition: A | Discharge status: Home Health | Payer: Medicare HMO | Attending: General Surgery | Admitting: General Surgery

## 2020-11-14 ENCOUNTER — Other Ambulatory Visit: Payer: Self-pay

## 2020-11-14 ENCOUNTER — Emergency Department: Payer: Medicare HMO

## 2020-11-14 DIAGNOSIS — M109 Gout, unspecified: Secondary | ICD-10-CM | POA: Diagnosis present

## 2020-11-14 DIAGNOSIS — K6289 Other specified diseases of anus and rectum: Secondary | ICD-10-CM

## 2020-11-14 DIAGNOSIS — K6389 Other specified diseases of intestine: Secondary | ICD-10-CM | POA: Diagnosis not present

## 2020-11-14 DIAGNOSIS — Z20822 Contact with and (suspected) exposure to covid-19: Secondary | ICD-10-CM | POA: Diagnosis present

## 2020-11-14 DIAGNOSIS — K56609 Unspecified intestinal obstruction, unspecified as to partial versus complete obstruction: Secondary | ICD-10-CM

## 2020-11-14 DIAGNOSIS — C186 Malignant neoplasm of descending colon: Principal | ICD-10-CM | POA: Diagnosis present

## 2020-11-14 DIAGNOSIS — I129 Hypertensive chronic kidney disease with stage 1 through stage 4 chronic kidney disease, or unspecified chronic kidney disease: Secondary | ICD-10-CM | POA: Diagnosis present

## 2020-11-14 DIAGNOSIS — K5732 Diverticulitis of large intestine without perforation or abscess without bleeding: Secondary | ICD-10-CM | POA: Diagnosis present

## 2020-11-14 DIAGNOSIS — E039 Hypothyroidism, unspecified: Secondary | ICD-10-CM | POA: Diagnosis present

## 2020-11-14 DIAGNOSIS — F419 Anxiety disorder, unspecified: Secondary | ICD-10-CM | POA: Diagnosis present

## 2020-11-14 DIAGNOSIS — F17210 Nicotine dependence, cigarettes, uncomplicated: Secondary | ICD-10-CM | POA: Diagnosis present

## 2020-11-14 DIAGNOSIS — R059 Cough, unspecified: Secondary | ICD-10-CM | POA: Diagnosis not present

## 2020-11-14 DIAGNOSIS — I10 Essential (primary) hypertension: Secondary | ICD-10-CM | POA: Diagnosis not present

## 2020-11-14 DIAGNOSIS — R1030 Lower abdominal pain, unspecified: Secondary | ICD-10-CM

## 2020-11-14 DIAGNOSIS — Z79890 Hormone replacement therapy (postmenopausal): Secondary | ICD-10-CM | POA: Diagnosis not present

## 2020-11-14 DIAGNOSIS — I639 Cerebral infarction, unspecified: Secondary | ICD-10-CM | POA: Diagnosis present

## 2020-11-14 DIAGNOSIS — E871 Hypo-osmolality and hyponatremia: Secondary | ICD-10-CM | POA: Diagnosis present

## 2020-11-14 DIAGNOSIS — Z803 Family history of malignant neoplasm of breast: Secondary | ICD-10-CM

## 2020-11-14 DIAGNOSIS — E782 Mixed hyperlipidemia: Secondary | ICD-10-CM | POA: Diagnosis not present

## 2020-11-14 DIAGNOSIS — N1831 Chronic kidney disease, stage 3a: Secondary | ICD-10-CM | POA: Diagnosis present

## 2020-11-14 DIAGNOSIS — K659 Peritonitis, unspecified: Secondary | ICD-10-CM | POA: Diagnosis present

## 2020-11-14 DIAGNOSIS — K631 Perforation of intestine (nontraumatic): Secondary | ICD-10-CM | POA: Diagnosis not present

## 2020-11-14 DIAGNOSIS — K5792 Diverticulitis of intestine, part unspecified, without perforation or abscess without bleeding: Secondary | ICD-10-CM | POA: Diagnosis present

## 2020-11-14 DIAGNOSIS — K5939 Other megacolon: Secondary | ICD-10-CM | POA: Diagnosis not present

## 2020-11-14 DIAGNOSIS — R1084 Generalized abdominal pain: Secondary | ICD-10-CM | POA: Diagnosis not present

## 2020-11-14 DIAGNOSIS — N179 Acute kidney failure, unspecified: Secondary | ICD-10-CM | POA: Diagnosis present

## 2020-11-14 DIAGNOSIS — Z86011 Personal history of benign neoplasm of the brain: Secondary | ICD-10-CM | POA: Diagnosis not present

## 2020-11-14 DIAGNOSIS — F32A Depression, unspecified: Secondary | ICD-10-CM | POA: Diagnosis present

## 2020-11-14 DIAGNOSIS — R109 Unspecified abdominal pain: Secondary | ICD-10-CM | POA: Diagnosis present

## 2020-11-14 DIAGNOSIS — Z79899 Other long term (current) drug therapy: Secondary | ICD-10-CM | POA: Diagnosis not present

## 2020-11-14 DIAGNOSIS — Z7982 Long term (current) use of aspirin: Secondary | ICD-10-CM | POA: Diagnosis not present

## 2020-11-14 DIAGNOSIS — T8131XA Disruption of external operation (surgical) wound, not elsewhere classified, initial encounter: Secondary | ICD-10-CM | POA: Diagnosis not present

## 2020-11-14 DIAGNOSIS — I959 Hypotension, unspecified: Secondary | ICD-10-CM | POA: Diagnosis present

## 2020-11-14 DIAGNOSIS — E785 Hyperlipidemia, unspecified: Secondary | ICD-10-CM | POA: Diagnosis present

## 2020-11-14 DIAGNOSIS — K5900 Constipation, unspecified: Secondary | ICD-10-CM | POA: Diagnosis present

## 2020-11-14 DIAGNOSIS — Z8673 Personal history of transient ischemic attack (TIA), and cerebral infarction without residual deficits: Secondary | ICD-10-CM

## 2020-11-14 LAB — COMPREHENSIVE METABOLIC PANEL
ALT: 9 U/L (ref 0–44)
AST: 16 U/L (ref 15–41)
Albumin: 4.2 g/dL (ref 3.5–5.0)
Alkaline Phosphatase: 50 U/L (ref 38–126)
Anion gap: 11 (ref 5–15)
BUN: 17 mg/dL (ref 8–23)
CO2: 22 mmol/L (ref 22–32)
Calcium: 9.3 mg/dL (ref 8.9–10.3)
Chloride: 105 mmol/L (ref 98–111)
Creatinine, Ser: 0.98 mg/dL (ref 0.44–1.00)
GFR, Estimated: 59 mL/min — ABNORMAL LOW (ref >60)
Glucose, Bld: 135 mg/dL — ABNORMAL HIGH (ref 70–99)
Potassium: 3.9 mmol/L (ref 3.5–5.1)
Sodium: 138 mmol/L (ref 135–145)
Total Bilirubin: 0.9 mg/dL (ref 0.3–1.2)
Total Protein: 7.5 g/dL (ref 6.5–8.1)

## 2020-11-14 LAB — URINALYSIS, COMPLETE (UACMP) WITH MICROSCOPIC
Bilirubin Urine: NEGATIVE
Bilirubin Urine: NEGATIVE
Glucose, UA: NEGATIVE mg/dL
Glucose, UA: NEGATIVE mg/dL
Hgb urine dipstick: NEGATIVE
Hgb urine dipstick: NEGATIVE
Ketones, ur: NEGATIVE mg/dL
Ketones, ur: NEGATIVE mg/dL
Nitrite: NEGATIVE
Nitrite: NEGATIVE
Protein, ur: NEGATIVE mg/dL
Protein, ur: NEGATIVE mg/dL
Specific Gravity, Urine: 1.013 (ref 1.005–1.030)
Specific Gravity, Urine: 1.027 (ref 1.005–1.030)
pH: 6 (ref 5.0–8.0)
pH: 6 (ref 5.0–8.0)

## 2020-11-14 LAB — RESP PANEL BY RT-PCR (FLU A&B, COVID) ARPGX2
Influenza A by PCR: NEGATIVE
Influenza B by PCR: NEGATIVE
SARS Coronavirus 2 by RT PCR: NEGATIVE

## 2020-11-14 LAB — CBC
HCT: 38.4 % (ref 36.0–46.0)
Hemoglobin: 13 g/dL (ref 12.0–15.0)
MCH: 30.7 pg (ref 26.0–34.0)
MCHC: 33.9 g/dL (ref 30.0–36.0)
MCV: 90.8 fL (ref 80.0–100.0)
Platelets: 254 10*3/uL (ref 150–400)
RBC: 4.23 MIL/uL (ref 3.87–5.11)
RDW: 13.5 % (ref 11.5–15.5)
WBC: 9.1 10*3/uL (ref 4.0–10.5)
nRBC: 0 % (ref 0.0–0.2)

## 2020-11-14 LAB — LIPASE, BLOOD: Lipase: 28 U/L (ref 11–51)

## 2020-11-14 LAB — TROPONIN I (HIGH SENSITIVITY)
Troponin I (High Sensitivity): 9 ng/L (ref <18)
Troponin I (High Sensitivity): 9 ng/L (ref <18)

## 2020-11-14 MED ORDER — ONDANSETRON HCL 4 MG PO TABS
4.0000 mg | ORAL_TABLET | Freq: Four times a day (QID) | ORAL | Status: DC | PRN
Start: 2020-11-14 — End: 2020-11-20

## 2020-11-14 MED ORDER — MORPHINE SULFATE (PF) 4 MG/ML IV SOLN
4.0000 mg | Freq: Once | INTRAVENOUS | Status: AC
Start: 2020-11-14 — End: 2020-11-14
  Administered 2020-11-14: 4 mg via INTRAVENOUS
  Filled 2020-11-14: qty 1

## 2020-11-14 MED ORDER — FENTANYL CITRATE (PF) 100 MCG/2ML IJ SOLN: 50.0000 ug | Freq: Once | INTRAMUSCULAR | Status: DC

## 2020-11-14 MED ORDER — ASPIRIN EC 81 MG PO TBEC: 81.0000 mg | DELAYED_RELEASE_TABLET | Freq: Every evening | ORAL | Status: DC

## 2020-11-14 MED ORDER — HEPARIN SODIUM (PORCINE) 5000 UNIT/ML IJ SOLN
5000.0000 [IU] | Freq: Three times a day (TID) | INTRAMUSCULAR | Status: DC
Start: 2020-11-14 — End: 2020-11-16
  Administered 2020-11-14 – 2020-11-16 (×6): 5000 [IU] via SUBCUTANEOUS
  Filled 2020-11-14 (×7): qty 1

## 2020-11-14 MED ORDER — MORPHINE SULFATE (PF) 2 MG/ML IV SOLN
2.0000 mg | INTRAVENOUS | Status: DC | PRN
  Administered 2020-11-14 – 2020-11-16 (×4): 2 mg via INTRAVENOUS
  Filled 2020-11-14 (×4): qty 1

## 2020-11-14 MED ORDER — IOHEXOL 350 MG/ML SOLN
80.0000 mL | Freq: Once | INTRAVENOUS | Status: AC | PRN
  Administered 2020-11-14: 80 mL via INTRAVENOUS

## 2020-11-14 MED ORDER — ASPIRIN EC 325 MG PO TBEC: 325.0000 mg | DELAYED_RELEASE_TABLET | Freq: Every day | ORAL | Status: DC

## 2020-11-14 MED ORDER — ONDANSETRON HCL 4 MG/2ML IJ SOLN
4.0000 mg | Freq: Once | INTRAMUSCULAR | Status: AC
  Administered 2020-11-14: 4 mg via INTRAVENOUS
  Filled 2020-11-14: qty 2

## 2020-11-14 MED ORDER — LEVOTHYROXINE SODIUM 50 MCG PO TABS
50.0000 ug | ORAL_TABLET | Freq: Every day | ORAL | Status: DC
  Administered 2020-11-15 – 2020-11-20 (×6): 50 ug via ORAL
  Filled 2020-11-14 (×6): qty 1

## 2020-11-14 MED ORDER — ALLOPURINOL 100 MG PO TABS: 100.0000 mg | ORAL_TABLET | Freq: Two times a day (BID) | ORAL | Status: DC

## 2020-11-14 MED ORDER — ACETAMINOPHEN 325 MG PO TABS: 650.0000 mg | ORAL_TABLET | Freq: Four times a day (QID) | ORAL | Status: AC | PRN

## 2020-11-14 MED ORDER — LACTATED RINGERS IV BOLUS
1000.0000 mL | Freq: Once | INTRAVENOUS | Status: AC
  Administered 2020-11-14: 1000 mL via INTRAVENOUS

## 2020-11-14 MED ORDER — SODIUM CHLORIDE 0.9 % IV SOLN
2.0000 g | INTRAVENOUS | Status: DC
  Administered 2020-11-14 – 2020-11-15 (×2): 2 g via INTRAVENOUS
  Filled 2020-11-14 (×3): qty 20

## 2020-11-14 MED ORDER — ROSUVASTATIN CALCIUM 20 MG PO TABS: 20.0000 mg | ORAL_TABLET | Freq: Every day | ORAL | Status: DC

## 2020-11-14 MED ORDER — ACETAMINOPHEN 650 MG RE SUPP: 650.0000 mg | Freq: Four times a day (QID) | RECTAL | Status: AC | PRN

## 2020-11-14 MED ORDER — KETOROLAC TROMETHAMINE 15 MG/ML IJ SOLN
15.0000 mg | Freq: Three times a day (TID) | INTRAMUSCULAR | Status: DC | PRN
  Administered 2020-11-14 – 2020-11-15 (×2): 15 mg via INTRAVENOUS
  Filled 2020-11-14 (×3): qty 1

## 2020-11-14 MED ORDER — ONDANSETRON HCL 4 MG/2ML IJ SOLN: 4.0000 mg | Freq: Four times a day (QID) | INTRAMUSCULAR | Status: DC | PRN

## 2020-11-14 MED ORDER — METRONIDAZOLE 500 MG/100ML IV SOLN
500.0000 mg | Freq: Three times a day (TID) | INTRAVENOUS | Status: AC
  Administered 2020-11-14 – 2020-11-16 (×6): 500 mg via INTRAVENOUS
  Filled 2020-11-14 (×7): qty 100

## 2020-11-14 MED ORDER — BUPROPION HCL ER (XL) 150 MG PO TB24: 150.0000 mg | ORAL_TABLET | Freq: Every day | ORAL | Status: DC

## 2020-11-14 NOTE — ED Provider Notes (Signed)
[facility] Emergency Department Provider Note ____________________________________________   Event Date/Time   First MD Initiated Contact with Patient 11/14/20 1233     (approximate)  I have reviewed the triage vital signs and the nursing notes.  HISTORY  Chief Complaint Abdominal Pain   HPI Jenna [name] is a 80 y.o. femalewho presents to the ED for evaluation of abdominal pain and emesis.  Chart review indicates history of HTN, HLD and CVA.  CKD 3. Continued cigarette smoking. Cannot recall when her last colonoscopy was, saying it was "years ago."  Patient presents to the ED, accompanied by her husband, for evaluation of intermittent abdominal cramping pain of the past 2 days.  She reports intermittent symptoms over the past couple days, lasting a matter of seconds before self resolving, but recurring every 5-30 minutes.  She reports 6 episodes of nonbloody nonbilious emesis of these past 2 days.  She reports constipation with inability to pass a meaningful bowel movement for a couple days and only getting out "a little bit" this morning.  She reports urinary frequency without dysuria or hematuria.  Denies fevers, syncopal episodes, chest pain, shortness of breath or increased sputum production from her baseline cough.  Past Medical History:  Diagnosis Date   BRCA negative 03/2020   MyRisk neg except MSH3 VUS   Family history of breast cancer    IBIS=4.4%/riskscore=2.9%   Gout    Hyperlipidemia    Hypertension    Stroke ([place]) 05/2019   Thyroid disease     Patient Active Problem List   Diagnosis Date Noted   [place] tumor 02/04/2020   Smoking 01/28/2020   Gout 12/31/2019   Proteinuria 10/28/2019   Essential hypertension    Hyperlipidemia    Hypothyroidism    Stage 3a chronic kidney disease ([place])    CVA (cerebral vascular accident) ([place]) 05/12/2019   Acute cardioembolic stroke ([place]) 83/25/4982    Past Surgical History:  Procedure  Laterality Date   BREAST BIOPSY Right 1978   neg   CHOLECYSTECTOMY      Prior to Admission medications   Medication Sig Start Date End Date Taking? Authorizing Provider  allopurinol (ZYLOPRIM) 100 MG tablet Take 100 mg by mouth 2 (two) times daily. 04/03/19   [provider]  aspirin EC 325 MG EC tablet Take 1 tablet (325 mg total) by mouth daily. 05/15/19   Loletha Grayer, MD  buPROPion (WELLBUTRIN XL) 150 MG 24 hr tablet Take 1 tablet (150 mg total) by mouth daily. 03/12/20   Rod Can, CNM  Calcium Carbonate-Vitamin D 600-200 MG-UNIT TABS Take 1 tablet by mouth 2 (two) times daily with a meal.    [provider]  Cholecalciferol 25 MCG (1000 UT) tablet Take by mouth.    [provider]  fenofibrate 54 MG tablet Take by mouth. 10/09/19   [provider]  levothyroxine (SYNTHROID) 50 MCG tablet Take 50 mcg by mouth daily. 04/30/19   [provider]  Multiple Vitamin (MULTIVITAMIN) tablet Take 1 tablet by mouth daily.    [provider]  rosuvastatin (CRESTOR) 20 MG tablet Take by mouth. 09/26/14   [provider]    Allergies Nicotine  Family History  Problem Relation Age of Onset   Breast cancer Maternal Grandmother 70   Breast cancer Cousin 73    Social History Social History   Tobacco Use   Smoking status: Every Day    Packs/day: 0.25    Types: Cigarettes   Smokeless tobacco:  Never   Tobacco comments:    smoking 4-5 cigarettes daily.   Vaping Use   Vaping Use: Never used  Substance Use Topics   Alcohol use: No   Drug use: No    Review of Systems  Constitutional: No fever/chills Eyes: No visual changes. ENT: No sore throat. Cardiovascular: Denies chest pain. Respiratory: Denies shortness of breath. Gastrointestinal: Positive for abdominal pain, nausea, vomiting and constipation. No diarrhea.   Genitourinary: Negative for dysuria. Musculoskeletal: Negative for back pain. Skin: Negative for  rash. Neurological: Negative for headaches, focal weakness or numbness.  ____________________________________________   PHYSICAL EXAM:  VITAL SIGNS: Vitals:   11/14/20 1152 11/14/20 1300  BP: (!) 164/87 (!) 147/79  Pulse: 79 73  Resp: 18   Temp: 98.2 F (36.8 C)   SpO2: 96% 98%    Constitutional: Alert and oriented. Well appearing and in no acute distress. Eyes: Conjunctivae are normal. PERRL. EOMI. Head: Atraumatic. Nose: No congestion/rhinnorhea. Mouth/Throat: Mucous membranes are moist.  Oropharynx non-erythematous. Neck: No stridor. No cervical spine tenderness to palpation. Cardiovascular: Normal rate, regular rhythm. Grossly normal heart sounds.  Good peripheral circulation. Respiratory: Normal respiratory effort.  No retractions. Lungs CTAB. Gastrointestinal: Soft , nondistended,. No CVA tenderness. Diffuse right-sided and lower abdominal tenderness to palpation without localizing or peritoneal features.  Left-sided and epigastrium is benign. Musculoskeletal: No lower extremity tenderness nor edema.  No joint effusions. No signs of acute trauma. Neurologic:  Normal speech and language. No gross focal neurologic deficits are appreciated. No gait instability noted. Skin:  Skin is warm, dry and intact. No rash noted. Psychiatric: Mood and affect are normal. Speech and behavior are normal. ____________________________________________   LABS (all labs ordered are listed, but only abnormal results are displayed)  Labs Reviewed  COMPREHENSIVE METABOLIC PANEL - Abnormal; Notable for the following components:      Result Value   Glucose, Bld 135 (*)    GFR, Estimated 59 (*)    All other components within normal limits  URINALYSIS, COMPLETE (UACMP) WITH MICROSCOPIC - Abnormal; Notable for the following components:   Color, Urine YELLOW (*)    APPearance HAZY (*)    Leukocytes,Ua SMALL (*)    Bacteria, UA RARE (*)    All other components within normal limits  RESP PANEL  BY RT-PCR (FLU A&B, COVID) ARPGX2  LIPASE, BLOOD  CBC   ____________________________________________  12 Lead EKG  Sinus rhythm with a rate of 81 bpm.  Rightward axis.  Normal intervals.  No evidence of acute ischemia. ____________________________________________  RADIOLOGY  ED MD interpretation: CT reviewed by me with obstructive pathology  Official radiology report(s): CT ABDOMEN PELVIS W CONTRAST  Result Date: 11/14/2020 CLINICAL DATA:  Intermittent lower abdominal pain, constipation, emesis. EXAM: CT ABDOMEN AND PELVIS WITH CONTRAST TECHNIQUE: Multidetector CT imaging of the abdomen and pelvis was performed using the standard protocol following bolus administration of intravenous contrast. CONTRAST:  37m OMNIPAQUE IOHEXOL 350 MG/ML SOLN COMPARISON:  CT abdomen pelvis dated 09/06/2009. FINDINGS: Lower chest: No acute abnormality. Hepatobiliary: No focal liver abnormality is seen. Status post cholecystectomy. No biliary dilatation. Pancreas: Unremarkable. No pancreatic ductal dilatation or surrounding inflammatory changes. Spleen: Normal in size without focal abnormality. Adrenals/Urinary Tract: Adrenal glands are unremarkable. Other than a 1.2 cm right renal cyst, the kidneys are normal, without renal calculi, focal lesion, or hydronephrosis. Bladder is unremarkable. Stomach/Bowel: Stomach is within normal limits. Colonic diverticulosis is noted. There is bowel wall thickening and associated inflammatory changes of the descending colon. Soft tissue  density along the medial aspect of the descending colon (series 2 image 36-37 and series 8, image 56) measures 1.3 cm and may represent a developing abscess or colonic mass. There is distension of the colon proximal to this location, consistent with at least some degree of obstruction at this point. Vascular/Lymphatic: Aortic atherosclerosis. No enlarged abdominal or pelvic lymph nodes. Reproductive: Status post hysterectomy. No adnexal masses.  Other: No abdominal wall hernia or abnormality. No abdominopelvic ascites. Musculoskeletal: The patient is status post a left hip arthroplasty. Degenerative changes are seen in the spine. IMPRESSION: Acute diverticulitis involving the descending colon results in at least some degree of bowel obstruction. Soft tissue density in this area may represent a developing abscess or colonic mass. Colonoscopy is recommended to evaluate for an underlying colonic mass after the patient's acute condition resolves. Electronically Signed   By: Zerita Boers M.D.   On: 11/14/2020 14:49    ____________________________________________   PROCEDURES and INTERVENTIONS  Procedure(s) performed (including Critical Care):  .1-3 Lead EKG Interpretation  Date/Time: 11/14/2020 1:13 PM Performed by: Vladimir Crofts, MD Authorized by: Vladimir Crofts, MD     Interpretation: normal     ECG rate:  74   ECG rate assessment: normal     Rhythm: sinus rhythm     Ectopy: none     Conduction: normal    Medications  fentaNYL (SUBLIMAZE) injection 50 mcg (has no administration in time range)  lactated ringers bolus 1,000 mL (1,000 mLs Intravenous New Bag/Given 11/14/20 1334)  ondansetron (ZOFRAN) injection 4 mg (4 mg Intravenous Given 11/14/20 1331)  morphine 4 MG/ML injection 4 mg (4 mg Intravenous Given 11/14/20 1332)  iohexol (OMNIPAQUE) 350 MG/ML injection 80 mL (80 mLs Intravenous Contrast Given 11/14/20 1351)    ____________________________________________   MDM / ED COURSE   Pleasant 80 year old woman presents to the ED with few days of abdominal pain, emesis and difficulty stooling, with evidence of diverticulitis and perirectal mass versus abscess, requiring medical admission for pain control and further evaluation.  Normal vitals.  Exam with poorly localizing lower abdominal tenderness to palpation without peritoneal features.  Does have some intermittent voluntary guarding.  Blood work is reassuring without evidence of  leukocytosis, renal dysfunction or other acute pathology.  No evidence of acute cystitis or pancreatitis.  CT imaging obtained and demonstrates an odd combination of acute diverticulitis and small bowel obstruction.  CT read is noted to include abscess versus mass around her rectum.  She has no evidence of abscess, sepsis systemically.  Due to her continued poorly controlled pain and obstructive pathology on CT, we will discuss with medicine for admission.  Clinical Course as of 11/14/20 1510  Sat Nov 14, 2020  1314 Discussed plan of care with patient and her husband to include symptomatic measures, IV fluids and CT imaging of her abdomen/pelvis. [DS]  [id number].  Patient reports poorly controlled pain.  We discussed your CT results and the possibility of a perirectal mass versus developing abscess associated with diverticulitis.  Due to his obstructive pathology and poorly controlled pain, we will discuss with medicine for admission. [DS]    Clinical Course User Index [DS] Vladimir Crofts, MD    ____________________________________________   FINAL CLINICAL IMPRESSION(S) / ED DIAGNOSES  Final diagnoses:  Lower abdominal pain  Rectal mass     ED Discharge Orders     None        Harlene Petralia Tamala Julian   Note:  This document was prepared using Systems analyst  and may include unintentional dictation errors.    Vladimir Crofts, MD 11/14/20 [phone]

## 2020-11-14 NOTE — ED Notes (Signed)
RN to bedside to introduce self. Pt advised this abd pain started yesterday and just got worse.

## 2020-11-14 NOTE — ED Triage Notes (Signed)
Pt comes pov with generalized abd cramping and emesis for about 14 hours.

## 2020-11-14 NOTE — H&P (Signed)
History and Physical   Jenna [name] WYO:[id number] DOB: 07-02-1940 DOA: 11/14/2020  PCP: Perrin Maltese, MD  Outpatient Specialists: [facility] Patient coming from: Home  I have personally briefly reviewed patient's old medical records in [place].  Chief Concern: Abdominal pain  HPI: Jenna [name] is a 80 y.o. female with medical history significant for history of acute cardioembolic stroke in February 2021 in the left temporal parietal junction, with punctate acute infarction in the right parietal lobe, 11 mm meningioma, hypothyroid, hyperlipidemia, hypertension, CKD 3A, history of gout, presents emergency department for chief concerns of abdominal pain that started on 11/12/2020.  She describes the abdominal pain as sharp, 10 out of 10, comes and goes, lasting 2-3 minutes, and that standing up makes it go away. The pain started when she was laying in bed and standing up relieved the pain. She endorses nausea and vomiting, 6x at home and once in the ED. The vomitus was yellow. She reports the pain is similar to having gass only more extensive.   Her last PO intake was last evening, bacon and eggs.   She reports she has not had a meaningful bowel movements in several days and that most of her stool has been watery and or pencil thin. She denies weight loss.   Social history: She lives at home with her husband.  She is currently retired and formerly worked as a Engineer, technical sales.  She endorses tobacco use, over 50 years.  She denies frequent EtOH and recreational drug use.  Vaccination history: She is vaccinated for COVID-19, 3 doses from [place]  ROS: Constitutional: no weight change, no fever ENT/Mouth: no sore throat, no rhinorrhea Eyes: no eye pain, no vision changes Cardiovascular: no chest pain, no dyspnea,  no edema, no palpitations Respiratory: no cough, no sputum, no wheezing Gastrointestinal: + nausea, + vomiting, no diarrhea, +  constipation Genitourinary: no urinary incontinence, no dysuria, no hematuria Musculoskeletal: no arthralgias, no myalgias Skin: no skin lesions, no pruritus, Neuro: + weakness, no loss of consciousness, no syncope Psych: no anxiety, no depression, + decrease appetite Heme/Lymph: no bruising, no bleeding  ED Course: Discussed with emergency medicine provider, patient requiring hospitalization for pain control in setting of diverticulitis and possible colonic mass.  Vitals in the emergency department was remarkable for temperature 98.2, respiration rate of 18, heart rate 79, blood pressure 164/87, SPO2 of 96% on room air.  Labs in the emergency department was remarkable for sodium 138, potassium 3.9, chloride 105, bicarb 22, BUN of 17, serum creatinine of 0.98, nonfasting blood glucose 135, EGFR 59, WBC 9.1, hemoglobin 13, platelets 254.  UA showed small leukocytes.  ED provider ordered morphine 4 mg IV, Zofran 4 mg IV, fentanyl 50 mcg, lactated ringer 1 L bolus.  Assessment/Plan  Principal Problem:   Diverticulitis large intestine Active Problems:   CVA (cerebral vascular accident) ([place])   Essential hypertension   Hyperlipidemia   Hypothyroidism   Stage 3a chronic kidney disease ([place])   Abdominal pain   Constipation   Colonic mass   # Abdominal pain suspect multifactorial in setting of diverticulitis, constipation  Diverticulitis-low to intermediate risk community-acquired - Pain control: Morphine 2 mg IV every 4 hours as needed for severe pain, ketorolac injection 15 mg IV every 6 hours as needed for moderate pain, 1 day ordered, acetaminophen 650 mg p.o. every 6 hours as needed for mild pain, fever, headache - Added complete UA with microscopic - Ceftriaxone 2 g IV daily,  metronidazole 500 mg every 8 hours IV have been initiated and these can be transitioned to p.o. antibiotics on discharge - I recommend a.m. team to either consult general surgery and or referral to outpatient  general surgery if patient is appropriate for discharge for evaluation of colonic mass  # History of hypertension now hypotensive I suspect this is secondary to pain medication administration in the ED - Status post lactated ringer 1 L bolus - Ordered additional lactated ringer 1 L bolus  # History of cardioembolic stroke in [id number] 325 mg p.o. daily resumed  # Hypothyroid-levothyroxine 50 mcg every morning resumed # Depression/anxiety-bupropion 150 mg daily resumed # Hyperlipidemia-rosuvastatin 20 mg nightly resumed # meningioma (11 mm)-outpatient follow-up with neurologist as recommended for regular follow-up # CKD stage IIIa-at baseline, serum creatinine of 0.98, GFR 59 on presentation # History of gout-currently in remission-allopurinol 100 mg twice daily resumed  COVID PCR is pending  Med reconciliation is pending  Chart reviewed.   Hospitalization from 05/12/2019 to 05/14/2019: 4 acute cardioembolic stroke was given tPA by neurology and patient was taken to ICU.  Extensive left temporoparietal junction, also punctate acute infarction of the right parietal lobe.  No hemorrhage at that time.  11 mm meningioma was seen.  DVT prophylaxis: Heparin 5000 units subcutaneous every 8 hours Code Status: Full code Diet: Heart healthy, as tolerated Family Communication: Updated plan with husband at bedside, Dr. Victorino Sparrow Disposition Plan: Pending clinical course Consults called: None at this time Admission status: MedSurg, observation, no telemetry  Past Medical History:  Diagnosis Date   BRCA negative 03/2020   MyRisk neg except MSH3 VUS   Family history of breast cancer    IBIS=4.4%/riskscore=2.9%   Gout    Hyperlipidemia    Hypertension    Stroke ([place]) 05/2019   Thyroid disease    Past Surgical History:  Procedure Laterality Date   BREAST BIOPSY Right 1978   neg   CHOLECYSTECTOMY     Social History:  reports that she has been smoking cigarettes. She has been smoking  an average of .25 packs per day. She has never used smokeless tobacco. She reports that she does not drink alcohol and does not use drugs.  Allergies  Allergen Reactions   Nicotine Rash   Family History  Problem Relation Age of Onset   Breast cancer Maternal Grandmother 47   Breast cancer Cousin 19   Family history: Family history reviewed and pertinent maternal grandmother with breast cancer and cousin with breast cancer  Prior to Admission medications   Medication Sig Start Date End Date Taking? Authorizing Provider  allopurinol (ZYLOPRIM) 100 MG tablet Take 100 mg by mouth 2 (two) times daily. 04/03/19   [provider]  aspirin EC 325 MG EC tablet Take 1 tablet (325 mg total) by mouth daily. 05/15/19   Loletha Grayer, MD  buPROPion (WELLBUTRIN XL) 150 MG 24 hr tablet Take 1 tablet (150 mg total) by mouth daily. 03/12/20   Rod Can, CNM  Calcium Carbonate-Vitamin D 600-200 MG-UNIT TABS Take 1 tablet by mouth 2 (two) times daily with a meal.    [provider]  Cholecalciferol 25 MCG (1000 UT) tablet Take by mouth.    [provider]  fenofibrate 54 MG tablet Take by mouth. 10/09/19   [provider]  levothyroxine (SYNTHROID) 50 MCG tablet Take 50 mcg by mouth daily. 04/30/19   [provider]  Multiple Vitamin (MULTIVITAMIN) tablet Take 1 tablet by mouth daily.  [provider]  rosuvastatin (CRESTOR) 20 MG tablet Take by mouth. 09/26/14   [provider]   Physical Exam: Vitals:   11/14/20 1151 11/14/20 1152 11/14/20 1300  BP:  (!) 164/87 (!) 147/79  Pulse:  79 73  Resp:  18   Temp:  98.2 F (36.8 C)   TempSrc:  Oral   SpO2:  96% 98%  Weight: 77.1 kg    Height: 5' 4"  (1.626 m)     Constitutional: appears age-appropriate, NAD, calm, comfortable Eyes: PERRL, lids and conjunctivae normal ENMT: Mucous membranes are moist. Posterior pharynx clear of any exudate or lesions. Age-appropriate dentition. Hearing  appropriate Neck: normal, supple, no masses, no thyromegaly Respiratory: clear to auscultation bilaterally, no wheezing, no crackles. Normal respiratory effort. No accessory muscle use.  Cardiovascular: Regular rate and rhythm, no murmurs / rubs / gallops. No extremity edema. 2+ pedal pulses. No carotid bruits.  Abdomen: Mildly obese abdomen, + tenderness diffuse, no masses palpated, no hepatosplenomegaly. Bowel sounds positive.  Musculoskeletal: no clubbing / cyanosis. No joint deformity upper and lower extremities. Good ROM, no contractures, no atrophy. Normal muscle tone.  Skin: no rashes, lesions, ulcers. No induration Neurologic: Sensation intact. Strength 5/5 in all 4.  Psychiatric: Normal judgment and insight. Alert and oriented x 3. Normal mood.   EKG: independently reviewed, showing sinus rhythm with rate of 81, QTc 462  Chest x-ray on Admission: I personally reviewed and I agree with radiologist reading as below.  CT ABDOMEN PELVIS W CONTRAST  Result Date: 11/14/2020 CLINICAL DATA:  Intermittent lower abdominal pain, constipation, emesis. EXAM: CT ABDOMEN AND PELVIS WITH CONTRAST TECHNIQUE: Multidetector CT imaging of the abdomen and pelvis was performed using the standard protocol following bolus administration of intravenous contrast. CONTRAST:  10m OMNIPAQUE IOHEXOL 350 MG/ML SOLN COMPARISON:  CT abdomen pelvis dated 09/06/2009. FINDINGS: Lower chest: No acute abnormality. Hepatobiliary: No focal liver abnormality is seen. Status post cholecystectomy. No biliary dilatation. Pancreas: Unremarkable. No pancreatic ductal dilatation or surrounding inflammatory changes. Spleen: Normal in size without focal abnormality. Adrenals/Urinary Tract: Adrenal glands are unremarkable. Other than a 1.2 cm right renal cyst, the kidneys are normal, without renal calculi, focal lesion, or hydronephrosis. Bladder is unremarkable. Stomach/Bowel: Stomach is within normal limits. Colonic diverticulosis is  noted. There is bowel wall thickening and associated inflammatory changes of the descending colon. Soft tissue density along the medial aspect of the descending colon (series 2 image 36-37 and series 8, image 56) measures 1.3 cm and may represent a developing abscess or colonic mass. There is distension of the colon proximal to this location, consistent with at least some degree of obstruction at this point. Vascular/Lymphatic: Aortic atherosclerosis. No enlarged abdominal or pelvic lymph nodes. Reproductive: Status post hysterectomy. No adnexal masses. Other: No abdominal wall hernia or abnormality. No abdominopelvic ascites. Musculoskeletal: The patient is status post a left hip arthroplasty. Degenerative changes are seen in the spine. IMPRESSION: Acute diverticulitis involving the descending colon results in at least some degree of bowel obstruction. Soft tissue density in this area may represent a developing abscess or colonic mass. Colonoscopy is recommended to evaluate for an underlying colonic mass after the patient's acute condition resolves. Electronically Signed   By: TZerita BoersM.D.   On: 11/14/2020 14:49    Labs on Admission: I have personally reviewed following labs  CBC: Recent Labs  Lab 11/14/20 1153  WBC 9.1  HGB 13.0  HCT 38.4  MCV 90.8  PLT 2322  Basic Metabolic Panel:  Recent Labs  Lab 11/14/20 1153  NA 138  K 3.9  CL 105  CO2 22  GLUCOSE 135*  BUN 17  CREATININE 0.98  CALCIUM 9.3   GFR: Estimated Creatinine Clearance: 48.3 mL/min (by C-G formula based on SCr of 0.98 mg/dL).  Liver Function Tests: Recent Labs  Lab 11/14/20 1153  AST 16  ALT 9  ALKPHOS 50  BILITOT 0.9  PROT 7.5  ALBUMIN 4.2   Recent Labs  Lab 11/14/20 1153  LIPASE 28   Urine analysis:    Component Value Date/Time   COLORURINE YELLOW (A) 11/14/2020 1153   APPEARANCEUR HAZY (A) 11/14/2020 1153   LABSPEC 1.013 11/14/2020 1153   PHURINE 6.0 11/14/2020 1153   GLUCOSEU NEGATIVE  11/14/2020 1153   [place] 11/14/2020 1153   [place] 11/14/2020 1153   [place] 11/14/2020 [place] 11/14/2020 1153   NITRITE NEGATIVE 11/14/2020 1153   LEUKOCYTESUR SMALL (A) 11/14/2020 1153   Dr. Tobie Poet [facility]  If 7PM-7AM, please contact overnight-coverage provider If 7AM-7PM, please contact day coverage provider [email or web address]  11/14/2020, 3:28 PM

## 2020-11-14 NOTE — ED Notes (Signed)
Went in to assess pt after return from CT and check status of IV fluids. Her IV fluids had been disconnected and not reconnected when CT returned her. RN hooked them back up and she is now resting.

## 2020-11-15 DIAGNOSIS — Z7982 Long term (current) use of aspirin: Secondary | ICD-10-CM | POA: Diagnosis not present

## 2020-11-15 DIAGNOSIS — F17210 Nicotine dependence, cigarettes, uncomplicated: Secondary | ICD-10-CM | POA: Diagnosis present

## 2020-11-15 DIAGNOSIS — Z79890 Hormone replacement therapy (postmenopausal): Secondary | ICD-10-CM | POA: Diagnosis not present

## 2020-11-15 DIAGNOSIS — I129 Hypertensive chronic kidney disease with stage 1 through stage 4 chronic kidney disease, or unspecified chronic kidney disease: Secondary | ICD-10-CM | POA: Diagnosis present

## 2020-11-15 DIAGNOSIS — E039 Hypothyroidism, unspecified: Secondary | ICD-10-CM | POA: Diagnosis present

## 2020-11-15 DIAGNOSIS — M109 Gout, unspecified: Secondary | ICD-10-CM | POA: Diagnosis present

## 2020-11-15 DIAGNOSIS — C186 Malignant neoplasm of descending colon: Secondary | ICD-10-CM | POA: Diagnosis present

## 2020-11-15 DIAGNOSIS — K5732 Diverticulitis of large intestine without perforation or abscess without bleeding: Secondary | ICD-10-CM | POA: Diagnosis present

## 2020-11-15 DIAGNOSIS — F32A Depression, unspecified: Secondary | ICD-10-CM | POA: Diagnosis present

## 2020-11-15 DIAGNOSIS — K5792 Diverticulitis of intestine, part unspecified, without perforation or abscess without bleeding: Secondary | ICD-10-CM | POA: Diagnosis present

## 2020-11-15 DIAGNOSIS — Z803 Family history of malignant neoplasm of breast: Secondary | ICD-10-CM | POA: Diagnosis not present

## 2020-11-15 DIAGNOSIS — Z79899 Other long term (current) drug therapy: Secondary | ICD-10-CM | POA: Diagnosis not present

## 2020-11-15 DIAGNOSIS — Z8673 Personal history of transient ischemic attack (TIA), and cerebral infarction without residual deficits: Secondary | ICD-10-CM | POA: Diagnosis not present

## 2020-11-15 DIAGNOSIS — K659 Peritonitis, unspecified: Secondary | ICD-10-CM | POA: Diagnosis present

## 2020-11-15 DIAGNOSIS — E785 Hyperlipidemia, unspecified: Secondary | ICD-10-CM | POA: Diagnosis present

## 2020-11-15 DIAGNOSIS — K631 Perforation of intestine (nontraumatic): Secondary | ICD-10-CM | POA: Diagnosis present

## 2020-11-15 DIAGNOSIS — I959 Hypotension, unspecified: Secondary | ICD-10-CM | POA: Diagnosis present

## 2020-11-15 DIAGNOSIS — E871 Hypo-osmolality and hyponatremia: Secondary | ICD-10-CM | POA: Diagnosis present

## 2020-11-15 DIAGNOSIS — F419 Anxiety disorder, unspecified: Secondary | ICD-10-CM | POA: Diagnosis present

## 2020-11-15 DIAGNOSIS — Z86011 Personal history of benign neoplasm of the brain: Secondary | ICD-10-CM | POA: Diagnosis not present

## 2020-11-15 DIAGNOSIS — N179 Acute kidney failure, unspecified: Secondary | ICD-10-CM | POA: Diagnosis present

## 2020-11-15 DIAGNOSIS — Z20822 Contact with and (suspected) exposure to covid-19: Secondary | ICD-10-CM | POA: Diagnosis present

## 2020-11-15 DIAGNOSIS — N1831 Chronic kidney disease, stage 3a: Secondary | ICD-10-CM | POA: Diagnosis present

## 2020-11-15 LAB — CBC
HCT: 33.1 % — ABNORMAL LOW (ref 36.0–46.0)
Hemoglobin: 11 g/dL — ABNORMAL LOW (ref 12.0–15.0)
MCH: 30.6 pg (ref 26.0–34.0)
MCHC: 33.2 g/dL (ref 30.0–36.0)
MCV: 92.2 fL (ref 80.0–100.0)
Platelets: 226 10*3/uL (ref 150–400)
RBC: 3.59 MIL/uL — ABNORMAL LOW (ref 3.87–5.11)
RDW: 13.5 % (ref 11.5–15.5)
WBC: 8 10*3/uL (ref 4.0–10.5)
nRBC: 0 % (ref 0.0–0.2)

## 2020-11-15 LAB — BASIC METABOLIC PANEL
Anion gap: 10 (ref 5–15)
BUN: 16 mg/dL (ref 8–23)
CO2: 22 mmol/L (ref 22–32)
Calcium: 8.4 mg/dL — ABNORMAL LOW (ref 8.9–10.3)
Chloride: 105 mmol/L (ref 98–111)
Creatinine, Ser: 0.95 mg/dL (ref 0.44–1.00)
GFR, Estimated: >60 mL/min (ref >60)
Glucose, Bld: 109 mg/dL — ABNORMAL HIGH (ref 70–99)
Potassium: 3.3 mmol/L — ABNORMAL LOW (ref 3.5–5.1)
Sodium: 137 mmol/L (ref 135–145)

## 2020-11-15 MED ORDER — LACTATED RINGERS IV SOLN: INTRAVENOUS | Status: DC

## 2020-11-15 MED ORDER — ROSUVASTATIN CALCIUM 10 MG PO TABS
40.0000 mg | ORAL_TABLET | Freq: Every day | ORAL | Status: DC
  Administered 2020-11-15: 40 mg via ORAL
  Filled 2020-11-15: qty 4

## 2020-11-15 MED ORDER — ALLOPURINOL 100 MG PO TABS
100.0000 mg | ORAL_TABLET | Freq: Every day | ORAL | Status: DC
  Administered 2020-11-15 – 2020-11-16 (×2): 100 mg via ORAL
  Filled 2020-11-15 (×3): qty 1

## 2020-11-15 MED ORDER — KETOROLAC TROMETHAMINE 15 MG/ML IJ SOLN
15.0000 mg | Freq: Four times a day (QID) | INTRAMUSCULAR | Status: AC
  Administered 2020-11-15 – 2020-11-17 (×7): 15 mg via INTRAVENOUS
  Filled 2020-11-15 (×7): qty 1

## 2020-11-15 MED ORDER — ASPIRIN EC 81 MG PO TBEC
81.0000 mg | DELAYED_RELEASE_TABLET | Freq: Every day | ORAL | Status: DC
  Administered 2020-11-15 – 2020-11-16 (×2): 81 mg via ORAL
  Filled 2020-11-15 (×2): qty 1

## 2020-11-15 MED ORDER — SIMETHICONE 80 MG PO CHEW
80.0000 mg | CHEWABLE_TABLET | Freq: Four times a day (QID) | ORAL | Status: DC
  Administered 2020-11-15 – 2020-11-20 (×15): 80 mg via ORAL
  Filled 2020-11-15 (×27): qty 1

## 2020-11-15 MED ORDER — POTASSIUM CHLORIDE CRYS ER 20 MEQ PO TBCR
40.0000 meq | EXTENDED_RELEASE_TABLET | Freq: Once | ORAL | Status: AC
  Administered 2020-11-15: 40 meq via ORAL
  Filled 2020-11-15: qty 2

## 2020-11-15 NOTE — Progress Notes (Signed)
PROGRESS NOTE    Jenna [name]  WGY:[id number] DOB: 1940-06-24 DOA: 11/14/2020 PCP: Perrin Maltese, MD   Brief Narrative:  80 y.o. female with medical history significant for history of acute cardioembolic stroke in February 2021 in the left temporal parietal junction, with punctate acute infarction in the right parietal lobe, 11 mm meningioma, hypothyroid, hyperlipidemia, hypertension, CKD 3A, history of gout, presents emergency department for chief concerns of abdominal pain that started on 11/12/2020.   She describes the abdominal pain as sharp, 10 out of 10, comes and goes, lasting 2-3 minutes, and that standing up makes it go away. The pain started when she was laying in bed and standing up relieved the pain. She endorses nausea and vomiting, 6x at home and once in the ED. The vomitus was yellow. She reports the pain is similar to having gass only more extensive.    Her last PO intake was last evening, bacon and eggs.    She reports she has not had a meaningful bowel movements in several days and that most of her stool has been watery and or pencil thin. She denies weight loss.   CT abdomen pelvis significant for acute diverticulitis with at least partial associated obstruction and 1.3cm density concerning for abscess versus mass.   Assessment & Plan:   Principal Problem:   Diverticulitis large intestine Active Problems:   CVA (cerebral vascular accident) ([place])   Essential hypertension   Hyperlipidemia   Hypothyroidism   Stage 3a chronic kidney disease ([place])   Abdominal pain   Constipation   Colonic mass  Acute diverticulitis Possible obstruction Possible abscess versus mass 1.3 cm Patient has a week of worsening abdominal pain Irregular bowel habits, inability to tolerate p.o. Plan: Scale back diet to full liquids Multimodal pain regimen Continue Rocephin and Flagyl Likely will need general surgery versus GI consultation.  Will attempt conservative therapy for today  and reach out to consultant services in a.m. if patient remains symptomatic  History of hypertension Hypotensive in the ED suspected his pain medication Continue IVF for today  History of CVA PTA aspirin  Hypothyroidism Synthroid 50 mcg  Depression/anxiety Wellbutrin 50 mg daily  Hyperlipidemia PTA statin  CKD stage IIIa Creatinine baseline  History of meningioma Outpatient follow-up  History of gout No signs of flare Allopurinol 100 mg twice daily   DVT prophylaxis: SQ heparin Code Status: Full Family Communication: Husband at bedside Disposition Plan: Status is: Observation  The patient will require care spanning > 2 midnights and should be moved to inpatient because: Inpatient level of care appropriate due to severity of illness  Dispo: The patient is from: Home              Anticipated d/c is to: Home              Patient currently is not medically stable to d/c.   Difficult to place patient No       Level of care: Med-Surg  Consultants:  None  Procedures:  None  Antimicrobials:  Ceftriaxone Metronidazole   Subjective: Seen and examined.  Reports abdominal distention with improved abdominal pain.  Objective: Vitals:   11/14/20 1650 11/14/20 2020 11/15/20 0417 11/15/20 0810  BP: 133/71 125/66 118/66 121/69  Pulse: 73 74 72 64  Resp: 18 17 17 16   Temp: 98.3 F (36.8 C) 98.3 F (36.8 C) 98.4 F (36.9 C) 98.2 F (36.8 C)  TempSrc: Oral   Oral  SpO2: 96% 96% 94% 96%  Weight:  Height:        Intake/Output Summary (Last 24 hours) at 11/15/2020 1054 Last data filed at 11/15/2020 1025 Gross per 24 hour  Intake 2673.07 ml  Output --  Net 2673.07 ml   Filed Weights   11/14/20 1151  Weight: 77.1 kg    Examination:  General exam: Appears calm and comfortable  Respiratory system: Clear to auscultation. Respiratory effort normal. Cardiovascular system: S1-S2, regular rate and rhythm, no murmurs, no pedal edema Gastrointestinal  system: Soft, mild distention, hypoactive bowel sounds Central nervous system: Alert and oriented. No focal neurological deficits. Extremities: Symmetric 5 x 5 power. Skin: No rashes, lesions or ulcers Psychiatry: Judgement and insight appear normal. Mood & affect appropriate.     Data Reviewed: I have personally reviewed following labs and imaging studies  CBC: Recent Labs  Lab 11/14/20 1153 11/15/20 0528  WBC 9.1 8.0  HGB 13.0 11.0*  HCT 38.4 33.1*  MCV 90.8 92.2  PLT 254 119   Basic Metabolic Panel: Recent Labs  Lab 11/14/20 1153 11/15/20 0528  NA 138 137  K 3.9 3.3*  CL 105 105  CO2 22 22  GLUCOSE 135* 109*  BUN 17 16  CREATININE 0.98 0.95  CALCIUM 9.3 8.4*   GFR: Estimated Creatinine Clearance: 49.9 mL/min (by C-G formula based on SCr of 0.95 mg/dL). Liver Function Tests: Recent Labs  Lab 11/14/20 1153  AST 16  ALT 9  ALKPHOS 50  BILITOT 0.9  PROT 7.5  ALBUMIN 4.2   Recent Labs  Lab 11/14/20 1153  LIPASE 28   No results for input(s): AMMONIA in the last 168 hours. Coagulation Profile: No results for input(s): INR, PROTIME in the last 168 hours. Cardiac Enzymes: No results for input(s): CKTOTAL, CKMB, CKMBINDEX, TROPONINI in the last 168 hours. BNP (last 3 results) No results for input(s): PROBNP in the last 8760 hours. HbA1C: No results for input(s): HGBA1C in the last 72 hours. CBG: No results for input(s): GLUCAP in the last 168 hours. Lipid Profile: No results for input(s): CHOL, HDL, LDLCALC, TRIG, CHOLHDL, LDLDIRECT in the last 72 hours. Thyroid Function Tests: No results for input(s): TSH, T4TOTAL, FREET4, T3FREE, THYROIDAB in the last 72 hours. Anemia Panel: No results for input(s): VITAMINB12, FOLATE, FERRITIN, TIBC, IRON, RETICCTPCT in the last 72 hours. Sepsis Labs: No results for input(s): PROCALCITON, LATICACIDVEN in the last 168 hours.  Recent Results (from the past 240 hour(s))  Resp Panel by RT-PCR (Flu A&B, Covid)  Nasopharyngeal Swab     Status: None   Collection Time: 11/14/20  3:46 PM   Specimen: Nasopharyngeal Swab; Nasopharyngeal(NP) swabs in vial transport medium  Result Value Ref Range Status   SARS Coronavirus 2 by RT PCR NEGATIVE NEGATIVE Final    Comment: (NOTE) SARS-CoV-2 target nucleic acids are NOT DETECTED.  The SARS-CoV-2 RNA is generally detectable in upper respiratory specimens during the acute phase of infection. The lowest concentration of SARS-CoV-2 viral copies this assay can detect is 138 copies/mL. A negative result does not preclude SARS-Cov-2 infection and should not be used as the sole basis for treatment or other patient management decisions. A negative result may occur with  improper specimen collection/handling, submission of specimen other than nasopharyngeal swab, presence of viral mutation(s) within the areas targeted by this assay, and inadequate number of viral copies(<138 copies/mL). A negative result must be combined with clinical observations, patient history, and epidemiological information. The expected result is Negative.  Fact Sheet for Patients:  EntrepreneurPulse.com.au  Fact Sheet for  Healthcare Providers:  IncredibleEmployment.be  This test is no t yet approved or cleared by the [place] and  has been authorized for detection and/or diagnosis of SARS-CoV-2 by FDA under an Emergency Use Authorization (EUA). This EUA will remain  in effect (meaning this test can be used) for the duration of the COVID-19 declaration under Section 564(b)(1) of the Act, 21 U.S.C.section 360bbb-3(b)(1), unless the authorization is terminated  or revoked sooner.       Influenza A by PCR NEGATIVE NEGATIVE Final   Influenza B by PCR NEGATIVE NEGATIVE Final    Comment: (NOTE) The Xpert Xpress SARS-CoV-2/FLU/RSV plus assay is intended as an aid in the diagnosis of influenza from Nasopharyngeal swab specimens and should not be  used as a sole basis for treatment. Nasal washings and aspirates are unacceptable for Xpert Xpress SARS-CoV-2/FLU/RSV testing.  Fact Sheet for Patients: EntrepreneurPulse.com.au  Fact Sheet for Healthcare Providers: IncredibleEmployment.be  This test is not yet approved or cleared by the [place] FDA and has been authorized for detection and/or diagnosis of SARS-CoV-2 by FDA under an Emergency Use Authorization (EUA). This EUA will remain in effect (meaning this test can be used) for the duration of the COVID-19 declaration under Section 564(b)(1) of the Act, 21 U.S.C. section 360bbb-3(b)(1), unless the authorization is terminated or revoked.  Performed at [facility], [address] [place], [place] [address]          Radiology Studies: CT ABDOMEN PELVIS W CONTRAST  Result Date: 11/14/2020 CLINICAL DATA:  Intermittent lower abdominal pain, constipation, emesis. EXAM: CT ABDOMEN AND PELVIS WITH CONTRAST TECHNIQUE: Multidetector CT imaging of the abdomen and pelvis was performed using the standard protocol following bolus administration of intravenous contrast. CONTRAST:  75mL OMNIPAQUE IOHEXOL 350 MG/ML SOLN COMPARISON:  CT abdomen pelvis dated 09/06/2009. FINDINGS: Lower chest: No acute abnormality. Hepatobiliary: No focal liver abnormality is seen. Status post cholecystectomy. No biliary dilatation. Pancreas: Unremarkable. No pancreatic ductal dilatation or surrounding inflammatory changes. Spleen: Normal in size without focal abnormality. Adrenals/Urinary Tract: Adrenal glands are unremarkable. Other than a 1.2 cm right renal cyst, the kidneys are normal, without renal calculi, focal lesion, or hydronephrosis. Bladder is unremarkable. Stomach/Bowel: Stomach is within normal limits. Colonic diverticulosis is noted. There is bowel wall thickening and associated inflammatory changes of the descending colon. Soft tissue density along  the medial aspect of the descending colon (series 2 image 36-37 and series 8, image 56) measures 1.3 cm and may represent a developing abscess or colonic mass. There is distension of the colon proximal to this location, consistent with at least some degree of obstruction at this point. Vascular/Lymphatic: Aortic atherosclerosis. No enlarged abdominal or pelvic lymph nodes. Reproductive: Status post hysterectomy. No adnexal masses. Other: No abdominal wall hernia or abnormality. No abdominopelvic ascites. Musculoskeletal: The patient is status post a left hip arthroplasty. Degenerative changes are seen in the spine. IMPRESSION: Acute diverticulitis involving the descending colon results in at least some degree of bowel obstruction. Soft tissue density in this area may represent a developing abscess or colonic mass. Colonoscopy is recommended to evaluate for an underlying colonic mass after the patient's acute condition resolves. Electronically Signed   By: Zerita Boers M.D.   On: 11/14/2020 14:49        Scheduled Meds:  allopurinol  100 mg Oral Daily   aspirin  81 mg Oral Daily   heparin  5,000 Units Subcutaneous Q8H   ketorolac  15 mg Intravenous Q6H   levothyroxine  50 mcg Oral Q0600   rosuvastatin  40 mg Oral QHS   simethicone  80 mg Oral QID   Continuous Infusions:  cefTRIAXone (ROCEPHIN)  IV Stopped (11/14/20 1931)   lactated ringers 100 mL/hr at 11/15/20 0944   metronidazole 500 mg (11/15/20 0946)     LOS: 0 days    Time spent: 35 minutes    Sidney Ace, MD [facility] Pager [phone]  If 7PM-7AM, please contact night-coverage [email or web address] Password [facility] 11/15/2020, 10:54 AM

## 2020-11-16 ENCOUNTER — Inpatient Hospital Stay: Payer: Medicare HMO

## 2020-11-16 ENCOUNTER — Encounter
Admission: EM | Disposition: A | Discharge status: Home Health | Payer: Self-pay | Source: Home / Self Care | Attending: General Surgery

## 2020-11-16 ENCOUNTER — Inpatient Hospital Stay: Payer: Medicare HMO | Admitting: Certified Registered"

## 2020-11-16 ENCOUNTER — Encounter: Payer: Self-pay | Admitting: Internal Medicine

## 2020-11-16 DIAGNOSIS — C186 Malignant neoplasm of descending colon: Principal | ICD-10-CM

## 2020-11-16 DIAGNOSIS — K631 Perforation of intestine (nontraumatic): Secondary | ICD-10-CM

## 2020-11-16 DIAGNOSIS — K5732 Diverticulitis of large intestine without perforation or abscess without bleeding: Secondary | ICD-10-CM

## 2020-11-16 HISTORY — PX: COLECTOMY WITH COLOSTOMY CREATION/HARTMANN PROCEDURE: SHX6598

## 2020-11-16 HISTORY — PX: APPLICATION OF WOUND VAC: SHX5189

## 2020-11-16 HISTORY — PX: LAPAROTOMY: SHX154

## 2020-11-16 LAB — CBC WITH DIFFERENTIAL/PLATELET
Abs Immature Granulocytes: 0.05 10*3/uL (ref 0.00–0.07)
Basophils Absolute: 0 10*3/uL (ref 0.0–0.1)
Basophils Relative: 0 %
Eosinophils Absolute: 0.1 10*3/uL (ref 0.0–0.5)
Eosinophils Relative: 0 %
HCT: 33.6 % — ABNORMAL LOW (ref 36.0–46.0)
Hemoglobin: 11.3 g/dL — ABNORMAL LOW (ref 12.0–15.0)
Immature Granulocytes: 0 %
Lymphocytes Relative: 11 %
Lymphs Abs: 1.3 10*3/uL (ref 0.7–4.0)
MCH: 30.1 pg (ref 26.0–34.0)
MCHC: 33.6 g/dL (ref 30.0–36.0)
MCV: 89.4 fL (ref 80.0–100.0)
Monocytes Absolute: 0.6 10*3/uL (ref 0.1–1.0)
Monocytes Relative: 5 %
Neutro Abs: 9.4 10*3/uL — ABNORMAL HIGH (ref 1.7–7.7)
Neutrophils Relative %: 84 %
Platelets: 215 10*3/uL (ref 150–400)
RBC: 3.76 MIL/uL — ABNORMAL LOW (ref 3.87–5.11)
RDW: 13.4 % (ref 11.5–15.5)
WBC: 11.4 10*3/uL — ABNORMAL HIGH (ref 4.0–10.5)
nRBC: 0 % (ref 0.0–0.2)

## 2020-11-16 LAB — LACTIC ACID, PLASMA: Lactic Acid, Venous: 1.4 mmol/L (ref 0.5–1.9)

## 2020-11-16 LAB — BASIC METABOLIC PANEL
Anion gap: 9 (ref 5–15)
BUN: 15 mg/dL (ref 8–23)
CO2: 22 mmol/L (ref 22–32)
Calcium: 8.5 mg/dL — ABNORMAL LOW (ref 8.9–10.3)
Chloride: 104 mmol/L (ref 98–111)
Creatinine, Ser: 0.91 mg/dL (ref 0.44–1.00)
GFR, Estimated: >60 mL/min (ref >60)
Glucose, Bld: 127 mg/dL — ABNORMAL HIGH (ref 70–99)
Potassium: 3.6 mmol/L (ref 3.5–5.1)
Sodium: 135 mmol/L (ref 135–145)

## 2020-11-16 LAB — TYPE AND SCREEN
ABO/RH(D): O POS
Antibody Screen: NEGATIVE

## 2020-11-16 LAB — ABO/RH: ABO/RH(D): O POS

## 2020-11-16 LAB — GLUCOSE, CAPILLARY: Glucose-Capillary: 141 mg/dL — ABNORMAL HIGH (ref 70–99)

## 2020-11-16 SURGERY — LAPAROTOMY, EXPLORATORY
Anesthesia: General | Site: Abdomen

## 2020-11-16 MED ORDER — GLYCOPYRROLATE 0.2 MG/ML IJ SOLN
INTRAMUSCULAR | Status: DC | PRN
  Administered 2020-11-16: .2 mg via INTRAVENOUS

## 2020-11-16 MED ORDER — CALCIUM CHLORIDE 10 % IV SOLN
INTRAVENOUS | Status: DC | PRN
  Administered 2020-11-16: 1 g via INTRAVENOUS

## 2020-11-16 MED ORDER — PHENYLEPHRINE HCL (PRESSORS) 10 MG/ML IV SOLN
INTRAVENOUS | Status: AC
  Filled 2020-11-16: qty 1

## 2020-11-16 MED ORDER — LIDOCAINE HCL (CARDIAC) PF 100 MG/5ML IV SOSY
PREFILLED_SYRINGE | INTRAVENOUS | Status: DC | PRN
  Administered 2020-11-16: 100 mg via INTRAVENOUS

## 2020-11-16 MED ORDER — MIDAZOLAM HCL 2 MG/2ML IJ SOLN
INTRAMUSCULAR | Status: AC
  Filled 2020-11-16: qty 2

## 2020-11-16 MED ORDER — FENTANYL CITRATE (PF) 100 MCG/2ML IJ SOLN
INTRAMUSCULAR | Status: AC
  Filled 2020-11-16: qty 2

## 2020-11-16 MED ORDER — FENTANYL CITRATE (PF) 100 MCG/2ML IJ SOLN: 25.0000 ug | INTRAMUSCULAR | Status: DC | PRN

## 2020-11-16 MED ORDER — PHENYLEPHRINE HCL (PRESSORS) 10 MG/ML IV SOLN
INTRAVENOUS | Status: DC | PRN
  Administered 2020-11-16 (×3): 200 ug via INTRAVENOUS

## 2020-11-16 MED ORDER — ACETAMINOPHEN 10 MG/ML IV SOLN
INTRAVENOUS | Status: DC | PRN
  Administered 2020-11-16: 1000 mg via INTRAVENOUS

## 2020-11-16 MED ORDER — ONDANSETRON HCL 4 MG/2ML IJ SOLN: 4.0000 mg | Freq: Once | INTRAMUSCULAR | Status: DC | PRN

## 2020-11-16 MED ORDER — ACETAMINOPHEN 10 MG/ML IV SOLN
INTRAVENOUS | Status: AC
  Filled 2020-11-16: qty 100

## 2020-11-16 MED ORDER — DEXAMETHASONE SODIUM PHOSPHATE 10 MG/ML IJ SOLN
INTRAMUSCULAR | Status: DC | PRN
  Administered 2020-11-16: 10 mg via INTRAVENOUS

## 2020-11-16 MED ORDER — HYDROMORPHONE HCL 1 MG/ML IJ SOLN
INTRAMUSCULAR | Status: AC
  Filled 2020-11-16: qty 1

## 2020-11-16 MED ORDER — ONDANSETRON HCL 4 MG/2ML IJ SOLN
INTRAMUSCULAR | Status: AC
  Administered 2020-11-16: 4 mg via INTRAVENOUS
  Filled 2020-11-16: qty 2

## 2020-11-16 MED ORDER — EPHEDRINE SULFATE 50 MG/ML IJ SOLN
INTRAMUSCULAR | Status: DC | PRN
  Administered 2020-11-16: 5 mg via INTRAVENOUS

## 2020-11-16 MED ORDER — ROCURONIUM BROMIDE 100 MG/10ML IV SOLN
INTRAVENOUS | Status: DC | PRN
  Administered 2020-11-16: 20 mg via INTRAVENOUS
  Administered 2020-11-16: 60 mg via INTRAVENOUS

## 2020-11-16 MED ORDER — HYDROMORPHONE HCL 1 MG/ML IJ SOLN: 0.5000 mg | INTRAMUSCULAR | Status: DC | PRN

## 2020-11-16 MED ORDER — CALCIUM CHLORIDE 10 % IV SOLN
INTRAVENOUS | Status: AC
  Filled 2020-11-16: qty 10

## 2020-11-16 MED ORDER — ALBUMIN HUMAN 5 % IV SOLN
INTRAVENOUS | Status: DC | PRN
Start: 2020-11-16 — End: 2020-11-16

## 2020-11-16 MED ORDER — ALBUMIN HUMAN 5 % IV SOLN
INTRAVENOUS | Status: AC
  Filled 2020-11-16: qty 250

## 2020-11-16 MED ORDER — METRONIDAZOLE 500 MG/100ML IV SOLN
500.0000 mg | Freq: Three times a day (TID) | INTRAVENOUS | Status: AC
  Administered 2020-11-16 – 2020-11-18 (×6): 500 mg via INTRAVENOUS
  Filled 2020-11-16 (×7): qty 100

## 2020-11-16 MED ORDER — CHLORHEXIDINE GLUCONATE CLOTH 2 % EX PADS
6.0000 | MEDICATED_PAD | Freq: Every day | CUTANEOUS | Status: DC
  Administered 2020-11-17 – 2020-11-19 (×3): 6 via TOPICAL

## 2020-11-16 MED ORDER — HEPARIN SODIUM (PORCINE) 5000 UNIT/ML IJ SOLN
5000.0000 [IU] | Freq: Three times a day (TID) | INTRAMUSCULAR | Status: DC
  Administered 2020-11-16 – 2020-11-19 (×8): 5000 [IU] via SUBCUTANEOUS
  Filled 2020-11-16 (×7): qty 1

## 2020-11-16 MED ORDER — SODIUM CHLORIDE (PF) 0.9 % IJ SOLN
INTRAMUSCULAR | Status: DC | PRN
  Administered 2020-11-16: 100 mL via INTRADERMAL

## 2020-11-16 MED ORDER — HYDROMORPHONE HCL 1 MG/ML IJ SOLN
INTRAMUSCULAR | Status: DC | PRN
  Administered 2020-11-16: 1 mg via INTRAVENOUS

## 2020-11-16 MED ORDER — SUGAMMADEX SODIUM 500 MG/5ML IV SOLN
INTRAVENOUS | Status: DC | PRN
Start: 2020-11-16 — End: 2020-11-16
  Administered 2020-11-16: 500 mg via INTRAVENOUS

## 2020-11-16 MED ORDER — SODIUM CHLORIDE 0.9 % IV SOLN
2.0000 g | INTRAVENOUS | Status: DC
  Administered 2020-11-16 – 2020-11-19 (×4): 2 g via INTRAVENOUS
  Filled 2020-11-16 (×5): qty 20

## 2020-11-16 MED ORDER — ACETAMINOPHEN 500 MG PO TABS
1000.0000 mg | ORAL_TABLET | Freq: Four times a day (QID) | ORAL | Status: DC
  Administered 2020-11-16 – 2020-11-20 (×10): 1000 mg via ORAL
  Filled 2020-11-16 (×12): qty 2

## 2020-11-16 MED ORDER — BUPIVACAINE LIPOSOME 1.3 % IJ SUSP
INTRAMUSCULAR | Status: AC
  Filled 2020-11-16: qty 20

## 2020-11-16 MED ORDER — FENTANYL CITRATE (PF) 100 MCG/2ML IJ SOLN
INTRAMUSCULAR | Status: DC | PRN
  Administered 2020-11-16 (×2): 50 ug via INTRAVENOUS

## 2020-11-16 MED ORDER — SUCCINYLCHOLINE CHLORIDE 200 MG/10ML IV SOSY
PREFILLED_SYRINGE | INTRAVENOUS | Status: DC | PRN
  Administered 2020-11-16: 100 mg via INTRAVENOUS

## 2020-11-16 MED ORDER — SODIUM CHLORIDE 0.9 % IV SOLN
INTRAVENOUS | Status: DC | PRN
  Administered 2020-11-16: 20 ug/min via INTRAVENOUS

## 2020-11-16 MED ORDER — PROPOFOL 10 MG/ML IV BOLUS
INTRAVENOUS | Status: DC | PRN
  Administered 2020-11-16: 150 mg via INTRAVENOUS

## 2020-11-16 MED ORDER — SODIUM CHLORIDE (PF) 0.9 % IJ SOLN
INTRAMUSCULAR | Status: AC
  Filled 2020-11-16: qty 50

## 2020-11-16 MED ORDER — 0.9 % SODIUM CHLORIDE (POUR BTL) OPTIME
TOPICAL | Status: DC | PRN
  Administered 2020-11-16: 4000 mL

## 2020-11-16 MED ORDER — MIDAZOLAM HCL 2 MG/2ML IJ SOLN
INTRAMUSCULAR | Status: DC | PRN
  Administered 2020-11-16: 2 mg via INTRAVENOUS

## 2020-11-16 SURGICAL SUPPLY — 62 items
ADH LQ OCL WTPRF AMP STRL LF (MISCELLANEOUS) ×1
ADHESIVE MASTISOL STRL (MISCELLANEOUS) ×4 IMPLANT
CANISTER SUCT 1200ML W/VALVE (MISCELLANEOUS) IMPLANT
CANISTER WOUND CARE 500ML ATS (WOUND CARE) ×4 IMPLANT
CHLORAPREP W/TINT 26 (MISCELLANEOUS) ×4 IMPLANT
DRAPE LAPAROTOMY 100X77 ABD (DRAPES) ×4 IMPLANT
DRAPE UTILITY 15X26 TOWEL STRL (DRAPES) IMPLANT
DRSG TELFA 4X14 ISLAND NADH (GAUZE/BANDAGES/DRESSINGS) IMPLANT
DRSG TELFA 4X8 ISLAND PHMB (GAUZE/BANDAGES/DRESSINGS) IMPLANT
DRSG VAC ATS LRG SENSATRAC (GAUZE/BANDAGES/DRESSINGS) ×4 IMPLANT
ELECT BLADE 6.5 EXT (BLADE) ×4 IMPLANT
ELECT CAUTERY BLADE TIP 2.5 (TIP) ×4
ELECT EZSTD 165MM 6.5IN (MISCELLANEOUS)
ELECT REM PT RETURN 9FT ADLT (ELECTROSURGICAL) ×4
ELECTRODE CAUTERY BLDE TIP 2.5 (TIP) ×3 IMPLANT
ELECTRODE EZSTD 165MM 6.5IN (MISCELLANEOUS) IMPLANT
ELECTRODE REM PT RTRN 9FT ADLT (ELECTROSURGICAL) ×3 IMPLANT
GAUZE 4X4 16PLY ~~LOC~~+RFID DBL (SPONGE) ×4 IMPLANT
GAUZE SPONGE 4X4 12PLY STRL (GAUZE/BANDAGES/DRESSINGS) IMPLANT
GLOVE SURG SYN 6.5 ES PF (GLOVE) ×24 IMPLANT
GLOVE SURG UNDER LTX SZ7 (GLOVE) ×24 IMPLANT
GLOVE SURG UNDER POLY LF SZ7 (GLOVE) ×24 IMPLANT
GOWN STRL REUS W/ TWL LRG LVL3 (GOWN DISPOSABLE) ×12 IMPLANT
GOWN STRL REUS W/TWL LRG LVL3 (GOWN DISPOSABLE) ×16
KIT OSTOMY 2 PC DRNBL 2.25 STR (WOUND CARE) ×3 IMPLANT
KIT OSTOMY DRAINABLE 2.25 STR (WOUND CARE) ×4
KIT TURNOVER KIT A (KITS) ×4 IMPLANT
LABEL OR SOLS (LABEL) ×4 IMPLANT
LIGASURE IMPACT 36 18CM CVD LR (INSTRUMENTS) ×4 IMPLANT
LIGASURE LAP MARYLAND 5MM 37CM (ELECTROSURGICAL) ×4 IMPLANT
MANIFOLD NEPTUNE II (INSTRUMENTS) ×4 IMPLANT
NEEDLE HYPO 22GX1.5 SAFETY (NEEDLE) ×4 IMPLANT
NS IRRIG 1000ML POUR BTL (IV SOLUTION) ×4 IMPLANT
PACK BASIN MAJOR ARMC (MISCELLANEOUS) ×4 IMPLANT
PACK COLON CLEAN CLOSURE (MISCELLANEOUS) IMPLANT
RELOAD PROXIMATE 75MM BLUE (ENDOMECHANICALS) ×4 IMPLANT
SPONGE T-LAP 18X18 ~~LOC~~+RFID (SPONGE) ×20 IMPLANT
SPONGE T-LAP 18X36 ~~LOC~~+RFID STR (SPONGE) ×4 IMPLANT
STAPLER CVD CUT BL 40 RELOAD (ENDOMECHANICALS) IMPLANT
STAPLER PROXIMATE 75MM BLUE (STAPLE) ×4 IMPLANT
STAPLER SKIN PROX 35W (STAPLE) ×4 IMPLANT
SUT PDS AB 0 CT1 27 (SUTURE) ×4 IMPLANT
SUT PDS AB 1 CT1 27 (SUTURE) ×12 IMPLANT
SUT PDS AB 1 TP1 54 (SUTURE) IMPLANT
SUT PROLENE 0 CT 1 30 (SUTURE) ×4 IMPLANT
SUT SILK 2 0 (SUTURE) ×4
SUT SILK 2 0SH CR/8 30 (SUTURE) ×4 IMPLANT
SUT SILK 2-0 18XBRD TIE 12 (SUTURE) IMPLANT
SUT SILK 2-0 30XBRD TIE 12 (SUTURE) ×3 IMPLANT
SUT SILK 3 0 (SUTURE)
SUT SILK 3-0 (SUTURE) ×4 IMPLANT
SUT SILK 3-0 18XBRD TIE 12 (SUTURE) IMPLANT
SUT VIC AB 2-0 BRD 54 (SUTURE) ×4 IMPLANT
SUT VIC AB 2-0 CT1 27 (SUTURE) ×4
SUT VIC AB 2-0 CT1 TAPERPNT 27 (SUTURE) ×3 IMPLANT
SUT VIC AB 3-0 54X BRD REEL (SUTURE) ×3 IMPLANT
SUT VIC AB 3-0 BRD 54 (SUTURE) ×4
SUT VIC AB 3-0 SH 27 (SUTURE) ×8
SUT VIC AB 3-0 SH 27X BRD (SUTURE) ×6 IMPLANT
SYR 20ML LL LF (SYRINGE) ×8 IMPLANT
SYR 30ML LL (SYRINGE) ×4 IMPLANT
TRAY FOLEY MTR SLVR 16FR STAT (SET/KITS/TRAYS/PACK) ×4 IMPLANT

## 2020-11-16 NOTE — Transfer of Care (Signed)
Immediate Anesthesia Transfer of Care Note  Patient: Jenna [name]  Procedure(s) Performed: EXPLORATORY LAPAROTOMY SUBTOTAL COLECTOMY WITH ILEOSTOMY CREATION (Abdomen) APPLICATION OF WOUND VAC (Abdomen)  Patient Location: PACU  Anesthesia Type:General  Level of Consciousness: awake, drowsy and patient cooperative  Airway & Oxygen Therapy: Patient Spontanous Breathing and Patient connected to face mask oxygen  Post-op Assessment: Report given to RN and Post -op Vital signs reviewed and stable  Post vital signs: Reviewed and stable  Last Vitals:  Vitals Value Taken Time  BP    Temp    Pulse 98 11/16/20 1607  Resp 17 11/16/20 1607  SpO2 100 % 11/16/20 1607  Vitals shown include unvalidated device data.  Last Pain:  Vitals:   11/16/20 1203  TempSrc: Oral  PainSc: 9          Complications: No notable events documented.

## 2020-11-16 NOTE — Anesthesia Preprocedure Evaluation (Addendum)
Anesthesia Evaluation  Patient identified by MRN, date of birth, ID band Patient awake    Reviewed: Allergy & Precautions, NPO status , Patient's Chart, lab work & pertinent test results  History of Anesthesia Complications Negative for: history of anesthetic complications  Airway Mallampati: III  TM Distance: >3 FB Neck ROM: Full    Dental  (+) Poor Dentition   Pulmonary neg sleep apnea, neg COPD, Current Smoker and Patient abstained from smoking.,    breath sounds clear to auscultation- rhonchi (-) wheezing      Cardiovascular hypertension, Pt. on medications (-) CAD, (-) Past MI, (-) Cardiac Stents and (-) CABG  Rhythm:Regular Rate:Normal - Systolic murmurs and - Diastolic murmurs    Neuro/Psych neg Seizures CVA, No Residual Symptoms negative psych ROS   GI/Hepatic Neg liver ROS, Likely bowel obstruction   Endo/Other  neg diabetesHypothyroidism   Renal/GU CRFRenal disease     Musculoskeletal negative musculoskeletal ROS (+)   Abdominal (+) - obese,   Peds  Hematology negative hematology ROS (+)   Anesthesia Other Findings Past Medical History: 03/2020: BRCA negative     Comment:  MyRisk neg except MSH3 VUS No date: Family history of breast cancer     Comment:  IBIS=4.4%/riskscore=2.9% No date: Gout No date: Hyperlipidemia No date: Hypertension 05/2019: Stroke ([place]) No date: Thyroid disease   Reproductive/Obstetrics                            Anesthesia Physical Anesthesia Plan  ASA: 3 and emergent  Anesthesia Plan: General   Post-op Pain Management:    Induction: Intravenous and Rapid sequence  PONV Risk Score and Plan: 1 and Ondansetron and Dexamethasone  Airway Management Planned: Oral ETT  Additional Equipment:   Intra-op Plan:   Post-operative Plan: Extubation in OR and Possible Post-op intubation/ventilation  Informed Consent: I have reviewed the patients  History and Physical, chart, labs and discussed the procedure including the risks, benefits and alternatives for the proposed anesthesia with the patient or authorized representative who has indicated his/her understanding and acceptance.     Dental advisory given  Plan Discussed with: CRNA and Anesthesiologist  Anesthesia Plan Comments:         Anesthesia Quick Evaluation

## 2020-11-16 NOTE — Op Note (Signed)
Operative Note  Preoperative Diagnosis: Diverticulitis versus obstructing colon mass  Postoperative Diagnosis: Proximal descending colon mass, perforated cecum  Operation: Exploratory laparotomy, mobilization of the splenic flexure, subtotal colectomy, end ileostomy, wound VAC placement measuring 24 cm long by 5 cm wide by 3.5 cm deep  Surgeon: Fredirick Maudlin, MD  Assistant: Edison Simon, PA-C (Mr. Schulz's assistance was necessary due to the technical difficulty of the case and need for exposure, as well as lack of other qualified assistant)  Anesthesia: General endotracheal  Findings: The colon was dilated proximal to the obstructing mass.  The cecum was gangrenous and had already perforated, spilling feculent material into the abdomen causing frank contamination.  There was extensive foul-smelling fluid throughout the abdomen and pelvis.  The liver was palpated and no masses were appreciated.  There was no peritoneal studding nor any gross mesenteric nodal involvement.  Indications: This is a 80 year old woman who developed onset of severe abdominal pain over the weekend.  She presented to the emergency room and was admitted to the hospital medicine service with a presumed diagnosis of diverticulitis, although her admission CT scan raised the question of a possible colon mass versus developing abscess.  She continued to have abdominal pain which worsened this morning.  General surgery was consulted.  A plain abdominal x-ray demonstrated massive dilation of the cecum and taking into account her physical exam findings of peritonitis, the decision was made to take her emergently to the operating room for exploratory laparotomy.  The risks of the procedure were discussed with the patient and her family in detail and they agreed to proceed.  Procedure In Detail: The patient was brought to the operating room and transferred to the OR table.  Bony prominences were padded and bilateral sequential  compression devices were placed on the lower extremities.  General endotracheal anesthesia was induced without incident.  A Foley catheter was aseptically placed.  The patient was positioned appropriately for the operation and sterilely prepped and draped in standard fashion.  A timeout was performed confirming the patient's identity, the procedure being performed, her allergies, all necessary equipment was available, and that maintenance anesthesia was adequate.  She was receiving scheduled antibiotics on the floor and therefore no additional prophylaxis was required.  A midline incision was made from just below the xiphoid to just above the pubis.  This was carried down through to the fascia using fat splitting technique.  The fascia was opened and the peritoneum entered.  No adhesions were appreciated.  A foul odor emanated immediately upon opening the peritoneum.  The incision was completely opened revealing a massively dilated necrotic appearing cecum that was oozing stool from several perforation sites.  There was also purulent fluid in the pelvis which was sampled for culture.  Lembert figure-of-eight sutures were used to close the perforation sites to prevent further contamination during the surgery.  The abdomen was explored after dividing the falciform ligament.  No masses were appreciated on the surface of the liver and then could be palpated.  There was stool staining on the spleen, but no other abnormality seen in that location.  No peritoneal studding was identified.  The small bowel was run from ligament of Treitz to terminal ileum and appeared to be completely normal.  In the descending colon, just distal to the splenic flexure, however a firm mass was appreciated with puckering of the tissue consistent with a likely primary colon cancer.  The bowel was dilated proximal to this obstruction.  No diverticulitis was appreciated  and the sigmoid colon was soft and pliable down to the sacral promontory.   Based upon the findings, we elected to perform a subtotal colectomy, leaving a substantial portion of the sigmoid behind for possible future reanastomosis.  We began by mobilizing the descending colon along the peritoneal reflection.  We continued this up towards the spleen and carefully mobilized the splenic flexure.  The lesser sac was entered and the omentum dissected off of the transverse colon.  We identified an area of the proximal sigmoid colon roughly 20 cm from the mass and elected to divide the colon there.  A window was made in the mesentery and a 75 mm blue load GIA stapler was used to divide the bowel.  We then performed an oncologic resection taking the mesentery at its root using a combination of electrocautery, LigaSure, and Kelly clamps and ties to divide the mesentery.  We continued this around past the middle colic artery.  At that point, I began working from the cecum toward the transverse.  The cecum was mobilized off the retroperitoneum and the white line of Toldt used to carry the dissection towards the hepatic flexure.  The tissue planes were easily identified and divided.  As the hepatic flexure was mobilized, we took care to avoid the duodenum and disrupting any of the small bowel mesentery.  A window was made in the mesentery of the terminal ileum, just proximal to the cecum.  Another firing of the GIA was used to divide the bowel in this location.  We continued to divide the mesentery, once again using blunt dissection, electrocautery, the LigaSure, and clamps and silk ties.  The specimen was completely excised and handed off to be sent to pathology.  I observed the terminal ileum and sigmoid remnant and determined that both had good blood flow based on their visual appearance.  The sigmoid was marked with 2 Prolene sutures.  We then irrigated the abdomen copiously with 4 L of warm saline.  A small disc of skin was then excised in the right lower abdomen.  This was carried down through  the fascia which was then opened in a cruciate fashion.  The opening was dilated to admit to of the operating surgeon's fingers.  The terminal ileum was grasped with a Babcock clamp and brought up through the opening to create the ileostomy.  Care was taken to avoid twisting of the mesentery and there was no tension applied to the bowel.  The fascia was then closed with #1 PDS suture using small bites technique.  The ileostomy was matured in standard Davis fashion.  Due to the gross abdominal contamination, we elected not to close the skin and instead placed a wound VAC in the midline.  A good seal was obtained and an ostomy appliance was secured to the skin.  The patient was then awakened, extubated, and taken to the postanesthesia care unit in good condition.  EBL: Less than 20 cc  IVF: See anesthesia record  Specimen(s): Subtotal colectomy  Complications: none immediately apparent.   Counts: all needles, instruments, and sponges were counted and reported to be correct in number at the end of the case.   I was present for and participated in the entire operation.  Fredirick Maudlin 4:31 PM

## 2020-11-16 NOTE — Anesthesia Procedure Notes (Signed)
Procedure Name: Intubation Date/Time: 11/16/2020 12:48 PM Performed by: Kelton Pillar, CRNA Pre-anesthesia Checklist: Patient identified, Emergency Drugs available, Suction available and Patient being monitored Patient Re-evaluated:Patient Re-evaluated prior to induction Oxygen Delivery Method: Circle system utilized Preoxygenation: Pre-oxygenation with 100% oxygen Induction Type: IV induction Ventilation: Mask ventilation without difficulty Laryngoscope Size: McGraph and 3 Grade View: Grade I Tube type: Oral Tube size: 6.5 mm Number of attempts: 1 Airway Equipment and Method: Stylet and Oral airway Placement Confirmation: ETT inserted through vocal cords under direct vision, positive ETCO2, breath sounds checked- equal and bilateral and CO2 detector Secured at: 21 cm Tube secured with: Tape Dental Injury: Teeth and Oropharynx as per pre-operative assessment

## 2020-11-16 NOTE — Progress Notes (Signed)
PROGRESS NOTE    Jenna [name]  XFG:[id number] DOB: 08-14-1940 DOA: 11/14/2020 PCP: Perrin Maltese, MD   Brief Narrative:  80 y.o. female with medical history significant for history of acute cardioembolic stroke in February 2021 in the left temporal parietal junction, with punctate acute infarction in the right parietal lobe, 11 mm meningioma, hypothyroid, hyperlipidemia, hypertension, CKD 3A, history of gout, presents emergency department for chief concerns of abdominal pain that started on 11/12/2020.   She describes the abdominal pain as sharp, 10 out of 10, comes and goes, lasting 2-3 minutes, and that standing up makes it go away. The pain started when she was laying in bed and standing up relieved the pain. She endorses nausea and vomiting, 6x at home and once in the ED. The vomitus was yellow. She reports the pain is similar to having gass only more extensive.    Her last PO intake was last evening, bacon and eggs.    She reports she has not had a meaningful bowel movements in several days and that most of her stool has been watery and or pencil thin. She denies weight loss.   CT abdomen pelvis significant for acute diverticulitis with at least partial associated obstruction and 1.3cm density concerning for abscess versus mass.  8/15: Patient more tender this morning and specially on the right side.  General surgery consulted.  Physical exam concerning for possible ischemia versus impending perforation.  Stat KUB this morning with colonic dilatation specifically at the cecum.  Taken to the operating room emergently for exploratory laparotomy.   Assessment & Plan:   Principal Problem:   Diverticulitis large intestine Active Problems:   CVA (cerebral vascular accident) ([place])   Essential hypertension   Hyperlipidemia   Hypothyroidism   Stage 3a chronic kidney disease ([place])   Abdominal pain   Constipation   Colonic mass   Diverticulitis  Acute diverticulitis Possible  obstruction Possible abscess versus mass 1.3 cm Patient has a week of worsening abdominal pain Irregular bowel habits, inability to tolerate p.o. General surgery consulted 8/15 To OR emergently for ex lap Plan: N.p.o. Continue IV antibiotics Further management pending exploratory laparotomy  History of hypertension Hypotensive in the ED suspected his pain medication Will continue IVF  History of CVA PTA aspirin  Hypothyroidism Synthroid 50 mcg  Depression/anxiety Wellbutrin 50 mg daily  Hyperlipidemia PTA statin  CKD stage IIIa Creatinine baseline  History of meningioma Outpatient follow-up  History of gout No signs of flare Allopurinol 100 mg twice daily   DVT prophylaxis: SQ heparin Code Status: Full Family Communication: Husband at bedside 8/14 Disposition Plan: Status is: Observation  The patient will require care spanning > 2 midnights and should be moved to inpatient because: Inpatient level of care appropriate due to severity of illness  Dispo: The patient is from: Home              Anticipated d/c is to: Home              Patient currently is not medically stable to d/c.   Difficult to place patient No       Level of care: Med-Surg  Consultants:  None  Procedures:  None  Antimicrobials:  Ceftriaxone Metronidazole   Subjective: Seen and examined.  Increasing abdominal pain this morning  Objective: Vitals:   11/16/20 0547 11/16/20 0742 11/16/20 1137 11/16/20 1203  BP: 137/65 135/66 (!) 150/76 (!) 160/75  Pulse: 81 77 80 89  Resp: 17 16 17  14  Temp: 99 F (37.2 C) 98.6 F (37 C) (!) 97.1 F (36.2 C) 99.3 F (37.4 C)  TempSrc:    Oral  SpO2: 93% 95% 95% 97%  Weight:      Height:        Intake/Output Summary (Last 24 hours) at 11/16/2020 1225 Last data filed at 11/15/2020 1403 Gross per 24 hour  Intake 480 ml  Output --  Net 480 ml   Filed Weights   11/14/20 1151  Weight: 77.1 kg    Examination:  General exam: Mild  abdominal discomfort Respiratory system: Clear to auscultation. Respiratory effort normal. Cardiovascular system: S1-S2, regular rate and rhythm, no murmurs, no pedal edema Gastrointestinal system: Distended, tender to palpation Central nervous system: Alert and oriented. No focal neurological deficits. Extremities: Symmetric 5 x 5 power. Skin: No rashes, lesions or ulcers Psychiatry: Judgement and insight appear normal. Mood & affect appropriate.     Data Reviewed: I have personally reviewed following labs and imaging studies  CBC: Recent Labs  Lab 11/14/20 1153 11/15/20 0528 11/16/20 0614  WBC 9.1 8.0 11.4*  NEUTROABS  --   --  9.4*  HGB 13.0 11.0* 11.3*  HCT 38.4 33.1* 33.6*  MCV 90.8 92.2 89.4  PLT 254 226 962   Basic Metabolic Panel: Recent Labs  Lab 11/14/20 1153 11/15/20 0528 11/16/20 0614  NA 138 137 135  K 3.9 3.3* 3.6  CL 105 105 104  CO2 22 22 22   GLUCOSE 135* 109* 127*  BUN 17 16 15   CREATININE 0.98 0.95 0.91  CALCIUM 9.3 8.4* 8.5*   GFR: Estimated Creatinine Clearance: 52.1 mL/min (by C-G formula based on SCr of 0.91 mg/dL). Liver Function Tests: Recent Labs  Lab 11/14/20 1153  AST 16  ALT 9  ALKPHOS 50  BILITOT 0.9  PROT 7.5  ALBUMIN 4.2   Recent Labs  Lab 11/14/20 1153  LIPASE 28   No results for input(s): AMMONIA in the last 168 hours. Coagulation Profile: No results for input(s): INR, PROTIME in the last 168 hours. Cardiac Enzymes: No results for input(s): CKTOTAL, CKMB, CKMBINDEX, TROPONINI in the last 168 hours. BNP (last 3 results) No results for input(s): PROBNP in the last 8760 hours. HbA1C: No results for input(s): HGBA1C in the last 72 hours. CBG: No results for input(s): GLUCAP in the last 168 hours. Lipid Profile: No results for input(s): CHOL, HDL, LDLCALC, TRIG, CHOLHDL, LDLDIRECT in the last 72 hours. Thyroid Function Tests: No results for input(s): TSH, T4TOTAL, FREET4, T3FREE, THYROIDAB in the last 72  hours. Anemia Panel: No results for input(s): VITAMINB12, FOLATE, FERRITIN, TIBC, IRON, RETICCTPCT in the last 72 hours. Sepsis Labs: Recent Labs  Lab 11/16/20 1058  LATICACIDVEN 1.4    Recent Results (from the past 240 hour(s))  Resp Panel by RT-PCR (Flu A&B, Covid) Nasopharyngeal Swab     Status: None   Collection Time: 11/14/20  3:46 PM   Specimen: Nasopharyngeal Swab; Nasopharyngeal(NP) swabs in vial transport medium  Result Value Ref Range Status   SARS Coronavirus 2 by RT PCR NEGATIVE NEGATIVE Final    Comment: (NOTE) SARS-CoV-2 target nucleic acids are NOT DETECTED.  The SARS-CoV-2 RNA is generally detectable in upper respiratory specimens during the acute phase of infection. The lowest concentration of SARS-CoV-2 viral copies this assay can detect is 138 copies/mL. A negative result does not preclude SARS-Cov-2 infection and should not be used as the sole basis for treatment or other patient management decisions. A negative result may occur with  improper specimen collection/handling, submission of specimen other than nasopharyngeal swab, presence of viral mutation(s) within the areas targeted by this assay, and inadequate number of viral copies(<138 copies/mL). A negative result must be combined with clinical observations, patient history, and epidemiological information. The expected result is Negative.  Fact Sheet for Patients:  EntrepreneurPulse.com.au  Fact Sheet for Healthcare Providers:  IncredibleEmployment.be  This test is no t yet approved or cleared by the [place] FDA and  has been authorized for detection and/or diagnosis of SARS-CoV-2 by FDA under an Emergency Use Authorization (EUA). This EUA will remain  in effect (meaning this test can be used) for the duration of the COVID-19 declaration under Section 564(b)(1) of the Act, 21 U.S.C.section 360bbb-3(b)(1), unless the authorization is terminated  or revoked  sooner.       Influenza A by PCR NEGATIVE NEGATIVE Final   Influenza B by PCR NEGATIVE NEGATIVE Final    Comment: (NOTE) The Xpert Xpress SARS-CoV-2/FLU/RSV plus assay is intended as an aid in the diagnosis of influenza from Nasopharyngeal swab specimens and should not be used as a sole basis for treatment. Nasal washings and aspirates are unacceptable for Xpert Xpress SARS-CoV-2/FLU/RSV testing.  Fact Sheet for Patients: EntrepreneurPulse.com.au  Fact Sheet for Healthcare Providers: IncredibleEmployment.be  This test is not yet approved or cleared by the [place] FDA and has been authorized for detection and/or diagnosis of SARS-CoV-2 by FDA under an Emergency Use Authorization (EUA). This EUA will remain in effect (meaning this test can be used) for the duration of the COVID-19 declaration under Section 564(b)(1) of the Act, 21 U.S.C. section 360bbb-3(b)(1), unless the authorization is terminated or revoked.  Performed at [facility], [address], [place], [place] [address]          Radiology Studies: DG Abd 1 View  Result Date: 11/16/2020 CLINICAL DATA:  Small bowel obstruction. EXAM: ABDOMEN - 1 VIEW COMPARISON:  November 14, 2020. FINDINGS: No small bowel dilatation is noted. Dilatation of ascending and transverse colon is noted again concerning for distal colonic obstruction. No radio-opaque calculi or other significant radiographic abnormality are seen. IMPRESSION: Dilatation of ascending and transverse colon is noted again as described on prior CT scan a concerning for distal colonic obstruction. Electronically Signed   By: Marijo Conception M.D.   On: 11/16/2020 11:43   CT ABDOMEN PELVIS W CONTRAST  Result Date: 11/14/2020 CLINICAL DATA:  Intermittent lower abdominal pain, constipation, emesis. EXAM: CT ABDOMEN AND PELVIS WITH CONTRAST TECHNIQUE: Multidetector CT imaging of the abdomen and pelvis was performed using  the standard protocol following bolus administration of intravenous contrast. CONTRAST:  8mL OMNIPAQUE IOHEXOL 350 MG/ML SOLN COMPARISON:  CT abdomen pelvis dated 09/06/2009. FINDINGS: Lower chest: No acute abnormality. Hepatobiliary: No focal liver abnormality is seen. Status post cholecystectomy. No biliary dilatation. Pancreas: Unremarkable. No pancreatic ductal dilatation or surrounding inflammatory changes. Spleen: Normal in size without focal abnormality. Adrenals/Urinary Tract: Adrenal glands are unremarkable. Other than a 1.2 cm right renal cyst, the kidneys are normal, without renal calculi, focal lesion, or hydronephrosis. Bladder is unremarkable. Stomach/Bowel: Stomach is within normal limits. Colonic diverticulosis is noted. There is bowel wall thickening and associated inflammatory changes of the descending colon. Soft tissue density along the medial aspect of the descending colon (series 2 image [id number] and series 8, image 56) measures 1.3 cm and may represent a developing abscess or colonic mass. There is distension of the colon proximal to this location, consistent with at least some degree  of obstruction at this point. Vascular/Lymphatic: Aortic atherosclerosis. No enlarged abdominal or pelvic lymph nodes. Reproductive: Status post hysterectomy. No adnexal masses. Other: No abdominal wall hernia or abnormality. No abdominopelvic ascites. Musculoskeletal: The patient is status post a left hip arthroplasty. Degenerative changes are seen in the spine. IMPRESSION: Acute diverticulitis involving the descending colon results in at least some degree of bowel obstruction. Soft tissue density in this area may represent a developing abscess or colonic mass. Colonoscopy is recommended to evaluate for an underlying colonic mass after the patient's acute condition resolves. Electronically Signed   By: Zerita Boers M.D.   On: 11/14/2020 14:49        Scheduled Meds:  [MAR Hold] allopurinol  100 mg Oral  Daily   [MAR Hold] aspirin  81 mg Oral Daily   [MAR Hold] heparin  5,000 Units Subcutaneous Q8H   [MAR Hold] ketorolac  15 mg Intravenous Q6H   [MAR Hold] levothyroxine  50 mcg Oral Q0600   [MAR Hold] rosuvastatin  40 mg Oral QHS   [MAR Hold] simethicone  80 mg Oral QID   Continuous Infusions:  [MAR Hold] cefTRIAXone (ROCEPHIN)  IV 2 g (11/15/20 1646)   lactated ringers 100 mL/hr at 11/16/20 0754     LOS: 1 day    Time spent: 25 minutes    Sidney Ace, MD [facility] Pager [phone]  If 7PM-7AM, please contact night-coverage 11/16/2020, 12:25 PM

## 2020-11-16 NOTE — Progress Notes (Signed)
Physical exam concerning for peritonitis and KUB shows marked colonic dilation, particularly at the cecum.  I feel Jenna [name] needs emergent exploratory laparotomy due to concern for possible ischemia versus impending perforation.

## 2020-11-16 NOTE — Consult Note (Addendum)
[facility] SURGICAL CONSULTATION NOTE (initial) - cpt: 77373   HISTORY OF PRESENT ILLNESS (HPI):  80 y.o. female presented to [facility] ED initially on 08/13 for evaluation of abdominal pain. Patient reported around a 48 hour history of intermittent crampy abdominal pain prior to her presentation. However, this ultimately became a more sharp pain. At the worst, this was 10/10. She reported that standing up would give her some relief. Endorses associated nausea and emesis. She does not feel her bowel have moved recently either. No fever, chills, cough, CP, SOB, or urinary changes. No history of similar. Only previous intra-abdominal surgery is cholecystectomy. Unsure when he last colonoscopy was. Laboratory work up in the ED was relatively reassuring; however, on CT Abdomen/Pelvis there is concern for descending colon diverticulitis with possible abscess vs mass and associated proximal colonic dilation. She was admitted to the medicine service and started on rocephin and Flagyl.   This morning, she continues to endorse somewhat generalized abdominal pain but seems worse in the lower and right abdomen. She remains distended with intermittent nausea.   Surgery is consulted by hospitalist physician Dr. Ralene Muskrat, MD in this context for evaluation and management of sigmoid diverticulitis with possible mass vs abscess.  PAST MEDICAL HISTORY (PMH):  Past Medical History:  Diagnosis Date   BRCA negative 03/2020   MyRisk neg except MSH3 VUS   Family history of breast cancer    IBIS=4.4%/riskscore=2.9%   Gout    Hyperlipidemia    Hypertension    Stroke ([place]) 05/2019   Thyroid disease      PAST SURGICAL HISTORY ([place]):  Past Surgical History:  Procedure Laterality Date   BREAST BIOPSY Right 1978   neg   CHOLECYSTECTOMY       MEDICATIONS:  Prior to Admission medications   Medication Sig Start Date End Date Taking? Authorizing Provider  allopurinol (ZYLOPRIM) 100 MG tablet  Take 100 mg by mouth every evening.   Yes [provider]  aspirin EC 81 MG tablet Take 81 mg by mouth every evening.   Yes [provider]  Calcium-Magnesium-Zinc [phone] MG TABS Take 1 tablet by mouth daily.   Yes [provider]  fenofibrate 54 MG tablet Take 54 mg by mouth every evening.   Yes [provider]  levothyroxine (SYNTHROID) 50 MCG tablet Take 50 mcg by mouth daily. 04/30/19  Yes [provider]  Multiple Vitamin (MULTIVITAMIN) tablet Take 1 tablet by mouth daily.   Yes [provider]  rosuvastatin (CRESTOR) 40 MG tablet Take 40 mg by mouth every evening.   Yes [provider]     ALLERGIES:  Allergies  Allergen Reactions   Nicotine Rash     SOCIAL HISTORY:  Social History   Socioeconomic History   Marital status: Married    Spouse name: Not on file   Number of children: Not on file   Years of education: Not on file   Highest education level: Not on file  Occupational History   Not on file  Tobacco Use   Smoking status: Every Day    Packs/day: 0.25    Types: Cigarettes   Smokeless tobacco: Never   Tobacco comments:    smoking 4-5 cigarettes daily.   Vaping Use   Vaping Use: Never used  Substance and Sexual Activity   Alcohol use: No   Drug use: No   Sexual activity: Yes    Birth control/protection: Post-menopausal  Other Topics Concern   Not on file  Social  History Narrative   Not on file   Social Determinants of Health   Financial Resource Strain: Not on file  Food Insecurity: Not on file  Transportation Needs: Not on file  Physical Activity: Not on file  Stress: Not on file  Social Connections: Not on file  Intimate Partner Violence: Not on file     FAMILY HISTORY:  Family History  Problem Relation Age of Onset   Breast cancer Maternal Grandmother 53   Breast cancer Cousin 40      REVIEW OF SYSTEMS:  Review of Systems  Constitutional:  Negative for chills and fever.   HENT:  Negative for congestion and sore throat.   Respiratory:  Negative for hemoptysis and shortness of breath.   Cardiovascular:  Negative for chest pain and palpitations.  Gastrointestinal:  Positive for abdominal pain, nausea and vomiting. Negative for diarrhea.  Genitourinary:  Negative for dysuria and urgency.  All other systems reviewed and are negative.  VITAL SIGNS:  Temp:  [97.9 F (36.6 C)-101 F (38.3 C)] 98.6 F (37 C) (08/15 0742) Pulse Rate:  [73-81] 77 (08/15 0742) Resp:  [16-20] 16 (08/15 0742) BP: (119-137)/(59-66) 135/66 (08/15 0742) SpO2:  [93 %-97 %] 95 % (08/15 0742)     Height: 5' 4"  (162.6 cm) Weight: 77.1 kg BMI (Calculated): 29.17   INTAKE/OUTPUT:  08/14 0701 - 08/15 0700 In: 720 [P.O.:720] Out: -   PHYSICAL EXAM:  Physical Exam Vitals and nursing note reviewed. Exam conducted with a chaperone present.  Constitutional:      General: She is not in acute distress.    Appearance: She is well-developed. She is not ill-appearing.  HENT:     Head: Normocephalic and atraumatic.  Eyes:     Extraocular Movements: Extraocular movements intact.  Cardiovascular:     Rate and Rhythm: Normal rate and regular rhythm.     Heart sounds: Normal heart sounds. No murmur heard. Pulmonary:     Effort: Pulmonary effort is normal. No respiratory distress.  Abdominal:     General: Abdomen is protuberant. There is distension.     Tenderness: There is abdominal tenderness in the right lower quadrant and suprapubic area. There is no guarding or rebound.     Comments: Abdomen is firm, markedly distended, she is tender worse in RLQ and suprapubic region  Genitourinary:    Comments: Deferred Skin:    General: Skin is warm and dry.     Coloration: Skin is not jaundiced or pale.  Neurological:     General: No focal deficit present.     Mental Status: She is alert and oriented to person, place, and time.  Psychiatric:        Mood and Affect: Mood normal.        Behavior:  Behavior normal.     Labs:  CBC Latest Ref Rng & Units 11/16/2020 11/15/2020 11/14/2020  WBC 4.0 - 10.5 K/uL 11.4(H) 8.0 9.1  Hemoglobin 12.0 - 15.0 g/dL 11.3(L) 11.0(L) 13.0  Hematocrit 36.0 - 46.0 % 33.6(L) 33.1(L) 38.4  Platelets 150 - 400 K/uL 215 226 254   CMP Latest Ref Rng & Units 11/16/2020 11/15/2020 11/14/2020  Glucose 70 - 99 mg/dL 127(H) 109(H) 135(H)  BUN 8 - 23 mg/dL 15 16 17   Creatinine 0.44 - 1.00 mg/dL 0.91 0.95 0.98  Sodium 135 - 145 mmol/L 135 137 138  Potassium 3.5 - 5.1 mmol/L 3.6 3.3(L) 3.9  Chloride 98 - 111 mmol/L 104 105 105  CO2 22 - 32 mmol/L  22 22 22   Calcium 8.9 - 10.3 mg/dL 8.5(L) 8.4(L) 9.3  Total Protein 6.5 - 8.1 g/dL - - 7.5  Total Bilirubin 0.3 - 1.2 mg/dL - - 0.9  Alkaline Phos 38 - 126 U/L - - 50  AST 15 - 41 U/L - - 16  ALT 0 - 44 U/L - - 9     Imaging studies:   CT Abdomen/Pelvis (11/14/2020) personally reviewed which showed inflammatory changes of the descending colon with possible mass vs abscess with colonic distension, and radiologist report reviewed below:  IMPRESSION: Acute diverticulitis involving the descending colon results in at least some degree of bowel obstruction. Soft tissue density in this area may represent a developing abscess or colonic mass. Colonoscopy is recommended to evaluate for an underlying colonic mass after the patient's acute condition resolves.   Assessment/Plan: (ICD-10's: K32.32) 80 y.o. female with abdominal pain/distension found to have descending colon diverticulitis with possible mass vs abscess; however, presentation and imaging appear more consistent with possible mass and resulting/developing large bowel obstructions    - NPO for now - Agree with repeat KUB this morning; pending - Given her degree of distension/tenderness, will add lactic acid level to her work up   - Pending the above, she may need surgical intervention (ex: Hartman's). We will follow her closely today/tomorrow. She and her family  at understanding of this.   - Unsure if NGT decompression will be of much benefit as majority of her dilation is in proximal colon  - GI consultation can be considered as she will need colonoscopy once recovered from this acute incident; no role for colonoscopy in setting of acute diverticulitis due to risk outweighing benefits  - Continue Abx (Rocephin) - Pain control prn; antiemetics prn - Monitor abdominal examination; on-going bowel function  - Further management per primary service   All of the above findings and recommendations were discussed with the patient and her family, and all of their questions were answered to their expressed satisfaction.  Thank you for the opportunity to participate in this patient's care.   -- Edison Simon, PA-C [facility] 11/16/2020, 9:51 AM [phone] M-F: 7am - 4pm  I saw and evaluated the patient.  I agree with the above documentation, exam, and plan, which I have edited where appropriate. Fredirick Maudlin  10:59 AM

## 2020-11-16 NOTE — Anesthesia Postprocedure Evaluation (Signed)
Anesthesia Post Note  Patient: CLARINE ELROD  Procedure(s) Performed: EXPLORATORY LAPAROTOMY SUBTOTAL COLECTOMY WITH ILEOSTOMY CREATION (Abdomen) APPLICATION OF WOUND VAC (Abdomen)  Patient location during evaluation: PACU Anesthesia Type: General Level of consciousness: awake and alert Pain management: pain level controlled Vital Signs Assessment: post-procedure vital signs reviewed and stable Respiratory status: spontaneous breathing, nonlabored ventilation, respiratory function stable and patient connected to nasal cannula oxygen Cardiovascular status: blood pressure returned to baseline and stable Postop Assessment: no apparent nausea or vomiting Anesthetic complications: no   No notable events documented.   Last Vitals:  Vitals:   11/16/20 1630 11/16/20 1637  BP: 99/61 126/62  Pulse: 96 100  Resp: 15 14  Temp: 36.9 C   SpO2: 93% 93%    Last Pain:  Vitals:   11/16/20 1630  TempSrc:   PainSc: 0-No pain                 Molli Barrows

## 2020-11-17 ENCOUNTER — Encounter: Payer: Self-pay | Admitting: General Surgery

## 2020-11-17 LAB — CBC
HCT: 29.8 % — ABNORMAL LOW (ref 36.0–46.0)
Hemoglobin: 10 g/dL — ABNORMAL LOW (ref 12.0–15.0)
MCH: 30.6 pg (ref 26.0–34.0)
MCHC: 33.6 g/dL (ref 30.0–36.0)
MCV: 91.1 fL (ref 80.0–100.0)
Platelets: 176 10*3/uL (ref 150–400)
RBC: 3.27 MIL/uL — ABNORMAL LOW (ref 3.87–5.11)
RDW: 13.5 % (ref 11.5–15.5)
WBC: 6.2 10*3/uL (ref 4.0–10.5)
nRBC: 0 % (ref 0.0–0.2)

## 2020-11-17 LAB — BASIC METABOLIC PANEL
Anion gap: 7 (ref 5–15)
BUN: 17 mg/dL (ref 8–23)
CO2: 21 mmol/L — ABNORMAL LOW (ref 22–32)
Calcium: 8.1 mg/dL — ABNORMAL LOW (ref 8.9–10.3)
Chloride: 105 mmol/L (ref 98–111)
Creatinine, Ser: 1.2 mg/dL — ABNORMAL HIGH (ref 0.44–1.00)
GFR, Estimated: 47 mL/min — ABNORMAL LOW (ref >60)
Glucose, Bld: 142 mg/dL — ABNORMAL HIGH (ref 70–99)
Potassium: 4 mmol/L (ref 3.5–5.1)
Sodium: 133 mmol/L — ABNORMAL LOW (ref 135–145)

## 2020-11-17 LAB — MAGNESIUM: Magnesium: 1.8 mg/dL (ref 1.7–2.4)

## 2020-11-17 LAB — PHOSPHORUS: Phosphorus: 4.4 mg/dL (ref 2.5–4.6)

## 2020-11-17 LAB — CEA: CEA: 12 ng/mL — ABNORMAL HIGH (ref 0.0–4.7)

## 2020-11-17 MED ORDER — CALCIUM CARBONATE ANTACID 500 MG PO CHEW
400.0000 mg | CHEWABLE_TABLET | Freq: Three times a day (TID) | ORAL | Status: DC
  Administered 2020-11-17 – 2020-11-19 (×4): 400 mg via ORAL
  Filled 2020-11-17 (×7): qty 2

## 2020-11-17 MED ORDER — PANTOPRAZOLE SODIUM 40 MG IV SOLR
40.0000 mg | INTRAVENOUS | Status: DC
  Administered 2020-11-17 – 2020-11-20 (×4): 40 mg via INTRAVENOUS
  Filled 2020-11-17 (×4): qty 40

## 2020-11-17 MED ORDER — GUAIFENESIN ER 600 MG PO TB12: 600.0000 mg | ORAL_TABLET | Freq: Two times a day (BID) | ORAL | Status: DC | PRN

## 2020-11-17 NOTE — Care Management (Signed)
Patient initially mated to the hospitalist service.  General surgery was consulted 8/15 given concern for worsening abdominal pain, obstruction, signs of peritonitis.  Patient was taken to operating room and findings were concerning for obstructive mass.  Preoperative KUB demonstrating cecal enlargement.  Per general surgery cecal perforation was noted in the operating room.  Primary care transferred to general surgery service.  [facility] hospitalist service to sign off at this time.  Please reach out with any questions.  Ralene Muskrat MD

## 2020-11-17 NOTE — Progress Notes (Addendum)
[place] Hospital Day(s): 2.   Post op day(s): 1 Day Post-Op.   Interval History:  Patient seen and examined No acute events or new complaints overnight.  Patient reports she is feeling much better this morning; some soreness but overall markedly improved from presentation Some complaints of reflux No fever, chills, emesis  Leukocytosis resolved, WBC now 6.2K this morning Hgb to 10.2; likely dilutional component Slight bump in sCr to 1.20 (baseline 0.90 - 1.10); UO - 550 ccs Ileostomy with 150 ccs out recorded  She is currently NPO Continues on Rocephin and Flagyl   Vital signs in last 24 hours: [min-max] current  Temp:  [97.1 F (36.2 C)-99.3 F (37.4 C)] 98 F (36.7 C) (08/16 0451) Pulse Rate:  [71-100] 71 (08/16 0451) Resp:  [14-20] 20 (08/16 0451) BP: (99-160)/(61-76) 102/64 (08/16 0451) SpO2:  [93 %-100 %] 96 % (08/16 0451)     Height: 5\' 4"  (162.6 cm) Weight: 77.1 kg BMI (Calculated): 29.17   Intake/Output last 2 shifts:  08/15 0701 - 08/16 0700 In: 1350 [I.V.:1100; IV Piggyback:250] Out: 025 [Urine:550; Stool:150; Blood:75]   Physical Exam:  Constitutional: alert, cooperative and no distress  Respiratory: breathing non-labored at rest  Cardiovascular: regular rate and sinus rhythm  Gastrointestinal: soft, non-tender, non-distended, no rebound/guarding. Ileostomy in the RLQ which is slightly dusky, dark liquid stool in bag Integumentary: Midline wound with wound vac, good seal  Labs:  CBC Latest Ref Rng & Units 11/17/2020 11/16/2020 11/15/2020  WBC 4.0 - 10.5 K/uL 6.2 11.4(H) 8.0  Hemoglobin 12.0 - 15.0 g/dL 10.0(L) 11.3(L) 11.0(L)  Hematocrit 36.0 - 46.0 % 29.8(L) 33.6(L) 33.1(L)  Platelets 150 - 400 K/uL 176 215 226   CMP Latest Ref Rng & Units 11/17/2020 11/16/2020 11/15/2020  Glucose 70 - 99 mg/dL 142(H) 127(H) 109(H)  BUN 8 - 23 mg/dL 17 15 16   Creatinine 0.44 - 1.00 mg/dL 1.20(H) 0.91 0.95  Sodium 135 - 145 mmol/L  133(L) 135 137  Potassium 3.5 - 5.1 mmol/L 4.0 3.6 3.3(L)  Chloride 98 - 111 mmol/L 105 104 105  CO2 22 - 32 mmol/L 21(L) 22 22  Calcium 8.9 - 10.3 mg/dL 8.1(L) 8.5(L) 8.4(L)  Total Protein 6.5 - 8.1 g/dL - - -  Total Bilirubin 0.3 - 1.2 mg/dL - - -  Alkaline Phos 38 - 126 U/L - - -  AST 15 - 41 U/L - - -  ALT 0 - 44 U/L - - -     Imaging studies: No new pertinent imaging studies   Assessment/Plan:  80 y.o. female  1 Day Post-Op s/p exploratory laparotomy with subtotal colectomy and creation of end ileostomy secondary to proximal descending colon mass with subsequent large bowel obstruction with cecal necrosis and perforation   - Okay to initiate CLD as tolerated  - Continue IVF resuscitation - Continue IV Abx (Rocephin / Flagyl) - Continue foley catheter for now due to slight AKI; may be able to remove this afternoon/tomorrow - Add PPI and Tums  - Monitor abdominal examination; on-going ileostomy function - Will engage [place] RN; ileostomy teaching & wound vac changes MWF; appreciate their assistance   - Encouraged early mobilization if possible; low threshold to engage PT   - Await surgical pathology   All of the above findings and recommendations were discussed with the patient, patient's family (husband at bedside), and the medical team, and all of patient's and family's questions were answered to their expressed satisfaction.  -- Edison Simon,  PA-C [facility] 11/17/2020, 7:17 AM [phone] M-F: 7am - 4pm  I saw and evaluated the patient.  I agree with the above documentation, exam, and plan, which I have edited where appropriate. Fredirick Maudlin  3:47 PM

## 2020-11-17 NOTE — Consult Note (Signed)
[facility] Nurse ostomy follow up Patient receiving care in [facility]. Spouse, Iona Beard, present at time of my visit. Stoma type/location: RUQ ileostomy Stomal assessment/size: moist, red Peristomal assessment: deferred.  Supplies must be obtained by [place]. Treatment options for stomal/peristomal skin: skin barrier Output: thin dark green effluent  Ostomy pouching: 2pc. 2 and 1/4 inches flat pouching system. Skin barrier over the VAC drape, therefore, pouch change not performed today. Education provided: The patient and her spouse were each given a Interior and spatial designer.  Iona Beard assured me he will review his tonight after work.  I showed both of them how to open the existing pouch and empty it into a graduated cylinder.  I explained why the [place] nurse needed to wait until tomorrow to perform a pouch change, due to the ostomy appliance being over the VAC drape.  There does seem to be enough intact skin to make the VAC change and the ostomy system change independent processes.  Iona Beard assures me he will be present tomorrow for the teaching session.  I am reaching out via Secure Chat to my [place] colleague, Domenic Moras, to let her know of his request to arrange a time for this tomorrow.  I will be at the [place]. Enrolled patient in [place] Start Discharge program: No   I have provided ostomy supply numbers and VAC dressing numbers to the [place] so she can order them and place them in the patient's room. Val Riles, RN, MSN, CWOCN, CNS-BC, pager [phone]

## 2020-11-17 NOTE — Evaluation (Signed)
Physical Therapy Evaluation Patient Details Name: Jenna [name] MRN: [id number] DOB: [birth year] Today's Date: 11/17/2020   History of Present Illness  Patient is a 80 year old female now post-op s/p exploratory laparotomy with subtotal colectomy and creation of end ileostomy secondary to proximal descending colon mass with subsequent large bowel obstruction with cecal necrosis and perforation.  Clinical Impression  Patient cooperative and agreeable to PT. She is eager to get out of bed and reports no pain. Patient needed minimal assistance with bed mobility. Several standing bouts performed from bed with Min guard for safety. Patient ambulated a lap around nursing station with rolling walker with Min guard initially, progressing to supervision. No loss of balance with ambulation, however recommend rolling walker for safety with initial ambulation bout as patient reaching out for furniture with no UE support. Sp02 93% on room air. Recommend to continue PT while in the hospital to maximize independence and decrease caregiver burden. Do not anticipate home health PT needs at this time.     Follow Up Recommendations No PT follow up    Equipment Recommendations  None recommended by PT    Recommendations for Other Services       Precautions / Restrictions Precautions Precautions: Fall Restrictions Weight Bearing Restrictions: No      Mobility  Bed Mobility Overal bed mobility: Needs Assistance Bed Mobility: Supine to Sit;Sit to Supine     Supine to sit: Min assist;HOB elevated Sit to supine: Min guard;HOB elevated   General bed mobility comments: increased time required to complete bed mobility tasks. cues for sequencing    Transfers Overall transfer level: Needs assistance Equipment used: Rolling walker (2 wheeled) Transfers: Sit to/from Stand Sit to Stand: Min guard         General transfer comment: Min guard for safety. multiple standing bouts performed. patient had  some drainage from ostomy bag initially with standing after daughter had assisted her with draining the bag prior to mobility. clean compression hose and socks donned with maximal assistance prior to hallway ambulation.  Ambulation/Gait Ambulation/Gait assistance: Min guard;Supervision Gait Distance (Feet): 175 Feet Assistive device: Rolling walker (2 wheeled) Gait Pattern/deviations: Step-through pattern Gait velocity: decreased   General Gait Details: patient took several steps initially without rolling walker and needed to reach out for furniture for support. recommend to use rolling walker for hallway ambulation for safety. mild coughing noted with mobility, Sp02 93% on room air.  Stairs            Wheelchair Mobility    Modified Rankin (Stroke Patients Only)       Balance Overall balance assessment: Needs assistance Sitting-balance support: Feet supported;No upper extremity supported Sitting balance-Leahy Scale: Good     Standing balance support: No upper extremity supported Standing balance-Leahy Scale: Fair Standing balance comment: patient reaching for UE support in standing with dynamic activity                             Pertinent Vitals/Pain Pain Assessment: No/denies pain    Home Living Family/patient expects to be discharged to:: Private residence Living Arrangements: Spouse/significant other Available Help at Discharge: Family;Available 24 hours/day Type of Home: House       Home Layout: Able to live on main level with bedroom/bathroom Home Equipment: Shower seat;Walker - 2 wheels      Prior Function Level of Independence: Independent  Hand Dominance        Extremity/Trunk Assessment   Upper Extremity Assessment Upper Extremity Assessment: Overall WFL for tasks assessed    Lower Extremity Assessment Lower Extremity Assessment: Overall WFL for tasks assessed       Communication   Communication: No  difficulties  Cognition Arousal/Alertness: Awake/alert Behavior During Therapy: WFL for tasks assessed/performed Overall Cognitive Status: Within Functional Limits for tasks assessed                                        General Comments      Exercises     Assessment/Plan    PT Assessment Patient needs continued PT services  PT Problem List Decreased strength;Decreased balance;Decreased mobility;Decreased safety awareness;Decreased knowledge of precautions;Decreased activity tolerance       PT Treatment Interventions Gait training;DME instruction;Stair training;Functional mobility training;Therapeutic exercise;Balance training;Therapeutic activities;Neuromuscular re-education;Patient/family education    PT Goals (Current goals can be found in the Care Plan section)  Acute Rehab PT Goals Patient Stated Goal: to go home PT Goal Formulation: With patient Time For Goal Achievement: 12/01/20 Potential to Achieve Goals: Good    Frequency Min 2X/week   Barriers to discharge        Co-evaluation               AM-PAC PT "6 Clicks" Mobility  Outcome Measure Help needed turning from your back to your side while in a flat bed without using bedrails?: None Help needed moving from lying on your back to sitting on the side of a flat bed without using bedrails?: A Little Help needed moving to and from a bed to a chair (including a wheelchair)?: A Little Help needed standing up from a chair using your arms (e.g., wheelchair or bedside chair)?: A Little Help needed to walk in hospital room?: A Little Help needed climbing 3-5 steps with a railing? : A Little 6 Click Score: 19    End of Session Equipment Utilized During Treatment:  (wound vac for abdominal incision) Activity Tolerance: Patient tolerated treatment well;No increased pain Patient left: in bed;with call bell/phone within reach;with family/visitor present;with SCD's reapplied Nurse Communication:  Mobility status PT Visit Diagnosis: Unsteadiness on feet (R26.81);Muscle weakness (generalized) (M62.81)    Time: 6759-1638 PT Time Calculation (min) (ACUTE ONLY): 37 min   Charges:   PT Evaluation $PT Eval Moderate Complexity: 1 Mod PT Treatments $Gait Training: 8-22 mins $Therapeutic Activity: 8-22 mins        Minna Merritts, PT, MPT   Percell Locus 11/17/2020, 3:58 PM

## 2020-11-17 NOTE — Clinical Social Work Note (Signed)
Made home wound vac referral to [facility] with [facility]. She will email escript to surgical PA to sign. Will contact patient about home health once PT evaluates her.  Dayton Scrape, [place]

## 2020-11-17 NOTE — Consult Note (Signed)
[place] Nurse Consult Note: Patient receiving care in [facility] Reason for Consult: NPWT dressing change and ileostomy care to begin 8/17. Patient was placed on the follow-up list for the [facility] service for NPWT and ostomy care/teaching to begin 8/17. Val Riles, RN, MSN, CWOCN, CNS-BC, pager [phone]

## 2020-11-18 LAB — CBC
HCT: 29.4 % — ABNORMAL LOW (ref 36.0–46.0)
Hemoglobin: 9.5 g/dL — ABNORMAL LOW (ref 12.0–15.0)
MCH: 29.5 pg (ref 26.0–34.0)
MCHC: 32.3 g/dL (ref 30.0–36.0)
MCV: 91.3 fL (ref 80.0–100.0)
Platelets: 163 10*3/uL (ref 150–400)
RBC: 3.22 MIL/uL — ABNORMAL LOW (ref 3.87–5.11)
RDW: 13.5 % (ref 11.5–15.5)
WBC: 8.7 10*3/uL (ref 4.0–10.5)
nRBC: 0 % (ref 0.0–0.2)

## 2020-11-18 LAB — BASIC METABOLIC PANEL
Anion gap: 10 (ref 5–15)
BUN: 23 mg/dL (ref 8–23)
CO2: 18 mmol/L — ABNORMAL LOW (ref 22–32)
Calcium: 8.4 mg/dL — ABNORMAL LOW (ref 8.9–10.3)
Chloride: 100 mmol/L (ref 98–111)
Creatinine, Ser: 1.29 mg/dL — ABNORMAL HIGH (ref 0.44–1.00)
GFR, Estimated: 43 mL/min — ABNORMAL LOW (ref >60)
Glucose, Bld: 101 mg/dL — ABNORMAL HIGH (ref 70–99)
Potassium: 3.8 mmol/L (ref 3.5–5.1)
Sodium: 128 mmol/L — ABNORMAL LOW (ref 135–145)

## 2020-11-18 MED ORDER — SODIUM CHLORIDE 0.9 % IV SOLN: INTRAVENOUS | Status: DC

## 2020-11-18 NOTE — TOC Progression Note (Addendum)
Transition of Care [facility]) - Progression Note    Patient Details  Name: Jenna [name] MRN: [id number] Date of Birth: [birth year]  Transition of Care [facility]) CM/SW [place], RN Phone Number: 11/18/2020, 3:13 PM  Clinical Narrative:     [facility] unable to accept the patient, [facility] checking to see if they can accept the patient, awaiting on a responce Update, [facility] has agreed to accept the patient for the Nursing [facility] services      Expected Discharge Plan and Services                                                 Social Determinants of Health (SDOH) Interventions    Readmission Risk Interventions No flowsheet data found.

## 2020-11-18 NOTE — Progress Notes (Addendum)
[place] Hospital Day(s): 3.   Post op day(s): 2 Days Post-Op.   Interval History:  Patient seen and examined No acute events or new complaints overnight.  Patient reports she continues to feel much improved No abdominal pain No fever, chills, emesis, previous nausea and emesis resolved Leukocytosis remains resolved, WBC now 8.7K this morning Hgb to 9.5; likely dilutional component Slight bump in sCr to 1.29 (baseline 0.90 - 1.10); UO - 2850 ccs Hyponatremia to 128 Ileostomy with 1450 ccs out recorded  She has been on CLD; tolerating well Continues on Rocephin and Flagyl  Mobilized with PT; no issues   Vital signs in last 24 hours: [min-max] current  Temp:  [97.5 F (36.4 C)-98.5 F (36.9 C)] 98.5 F (36.9 C) (08/17 0437) Pulse Rate:  [74-87] 82 (08/17 0437) Resp:  [14-19] 16 (08/17 0437) BP: (102-127)/(60-89) 123/71 (08/17 0437) SpO2:  [94 %-99 %] 97 % (08/17 0437)     Height: 5\' 4"  (162.6 cm) Weight: 77.1 kg BMI (Calculated): 29.17   Intake/Output last 2 shifts:  08/16 0701 - 08/17 0700 In: 0  Out: 4300 [Urine:2850; Stool:1450]   Physical Exam:  Constitutional: alert, cooperative and no distress  Respiratory: breathing non-labored at rest  Cardiovascular: regular rate and sinus rhythm  Gastrointestinal: soft, non-tender, non-distended, no rebound/guarding. Ileostomy in the RLQ which is slightly dusky, dark liquid stool in bag Genitourinary: Foley in place; urine clear Integumentary: Midline wound with wound vac, good seal  Labs:  CBC Latest Ref Rng & Units 11/18/2020 11/17/2020 11/16/2020  WBC 4.0 - 10.5 K/uL 8.7 6.2 11.4(H)  Hemoglobin 12.0 - 15.0 g/dL 9.5(L) 10.0(L) 11.3(L)  Hematocrit 36.0 - 46.0 % 29.4(L) 29.8(L) 33.6(L)  Platelets 150 - 400 K/uL 163 176 215   CMP Latest Ref Rng & Units 11/18/2020 11/17/2020 11/16/2020  Glucose 70 - 99 mg/dL 101(H) 142(H) 127(H)  BUN 8 - 23 mg/dL 23 17 15   Creatinine 0.44 - 1.00 mg/dL  1.29(H) 1.20(H) 0.91  Sodium 135 - 145 mmol/L 128(L) 133(L) 135  Potassium 3.5 - 5.1 mmol/L 3.8 4.0 3.6  Chloride 98 - 111 mmol/L 100 105 104  CO2 22 - 32 mmol/L 18(L) 21(L) 22  Calcium 8.9 - 10.3 mg/dL 8.4(L) 8.1(L) 8.5(L)  Total Protein 6.5 - 8.1 g/dL - - -  Total Bilirubin 0.3 - 1.2 mg/dL - - -  Alkaline Phos 38 - 126 U/L - - -  AST 15 - 41 U/L - - -  ALT 0 - 44 U/L - - -     Imaging studies: No new pertinent imaging studies   Assessment/Plan:  80 y.o. female  2 Days Post-Op s/p exploratory laparotomy with subtotal colectomy and creation of end ileostomy secondary to proximal descending colon mass with subsequent large bowel obstruction with cecal necrosis and perforation   - Advance to FLD; add Gatorade for electrolyte replacement.  - Continue IVF resuscitation; stop LR; switch to NS at 50 ml/hr  - Monitor hyponatremia (suspect hypervolemic hyponatremia) - Continue IV Abx (Rocephin / Flagyl) - Discontinue foley catheter; good UO; monitor renal function  - Monitor abdominal examination; on-going ileostomy function; at some point may need to imitate imodium  - Will engage [place] RN; ileostomy teaching & wound vac changes MWF; appreciate their assistance   - Encouraged early mobilization if possible; worked with PT; no PT follow up   - Await surgical pathology   All of the above findings and recommendations were discussed with the patient, patient's  family (husband at bedside), and the medical team, and all of patient's and family's questions were answered to their expressed satisfaction.  -- Edison Simon, PA-C [facility] 11/18/2020, 7:25 AM [phone]  I saw and evaluated the patient.  I agree with the above documentation, exam, and plan, which I have edited where appropriate. Fredirick Maudlin  9:32 AM  M-F: 7am - 4pm

## 2020-11-18 NOTE — Consult Note (Signed)
[employer] Nurse ostomy consult note Stoma type/location: RLQ ileostomy with separation noted at 9o'clock, minimal Stomal assessment/size: 1 1/8" pink stoma with 0.3 cm separation at 9 o'clock Peristomal assessment: skin intact  separation noted Treatment options for stomal/peristomal skin: pouch was leaking today and abdomen is rounded and edematous. I am switching to 1 piece convex pouch with barrier ring.  Output high output  liquid green stool.   Ostomy pouching: 1pc.convex with barrier ring  may consider belt closer to discharge Education provided: spouse at bedside and cut pouch to fit, applied barrier ring and pouch, rolled closed.  We discussed 2-3 times weekly pouch changes.  Emptying when 1/3 full.  Patient with dry mouth today.  We discuss risk of dehydration and need to drink.  We discuss blockage and symptoms to look for.  Written materials at bedside.  I provided information on the outpatient clinic, located in [facility], if desired after discharge.  This will require MD referral.  Patient does not wish to learn self care stating her husband will do it all.  I have encouraged her to learn this as her spouse still works and she could leak, etc and need to change. She agrees to try.  Enrolled patient in [place] program: Yes will today.  [place] Nurse wound follow up NPWT dressing change to midline abdomen Wound type:surgical Measurement:20 cm x 6 cm x 5 cm (had formed false bottom and this was explored with cotton tip applicator)  Wound YQM:VHQIONG bleeding, 80% beefy red 20% yellow adipose Drainage (amount, consistency, odor) moderate serosanguinous no odor.  Periwound:intact  RLQ ileostomy  pouch cut off center to avoid NPWT dressing.  Dressing procedure/placement/frequency: 2 pieces black foam to wound bed to fill dead space.  Covered with drape and seal immediately achieved at 125 mmHg, Change MOn/WEd/Fri.  [place] team will follow.  Domenic Moras MSN, RN, FNP-BC  CWON Wound, Ostomy, Continence Nurse Pager [phone]

## 2020-11-18 NOTE — Progress Notes (Signed)
Foley removed per MD order.  Patient tolerated well.

## 2020-11-19 ENCOUNTER — Inpatient Hospital Stay: Payer: Medicare HMO

## 2020-11-19 ENCOUNTER — Other Ambulatory Visit: Payer: Self-pay | Admitting: Pathology

## 2020-11-19 LAB — BASIC METABOLIC PANEL
Anion gap: 6 (ref 5–15)
BUN: 14 mg/dL (ref 8–23)
CO2: 23 mmol/L (ref 22–32)
Calcium: 8.3 mg/dL — ABNORMAL LOW (ref 8.9–10.3)
Chloride: 106 mmol/L (ref 98–111)
Creatinine, Ser: 0.88 mg/dL (ref 0.44–1.00)
GFR, Estimated: >60 mL/min (ref >60)
Glucose, Bld: 114 mg/dL — ABNORMAL HIGH (ref 70–99)
Potassium: 3.5 mmol/L (ref 3.5–5.1)
Sodium: 135 mmol/L (ref 135–145)

## 2020-11-19 MED ORDER — ENOXAPARIN SODIUM 40 MG/0.4ML IJ SOSY
40.0000 mg | PREFILLED_SYRINGE | INTRAMUSCULAR | Status: DC
  Administered 2020-11-19: 40 mg via SUBCUTANEOUS
  Filled 2020-11-19: qty 0.4

## 2020-11-19 NOTE — Progress Notes (Signed)
[facility] Hospital Day(s): 4.   Post op day(s): 3 Days Post-Op.   Interval History:  Patient seen and examined No acute events or new complaints overnight.  Patient continues to do well Most of her complaints center around her having trouble getting to the bathroom in time to urinate, especially at night. She does have history of incontinence prior to hospitalization Additionally, she does have a dry cough this morning  No abdominal pain, fever, chills, emesis Renal function normalized; sCr - 0.88; UO - 1900 ccs + unmeasured Hyponatremia resolved this morning, now 135 Ileostomy with 825 ccs out recorded  She has been on FLD; tolerating well Continues on Rocephin and Flagyl; Day 3 Mobilized with PT; no issues   Vital signs in last 24 hours: [min-max] current  Temp:  [98.2 F (36.8 C)-99.1 F (37.3 C)] 98.6 F (37 C) (08/18 0448) Pulse Rate:  [73-88] 88 (08/18 0448) Resp:  [16-17] 17 (08/18 0448) BP: (113-129)/(63-77) 129/69 (08/18 0448) SpO2:  [94 %-97 %] 97 % (08/18 0448)     Height: 5\' 4"  (162.6 cm) Weight: 77.1 kg BMI (Calculated): 29.17   Intake/Output last 2 shifts:  08/17 0701 - 08/18 0700 In: 2291.8 [P.O.:980; I.V.:1011.9; IV Piggyback:299.9] Out: 2725 [Urine:1900; Stool:825]   Physical Exam:  Constitutional: alert, cooperative and no distress  Respiratory: breathing non-labored at rest  Cardiovascular: regular rate and sinus rhythm  Gastrointestinal: soft, non-tender, non-distended, no rebound/guarding. Ileostomy in the RLQ which is pink, liquid stool in bag Integumentary: Midline wound with wound vac, good seal  Labs:  CBC Latest Ref Rng & Units 11/18/2020 11/17/2020 11/16/2020  WBC 4.0 - 10.5 K/uL 8.7 6.2 11.4(H)  Hemoglobin 12.0 - 15.0 g/dL 9.5(L) 10.0(L) 11.3(L)  Hematocrit 36.0 - 46.0 % 29.4(L) 29.8(L) 33.6(L)  Platelets 150 - 400 K/uL 163 176 215   CMP Latest Ref Rng & Units 11/19/2020 11/18/2020 11/17/2020  Glucose  70 - 99 mg/dL 114(H) 101(H) 142(H)  BUN 8 - 23 mg/dL 14 23 17   Creatinine 0.44 - 1.00 mg/dL 0.88 1.29(H) 1.20(H)  Sodium 135 - 145 mmol/L 135 128(L) 133(L)  Potassium 3.5 - 5.1 mmol/L 3.5 3.8 4.0  Chloride 98 - 111 mmol/L 106 100 105  CO2 22 - 32 mmol/L 23 18(L) 21(L)  Calcium 8.9 - 10.3 mg/dL 8.3(L) 8.4(L) 8.1(L)  Total Protein 6.5 - 8.1 g/dL - - -  Total Bilirubin 0.3 - 1.2 mg/dL - - -  Alkaline Phos 38 - 126 U/L - - -  AST 15 - 41 U/L - - -  ALT 0 - 44 U/L - - -     Imaging studies: No new pertinent imaging studies   Assessment/Plan:  80 y.o. female 3 Days Post-Op s/p exploratory laparotomy with subtotal colectomy and creation of end ileostomy secondary to proximal descending colon mass with subsequent large bowel obstruction with cecal necrosis and perforation   - Advance to soft/regular diet this morning  - Stop IVF  - Will get CXR given cough - Continue IV Abx (Rocephin / Flagyl); Day 3  - Monitor abdominal examination; on-going ileostomy function; at some point may need to imitate imodium  - Will engage [place] RN; ileostomy teaching & wound vac changes MWF; appreciate their assistance   - Encouraged early mobilization if possible; worked with PT; no PT follow up   - Arrange for home health services  - Await surgical pathology    - Discharge Planning: She may be potentially ready for discharge home  tomorrow (08/19) pending establishment of home health and clinical condition  All of the above findings and recommendations were discussed with the patient, patient's family (husband at bedside), and the medical team, and all of patient's and family's questions were answered to their expressed satisfaction.  -- Edison Simon, PA-C [facility] 11/19/2020, 7:15 AM [phone]

## 2020-11-19 NOTE — TOC Progression Note (Addendum)
Transition of Care [facility]) - Progression Note    Patient Details  Name: Jenna [name] MRN: [id number] Date of Birth: 12-31-1940  Transition of Care [facility]) CM/SW [place], RN Phone Number: 11/19/2020, 10:00 AM  Clinical Narrative:   Met with the patient in the room to discuss Needs for DC, she has a rolling walker at home but needs a 3 in 1, I contacted [employer] Suanne Marker and it will be brought to the room, She is set up with [facility] for wound vac dressing changes, [employer] 3 m  to bring the wound vac they are waiting on ins auth for the patient, anticipate DC tomorrow, no additional needs        Expected Discharge Plan and Services                                                 Social Determinants of Health (SDOH) Interventions    Readmission Risk Interventions No flowsheet data found.

## 2020-11-19 NOTE — Care Management Important Message (Signed)
Important Message  Patient Details  Name: Jenna [name] MRN: [id number] Date of Birth: 16-Apr-1940   Medicare Important Message Given:  Yes     Juliann Pulse A [place] Hankin 11/19/2020, 11:21 AM

## 2020-11-20 ENCOUNTER — Other Ambulatory Visit: Payer: Self-pay | Admitting: General Surgery

## 2020-11-20 MED ORDER — PANTOPRAZOLE SODIUM 20 MG PO TBEC: 20.0000 mg | DELAYED_RELEASE_TABLET | Freq: Every day | ORAL | 1 refills | Status: DC

## 2020-11-20 MED ORDER — AMOXICILLIN-POT CLAVULANATE 875-125 MG PO TABS: 1.0000 | ORAL_TABLET | Freq: Two times a day (BID) | ORAL | 0 refills | Status: AC

## 2020-11-20 NOTE — Consult Note (Signed)
Jenna [name] wound follow up Jenna [name] receiving care in [facility]. Spouse present for teaching. Wound type: abdominal surgical Measurement: deferred Wound bed: 100% pink granulation tissue Drainage (amount, consistency, odor) serosanginous in cannister Periwound: intact Dressing procedure/placement/frequency: all pieces of black foam removed from wound, 3 pieces of black foam placed, drape applied. Immediate seal obtained. Jenna [name] tolerated well.  [name] ostomy follow up Stoma type/location: RUQ ileostomy Stomal assessment/size: 1 1/4 inches round, red, moist, sutures intact Peristomal assessment: intact Treatment options for stomal/peristomal skin: barrier ring Output: thin brown effluent Ostomy pouching: 1pc convex Education provided: Spouse performed entire ostomy pouch care and change activities with very little assistance from me. Enrolled Jenna [name] in [place] Start Discharge program: Yes-today  Five 2 piece flat 2 1/4 inch pouching systems and 4 convex one piece pouching systems provided for home.  Val Riles, RN, MSN, CWOCN, CNS-BC, pager [phone]

## 2020-11-20 NOTE — Progress Notes (Signed)
Pt and husband instructed on ostomy care by [place] RN at bedside. All questions answered. All wound VAC supplies and ostomy care supplies sent home with pt. Pt and husband provided discharge instructions. Pt discharged in wheelchair by Nurse Aide. DME equipment and all belongings sent with pt.     11/20/20 0728  Vitals  Temp 98.4 F (36.9 C)  BP 127/71  MAP (mmHg) 87  BP Location Right Arm  BP Method Automatic  Patient Position (if appropriate) Lying  Pulse Rate 77  Pulse Rate Source Monitor  Resp 16  MEWS COLOR  MEWS Score Color Green  Oxygen Therapy  SpO2 97 %  O2 Device Room Air

## 2020-11-20 NOTE — Discharge Instructions (Signed)
In addition to included general post-operative instructions,  Diet: Resume home diet. Encourage adding protein drinks (ex: Ensure, Premier, Protain Powder) if you feel like you have a decreased appetite.   Dehydration: You are at an increased risk for dehydration with ileostomy. Encouraged adequate PO intake and supplementing with Gatorade, Pedialyte to maintain adequate hydration. Signs and symptoms of dehydration include:   Headache, delirium, confusion. Tiredness (fatigue). Dizziness, weakness, light-headedness. Dry mouth High heart rate but low blood pressure. Loss of appetite Flushed (red) skin. Swollen feet. Muscle cramps. Constipation. Dark-colored pee (urine), or no urine output   Ileostomy: Ideally, your ileostomy output should be around 1-1.5 liters a day. In this range, you should be able to maintain hydration with PO intake alone. If you start to notice the output is higher, please call our office. Likely, we will need to start you on Imodium, which is an over the counter anti-diarrheal medication to slow the output. Additionally, if you notice no output from your ileostomy over 24-48 hours with increased distension, pain, or emesis, please give [place] a call.     Activity: No heavy lifting >20 pounds (children, pets, laundry, garbage) or strenuous activity for 6 weeks, but light activity and walking are encouraged. Do not drive or drink alcohol if taking narcotic pain medications or having pain that might distract from driving.  Wound care: Continue with midline wound vac changes Monday, Wednesday, Friday. Please remember to bring wound vac supplies to your surgery follow up appointments.   Medications: Resume all home medications. For mild to moderate pain: acetaminophen (Tylenol) or ibuprofen/naproxen (if no kidney disease). Combining Tylenol with alcohol can substantially increase your risk of causing liver disease. Narcotic pain medications, if prescribed, can be used for severe  pain, though may cause nausea, constipation, and drowsiness. Do not combine Tylenol and Percocet (or similar) within a 6 hour period as Percocet (and similar) contain(s) Tylenol. If you do not need the narcotic pain medication, you do not need to fill the prescription.  Call office [phone] / [phone]) at any time if any questions, worsening pain, fevers/chills, bleeding, drainage from incision site, or other concerns.

## 2020-11-20 NOTE — Discharge Summary (Addendum)
[facility] SURGICAL DISCHARGE SUMMARY   Patient ID: Jenna [name] MRN: [id number] DOB/AGE: 05-16-1940 80 y.o.  Admit date: 11/14/2020 Discharge date: 11/20/2020  Discharge Diagnoses Colon mass - pathology showed adenocarcinoma with 0/29 lymph nodes Cecal perforation Large Bowel obstruction  Consultants None  Procedures 11/16/2020:  Exploratory laparotomy, mobilization of the splenic flexure, subtotal colectomy, end ileostomy, wound VAC placement measuring 24 cm long by 5 cm wide by 3.5 cm deep  HPI: Jenna [name] is a 80 y.o. female presented to [facility] ED initially on 08/13 for evaluation of abdominal pain. Patient reported around a 48 hour history of intermittent crampy abdominal pain prior to her presentation. However, this ultimately became a more sharp pain. At the worst, this was 10/10. She reported that standing up would give her some relief. Endorses associated nausea and emesis. She does not feel her bowel have moved recently either. No fever, chills, cough, CP, SOB, or urinary changes. No history of similar. Only previous intra-abdominal surgery is cholecystectomy. Unsure when he last colonoscopy was. Laboratory work up in the ED was relatively reassuring; however, on CT Abdomen/Pelvis there is concern for descending colon diverticulitis with possible abscess vs mass and associated proximal colonic dilation. She was admitted to the medicine service and started on rocephin and Flagyl.  Hospital Course: Informed consent was obtained and documented, and patient underwent exploratory laparotomy, subtotal colectomy, and end ileostomy (Dr Celine Ahr, 11/16/2020).  Post-operatively, patient did remarkably well. She did have issues with AKI and hyponatremia in the first 2 post-op days but this resolved with switching to NS and lowering the rate. She had immediate ileostomy function with [phone] ccs daily. She did not need antidiarrheal initiation during her stay. Advancement  of patient's diet and ambulation were well-tolerated. The remainder of patient's hospital course was essentially unremarkable, and discharge planning was initiated accordingly with patient safely able to be discharged home with appropriate discharge instructions, antibiotics (Augmentin x3 days), pain control, and outpatient follow-up after all of her and her family's questions were answered to their expressed satisfaction.    Pathology reviewed with patient on day of discharge:  DIAGNOSIS:  A. COLON; SUBTOTAL COLECTOMY:  - INVASIVE COLORECTAL ADENOCARCINOMA, ULCERATED AND WITH ASSOCIATED  PERICOLIC ABSCESS, SEE COMMENT.  - TWENTY-NINE LYMPH NODES NEGATIVE FOR MALIGNANCY (0/29).  - SEE CANCER SUMMARY BELOW.  - DILATED COLON PROXIMAL TO THE MASS WITH TRANSMURAL CECAL NECROSIS AND  ACUTE SEROSITIS CONSISTENT WITH PERFORATION.  - PROXIMAL COLON AND TERMINAL ILEUM MUCOSA WITH EDEMA AND ISCHEMIC  ENTEROCOLITIS.  - APPENDIX WITH ACUTE SEROSITIS.  - DIVERTICULOSIS.  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   Will provide outpatient referral to oncology as well as discuss during weekly tumor board.    Discharge Condition: Good   Physical Examination:  Constitutional: alert, cooperative and no distress  Respiratory: breathing non-labored at rest  Cardiovascular: regular rate and sinus rhythm  Gastrointestinal: soft, non-tender, non-distended, no rebound/guarding. Ileostomy in the RLQ which is pink, liquid stool in bag Integumentary: Midline wound with wound vac, good seal   Allergies as of 11/20/2020       Reactions   Nicotine Rash        Medication List     TAKE these medications    allopurinol 100 MG tablet Commonly known as: ZYLOPRIM Take 100 mg by mouth every evening.   amoxicillin-clavulanate 875-125 MG tablet Commonly known as: Augmentin Take 1 tablet by mouth 2 (two) times daily for 3 days.   aspirin EC 81  MG tablet Take 81 mg by mouth every  evening.   Calcium-Magnesium-Zinc 333-133-5 MG Tabs Take 1 tablet by mouth daily.   fenofibrate 54 MG tablet Take 54 mg by mouth every evening.   levothyroxine 50 MCG tablet Commonly known as: SYNTHROID Take 50 mcg by mouth daily.   multivitamin tablet Take 1 tablet by mouth daily.   pantoprazole 20 MG tablet Commonly known as: Protonix Take 1 tablet (20 mg total) by mouth daily.   rosuvastatin 40 MG tablet Commonly known as: CRESTOR Take 40 mg by mouth every evening.               Durable Medical Equipment  (From admission, onward)           Start     Ordered   11/19/20 0746  For home use only DME Bedside commode  Once       Question:  Patient needs a bedside commode to treat with the following condition  Answer:  Physical deconditioning   11/19/20 0746              Follow-up Information     Tylene Fantasia, PA-C. Schedule an appointment as soon as possible for a visit in 2 week(s).   Specialty: Physician Assistant Why: s/p subtotal colectomy, colon cancer, has ileostomy. Needs Monday/Wednesday/Friday appointment, needs to bring wound vac supplies Contact information: [address] [place] [address] [phone]         Cammie Sickle, MD. Schedule an appointment as soon as possible for a visit in 2 week(s).   Specialties: Internal Medicine, Oncology Why: New colon cancer diagnosis; adenocarcinoma, s/p subtotal colectoy on 08/15 Contact information: [place] [address] [phone]                  Time spent on discharge management including discussion of hospital course, clinical condition, outpatient instructions, prescriptions, and follow up with the patient and members of the medical team: >30 minutes  -- Edison Simon , PA-C [facility]  11/20/2020, 8:26 AM [phone] M-F: 7am - 4pm  Patient had departed the facility prior to my evaluation. I discussed her  care and plan with Mr. Olean Ree and concur with the above documentation.

## 2020-11-21 DIAGNOSIS — T8131XA Disruption of external operation (surgical) wound, not elsewhere classified, initial encounter: Secondary | ICD-10-CM | POA: Diagnosis not present

## 2020-11-21 LAB — AEROBIC/ANAEROBIC CULTURE W GRAM STAIN (SURGICAL/DEEP WOUND): Culture: NO GROWTH

## 2020-11-23 DIAGNOSIS — Z483 Aftercare following surgery for neoplasm: Secondary | ICD-10-CM | POA: Diagnosis not present

## 2020-11-23 DIAGNOSIS — C7989 Secondary malignant neoplasm of other specified sites: Secondary | ICD-10-CM | POA: Diagnosis not present

## 2020-11-23 DIAGNOSIS — I129 Hypertensive chronic kidney disease with stage 1 through stage 4 chronic kidney disease, or unspecified chronic kidney disease: Secondary | ICD-10-CM | POA: Diagnosis not present

## 2020-11-23 DIAGNOSIS — N1831 Chronic kidney disease, stage 3a: Secondary | ICD-10-CM | POA: Diagnosis not present

## 2020-11-23 DIAGNOSIS — N179 Acute kidney failure, unspecified: Secondary | ICD-10-CM | POA: Diagnosis not present

## 2020-11-23 DIAGNOSIS — M1030 Gout due to renal impairment, unspecified site: Secondary | ICD-10-CM | POA: Diagnosis not present

## 2020-11-23 DIAGNOSIS — Z432 Encounter for attention to ileostomy: Secondary | ICD-10-CM | POA: Diagnosis not present

## 2020-11-23 DIAGNOSIS — C19 Malignant neoplasm of rectosigmoid junction: Secondary | ICD-10-CM | POA: Diagnosis not present

## 2020-11-23 DIAGNOSIS — Z4801 Encounter for change or removal of surgical wound dressing: Secondary | ICD-10-CM | POA: Diagnosis not present

## 2020-11-23 LAB — SURGICAL PATHOLOGY

## 2020-11-25 DIAGNOSIS — Z4801 Encounter for change or removal of surgical wound dressing: Secondary | ICD-10-CM | POA: Diagnosis not present

## 2020-11-25 DIAGNOSIS — C7989 Secondary malignant neoplasm of other specified sites: Secondary | ICD-10-CM | POA: Diagnosis not present

## 2020-11-25 DIAGNOSIS — Z483 Aftercare following surgery for neoplasm: Secondary | ICD-10-CM | POA: Diagnosis not present

## 2020-11-25 DIAGNOSIS — I129 Hypertensive chronic kidney disease with stage 1 through stage 4 chronic kidney disease, or unspecified chronic kidney disease: Secondary | ICD-10-CM | POA: Diagnosis not present

## 2020-11-25 DIAGNOSIS — Z432 Encounter for attention to ileostomy: Secondary | ICD-10-CM | POA: Diagnosis not present

## 2020-11-25 DIAGNOSIS — N1831 Chronic kidney disease, stage 3a: Secondary | ICD-10-CM | POA: Diagnosis not present

## 2020-11-25 DIAGNOSIS — C19 Malignant neoplasm of rectosigmoid junction: Secondary | ICD-10-CM | POA: Diagnosis not present

## 2020-11-25 DIAGNOSIS — M1030 Gout due to renal impairment, unspecified site: Secondary | ICD-10-CM | POA: Diagnosis not present

## 2020-11-25 DIAGNOSIS — N179 Acute kidney failure, unspecified: Secondary | ICD-10-CM | POA: Diagnosis not present

## 2020-11-26 ENCOUNTER — Encounter: Payer: Self-pay | Admitting: *Deleted

## 2020-11-27 DIAGNOSIS — N179 Acute kidney failure, unspecified: Secondary | ICD-10-CM | POA: Diagnosis not present

## 2020-11-27 DIAGNOSIS — Z432 Encounter for attention to ileostomy: Secondary | ICD-10-CM | POA: Diagnosis not present

## 2020-11-27 DIAGNOSIS — I129 Hypertensive chronic kidney disease with stage 1 through stage 4 chronic kidney disease, or unspecified chronic kidney disease: Secondary | ICD-10-CM | POA: Diagnosis not present

## 2020-11-27 DIAGNOSIS — C19 Malignant neoplasm of rectosigmoid junction: Secondary | ICD-10-CM | POA: Diagnosis not present

## 2020-11-27 DIAGNOSIS — N1831 Chronic kidney disease, stage 3a: Secondary | ICD-10-CM | POA: Diagnosis not present

## 2020-11-27 DIAGNOSIS — C7989 Secondary malignant neoplasm of other specified sites: Secondary | ICD-10-CM | POA: Diagnosis not present

## 2020-11-27 DIAGNOSIS — Z4801 Encounter for change or removal of surgical wound dressing: Secondary | ICD-10-CM | POA: Diagnosis not present

## 2020-11-27 DIAGNOSIS — Z483 Aftercare following surgery for neoplasm: Secondary | ICD-10-CM | POA: Diagnosis not present

## 2020-11-27 DIAGNOSIS — M1030 Gout due to renal impairment, unspecified site: Secondary | ICD-10-CM | POA: Diagnosis not present

## 2020-11-30 ENCOUNTER — Inpatient Hospital Stay: Payer: Medicare HMO | Admitting: Internal Medicine

## 2020-11-30 ENCOUNTER — Other Ambulatory Visit: Payer: Self-pay

## 2020-11-30 ENCOUNTER — Encounter: Payer: Self-pay | Admitting: Internal Medicine

## 2020-11-30 ENCOUNTER — Inpatient Hospital Stay: Payer: Medicare HMO | Attending: Internal Medicine | Admitting: Internal Medicine

## 2020-11-30 DIAGNOSIS — Z432 Encounter for attention to ileostomy: Secondary | ICD-10-CM | POA: Diagnosis not present

## 2020-11-30 DIAGNOSIS — C186 Malignant neoplasm of descending colon: Secondary | ICD-10-CM | POA: Diagnosis not present

## 2020-11-30 DIAGNOSIS — M1030 Gout due to renal impairment, unspecified site: Secondary | ICD-10-CM | POA: Diagnosis not present

## 2020-11-30 DIAGNOSIS — Z8673 Personal history of transient ischemic attack (TIA), and cerebral infarction without residual deficits: Secondary | ICD-10-CM | POA: Diagnosis not present

## 2020-11-30 DIAGNOSIS — I129 Hypertensive chronic kidney disease with stage 1 through stage 4 chronic kidney disease, or unspecified chronic kidney disease: Secondary | ICD-10-CM | POA: Diagnosis not present

## 2020-11-30 DIAGNOSIS — N1831 Chronic kidney disease, stage 3a: Secondary | ICD-10-CM | POA: Diagnosis not present

## 2020-11-30 DIAGNOSIS — D649 Anemia, unspecified: Secondary | ICD-10-CM | POA: Diagnosis not present

## 2020-11-30 DIAGNOSIS — C19 Malignant neoplasm of rectosigmoid junction: Secondary | ICD-10-CM | POA: Diagnosis not present

## 2020-11-30 DIAGNOSIS — N179 Acute kidney failure, unspecified: Secondary | ICD-10-CM | POA: Diagnosis not present

## 2020-11-30 DIAGNOSIS — C7989 Secondary malignant neoplasm of other specified sites: Secondary | ICD-10-CM | POA: Diagnosis not present

## 2020-11-30 DIAGNOSIS — M545 Low back pain, unspecified: Secondary | ICD-10-CM | POA: Diagnosis not present

## 2020-11-30 DIAGNOSIS — Z483 Aftercare following surgery for neoplasm: Secondary | ICD-10-CM | POA: Diagnosis not present

## 2020-11-30 DIAGNOSIS — E875 Hyperkalemia: Secondary | ICD-10-CM

## 2020-11-30 DIAGNOSIS — Z4801 Encounter for change or removal of surgical wound dressing: Secondary | ICD-10-CM | POA: Diagnosis not present

## 2020-11-30 LAB — CBC WITH DIFFERENTIAL/PLATELET
Abs Immature Granulocytes: 0.03 10*3/uL (ref 0.00–0.07)
Basophils Absolute: 0.1 10*3/uL (ref 0.0–0.1)
Basophils Relative: 1 %
Eosinophils Absolute: 0.4 10*3/uL (ref 0.0–0.5)
Eosinophils Relative: 4 %
HCT: 31.7 % — ABNORMAL LOW (ref 36.0–46.0)
Hemoglobin: 10.2 g/dL — ABNORMAL LOW (ref 12.0–15.0)
Immature Granulocytes: 0 %
Lymphocytes Relative: 34 %
Lymphs Abs: 3 10*3/uL (ref 0.7–4.0)
MCH: 29.3 pg (ref 26.0–34.0)
MCHC: 32.2 g/dL (ref 30.0–36.0)
MCV: 91.1 fL (ref 80.0–100.0)
Monocytes Absolute: 0.5 10*3/uL (ref 0.1–1.0)
Monocytes Relative: 6 %
Neutro Abs: 4.7 10*3/uL (ref 1.7–7.7)
Neutrophils Relative %: 55 %
Platelets: 458 10*3/uL — ABNORMAL HIGH (ref 150–400)
RBC: 3.48 MIL/uL — ABNORMAL LOW (ref 3.87–5.11)
RDW: 14.1 % (ref 11.5–15.5)
WBC: 8.6 10*3/uL (ref 4.0–10.5)
nRBC: 0 % (ref 0.0–0.2)

## 2020-11-30 LAB — COMPREHENSIVE METABOLIC PANEL
ALT: 17 U/L (ref 0–44)
AST: 21 U/L (ref 15–41)
Albumin: 3.8 g/dL (ref 3.5–5.0)
Alkaline Phosphatase: 47 U/L (ref 38–126)
Anion gap: 11 (ref 5–15)
BUN: 27 mg/dL — ABNORMAL HIGH (ref 8–23)
CO2: 23 mmol/L (ref 22–32)
Calcium: 9.7 mg/dL (ref 8.9–10.3)
Chloride: 99 mmol/L (ref 98–111)
Creatinine, Ser: 1.01 mg/dL — ABNORMAL HIGH (ref 0.44–1.00)
GFR, Estimated: 57 mL/min — ABNORMAL LOW (ref >60)
Glucose, Bld: 108 mg/dL — ABNORMAL HIGH (ref 70–99)
Potassium: 5.3 mmol/L — ABNORMAL HIGH (ref 3.5–5.1)
Sodium: 133 mmol/L — ABNORMAL LOW (ref 135–145)
Total Bilirubin: 0.4 mg/dL (ref 0.3–1.2)
Total Protein: 8 g/dL (ref 6.5–8.1)

## 2020-11-30 LAB — IRON AND TIBC
Iron: 50 ug/dL (ref 28–170)
Saturation Ratios: 14 % (ref 10.4–31.8)
TIBC: 356 ug/dL (ref 250–450)
UIBC: 306 ug/dL

## 2020-11-30 LAB — FERRITIN: Ferritin: 168 ng/mL (ref 11–307)

## 2020-11-30 LAB — LACTATE DEHYDROGENASE: LDH: 112 U/L (ref 98–192)

## 2020-11-30 NOTE — Assessment & Plan Note (Addendum)
#  Descending colon cancer stage II. I reviewed the stage as stage with patient/husband in detail. Also reviewed the images in detail.  #Had a long discussion with patient regarding risk of recurrence with colon cancer in general.  Stage II colon cancer-has a less chance of recurrence compared to stage III.  And hence adjuvant therapy is usually not recommended.  However I would recommend checking Signatera testing/CT DNA surveillance to help assess risk of response/close surveillance.  I discussed the pros and cons of tumor DNA surveillance.  # Anemia: Hb-10.4-likely secondary to chronic GI bleed/colonic malignancy./Surgery.  Recommend PO iron.   #Left neck Warthin's tumor-awaiting further evaluation/recommendations and recommended.  #History of stroke gait instability-not a very robust candidate for any adjuvant therapy.  Especially oxaliplatin-based therapy as needed.  #Chronic low back pain stable-not related to malignancy; reviewed imaging.  Thank you for allowing me to participate in the care of your pleasant patient. Please do not hesitate to contact me with questions or concerns in the interim.  # DISPOSITION:  # labs today- cbc/cmp;CEA; signtera testing.   # follow up in 4 weeks- MD; no labs- Dr.B

## 2020-11-30 NOTE — Progress Notes (Signed)
Introduced Therapist, nutritional. Provided contact information for future needs. Signatera blood draw collected and requisition sent to pathology.

## 2020-11-30 NOTE — Progress Notes (Signed)
[name]  Patient Care Team: Perrin Maltese, MD as PCP - General (Internal Medicine) Kate Sable, MD as PCP - Cardiology (Cardiology) Clent Jacks, RN as Oncology Nurse Navigator  CHIEF COMPLAINTS/PURPOSE OF CONSULTATION: colon cancer   #  Oncology History Overview Note    # [place] prior- 5.8 prior  Procedure: Subtotal colectomy   TUMOR  Tumor Site: Descending colon  Histologic Type: Adenocarcinoma  Histologic Grade: G2, moderately differentiated  Tumor Size: Greatest dimension: 4.5 cm  Tumor Extent: Invades through the muscularis propria into pericolorectal  tissue, see comment  Macroscopic Tumor Perforation: Not identified, see comment  Lymph-Vascular Invasion: Not identified  Perineural Invasion: Present  Treatment Effect: No known surgical therapy   MARGINS  Margin Status for Invasive Carcinoma: All margins negative for invasive  carcinoma  Margins examined: Proximal, distal, mesenteric  Margin Status for Non-Invasive Tumor: All margins negative for  high-grade dysplasia/intramucosal carcinoma and low-grade dysplasia   REGIONAL LYMPH NODES  Regional Lymph Nodes: All regional lymph nodes negative for tumor  Number of lymph nodes examined: 29  Tumor Deposits: Not identified   PATHOLOGIC STAGE CLASSIFICATION (pTNM, AJCC 8th Edition):  TNM Descriptors: Not applicable  pT3 at least, see comment  pN0  pM - Not applicable   # HLP; Acute Stroke [2021s/p TPA;[facility]]; Gout on allopurinol   Cancer of left colon ([place])  11/30/2020 Initial Diagnosis   Cancer of left colon ([place])   12/29/2020 Cancer Staging   Staging form: Colon and Rectum, AJCC 8th Edition - Pathologic: Stage IIA (pT3, pN0, cM0) - Signed by Cammie Sickle, MD on 12/29/2020 Total positive nodes: 0     HISTORY OF PRESENTING ILLNESS:  walks with cane/assistance; accompanied by her husband. Jenna [name] 80 y.o.  female patient with multiple medical  problems including history of stroke-is currently referred to [place] for further evaluation recommendations for new diagnosis of colon cancer.  In brief patient was admitted to hospital in mid August for worsening abdominal pain.  On further evaluation- CT Abdomen/Pelvis there is concern for descending colon diverticulitis with possible abscess vs mass and associated proximal colonic dilation. She was admitted to the medicine service and started on IV antibiotics.  Patient subsequently underwent expiratory laparotomy subtotal colectomy and end ileostomy with Dr. Celine Ahr.  Patient is currently discharged home.  She has a wound VAC-which seems to be healing well.  Patient currently walking with a walker because of gait instability/history of prior stroke.   Review of Systems  Constitutional:  Positive for malaise/fatigue and weight loss. Negative for chills, diaphoresis and fever.  HENT:  Negative for nosebleeds and sore throat.   Eyes:  Negative for double vision.  Respiratory:  Positive for shortness of breath. Negative for cough, hemoptysis, sputum production and wheezing.   Cardiovascular:  Negative for chest pain, palpitations, orthopnea and leg swelling.  Gastrointestinal:  Negative for abdominal pain, blood in stool, constipation, diarrhea, heartburn, melena, nausea and vomiting.  Genitourinary:  Negative for dysuria, frequency and urgency.  Musculoskeletal:  Positive for back pain and joint pain.  Skin: Negative.  Negative for itching and rash.  Neurological:  Negative for dizziness, tingling, focal weakness, weakness and headaches.  Endo/Heme/Allergies:  Does not bruise/bleed easily.  Psychiatric/Behavioral:  Negative for depression. The patient is not nervous/anxious and does not have insomnia.     MEDICAL HISTORY:  Past Medical History:  Diagnosis Date   BRCA negative 03/2020   MyRisk neg except MSH3 VUS   Colon  cancer [facility])    Family history of breast cancer     IBIS=4.4%/riskscore=2.9%   Gout    Hyperlipidemia    Hypertension    Stroke ([place]) 05/2019   Thyroid disease     SURGICAL HISTORY: Past Surgical History:  Procedure Laterality Date   APPLICATION OF WOUND VAC  11/16/2020   Procedure: APPLICATION OF WOUND VAC;  Surgeon: Fredirick Maudlin, MD;  Location: [facility];  Service: General;;  Serial # [id number]   BREAST BIOPSY Right 1978   neg   CHOLECYSTECTOMY     COLECTOMY WITH COLOSTOMY CREATION/HARTMANN PROCEDURE N/A 11/16/2020   Procedure: SUBTOTAL COLECTOMY WITH ILEOSTOMY CREATION;  Surgeon: Fredirick Maudlin, MD;  Location: [facility];  Service: General;  Laterality: N/A;   LAPAROTOMY N/A 11/16/2020   Procedure: EXPLORATORY LAPAROTOMY;  Surgeon: Fredirick Maudlin, MD;  Location: [facility];  Service: General;  Laterality: N/A;    SOCIAL HISTORY: Social History   Socioeconomic History   Marital status: Married    Spouse name: Not on file   Number of children: Not on file   Years of education: Not on file   Highest education level: Not on file  Occupational History   Not on file  Tobacco Use   Smoking status: Every Day    Packs/day: 0.25    Types: Cigarettes   Smokeless tobacco: Never   Tobacco comments:    smoking 4-5 cigarettes daily.   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 Lives with husband in [place].    Social Determinants of Health   Financial Resource Strain: Not on file  Food Insecurity: Not on file  Transportation Needs: Not on file  Physical Activity: Not on file  Stress: Not on file  Social Connections: Not on file  Intimate Partner Violence: Not on file    FAMILY HISTORY: Family History  Problem Relation Age of Onset   Breast cancer Maternal Grandmother 67   Breast  cancer Cousin 40    ALLERGIES:  is allergic to nicotine.  MEDICATIONS:  Current Outpatient Medications  Medication Sig Dispense Refill   Acetaminophen (TYLENOL EXTRA STRENGTH PO) Take 1 tablet by mouth daily at 12 noon.     allopurinol (ZYLOPRIM) 100 MG tablet Take 100 mg by mouth every evening.     aspirin EC 81 MG tablet Take 81 mg by mouth every evening.     fenofibrate 54 MG tablet Take 54 mg by mouth every evening.     levothyroxine (SYNTHROID) 50 MCG tablet Take 50 mcg by mouth daily.     pantoprazole (PROTONIX) 20 MG tablet Take 1 tablet (20 mg total) by mouth daily. 30 tablet 1   rosuvastatin (CRESTOR) 40 MG tablet Take 40 mg by mouth every evening.     Calcium-Magnesium-Zinc 333-133-5 MG TABS Take 1 tablet by mouth daily.     Multiple Vitamin (MULTIVITAMIN) tablet Take 1 tablet by mouth daily.     No current facility-administered medications for this visit.      Marland Kitchen  PHYSICAL EXAMINATION: ECOG PERFORMANCE STATUS: 1 - Symptomatic but completely ambulatory  Vitals:   11/30/20 1138  BP: 116/61  Pulse: 77  Resp: 20  Temp: 98.5 F (36.9 C)   Filed Weights   11/30/20  1138  Weight: 157 lb (71.2 kg)    Physical Exam Vitals and nursing note reviewed.  HENT:     Head: Normocephalic and atraumatic.     Mouth/Throat:     Pharynx: Oropharynx is clear.  Eyes:     Extraocular Movements: Extraocular movements intact.     Pupils: Pupils are equal, round, and reactive to light.  Cardiovascular:     Rate and Rhythm: Normal rate and regular rhythm.  Pulmonary:     Comments: Decreased breath sounds bilaterally.  Abdominal:     Palpations: Abdomen is soft.     Comments: Colostomy; woundvac  Musculoskeletal:        General: Normal range of motion.     Cervical back: Normal range of motion.  Skin:    General: Skin is warm.     Comments: Skin rash around the wound.   Neurological:     General: No focal deficit present.     Mental Status: She is alert and oriented to  person, place, and time.  Psychiatric:        Behavior: Behavior normal.        Judgment: Judgment normal.    LABORATORY DATA:  I have reviewed the data as listed Lab Results  Component Value Date   WBC 8.6 11/30/2020   HGB 10.2 (L) 11/30/2020   HCT 31.7 (L) 11/30/2020   MCV 91.1 11/30/2020   PLT 458 (H) 11/30/2020   Recent Labs    11/14/20 1153 11/15/20 0528 11/19/20 0455 11/30/20 1301 12/28/20 1551  NA 138   < > 135 133* 132*  K 3.9   < > 3.5 5.3* 4.1  CL 105   < > 106 99 102  CO2 22   < > 23 23 20*  GLUCOSE 135*   < > 114* 108* 95  BUN 17   < > 14 27* 18  CREATININE 0.98   < > 0.88 1.01* 0.94  CALCIUM 9.3   < > 8.3* 9.7 8.9  GFRNONAA 59*   < > >60 57* >60  PROT 7.5  --   --  8.0  --   ALBUMIN 4.2  --   --  3.8  --   AST 16  --   --  21  --   ALT 9  --   --  17  --   ALKPHOS 50  --   --  47  --   BILITOT 0.9  --   --  0.4  --    < > = values in this interval not displayed.    RADIOGRAPHIC STUDIES: I have personally reviewed the radiological images as listed and agreed with the findings in the report. No results found.  ASSESSMENT & PLAN:   Cancer of left colon ([place]) #Descending colon cancer stage II. I reviewed the stage as stage with patient/husband in detail. Also reviewed the images in detail.  #Had a long discussion with patient regarding risk of recurrence with colon cancer in general.  Stage II colon cancer-has a less chance of recurrence compared to stage III.  And hence adjuvant therapy is usually not recommended.  However I would recommend checking Signatera testing/CT DNA surveillance to help assess risk of response/close surveillance.  I discussed the pros and cons of tumor DNA surveillance.  # Anemia: Hb-10.4-likely secondary to chronic GI bleed/colonic malignancy./Surgery.  Recommend PO iron.   #Left neck Warthin's tumor-awaiting further evaluation/recommendations and recommended.  #History of stroke gait instability-not a very robust  candidate  for any adjuvant therapy.  Especially oxaliplatin-based therapy as needed.  #Chronic low back pain stable-not related to malignancy; reviewed imaging.  Thank you for allowing me to participate in the care of your pleasant patient. Please do not hesitate to contact me with questions or concerns in the interim.  # DISPOSITION:  # labs today- cbc/cmp;CEA; signtera testing.   # follow up in 4 weeks- MD; no labs- Dr.B  All questions were answered. The patient knows to call the clinic with any problems, questions or concerns.     Cammie Sickle, MD 12/29/2020 8:41 AM

## 2020-11-30 NOTE — Progress Notes (Signed)
Pt reports cloudy urine but clearing. No burning with urination. She reports incontinence.  She reports a red rash around wound vac dressing; rectal discharge and burning.  She would like to discuss removal of growth near ear (previously biopsied per patient which was negative). Dr. Richardson Landry has evaluated this in the past.  She reports constipation. She has only had 2 bms in 2 weeks. She has a colostomy bag.

## 2020-12-01 LAB — CEA: CEA: 2.2 ng/mL (ref 0.0–4.7)

## 2020-12-02 DIAGNOSIS — Z4801 Encounter for change or removal of surgical wound dressing: Secondary | ICD-10-CM | POA: Diagnosis not present

## 2020-12-02 DIAGNOSIS — Z432 Encounter for attention to ileostomy: Secondary | ICD-10-CM | POA: Diagnosis not present

## 2020-12-02 DIAGNOSIS — M1030 Gout due to renal impairment, unspecified site: Secondary | ICD-10-CM | POA: Diagnosis not present

## 2020-12-02 DIAGNOSIS — C19 Malignant neoplasm of rectosigmoid junction: Secondary | ICD-10-CM | POA: Diagnosis not present

## 2020-12-02 DIAGNOSIS — C7989 Secondary malignant neoplasm of other specified sites: Secondary | ICD-10-CM | POA: Diagnosis not present

## 2020-12-02 DIAGNOSIS — N1831 Chronic kidney disease, stage 3a: Secondary | ICD-10-CM | POA: Diagnosis not present

## 2020-12-02 DIAGNOSIS — I129 Hypertensive chronic kidney disease with stage 1 through stage 4 chronic kidney disease, or unspecified chronic kidney disease: Secondary | ICD-10-CM | POA: Diagnosis not present

## 2020-12-02 DIAGNOSIS — Z483 Aftercare following surgery for neoplasm: Secondary | ICD-10-CM | POA: Diagnosis not present

## 2020-12-02 DIAGNOSIS — N179 Acute kidney failure, unspecified: Secondary | ICD-10-CM | POA: Diagnosis not present

## 2020-12-04 DIAGNOSIS — I129 Hypertensive chronic kidney disease with stage 1 through stage 4 chronic kidney disease, or unspecified chronic kidney disease: Secondary | ICD-10-CM | POA: Diagnosis not present

## 2020-12-04 DIAGNOSIS — C7989 Secondary malignant neoplasm of other specified sites: Secondary | ICD-10-CM | POA: Diagnosis not present

## 2020-12-04 DIAGNOSIS — M1030 Gout due to renal impairment, unspecified site: Secondary | ICD-10-CM | POA: Diagnosis not present

## 2020-12-04 DIAGNOSIS — N1831 Chronic kidney disease, stage 3a: Secondary | ICD-10-CM | POA: Diagnosis not present

## 2020-12-04 DIAGNOSIS — Z483 Aftercare following surgery for neoplasm: Secondary | ICD-10-CM | POA: Diagnosis not present

## 2020-12-04 DIAGNOSIS — N179 Acute kidney failure, unspecified: Secondary | ICD-10-CM | POA: Diagnosis not present

## 2020-12-04 DIAGNOSIS — Z432 Encounter for attention to ileostomy: Secondary | ICD-10-CM | POA: Diagnosis not present

## 2020-12-04 DIAGNOSIS — Z4801 Encounter for change or removal of surgical wound dressing: Secondary | ICD-10-CM | POA: Diagnosis not present

## 2020-12-04 DIAGNOSIS — C19 Malignant neoplasm of rectosigmoid junction: Secondary | ICD-10-CM | POA: Diagnosis not present

## 2020-12-08 DIAGNOSIS — N1831 Chronic kidney disease, stage 3a: Secondary | ICD-10-CM | POA: Diagnosis not present

## 2020-12-08 DIAGNOSIS — Z483 Aftercare following surgery for neoplasm: Secondary | ICD-10-CM | POA: Diagnosis not present

## 2020-12-08 DIAGNOSIS — M1030 Gout due to renal impairment, unspecified site: Secondary | ICD-10-CM | POA: Diagnosis not present

## 2020-12-08 DIAGNOSIS — Z432 Encounter for attention to ileostomy: Secondary | ICD-10-CM | POA: Diagnosis not present

## 2020-12-08 DIAGNOSIS — C19 Malignant neoplasm of rectosigmoid junction: Secondary | ICD-10-CM | POA: Diagnosis not present

## 2020-12-08 DIAGNOSIS — Z4801 Encounter for change or removal of surgical wound dressing: Secondary | ICD-10-CM | POA: Diagnosis not present

## 2020-12-08 DIAGNOSIS — I129 Hypertensive chronic kidney disease with stage 1 through stage 4 chronic kidney disease, or unspecified chronic kidney disease: Secondary | ICD-10-CM | POA: Diagnosis not present

## 2020-12-08 DIAGNOSIS — C7989 Secondary malignant neoplasm of other specified sites: Secondary | ICD-10-CM | POA: Diagnosis not present

## 2020-12-08 DIAGNOSIS — N179 Acute kidney failure, unspecified: Secondary | ICD-10-CM | POA: Diagnosis not present

## 2020-12-10 DIAGNOSIS — F4321 Adjustment disorder with depressed mood: Secondary | ICD-10-CM | POA: Diagnosis not present

## 2020-12-10 DIAGNOSIS — Z7982 Long term (current) use of aspirin: Secondary | ICD-10-CM | POA: Diagnosis not present

## 2020-12-10 DIAGNOSIS — Z933 Colostomy status: Secondary | ICD-10-CM | POA: Diagnosis not present

## 2020-12-10 DIAGNOSIS — F32A Depression, unspecified: Secondary | ICD-10-CM | POA: Diagnosis not present

## 2020-12-10 DIAGNOSIS — E785 Hyperlipidemia, unspecified: Secondary | ICD-10-CM | POA: Diagnosis not present

## 2020-12-10 DIAGNOSIS — Z823 Family history of stroke: Secondary | ICD-10-CM | POA: Diagnosis not present

## 2020-12-10 DIAGNOSIS — Z7409 Other reduced mobility: Secondary | ICD-10-CM | POA: Diagnosis not present

## 2020-12-10 DIAGNOSIS — Z604 Social exclusion and rejection: Secondary | ICD-10-CM | POA: Diagnosis not present

## 2020-12-10 DIAGNOSIS — G3184 Mild cognitive impairment, so stated: Secondary | ICD-10-CM | POA: Diagnosis not present

## 2020-12-10 DIAGNOSIS — L309 Dermatitis, unspecified: Secondary | ICD-10-CM | POA: Diagnosis not present

## 2020-12-10 DIAGNOSIS — E039 Hypothyroidism, unspecified: Secondary | ICD-10-CM | POA: Diagnosis not present

## 2020-12-10 DIAGNOSIS — F17210 Nicotine dependence, cigarettes, uncomplicated: Secondary | ICD-10-CM | POA: Diagnosis not present

## 2020-12-10 DIAGNOSIS — Z8673 Personal history of transient ischemic attack (TIA), and cerebral infarction without residual deficits: Secondary | ICD-10-CM | POA: Diagnosis not present

## 2020-12-10 DIAGNOSIS — C189 Malignant neoplasm of colon, unspecified: Secondary | ICD-10-CM | POA: Diagnosis not present

## 2020-12-11 ENCOUNTER — Encounter: Payer: Self-pay | Admitting: Physician Assistant

## 2020-12-11 ENCOUNTER — Ambulatory Visit (INDEPENDENT_AMBULATORY_CARE_PROVIDER_SITE_OTHER): Payer: Medicare HMO | Admitting: Physician Assistant

## 2020-12-11 ENCOUNTER — Other Ambulatory Visit: Payer: Self-pay

## 2020-12-11 VITALS — BP 177/73 | HR 94 | Temp 98.3°F | Ht 64.0 in | Wt 155.2 lb

## 2020-12-11 DIAGNOSIS — C186 Malignant neoplasm of descending colon: Secondary | ICD-10-CM

## 2020-12-11 DIAGNOSIS — Z09 Encounter for follow-up examination after completed treatment for conditions other than malignant neoplasm: Secondary | ICD-10-CM

## 2020-12-11 NOTE — Patient Instructions (Addendum)
We have removed the wound vac today. Just place a dry gauze and abd pad over the wound and tape in place. Change this once a day until the wound fully closes.   May use Imodium 2 mg up to three times a day to help with the liquid stools.   Be sure to drink plenty of liquids and increase your protein intake.   We will see you back here after seeing Dr Rogue Bussing.

## 2020-12-11 NOTE — Progress Notes (Signed)
[facility] POST-OP OFFICE VISIT  12/11/2020  HPI: Jenna [name] is a 80 y.o. female 27 days s/p exploratory laparotomy, subtotal colectomy, and ileostomy creation for obstructing colon mass which proved to be adenocarcinoma with 0/29 lymph nodes.   Since going home she continues to do remarkably well She has minimal abdominal pain No fever, chills, nausea, emesis She is having anywhere from [phone] ccs from her ileostomy, but this remains loose, not taking anti-diarrheals Wound vac to midline wound; home health thinks she is ready to discontinue this She was seen by oncology on 08/29; determining whether or not they want /need adjuvant chemotherapy, CEA 2.2; will follow up on 09/26   Vital signs: BP (!) 177/73   Pulse 94   Temp 98.3 F (36.8 C)   Ht 5\' 4"  (1.626 m)   Wt 155 lb 3.2 oz (70.4 kg)   SpO2 98%   BMI 26.64 kg/m    Physical Exam: Constitutional: Well appearing female, NAD Abdomen: Soft, non-tender, non-distended, no rebound/guarding. Ileostomy in the RLQ is pink, patent, liquid stool in bag Skin: Midline wound is almost all closed, small (~2 cm) area in the posterior portion that needs to close, wound vac removed, no erythema or drainage   Assessment/Plan: This is a 80 y.o. female 27 days s/p exploratory laparotomy, subtotal colectomy, and ileostomy creation for obstructing colon mass which proved to be adenocarcinoma with 0/29 lymph nodes   - Encouraged her to take 2mg  Imodium BID to try and thicken up her effluent to avoid risk of dehydration  - Continue to monitor and record ileostomy output  - Continue pain control prn  - We can discontinue wound vac; transition to superficial dressings daily + prn; family shown this  - Follow up with oncology as scheduled on 09/26  - I will see her again on 09/27 after oncology appointment to ensure no plans for chemotherapy / port placement  -- Edison Simon, PA-C [facility] 12/11/2020,  12:51 PM [phone] M-F: 7am - 4pm

## 2020-12-14 DIAGNOSIS — N1831 Chronic kidney disease, stage 3a: Secondary | ICD-10-CM | POA: Diagnosis not present

## 2020-12-14 DIAGNOSIS — Z483 Aftercare following surgery for neoplasm: Secondary | ICD-10-CM | POA: Diagnosis not present

## 2020-12-14 DIAGNOSIS — Z432 Encounter for attention to ileostomy: Secondary | ICD-10-CM | POA: Diagnosis not present

## 2020-12-14 DIAGNOSIS — Z4801 Encounter for change or removal of surgical wound dressing: Secondary | ICD-10-CM | POA: Diagnosis not present

## 2020-12-14 DIAGNOSIS — M1030 Gout due to renal impairment, unspecified site: Secondary | ICD-10-CM | POA: Diagnosis not present

## 2020-12-14 DIAGNOSIS — N179 Acute kidney failure, unspecified: Secondary | ICD-10-CM | POA: Diagnosis not present

## 2020-12-14 DIAGNOSIS — C7989 Secondary malignant neoplasm of other specified sites: Secondary | ICD-10-CM | POA: Diagnosis not present

## 2020-12-14 DIAGNOSIS — I129 Hypertensive chronic kidney disease with stage 1 through stage 4 chronic kidney disease, or unspecified chronic kidney disease: Secondary | ICD-10-CM | POA: Diagnosis not present

## 2020-12-14 DIAGNOSIS — C19 Malignant neoplasm of rectosigmoid junction: Secondary | ICD-10-CM | POA: Diagnosis not present

## 2020-12-16 DIAGNOSIS — N1831 Chronic kidney disease, stage 3a: Secondary | ICD-10-CM | POA: Diagnosis not present

## 2020-12-16 DIAGNOSIS — C19 Malignant neoplasm of rectosigmoid junction: Secondary | ICD-10-CM | POA: Diagnosis not present

## 2020-12-16 DIAGNOSIS — M1030 Gout due to renal impairment, unspecified site: Secondary | ICD-10-CM | POA: Diagnosis not present

## 2020-12-16 DIAGNOSIS — Z432 Encounter for attention to ileostomy: Secondary | ICD-10-CM | POA: Diagnosis not present

## 2020-12-16 DIAGNOSIS — I129 Hypertensive chronic kidney disease with stage 1 through stage 4 chronic kidney disease, or unspecified chronic kidney disease: Secondary | ICD-10-CM | POA: Diagnosis not present

## 2020-12-16 DIAGNOSIS — C7989 Secondary malignant neoplasm of other specified sites: Secondary | ICD-10-CM | POA: Diagnosis not present

## 2020-12-16 DIAGNOSIS — N179 Acute kidney failure, unspecified: Secondary | ICD-10-CM | POA: Diagnosis not present

## 2020-12-16 DIAGNOSIS — Z483 Aftercare following surgery for neoplasm: Secondary | ICD-10-CM | POA: Diagnosis not present

## 2020-12-16 DIAGNOSIS — Z4801 Encounter for change or removal of surgical wound dressing: Secondary | ICD-10-CM | POA: Diagnosis not present

## 2020-12-18 DIAGNOSIS — Z483 Aftercare following surgery for neoplasm: Secondary | ICD-10-CM | POA: Diagnosis not present

## 2020-12-18 DIAGNOSIS — C19 Malignant neoplasm of rectosigmoid junction: Secondary | ICD-10-CM | POA: Diagnosis not present

## 2020-12-18 DIAGNOSIS — Z4801 Encounter for change or removal of surgical wound dressing: Secondary | ICD-10-CM | POA: Diagnosis not present

## 2020-12-18 DIAGNOSIS — I129 Hypertensive chronic kidney disease with stage 1 through stage 4 chronic kidney disease, or unspecified chronic kidney disease: Secondary | ICD-10-CM | POA: Diagnosis not present

## 2020-12-18 DIAGNOSIS — C7989 Secondary malignant neoplasm of other specified sites: Secondary | ICD-10-CM | POA: Diagnosis not present

## 2020-12-18 DIAGNOSIS — N1831 Chronic kidney disease, stage 3a: Secondary | ICD-10-CM | POA: Diagnosis not present

## 2020-12-18 DIAGNOSIS — M1030 Gout due to renal impairment, unspecified site: Secondary | ICD-10-CM | POA: Diagnosis not present

## 2020-12-18 DIAGNOSIS — N179 Acute kidney failure, unspecified: Secondary | ICD-10-CM | POA: Diagnosis not present

## 2020-12-18 DIAGNOSIS — Z432 Encounter for attention to ileostomy: Secondary | ICD-10-CM | POA: Diagnosis not present

## 2020-12-20 DIAGNOSIS — T8131XA Disruption of external operation (surgical) wound, not elsewhere classified, initial encounter: Secondary | ICD-10-CM | POA: Diagnosis not present

## 2020-12-23 ENCOUNTER — Encounter: Payer: Self-pay | Admitting: Internal Medicine

## 2020-12-23 ENCOUNTER — Inpatient Hospital Stay: Payer: Medicare HMO

## 2020-12-23 ENCOUNTER — Other Ambulatory Visit: Payer: Self-pay

## 2020-12-23 DIAGNOSIS — C19 Malignant neoplasm of rectosigmoid junction: Secondary | ICD-10-CM | POA: Diagnosis not present

## 2020-12-23 DIAGNOSIS — Z432 Encounter for attention to ileostomy: Secondary | ICD-10-CM | POA: Diagnosis not present

## 2020-12-23 DIAGNOSIS — Z4801 Encounter for change or removal of surgical wound dressing: Secondary | ICD-10-CM | POA: Diagnosis not present

## 2020-12-23 DIAGNOSIS — Z483 Aftercare following surgery for neoplasm: Secondary | ICD-10-CM | POA: Diagnosis not present

## 2020-12-23 DIAGNOSIS — N179 Acute kidney failure, unspecified: Secondary | ICD-10-CM | POA: Diagnosis not present

## 2020-12-23 DIAGNOSIS — M1030 Gout due to renal impairment, unspecified site: Secondary | ICD-10-CM | POA: Diagnosis not present

## 2020-12-23 DIAGNOSIS — I129 Hypertensive chronic kidney disease with stage 1 through stage 4 chronic kidney disease, or unspecified chronic kidney disease: Secondary | ICD-10-CM | POA: Diagnosis not present

## 2020-12-23 DIAGNOSIS — C7989 Secondary malignant neoplasm of other specified sites: Secondary | ICD-10-CM | POA: Diagnosis not present

## 2020-12-23 DIAGNOSIS — N1831 Chronic kidney disease, stage 3a: Secondary | ICD-10-CM | POA: Diagnosis not present

## 2020-12-24 ENCOUNTER — Encounter: Payer: Self-pay | Admitting: Internal Medicine

## 2020-12-28 ENCOUNTER — Inpatient Hospital Stay: Payer: Medicare HMO | Attending: Internal Medicine | Admitting: Internal Medicine

## 2020-12-28 ENCOUNTER — Encounter: Payer: Self-pay | Admitting: Internal Medicine

## 2020-12-28 ENCOUNTER — Inpatient Hospital Stay: Payer: Medicare HMO

## 2020-12-28 ENCOUNTER — Telehealth: Payer: Self-pay | Admitting: Internal Medicine

## 2020-12-28 ENCOUNTER — Other Ambulatory Visit: Payer: Self-pay

## 2020-12-28 ENCOUNTER — Ambulatory Visit: Payer: Medicare HMO | Admitting: Internal Medicine

## 2020-12-28 DIAGNOSIS — C186 Malignant neoplasm of descending colon: Secondary | ICD-10-CM

## 2020-12-28 DIAGNOSIS — E875 Hyperkalemia: Secondary | ICD-10-CM

## 2020-12-28 DIAGNOSIS — C182 Malignant neoplasm of ascending colon: Secondary | ICD-10-CM | POA: Insufficient documentation

## 2020-12-28 LAB — BASIC METABOLIC PANEL
Anion gap: 10 (ref 5–15)
BUN: 18 mg/dL (ref 8–23)
CO2: 20 mmol/L — ABNORMAL LOW (ref 22–32)
Calcium: 8.9 mg/dL (ref 8.9–10.3)
Chloride: 102 mmol/L (ref 98–111)
Creatinine, Ser: 0.94 mg/dL (ref 0.44–1.00)
GFR, Estimated: >60 mL/min (ref >60)
Glucose, Bld: 95 mg/dL (ref 70–99)
Potassium: 4.1 mmol/L (ref 3.5–5.1)
Sodium: 132 mmol/L — ABNORMAL LOW (ref 135–145)

## 2020-12-28 NOTE — Progress Notes (Signed)
Returns for follow-up. No new concerns at this time.

## 2020-12-28 NOTE — Assessment & Plan Note (Addendum)
#  Descending colon cancer stage II. I reviewed the stage as stage with patient/husband in detail. Also reviewed the images in detail.  #Had a long discussion with patient regarding risk of recurrence with colon cancer in general.  Stage II colon cancer-has a less chance of recurrence compared to stage III.  And hence adjuvant therapy is usually not recommended.  However I would recommend checking Signatera testing/CT DNA surveillance to help assess risk of response/close surveillance.  I discussed the pros and cons of tumor DNA surveillance.  # Anemia: Hb-10.4- recommend PO iron.   #Left neck Warthin's tumor-awaiting further evaluation/recommendations and recommended.  #History of stroke gait instability-not a very robust candidate for any adjuvant therapy.  Especially oxaliplatin-based therapy as needed.  #Chronic low back pain stable-not related to malignancy.  Thank you for allowing me to participate in the care of your pleasant patient. Please do not hesitate to contact me with questions or concerns in the interim.  # DISPOSITION: mychart # BMP today; along with signetera # follow up in April, 2022-  MD; cbc/iron studies/ferritin-- Dr.B

## 2020-12-28 NOTE — Progress Notes (Signed)
Jenna [name] NOTE  Patient Care Team: Perrin Maltese, MD as PCP - General (Internal Medicine) Kate Sable, MD as PCP - Cardiology (Cardiology) Clent Jacks, RN as Oncology Nurse Navigator  CHIEF COMPLAINTS/PURPOSE OF CONSULTATION: colon cancer   #  Oncology History Overview Note    # Cololoscoy prior- 5.8 prior  Procedure: Subtotal colectomy   TUMOR  Tumor Site: Descending colon  Histologic Type: Adenocarcinoma  Histologic Grade: G2, moderately differentiated  Tumor Size: Greatest dimension: 4.5 cm  Tumor Extent: Invades through the muscularis propria into pericolorectal  tissue, see comment  Macroscopic Tumor Perforation: Not identified, see comment  Lymph-Vascular Invasion: Not identified  Perineural Invasion: Present  Treatment Effect: No known surgical therapy   MARGINS  Margin Status for Invasive Carcinoma: All margins negative for invasive  carcinoma  Margins examined: Proximal, distal, mesenteric  Margin Status for Non-Invasive Tumor: All margins negative for  high-grade dysplasia/intramucosal carcinoma and low-grade dysplasia   REGIONAL LYMPH NODES  Regional Lymph Nodes: All regional lymph nodes negative for tumor  Number of lymph nodes examined: 29  Tumor Deposits: Not identified   PATHOLOGIC STAGE CLASSIFICATION (pTNM, AJCC 8th Edition):  TNM Descriptors: Not applicable  pT3 at least, see comment  pN0  pM - Not applicable   # HLP; Acute Stroke [2021s/p TPA;[facility]]; Gout on allopurinol   Cancer of left colon ([place])  11/30/2020 Initial Diagnosis   Cancer of left colon ([place])   12/29/2020 Cancer Staging   Staging form: Colon and Rectum, AJCC 8th Edition - Pathologic: Stage IIA (pT3, pN0, cM0) - Signed by Cammie Sickle, MD on 12/29/2020 Total positive nodes: 0       HISTORY OF PRESENTING ILLNESS:  walks with cane.  Accompanied by family. Jenna [name] 80 y.o.  female stage II colon cancer is here for  follow-up.  Patient continues to have gait instability using a walker at home.  The surgeries and the wound is healing.  Not resolved.  She continues to have wound VAC-will need for few more weeks.  No nausea no vomiting.  No fever no chills.   Review of Systems  Constitutional:  Positive for malaise/fatigue. Negative for chills, diaphoresis, fever and weight loss.  HENT:  Negative for nosebleeds and sore throat.   Eyes:  Negative for double vision.  Respiratory:  Negative for cough, hemoptysis, sputum production, shortness of breath and wheezing.   Cardiovascular:  Negative for chest pain, palpitations, orthopnea and leg swelling.  Gastrointestinal:  Negative for abdominal pain, blood in stool, constipation, diarrhea, heartburn, melena, nausea and vomiting.  Genitourinary:  Negative for dysuria, frequency and urgency.  Musculoskeletal:  Positive for back pain and joint pain.  Skin: Negative.  Negative for itching and rash.  Neurological:  Positive for weakness. Negative for dizziness, tingling, focal weakness and headaches.  Endo/Heme/Allergies:  Does not bruise/bleed easily.  Psychiatric/Behavioral:  Negative for depression. The patient is not nervous/anxious and does not have insomnia.     MEDICAL HISTORY:  Past Medical History:  Diagnosis Date   BRCA negative 03/2020   MyRisk neg except MSH3 VUS   Colon cancer ([place])    Family history of breast cancer    IBIS=4.4%/riskscore=2.9%   Gout    Hyperlipidemia    Hypertension    Stroke ([place]) 05/2019   Thyroid disease     SURGICAL HISTORY: Past Surgical History:  Procedure Laterality Date   APPLICATION OF WOUND VAC  11/16/2020   Procedure: APPLICATION OF WOUND VAC;  Surgeon: Fredirick Maudlin, MD;  Location: [facility];  Service: General;;  Serial # [id number]   BREAST BIOPSY Right 1978   neg   CHOLECYSTECTOMY     COLECTOMY WITH COLOSTOMY CREATION/HARTMANN PROCEDURE N/A 11/16/2020   Procedure: SUBTOTAL COLECTOMY WITH ILEOSTOMY  CREATION;  Surgeon: Fredirick Maudlin, MD;  Location: [facility];  Service: General;  Laterality: N/A;   LAPAROTOMY N/A 11/16/2020   Procedure: EXPLORATORY LAPAROTOMY;  Surgeon: Fredirick Maudlin, MD;  Location: [facility];  Service: General;  Laterality: N/A;    SOCIAL HISTORY: Social History   Socioeconomic History   Marital status: Married    Spouse name: Not on file   Number of children: Not on file   Years of education: Not on file   Highest education level: Not on file  Occupational History   Not on file  Tobacco Use   Smoking status: Every Day    Packs/day: 0.25    Types: Cigarettes   Smokeless tobacco: Never   Tobacco comments:    smoking 4-5 cigarettes daily.   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 Lives with husband in [place].    Social Determinants of Health   Financial Resource Strain: Not on file  Food Insecurity: Not on file  Transportation Needs: Not on file  Physical Activity: Not on file  Stress: Not on file  Social Connections: Not on file  Intimate Partner Violence: Not on file    FAMILY HISTORY: Family History  Problem Relation Age of Onset   Breast cancer Maternal Grandmother 26   Breast cancer Cousin 40    ALLERGIES:  is allergic to nicotine.  MEDICATIONS:  Current Outpatient Medications  Medication Sig Dispense Refill   Acetaminophen (TYLENOL EXTRA STRENGTH PO) Take 1 tablet by mouth daily at 12 noon.     allopurinol (ZYLOPRIM) 100 MG tablet Take 100 mg by mouth every evening.     aspirin EC 81 MG tablet Take 81 mg by mouth every evening.     Calcium-Magnesium-Zinc 333-133-5 MG TABS Take 1 tablet by mouth daily.     fenofibrate 54 MG tablet Take 54 mg by mouth every evening.     levothyroxine  (SYNTHROID) 50 MCG tablet Take 50 mcg by mouth daily.     Multiple Vitamin (MULTIVITAMIN) tablet Take 1 tablet by mouth daily.     pantoprazole (PROTONIX) 20 MG tablet Take 1 tablet (20 mg total) by mouth daily. 30 tablet 1   rosuvastatin (CRESTOR) 40 MG tablet Take 40 mg by mouth every evening.     No current facility-administered medications for this visit.      Marland Kitchen  PHYSICAL EXAMINATION: ECOG PERFORMANCE STATUS: 1 - Symptomatic but completely ambulatory  Vitals:   12/28/20 1512  BP: 119/79  Pulse: 84  Resp: 18  Temp: 98.7 F (37.1 C)  SpO2: 100%   Filed Weights   12/28/20 1512  Weight: 155 lb (70.3 kg)    Physical Exam Vitals and nursing note reviewed.  HENT:     Head: Normocephalic and atraumatic.     Mouth/Throat:     Pharynx: Oropharynx is clear.  Eyes:     Extraocular Movements: Extraocular movements intact.     Pupils: Pupils are equal, round, and  reactive to light.  Cardiovascular:     Rate and Rhythm: Normal rate and regular rhythm.  Pulmonary:     Comments: Decreased breath sounds bilaterally.  Abdominal:     Palpations: Abdomen is soft.     Comments: Colostomy; woundvac  Musculoskeletal:        General: Normal range of motion.     Cervical back: Normal range of motion.  Skin:    General: Skin is warm.     Comments: Skin rash around the wound.   Neurological:     General: No focal deficit present.     Mental Status: She is alert and oriented to person, place, and time.  Psychiatric:        Behavior: Behavior normal.        Judgment: Judgment normal.    LABORATORY DATA:  I have reviewed the data as listed Lab Results  Component Value Date   WBC 6.5 05/12/2021   HGB 12.7 05/12/2021   HCT 39.2 05/12/2021   MCV 91.6 05/12/2021   PLT 222 05/12/2021   Recent Labs    11/14/20 1153 11/15/20 0528 11/30/20 1301 12/28/20 1551 05/12/21 1437  NA 138   < > 133* 132* 138  K 3.9   < > 5.3* 4.1 4.2  CL 105   < > 99 102 107  CO2 22   < > 23 20* 24   GLUCOSE 135*   < > 108* 95 105*  BUN 17   < > 27* 18 26*  CREATININE 0.98   < > 1.01* 0.94 1.17*  CALCIUM 9.3   < > 9.7 8.9 9.1  GFRNONAA 59*   < > 57* >60 48*  PROT 7.5  --  8.0  --  7.3  ALBUMIN 4.2  --  3.8  --  4.4  AST 16  --  21  --  18  ALT 9  --  17  --  13  ALKPHOS 50  --  47  --  44  BILITOT 0.9  --  0.4  --  0.4   < > = values in this interval not displayed.    RADIOGRAPHIC STUDIES: I have personally reviewed the radiological images as listed and agreed with the findings in the report. CT CHEST ABDOMEN PELVIS W CONTRAST  Result Date: 05/15/2021 CLINICAL DATA:  Colon cancer restaging, status post subtotal colectomy with right lower quadrant end ileostomy, rising tumor marker. EXAM: CT CHEST, ABDOMEN, AND PELVIS WITH CONTRAST TECHNIQUE: Multidetector CT imaging of the chest, abdomen and pelvis was performed following the standard protocol during bolus administration of intravenous contrast. RADIATION DOSE REDUCTION: This exam was performed according to the departmental dose-optimization program which includes automated exposure control, adjustment of the mA and/or kV according to patient size and/or use of iterative reconstruction technique. CONTRAST:  143m OMNIPAQUE IOHEXOL 300 MG/ML SOLN, additional oral enteric contrast COMPARISON:  11/14/2020 FINDINGS: CT CHEST FINDINGS Cardiovascular: Aortic atherosclerosis. Normal heart size. Left coronary artery calcifications. No pericardial effusion. Mediastinum/Nodes: No enlarged mediastinal, hilar, or axillary lymph nodes. Thyroid gland, trachea, and esophagus demonstrate no significant findings. Lungs/Pleura: Finely spiculated nodule of the posterior right upper lobe measuring 0.9 x 0.7 cm (series 3, image 52). 0.3 cm nodule of the anterior right upper lobe (series 3, image 79). Additional benign, incidental calcified nodule of the posterior right upper lobe (series 3, image 43). Background of very fine centrilobular pulmonary nodules,  concentrated in the lung apices. Mild, diffusely mosaic attenuation of the airspaces. Mild,  bandlike scarring of the right middle lobe, lingula, and bilateral lung bases. No pleural effusion or pneumothorax. Musculoskeletal: No chest wall mass or suspicious osseous lesions identified. CT ABDOMEN PELVIS FINDINGS Hepatobiliary: There is a new, faintly rim hypoenhancing subcapsular lesion of the anterior right lobe of the liver, hepatic segment V/VI measuring 1.1 x 0.8 cm (series 2, image 75). Additional tiny low-attenuation lesions of the right lobe of the liver are unchanged, measuring no greater than 0.3 cm, for example in the inferior tip of the right lobe of the liver (series 2, image 86) status post cholecystectomy. No biliary dilatation. Pancreas: Unremarkable. No pancreatic ductal dilatation or surrounding inflammatory changes. Spleen: Normal in size without significant abnormality. Adrenals/Urinary Tract: Adrenal glands are unremarkable. Kidneys are normal, without renal calculi, solid lesion, or hydronephrosis. Bladder is unremarkable. Stomach/Bowel: Stomach is within normal limits. Status post interval subtotal colectomy with right lower quadrant end ileostomy. Severe diverticulosis of the remaining sigmoid colon (series 2, image 106). Vascular/Lymphatic: Aortic atherosclerosis. No enlarged abdominal or pelvic lymph nodes. Reproductive: Status post hysterectomy. Other: No abdominal wall hernia or abnormality. No ascites. Musculoskeletal: No acute osseous findings. Status post left hip total arthroplasty. IMPRESSION: 1. Status post interval subtotal colectomy with right lower quadrant end ileostomy. 2. There is a new, faintly rim hypoenhancing subcapsular lesion of the anterior right lobe of the liver, consistent with a hepatic metastasis. 3. Finely spiculated nodule of the posterior right upper lobe measuring 0.9 x 0.7 cm, as well as a smaller nodule of the right upper lobe measuring 0.3 cm, highly  suspicious for pulmonary metastases. No prior imaging of the chest is available. 4. Background of very fine centrilobular pulmonary nodules, concentrated in the lung apices, most commonly seen in smoking-related respiratory bronchiolitis. 5. Mild, diffusely mosaic attenuation of the airspaces, suggestive of small airways disease. 6. Coronary artery disease. These results will be called to the ordering clinician or representative by the Radiologist Assistant, and communication documented in the PACS or [employer]. Aortic Atherosclerosis (ICD10-I70.0). Electronically Signed   By: Delanna Ahmadi M.D.   On: 05/15/2021 19:54   MM 3D SCREEN BREAST BILATERAL  Result Date: 04/22/2021 CLINICAL DATA:  Screening. EXAM: DIGITAL SCREENING BILATERAL MAMMOGRAM WITH TOMOSYNTHESIS AND CAD TECHNIQUE: Bilateral screening digital craniocaudal and mediolateral oblique mammograms were obtained. Bilateral screening digital breast tomosynthesis was performed. The images were evaluated with computer-aided detection. COMPARISON:  Previous exam(s). ACR Breast Density Category b: There are scattered areas of fibroglandular density. FINDINGS: There are no findings suspicious for malignancy. IMPRESSION: No mammographic evidence of malignancy. A result letter of this screening mammogram will be mailed directly to the patient. RECOMMENDATION: Screening mammogram in one year. (Code:SM-B-01Y) BI-RADS CATEGORY  1: Negative. Electronically Signed   By: Dorise Bullion III M.D.   On: 04/22/2021 13:56   ASSESSMENT & PLAN:   Cancer of left colon ([place]) #Descending colon cancer stage II-discussed regarding the role for adjuvant therapy is controversial.  However given patient's overall poor performance status multiple comorbidities-I think is reasonable to hold any adjuvant chemotherapy.  Recommend Signatera testing for follow-up.  No clinical evidence of any CT DNA recurrence.  # Anemia: Hb-10.4-likely secondary to chronic GI bleed/colonic  malignancy./Surgery.  Recommend PO iron.   #Left neck Warthin's tumor-awaiting further evaluation/recommendations and recommended.  #History of stroke gait instability-not a very robust candidate for any adjuvant therapy.  Especially oxaliplatin-based therapy as needed.  #Chronic low back pain stable-not related to malignancy; reviewed imaging.  # DISPOSITION:  # labs today- cbc/cmp;CEA;  signtera testing.   # follow up in 4 weeks- MD; no labs- Dr.B    All questions were answered. The patient knows to call the clinic with any problems, questions or concerns.       Cammie Sickle, MD 05/17/2021 10:08 AM

## 2020-12-28 NOTE — Progress Notes (Unsigned)
[place] NOTE  Patient Care Team: Perrin Maltese, MD as PCP - General (Internal Medicine) Kate Sable, MD as PCP - Cardiology (Cardiology) Clent Jacks, RN as Oncology Nurse Navigator  CHIEF COMPLAINTS/PURPOSE OF CONSULTATION: colon cancer  #  Oncology History Overview Note    # Cololoscoy prior- 5.8 prior  Procedure: Subtotal colectomy   TUMOR  Tumor Site: Descending colon  Histologic Type: Adenocarcinoma  Histologic Grade: G2, moderately differentiated  Tumor Size: Greatest dimension: 4.5 cm  Tumor Extent: Invades through the muscularis propria into pericolorectal  tissue, see comment  Macroscopic Tumor Perforation: Not identified, see comment  Lymph-Vascular Invasion: Not identified  Perineural Invasion: Present  Treatment Effect: No known surgical therapy   MARGINS  Margin Status for Invasive Carcinoma: All margins negative for invasive  carcinoma  Margins examined: Proximal, distal, mesenteric  Margin Status for Non-Invasive Tumor: All margins negative for  high-grade dysplasia/intramucosal carcinoma and low-grade dysplasia   REGIONAL LYMPH NODES  Regional Lymph Nodes: All regional lymph nodes negative for tumor  Number of lymph nodes examined: 29  Tumor Deposits: Not identified   PATHOLOGIC STAGE CLASSIFICATION (pTNM, AJCC 8th Edition):  TNM Descriptors: Not applicable  pT3 at least, see comment  pN0  pM - Not applicable   # HLP; Acute Stroke [2021s/p TPA;[facility]]; Gout on allopurinol   Cancer of left colon ([place])  11/30/2020 Initial Diagnosis   Cancer of left colon ([place])        Review of Systems  Constitutional:  Positive for malaise/fatigue and weight loss. Negative for chills, diaphoresis and fever.  HENT:  Negative for nosebleeds and sore throat.   Eyes:  Negative for double vision.  Respiratory:  Positive for shortness of breath. Negative for cough, hemoptysis, sputum production and wheezing.   Cardiovascular:   Negative for chest pain, palpitations, orthopnea and leg swelling.  Gastrointestinal:  Negative for abdominal pain, blood in stool, constipation, diarrhea, heartburn, melena, nausea and vomiting.  Genitourinary:  Negative for dysuria, frequency and urgency.  Musculoskeletal:  Positive for back pain and joint pain.  Skin: Negative.  Negative for itching and rash.  Neurological:  Negative for dizziness, tingling, focal weakness, weakness and headaches.  Endo/Heme/Allergies:  Does not bruise/bleed easily.  Psychiatric/Behavioral:  Negative for depression. The patient is not nervous/anxious and does not have insomnia.     MEDICAL HISTORY:  Past Medical History:  Diagnosis Date   BRCA negative 03/2020   MyRisk neg except MSH3 VUS   Colon cancer ([place])    Family history of breast cancer    IBIS=4.4%/riskscore=2.9%   Gout    Hyperlipidemia    Hypertension    Stroke ([place]) 05/2019   Thyroid disease     SURGICAL HISTORY: Past Surgical History:  Procedure Laterality Date   APPLICATION OF WOUND VAC  11/16/2020   Procedure: APPLICATION OF WOUND VAC;  Surgeon: Fredirick Maudlin, MD;  Location: [facility];  Service: General;;  Serial # [id number]   BREAST BIOPSY Right 1978   neg   CHOLECYSTECTOMY     COLECTOMY WITH COLOSTOMY CREATION/HARTMANN PROCEDURE N/A 11/16/2020   Procedure: SUBTOTAL COLECTOMY WITH ILEOSTOMY CREATION;  Surgeon: Fredirick Maudlin, MD;  Location: [facility];  Service: General;  Laterality: N/A;   LAPAROTOMY N/A 11/16/2020   Procedure: EXPLORATORY LAPAROTOMY;  Surgeon: Fredirick Maudlin, MD;  Location: [facility];  Service: General;  Laterality: N/A;    SOCIAL HISTORY: Social History   Socioeconomic History   Marital status: Married    Spouse name: Not  on file   Number of children: Not on file   Years of education: Not on file   Highest education level: Not on file  Occupational History   Not on file  Tobacco Use   Smoking status: Every Day    Packs/day: 0.25    Types:  Cigarettes   Smokeless tobacco: Never   Tobacco comments:    smoking 4-5 cigarettes daily.   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    Social Determinants of Health   Financial Resource Strain: Not on file  Food Insecurity: Not on file  Transportation Needs: Not on file  Physical Activity: Not on file  Stress: Not on file  Social Connections: Not on file  Intimate Partner Violence: Not on file    FAMILY HISTORY: Family History  Problem Relation Age of Onset   Breast cancer Maternal Grandmother 40   Breast cancer Cousin 40    ALLERGIES:  is allergic to nicotine.  MEDICATIONS:  Current Outpatient Medications  Medication Sig Dispense Refill   Acetaminophen (TYLENOL EXTRA STRENGTH PO) Take 1 tablet by mouth daily at 12 noon.     allopurinol (ZYLOPRIM) 100 MG tablet Take 100 mg by mouth every evening.     aspirin EC 81 MG tablet Take 81 mg by mouth every evening.     Calcium-Magnesium-Zinc 333-133-5 MG TABS Take 1 tablet by mouth daily.     fenofibrate 54 MG tablet Take 54 mg by mouth every evening.     levothyroxine (SYNTHROID) 50 MCG tablet Take 50 mcg by mouth daily.     Multiple Vitamin (MULTIVITAMIN) tablet Take 1 tablet by mouth daily.     pantoprazole (PROTONIX) 20 MG tablet Take 1 tablet (20 mg total) by mouth daily. 30 tablet 1   rosuvastatin (CRESTOR) 40 MG tablet Take 40 mg by mouth every evening.     No current facility-administered medications for this visit.      Marland Kitchen  PHYSICAL EXAMINATION: ECOG PERFORMANCE STATUS: 1 - Symptomatic but completely ambulatory  There were no vitals filed for this visit.  There were no vitals filed for this visit.   Physical Exam Vitals and nursing  note reviewed.  HENT:     Head: Normocephalic and atraumatic.     Mouth/Throat:     Pharynx: Oropharynx is clear.  Eyes:     Extraocular Movements: Extraocular movements intact.     Pupils: Pupils are equal, round, and reactive to light.  Cardiovascular:     Rate and Rhythm: Normal rate and regular rhythm.  Pulmonary:     Comments: Decreased breath sounds bilaterally.  Abdominal:     Palpations: Abdomen is soft.     Comments: Colostomy; woundvac  Musculoskeletal:        General: Normal range of motion.     Cervical back: Normal range of motion.  Skin:    General: Skin is warm.     Comments: Skin rash around the wound.   Neurological:     General: No focal deficit present.     Mental Status: She is alert and oriented to person, place, and time.  Psychiatric:        Behavior: Behavior normal.  Judgment: Judgment normal.    LABORATORY DATA:  I have reviewed the data as listed Lab Results  Component Value Date   WBC 8.6 11/30/2020   HGB 10.2 (L) 11/30/2020   HCT 31.7 (L) 11/30/2020   MCV 91.1 11/30/2020   PLT 458 (H) 11/30/2020   Recent Labs    11/14/20 1153 11/15/20 0528 11/19/20 0455 11/30/20 1301 12/28/20 1551  NA 138   < > 135 133* 132*  K 3.9   < > 3.5 5.3* 4.1  CL 105   < > 106 99 102  CO2 22   < > 23 23 20*  GLUCOSE 135*   < > 114* 108* 95  BUN 17   < > 14 27* 18  CREATININE 0.98   < > 0.88 1.01* 0.94  CALCIUM 9.3   < > 8.3* 9.7 8.9  GFRNONAA 59*   < > >60 57* >60  PROT 7.5  --   --  8.0  --   ALBUMIN 4.2  --   --  3.8  --   AST 16  --   --  21  --   ALT 9  --   --  17  --   ALKPHOS 50  --   --  47  --   BILITOT 0.9  --   --  0.4  --    < > = values in this interval not displayed.    RADIOGRAPHIC STUDIES: I have personally reviewed the radiological images as listed and agreed with the findings in the report. No results found.  ASSESSMENT & PLAN:   Cancer of left colon ([place]) #    # Anemia: Hb-10.4- recommend PO iron.   # Warhins  tumor [Dr.benentte]  # Hx of stroke-   # Low back pain-   # DISPOSITION: mychart # BMP today; along with signetera # follow up in April, 2022-  MD; cbc/iron studies/ferritin-- Dr.B    All questions were answered. The patient knows to call the clinic with any problems, questions or concerns.       Cammie Sickle, MD 12/29/2020 8:31 AM

## 2020-12-28 NOTE — Telephone Encounter (Signed)
x

## 2020-12-29 ENCOUNTER — Ambulatory Visit (INDEPENDENT_AMBULATORY_CARE_PROVIDER_SITE_OTHER): Payer: Medicare HMO | Admitting: Physician Assistant

## 2020-12-29 ENCOUNTER — Other Ambulatory Visit: Payer: Self-pay

## 2020-12-29 ENCOUNTER — Encounter: Payer: Self-pay | Admitting: Physician Assistant

## 2020-12-29 VITALS — BP 126/81 | HR 88 | Temp 98.3°F | Ht 64.0 in | Wt 155.0 lb

## 2020-12-29 DIAGNOSIS — Z09 Encounter for follow-up examination after completed treatment for conditions other than malignant neoplasm: Secondary | ICD-10-CM

## 2020-12-29 DIAGNOSIS — C186 Malignant neoplasm of descending colon: Secondary | ICD-10-CM

## 2020-12-29 NOTE — Progress Notes (Signed)
[facility] POST-OP OFFICE VISIT  12/29/2020  HPI: Jenna [name] is a 80 y.o. female 1.5 months s/p exploratory laparotomy, subtotal colectomy, and ileostomy creation for obstructing colon mass which proved to be adenocarcinoma with 0/29 lymph nodes.   Since her last visit, she continues doing well. She denied any fever, chills, nausea, emesis, nor abdominal pain. Her midline wound has closed completely. She is having good ostomy function and able to maintain output and avoid dehydration. She followed up with oncology yesterday (09/26) and there is plan for surveillance with labs every 2 months and follow up in 6 months. No other new issues.   Vital signs: BP 126/81   Pulse 88   Temp 98.3 F (36.8 C)   Ht 5\' 4"  (1.626 m)   Wt 155 lb (70.3 kg)   SpO2 98%   BMI 26.61 kg/m    Physical Exam: Constitutional: Well appearing female, NAD Abdomen: Soft, non-tender, non-distended, no rebound/guarding. Ileostomy in the RLQ is pink, patent, liquid stool in bag Skin: Midline wound has healed via secondary intention  Assessment/Plan: This is a 80 y.o. female 1.5 months s/p exploratory laparotomy, subtotal colectomy, and ileostomy creation for obstructing colon mass which proved to be adenocarcinoma with 0/29 lymph nodes.    - Nothing further to add from the surgery perspective at this time.   - We will plan to follow up with her in ~6 months for recheck and to consider reversal should she be amenable.   -- Edison Simon, PA-C [facility] 12/29/2020, 1:56 PM [phone] M-F: 7am - 4pm

## 2020-12-29 NOTE — Patient Instructions (Addendum)
You may try making the barrier wafer smaller to help protect the skin. Or you may also use stoma paste to help protect the skin.   Follow up here in 5 months. We will send you a letter about this appointment.   You may start to exercise and start slow and work your way up.

## 2020-12-30 DIAGNOSIS — C19 Malignant neoplasm of rectosigmoid junction: Secondary | ICD-10-CM | POA: Diagnosis not present

## 2020-12-30 DIAGNOSIS — Z4801 Encounter for change or removal of surgical wound dressing: Secondary | ICD-10-CM | POA: Diagnosis not present

## 2020-12-30 DIAGNOSIS — Z432 Encounter for attention to ileostomy: Secondary | ICD-10-CM | POA: Diagnosis not present

## 2020-12-30 DIAGNOSIS — I129 Hypertensive chronic kidney disease with stage 1 through stage 4 chronic kidney disease, or unspecified chronic kidney disease: Secondary | ICD-10-CM | POA: Diagnosis not present

## 2020-12-30 DIAGNOSIS — M1030 Gout due to renal impairment, unspecified site: Secondary | ICD-10-CM | POA: Diagnosis not present

## 2020-12-30 DIAGNOSIS — Z483 Aftercare following surgery for neoplasm: Secondary | ICD-10-CM | POA: Diagnosis not present

## 2020-12-30 DIAGNOSIS — N179 Acute kidney failure, unspecified: Secondary | ICD-10-CM | POA: Diagnosis not present

## 2020-12-30 DIAGNOSIS — N1831 Chronic kidney disease, stage 3a: Secondary | ICD-10-CM | POA: Diagnosis not present

## 2020-12-30 DIAGNOSIS — C7989 Secondary malignant neoplasm of other specified sites: Secondary | ICD-10-CM | POA: Diagnosis not present

## 2021-01-06 ENCOUNTER — Other Ambulatory Visit: Payer: Self-pay

## 2021-01-06 ENCOUNTER — Inpatient Hospital Stay: Payer: Medicare HMO | Attending: Internal Medicine | Admitting: Hospice and Palliative Medicine

## 2021-01-06 DIAGNOSIS — C186 Malignant neoplasm of descending colon: Secondary | ICD-10-CM

## 2021-01-06 NOTE — Progress Notes (Signed)
Multidisciplinary Oncology Council Documentation  CHANYA CHRISLEY was presented by our [facility] on 01/06/2021, which included representatives from:  Palliative Care Dietitian  Physical/Occupational Therapist Nurse Navigator Genetics Speech Therapist Social work Survivorship RN Hotel manager Research RN Sara Lee currently presents with history of CRC  We reviewed previous medical and familial history, history of present illness, and recent lab results along with all available histopathologic and imaging studies. The [place] considered available treatment options and made the following recommendations/referrals:  Monitor for needs.  The MOC is a meeting of clinicians from various specialty areas who evaluate and discuss patients for whom a multidisciplinary approach is being considered. Final determinations in the plan of care are those of the provider(s).   Today's extended care, comprehensive team conference, Laneice was not present for the discussion and was not examined.

## 2021-01-15 ENCOUNTER — Encounter: Payer: Self-pay | Admitting: *Deleted

## 2021-01-15 DIAGNOSIS — Z85038 Personal history of other malignant neoplasm of large intestine: Secondary | ICD-10-CM | POA: Diagnosis not present

## 2021-01-15 DIAGNOSIS — Z933 Colostomy status: Secondary | ICD-10-CM | POA: Diagnosis not present

## 2021-01-19 ENCOUNTER — Encounter: Payer: Self-pay | Admitting: Internal Medicine

## 2021-01-22 ENCOUNTER — Encounter: Payer: Self-pay | Admitting: General Surgery

## 2021-02-08 ENCOUNTER — Inpatient Hospital Stay: Payer: Medicare HMO | Attending: Internal Medicine

## 2021-02-08 ENCOUNTER — Other Ambulatory Visit: Payer: Self-pay

## 2021-02-08 DIAGNOSIS — C186 Malignant neoplasm of descending colon: Secondary | ICD-10-CM | POA: Diagnosis not present

## 2021-02-15 DIAGNOSIS — D110 Benign neoplasm of parotid gland: Secondary | ICD-10-CM | POA: Diagnosis not present

## 2021-02-19 DIAGNOSIS — E782 Mixed hyperlipidemia: Secondary | ICD-10-CM | POA: Diagnosis not present

## 2021-02-19 DIAGNOSIS — E039 Hypothyroidism, unspecified: Secondary | ICD-10-CM | POA: Diagnosis not present

## 2021-02-19 DIAGNOSIS — R0602 Shortness of breath: Secondary | ICD-10-CM | POA: Diagnosis not present

## 2021-02-19 DIAGNOSIS — I10 Essential (primary) hypertension: Secondary | ICD-10-CM | POA: Diagnosis not present

## 2021-02-19 DIAGNOSIS — Z85038 Personal history of other malignant neoplasm of large intestine: Secondary | ICD-10-CM | POA: Diagnosis not present

## 2021-02-23 DIAGNOSIS — R7301 Impaired fasting glucose: Secondary | ICD-10-CM | POA: Diagnosis not present

## 2021-02-23 DIAGNOSIS — E039 Hypothyroidism, unspecified: Secondary | ICD-10-CM | POA: Diagnosis not present

## 2021-03-01 ENCOUNTER — Telehealth: Payer: Self-pay | Admitting: Internal Medicine

## 2021-03-01 ENCOUNTER — Telehealth: Payer: Self-pay

## 2021-03-01 NOTE — Telephone Encounter (Signed)
I called patient twice and each time phone would ring and then answer but immediatly hang up. Will try again.

## 2021-03-01 NOTE — Telephone Encounter (Signed)
Z/H- Please inform patient that her blood work for cancer marker is normal/negative. Keep up with blood work/labs as ordered. Thanks GB

## 2021-03-01 NOTE — Telephone Encounter (Signed)
MyChart message sent to patient.

## 2021-03-03 ENCOUNTER — Encounter: Payer: Self-pay | Admitting: Internal Medicine

## 2021-03-11 DIAGNOSIS — I129 Hypertensive chronic kidney disease with stage 1 through stage 4 chronic kidney disease, or unspecified chronic kidney disease: Secondary | ICD-10-CM | POA: Diagnosis not present

## 2021-03-11 DIAGNOSIS — R300 Dysuria: Secondary | ICD-10-CM | POA: Diagnosis not present

## 2021-03-11 DIAGNOSIS — N1831 Chronic kidney disease, stage 3a: Secondary | ICD-10-CM | POA: Diagnosis not present

## 2021-03-15 ENCOUNTER — Other Ambulatory Visit: Payer: Self-pay | Admitting: Internal Medicine

## 2021-03-15 DIAGNOSIS — Z1231 Encounter for screening mammogram for malignant neoplasm of breast: Secondary | ICD-10-CM

## 2021-03-16 DIAGNOSIS — E875 Hyperkalemia: Secondary | ICD-10-CM | POA: Diagnosis not present

## 2021-03-16 DIAGNOSIS — N1831 Chronic kidney disease, stage 3a: Secondary | ICD-10-CM | POA: Diagnosis not present

## 2021-03-16 DIAGNOSIS — I129 Hypertensive chronic kidney disease with stage 1 through stage 4 chronic kidney disease, or unspecified chronic kidney disease: Secondary | ICD-10-CM | POA: Diagnosis not present

## 2021-04-06 ENCOUNTER — Other Ambulatory Visit: Payer: Self-pay

## 2021-04-06 ENCOUNTER — Inpatient Hospital Stay: Payer: Medicare HMO | Attending: Internal Medicine

## 2021-04-06 DIAGNOSIS — C186 Malignant neoplasm of descending colon: Secondary | ICD-10-CM | POA: Diagnosis not present

## 2021-04-20 ENCOUNTER — Encounter: Payer: Self-pay | Admitting: Internal Medicine

## 2021-04-22 ENCOUNTER — Ambulatory Visit
Admission: RE | Admit: 2021-04-22 | Discharge: 2021-04-22 | Disposition: A | Discharge status: Home / Self Care | Payer: Medicare HMO | Source: Ambulatory Visit | Attending: Internal Medicine | Admitting: Internal Medicine

## 2021-04-22 ENCOUNTER — Other Ambulatory Visit: Payer: Self-pay

## 2021-04-22 DIAGNOSIS — Z1231 Encounter for screening mammogram for malignant neoplasm of breast: Secondary | ICD-10-CM | POA: Insufficient documentation

## 2021-04-26 NOTE — Assessment & Plan Note (Addendum)
#  Descending colon cancer stage II-discussed regarding the role for adjuvant therapy is controversial.  However given patient's overall poor performance status multiple comorbidities-I think is reasonable to hold any adjuvant chemotherapy.  Recommend Signatera testing for follow-up.  No clinical evidence of any CT DNA recurrence.  # Anemia: Hb-10.4-likely secondary to chronic GI bleed/colonic malignancy./Surgery.  Recommend PO iron.   #Left neck Warthin's tumor-awaiting further evaluation/recommendations and recommended.  #History of stroke gait instability-not a very robust candidate for any adjuvant therapy.  Especially oxaliplatin-based therapy as needed.  #Chronic low back pain stable-not related to malignancy; reviewed imaging.  # DISPOSITION:  # labs today- cbc/cmp;CEA; signtera testing.   # follow up in 4 weeks- MD; no labs- Dr.B

## 2021-04-27 ENCOUNTER — Telehealth: Payer: Self-pay | Admitting: Internal Medicine

## 2021-04-27 NOTE — Telephone Encounter (Signed)
Pt called to get the results to her lab work that was done on 1-3. Call back at [phone]

## 2021-04-27 NOTE — Telephone Encounter (Signed)
Signatera results collected 04/06/21 in chart available for review.

## 2021-04-28 ENCOUNTER — Other Ambulatory Visit: Payer: Self-pay | Admitting: Internal Medicine

## 2021-04-28 DIAGNOSIS — C186 Malignant neoplasm of descending colon: Secondary | ICD-10-CM

## 2021-04-28 NOTE — Progress Notes (Signed)
I spoke to patient regarding the results of the tumor marker/signetera testing-concerning for possible cancer recurrence.  Recommend CT scan chest and pelvis ASAP; patient agreement.  C-please schedule follow-up-MD; labs CBC CMP CEA; CT scan prior- Thanks. GB

## 2021-04-28 NOTE — Addendum Note (Signed)
Addended by: Vanice Sarah on: 04/28/2021 12:57 PM   Modules accepted: Orders

## 2021-04-30 ENCOUNTER — Encounter (INDEPENDENT_AMBULATORY_CARE_PROVIDER_SITE_OTHER): Payer: Self-pay

## 2021-05-12 ENCOUNTER — Other Ambulatory Visit: Payer: Self-pay

## 2021-05-12 ENCOUNTER — Inpatient Hospital Stay: Payer: Medicare HMO | Attending: Internal Medicine

## 2021-05-12 DIAGNOSIS — C184 Malignant neoplasm of transverse colon: Secondary | ICD-10-CM | POA: Insufficient documentation

## 2021-05-12 DIAGNOSIS — C186 Malignant neoplasm of descending colon: Secondary | ICD-10-CM

## 2021-05-12 LAB — COMPREHENSIVE METABOLIC PANEL
ALT: 13 U/L (ref 0–44)
AST: 18 U/L (ref 15–41)
Albumin: 4.4 g/dL (ref 3.5–5.0)
Alkaline Phosphatase: 44 U/L (ref 38–126)
Anion gap: 7 (ref 5–15)
BUN: 26 mg/dL — ABNORMAL HIGH (ref 8–23)
CO2: 24 mmol/L (ref 22–32)
Calcium: 9.1 mg/dL (ref 8.9–10.3)
Chloride: 107 mmol/L (ref 98–111)
Creatinine, Ser: 1.17 mg/dL — ABNORMAL HIGH (ref 0.44–1.00)
GFR, Estimated: 48 mL/min — ABNORMAL LOW (ref >60)
Glucose, Bld: 105 mg/dL — ABNORMAL HIGH (ref 70–99)
Potassium: 4.2 mmol/L (ref 3.5–5.1)
Sodium: 138 mmol/L (ref 135–145)
Total Bilirubin: 0.4 mg/dL (ref 0.3–1.2)
Total Protein: 7.3 g/dL (ref 6.5–8.1)

## 2021-05-12 LAB — CBC WITH DIFFERENTIAL/PLATELET
Abs Immature Granulocytes: 0.01 10*3/uL (ref 0.00–0.07)
Basophils Absolute: 0.1 10*3/uL (ref 0.0–0.1)
Basophils Relative: 1 %
Eosinophils Absolute: 0.2 10*3/uL (ref 0.0–0.5)
Eosinophils Relative: 3 %
HCT: 39.2 % (ref 36.0–46.0)
Hemoglobin: 12.7 g/dL (ref 12.0–15.0)
Immature Granulocytes: 0 %
Lymphocytes Relative: 34 %
Lymphs Abs: 2.2 10*3/uL (ref 0.7–4.0)
MCH: 29.7 pg (ref 26.0–34.0)
MCHC: 32.4 g/dL (ref 30.0–36.0)
MCV: 91.6 fL (ref 80.0–100.0)
Monocytes Absolute: 0.4 10*3/uL (ref 0.1–1.0)
Monocytes Relative: 6 %
Neutro Abs: 3.6 10*3/uL (ref 1.7–7.7)
Neutrophils Relative %: 56 %
Platelets: 222 10*3/uL (ref 150–400)
RBC: 4.28 MIL/uL (ref 3.87–5.11)
RDW: 15.1 % (ref 11.5–15.5)
WBC: 6.5 10*3/uL (ref 4.0–10.5)
nRBC: 0 % (ref 0.0–0.2)

## 2021-05-13 LAB — CEA: CEA: 6 ng/mL — ABNORMAL HIGH (ref 0.0–4.7)

## 2021-05-14 ENCOUNTER — Ambulatory Visit
Admission: RE | Admit: 2021-05-14 | Discharge: 2021-05-14 | Disposition: A | Discharge status: Home / Self Care | Payer: Medicare HMO | Source: Ambulatory Visit | Attending: Internal Medicine | Admitting: Internal Medicine

## 2021-05-14 ENCOUNTER — Other Ambulatory Visit: Payer: Self-pay

## 2021-05-14 DIAGNOSIS — C186 Malignant neoplasm of descending colon: Secondary | ICD-10-CM | POA: Diagnosis not present

## 2021-05-14 DIAGNOSIS — C189 Malignant neoplasm of colon, unspecified: Secondary | ICD-10-CM | POA: Diagnosis not present

## 2021-05-14 DIAGNOSIS — J984 Other disorders of lung: Secondary | ICD-10-CM | POA: Diagnosis not present

## 2021-05-14 DIAGNOSIS — I700 Atherosclerosis of aorta: Secondary | ICD-10-CM | POA: Diagnosis not present

## 2021-05-14 DIAGNOSIS — K7689 Other specified diseases of liver: Secondary | ICD-10-CM | POA: Diagnosis not present

## 2021-05-14 DIAGNOSIS — K5730 Diverticulosis of large intestine without perforation or abscess without bleeding: Secondary | ICD-10-CM | POA: Diagnosis not present

## 2021-05-14 DIAGNOSIS — I2510 Atherosclerotic heart disease of native coronary artery without angina pectoris: Secondary | ICD-10-CM | POA: Diagnosis not present

## 2021-05-14 DIAGNOSIS — J219 Acute bronchiolitis, unspecified: Secondary | ICD-10-CM | POA: Diagnosis not present

## 2021-05-14 MED ORDER — IOHEXOL 300 MG/ML  SOLN
100.0000 mL | Freq: Once | INTRAMUSCULAR | Status: AC | PRN
  Administered 2021-05-14: 100 mL via INTRAVENOUS

## 2021-05-17 ENCOUNTER — Other Ambulatory Visit: Payer: Self-pay

## 2021-05-17 ENCOUNTER — Encounter: Payer: Self-pay | Admitting: Internal Medicine

## 2021-05-17 ENCOUNTER — Inpatient Hospital Stay: Payer: Medicare HMO | Admitting: Internal Medicine

## 2021-05-17 ENCOUNTER — Telehealth: Payer: Self-pay | Admitting: *Deleted

## 2021-05-17 VITALS — BP 139/92 | HR 84 | Temp 97.2°F | Resp 16 | Wt 161.0 lb

## 2021-05-17 DIAGNOSIS — C184 Malignant neoplasm of transverse colon: Secondary | ICD-10-CM | POA: Diagnosis not present

## 2021-05-17 DIAGNOSIS — C186 Malignant neoplasm of descending colon: Secondary | ICD-10-CM | POA: Diagnosis not present

## 2021-05-17 MED ORDER — PROCHLORPERAZINE MALEATE 10 MG PO TABS: 10.0000 mg | ORAL_TABLET | Freq: Four times a day (QID) | ORAL | 1 refills | Status: DC | PRN

## 2021-05-17 MED ORDER — ONDANSETRON HCL 8 MG PO TABS: ORAL_TABLET | ORAL | 1 refills | Status: DC

## 2021-05-17 NOTE — Telephone Encounter (Signed)
IMPRESSION: 1. Status post interval subtotal colectomy with right lower quadrant end ileostomy. 2. There is a new, faintly rim hypoenhancing subcapsular lesion of the anterior right lobe of the liver, consistent with a hepatic metastasis. 3. Finely spiculated nodule of the posterior right upper lobe measuring 0.9 x 0.7 cm, as well as a smaller nodule of the right upper lobe measuring 0.3 cm, highly suspicious for pulmonary metastases. No prior imaging of the chest is available. 4. Background of very fine centrilobular pulmonary nodules, concentrated in the lung apices, most commonly seen in smoking-related respiratory bronchiolitis. 5. Mild, diffusely mosaic attenuation of the airspaces, suggestive of small airways disease. 6. Coronary artery disease.   These results will be called to the ordering clinician or representative by the Radiologist Assistant, and communication documented in the PACS or [employer].   Aortic Atherosclerosis (ICD10-I70.0).     Electronically Signed   By: Delanna Ahmadi M.D.   On: 05/15/2021 19:54

## 2021-05-17 NOTE — Assessment & Plan Note (Addendum)
#  Recurrent/stage IV left colon cancer - CT scan- 2/11/2-23- Status post interval subtotal colectomy with right lower quadrant end ileostomy. There is a new, faintly rim hypoenhancing subcapsular lesion of the anterior right lobe of the liver, consistent with a hepatic Metastasis.  Finely spiculated nodule of the posterior right upper lobe measuring 0.9 x 0.7 cm, as well as a smaller nodule of the right upper lobe measuring 0.3 cm, highly suspicious for pulmonary metastases.  Recommend a PET scan for further evaluation.  #Iron Long discussion with the patient regarding the significant concerns of recurrent malignancy based on imaging/blood work.  If patient noted to have metastatic disease would recommend systemic chemotherapy.  #History of stroke gait instability [2021 s/p TPA]-monitor closely on chemotherapy.  Ineligible for bevacizumab  #Chronic low back pain stable-not related to malignancy; reviewed imaging.  #Incidental findings on Imaging-CT scan February 2023 -background of very fine centrilobular pulmonary nodules, concentrated in the lung apices suggestive of smoking-related respiratory bronchiolitis/airway reactive disease;  Coronary artery disease.: I reviewed/discussed/counseled the patient.   *NGS  # DISPOSITION:  # PET scan ASAP- ok to travel to [place] # chemo education- FOLFOX #  follow up in 3 weeks- MD; labs- cbc/cmp; CAE; 5FU pump ONLY; off on D-3.  Dr.B  # I reviewed the blood work- with the patient in detail; also reviewed the imaging independently [as summarized above]; and with the patient in detail.

## 2021-05-17 NOTE — Progress Notes (Signed)
Patient here for oncology follow-up appointment, expresses no complaints or concerns at this time.    

## 2021-05-20 ENCOUNTER — Ambulatory Visit
Admission: RE | Admit: 2021-05-20 | Discharge: 2021-05-20 | Disposition: A | Discharge status: Home / Self Care | Payer: Medicare HMO | Source: Ambulatory Visit | Attending: Internal Medicine | Admitting: Internal Medicine

## 2021-05-20 DIAGNOSIS — R911 Solitary pulmonary nodule: Secondary | ICD-10-CM | POA: Diagnosis not present

## 2021-05-20 DIAGNOSIS — C787 Secondary malignant neoplasm of liver and intrahepatic bile duct: Secondary | ICD-10-CM | POA: Insufficient documentation

## 2021-05-20 DIAGNOSIS — J8410 Pulmonary fibrosis, unspecified: Secondary | ICD-10-CM | POA: Insufficient documentation

## 2021-05-20 DIAGNOSIS — K118 Other diseases of salivary glands: Secondary | ICD-10-CM | POA: Insufficient documentation

## 2021-05-20 DIAGNOSIS — C186 Malignant neoplasm of descending colon: Secondary | ICD-10-CM | POA: Insufficient documentation

## 2021-05-20 DIAGNOSIS — I700 Atherosclerosis of aorta: Secondary | ICD-10-CM | POA: Diagnosis not present

## 2021-05-20 LAB — GLUCOSE, CAPILLARY: Glucose-Capillary: 100 mg/dL — ABNORMAL HIGH (ref 70–99)

## 2021-05-20 MED ORDER — FLUDEOXYGLUCOSE F - 18 (FDG) INJECTION
8.3000 | Freq: Once | INTRAVENOUS | Status: AC
  Administered 2021-05-20: 8.76 via INTRAVENOUS

## 2021-05-21 ENCOUNTER — Encounter: Payer: Self-pay | Admitting: Internal Medicine

## 2021-05-21 NOTE — Progress Notes (Signed)
I call patient to discuss the results of the pet scan however, unable to reach. I left a voicemail to call us back to discuss the results

## 2021-05-24 ENCOUNTER — Telehealth: Payer: Self-pay | Admitting: Internal Medicine

## 2021-05-24 ENCOUNTER — Telehealth: Payer: Self-pay | Admitting: *Deleted

## 2021-05-24 NOTE — Telephone Encounter (Signed)
Patient called to say she missed Dr. Andre Lefort call on Friday and would like him to call her back.

## 2021-05-24 NOTE — Telephone Encounter (Signed)
I spoke to pt re: results of the PET scan. With re: PET uptake-left parotid [patient states that she had previous biopsy-with Dr. Richardson Landry; also sinonasal.  Will discuss with Dr. Richardson Landry.  Patient aware of the liver lesion/lung lesions.  Will discuss with Dr. Adora Fridge to see if her appointment can be moved up/port placement.  Chemo planned on March 6th.  GB

## 2021-05-26 ENCOUNTER — Encounter: Payer: Self-pay | Admitting: Surgery

## 2021-05-26 ENCOUNTER — Other Ambulatory Visit: Payer: Self-pay

## 2021-05-26 ENCOUNTER — Ambulatory Visit (INDEPENDENT_AMBULATORY_CARE_PROVIDER_SITE_OTHER): Payer: Medicare HMO | Admitting: Surgery

## 2021-05-26 VITALS — BP 137/74 | HR 89 | Temp 98.2°F | Ht 64.0 in | Wt 158.8 lb

## 2021-05-26 DIAGNOSIS — C186 Malignant neoplasm of descending colon: Secondary | ICD-10-CM | POA: Diagnosis not present

## 2021-05-26 NOTE — Patient Instructions (Signed)
PLEASE STOP YOUR ASPIRIN ON 05/29/2021.  Our surgery scheduler Pamala Hurry will call you within 24-48 hours to get you scheduled. If you have not heard from her after 48 hours, please call our office. You will not need to get Covid tested before surgery and have the blue sheet available when she calls to write down important information.  If you have any concerns or questions, please feel free to call our office.   [employer], Adult A central line is a long, thin tube (catheter) that is put into a vein so that it goes to a large vein above your heart. It can be used to: Give you medicine or fluids. Give you food and nutrients. Take blood or give you blood for testing or treatments. Types of central lines There are four main types of central lines: Peripherally inserted central catheter (PICC) line. This type is usually put in the upper arm and goes up the arm to the heart. Tunneled central line. This type is placed in a large vein in the neck, chest, or groin. It is tunneled under the skin and brought out through a second incision. Non-tunneled central line. This type is used for a shorter time than other types, usually for 7 days at the most. It is inserted in the neck, chest, or groin. Implanted port. This type can stay in place longer than other types of central lines. It is normally put in the upper chest but can also be placed in the upper arm or the belly. Surgery is needed to put it in and take it out. The type of central line you get will depend on how long you need it and your medical condition. Tell a doctor about: Any allergies you have. All medicines you are taking. These include vitamins, herbs, eye drops, creams, and over-the-counter medicines. Any problems you or family members have had with anesthetic medicines. Any blood disorders you have. Any surgeries you have had. Any medical conditions you have. Whether you are pregnant or may be pregnant. What are the risks? Generally,  central lines are safe. However, problems may occur, including: Infection. A blood clot. Bleeding from the place where the central line was inserted. Getting a hole or crack in the central line. If this happens, the central line will need to be replaced. Central line failure. The catheter moving or coming out of place. What happens before the procedure? Medicines Ask your doctor about changing or stopping: Your normal medicines. Vitamins, herbs, and supplements. Over-the-counter medicines. Do not take aspirin or ibuprofen unless you are told to. General instructions Follow instructions from your doctor about eating or drinking. For your safety, your doctor may: Elta [place] the area of the procedure. Remove hair at the procedure site. Ask you to wash with a soap that kills germs. Plan to have a responsible adult take you home from the hospital or clinic. If you will be going home right after the procedure, plan to have a responsible adult care for you for the time you are told. This is important. What happens during the procedure? An IV tube will be put into one of your veins. You may be given: A sedative. This medicine helps you relax. Anesthetics. These medicines numb certain areas of your body. Your skin will be cleaned with a germ-killing (antiseptic) solution. You may be covered with clean drapes. Your blood pressure, heart rate, breathing rate, and blood oxygen level will be monitored during the procedure. The central line will be put into the vein  and moved through it to the correct spot. The doctor may use X-ray equipment to help guide the central line to the right place. A bandage (dressing) will be placed over the insertion area. The procedure may vary among doctors and hospitals. What can I expect after the procedure? You will be monitored until you leave the hospital or clinic. This includes checking your blood pressure, heart rate, breathing rate, and blood oxygen level. Caps  may be placed on the ends of the central line tubing. If you were given a sedative during your procedure, do not drive or use machines until your doctor says that it is safe. Follow these instructions at home: Caring for the tube  Follow instructions from your doctor about: Flushing the tube. Cleaning the tube and the area around it. Only use germ-free (sterile) supplies to flush. The supplies should be from your doctor, a pharmacy, or another place that your doctor recommends. Before you flush the tube or clean the area around the tube: Wash your hands with soap and water for at least 20 seconds. If you cannot use soap and water, use hand sanitizer. Clean the central line hub with rubbing alcohol. To do this: Scrub it using a twisting motion and rub for 10 to 15 seconds or for 30 twists. Follow the manufacturer's instructions. Be sure you scrub the top of the hub, not just the sides. Never reuse alcohol pads. Let the hub dry before use. Keep it from touching anything while drying. Caring for your skin Check the skin around the central line every day for signs of infection. Check for: Redness, swelling, or pain. Fluid or blood. Warmth. Pus or a bad smell. Keep the area where the tube was put in clean and dry. Change bandages only as told by your doctor. Keep your bandage dry. If a bandage gets wet, have it changed right away. General instructions Keep the tube clamped, unless it is being used. If you or someone else accidentally pulls on the tube, make sure: The bandage is okay. There is no bleeding. The tube has not been pulled out. Do not use scissors or sharp objects near the tube. Do not take baths, swim, or use a hot tub until your doctor says it is okay. Ask your doctor if you may take showers. You may only be allowed to take sponge baths. Ask your doctor what activities are safe for you. Your doctor may tell you not to lift anything or move your arm too much. Take  over-the-counter and prescription medicines only as told by your doctor. Keep all follow-up visits. Storing and throwing away supplies Keep your supplies in a clean, dry location. Throw away any used syringes in a container that is only for sharp items (sharps container). You can buy a sharps container from a pharmacy, or you can make one by using an empty hard plastic bottle with a cover. Place any used bandages or infusion bags into a plastic bag. Throw that bag in the trash. Contact a doctor if: You have any of these signs of infection where the tube was put in: Redness, swelling, or pain. Fluid or blood. Warmth. Pus or a bad smell. Get help right away if: You have: A fever or chills. Shortness of breath. Pain in your chest. A fast heartbeat. Swelling in your neck, face, chest, or arm. You feel dizzy or you faint. There are red lines coming from where the tube was put in. The area where the tube was put in  is bleeding and the bleeding will not stop. Your tube is hard to flush. You do not get a blood return from the tube. The tube gets loose or comes out. The tube has a hole or a tear. The tube leaks. Summary A central line is a long, thin tube (catheter) that is put in your vein. It can be used to give you medicine, food, or fluids. Follow instructions from your doctor about flushing and cleaning the tube. Keep the area where the tube was put in clean and dry. Ask your doctor what activities are safe for you. This information is not intended to replace advice given to you by your health care provider. Make sure you discuss any questions you have with your health care provider. Document Revised: 11/21/2019 Document Reviewed: 11/21/2019 Elsevier Patient Education  2022 [employer].

## 2021-05-27 ENCOUNTER — Encounter: Payer: Self-pay | Admitting: Surgery

## 2021-05-27 ENCOUNTER — Telehealth: Payer: Self-pay | Admitting: Surgery

## 2021-05-27 NOTE — H&P (View-Only) (Signed)
Outpatient Surgical Follow Up  05/27/2021  Jenna [name] is an 81 y.o. female.   Chief Complaint  Patient presents with   Follow-up    Colectomy with colostomy 11/16/20    HPI: Jenna [name] is a 81 year old female with known history of adenocarcinoma of the descending colon causing large bowel obstruction requiring emergent laparotomy and subtotal colectomy with end ileostomy by Dr. Celine Ahr on August 2022, she did Perforate.  She had 29 nodes that were negative for metastatic disease.  She has been followed by oncology and recent PET/CT is consistent with metastatic disease to the liver.  There is also lung lesion that might be primary versus metastasis.  She is planning to have chemotherapy on March 7.  I have discussed the case in detail with Dr. Rogue Bussing. She will require port placement. She is doing well otherwise, taking po no N/V no abd pain. Ostomy working Please note I have pers reviewed PE CT. She also had significant increase of CEA.  Past Medical History:  Diagnosis Date   BRCA negative 03/2020   MyRisk neg except MSH3 VUS   Colon cancer [facility])    Family history of breast cancer    IBIS=4.4%/riskscore=2.9%   Gout    Hyperlipidemia    Hypertension    Stroke ([place]) 05/2019   Thyroid disease     Past Surgical History:  Procedure Laterality Date   APPLICATION OF WOUND VAC  11/16/2020   Procedure: APPLICATION OF WOUND VAC;  Surgeon: Fredirick Maudlin, MD;  Location: [facility];  Service: General;;  Serial # [id number]   BREAST BIOPSY Right 1978   neg   CHOLECYSTECTOMY     COLECTOMY WITH COLOSTOMY CREATION/HARTMANN PROCEDURE N/A 11/16/2020   Procedure: SUBTOTAL COLECTOMY WITH ILEOSTOMY CREATION;  Surgeon: Fredirick Maudlin, MD;  Location: [facility];  Service: General;  Laterality: N/A;   LAPAROTOMY N/A 11/16/2020   Procedure: EXPLORATORY LAPAROTOMY;  Surgeon: Fredirick Maudlin, MD;  Location: [facility];  Service: General;  Laterality: N/A;    Family History  Problem Relation Age of  Onset   Breast cancer Maternal Grandmother 66   Breast cancer Cousin 57    Social History:  reports that she has been smoking cigarettes. She has been smoking an average of .25 packs per day. She has never used smokeless tobacco. She reports that she does not drink alcohol and does not use drugs.  Allergies:  Allergies  Allergen Reactions   Nicotine Rash    Medications reviewed.    ROS Full ROS performed and is otherwise negative other than what is stated in HPI   BP 137/74    Pulse 89    Temp 98.2 F (36.8 C) (Oral)    Ht 5' 4" (1.626 m)    Wt 158 lb 12.8 oz (72 kg)    SpO2 96%    BMI 27.26 kg/m   Physical Exam Vitals and nursing note reviewed. Exam conducted with a chaperone present.  Constitutional:      General: She is not in acute distress.    Appearance: Normal appearance.  Cardiovascular:     Rate and Rhythm: Normal rate and regular rhythm.  Pulmonary:     Effort: Pulmonary effort is normal. No respiratory distress.     Breath sounds: Normal breath sounds. No stridor. No rhonchi.  Abdominal:     General: Abdomen is flat. There is no distension.     Palpations: Abdomen is soft. There is no mass.     Tenderness: There is no abdominal tenderness.  There is no guarding or rebound.  °   Hernia: No hernia is present.  °Musculoskeletal:     °   General: No swelling or tenderness. Normal range of motion.  °   Cervical back: Normal range of motion and neck supple. No rigidity or tenderness.  °Lymphadenopathy:  °   Cervical: No cervical adenopathy.  °Skin: °   General: Skin is warm and dry.  °   Capillary Refill: Capillary refill takes less than 2 seconds.  °   Coloration: Skin is not jaundiced.  °Neurological:  °   General: No focal deficit present.  °   Mental Status: She is alert and oriented to person, place, and time.  °Psychiatric:     °   Mood and Affect: Mood normal.     °   Behavior: Behavior normal.     °   Thought Content: Thought content normal.     °   Judgment:  Judgment normal.  ° ° ° ° ° °No results found for this or any previous visit (from the past 48 hour(s)). °No results found. ° °Assessment/Plan: °73-year-old female with obstructing adenocarcinoma of the colon requiring emergent exploratory laparotomy and subtotal colectomy with end ileostomy 6 months ago.  She is doing well from a surgical perspective but now has metastatic disease to the liver and is going to start chemotherapy.  Discussed with patient detail about port placement.  Risk, benefits and possible implications including but not limited to: Bleeding, infection pneumothorax, vascular injuries.  She understands and wished to proceed.  She also understands that we need to control the primary first before entertaining ileostomy takedown.  She is in agreement with this.  Please note that I spent at least 40 minutes in this encounter including coordination of her care, placing orders, counseling the patient and performing appropriate documentation ° ° °Takeria Marquina, MD FACS °General Surgeon  °

## 2021-05-27 NOTE — Progress Notes (Signed)
Outpatient Surgical Follow Up  05/27/2021  Jenna [name] is an 81 y.o. female.   Chief Complaint  Patient presents with   Follow-up    Colectomy with colostomy 11/16/20    HPI: Tiffini is a 81 year old female with known history of adenocarcinoma of the descending colon causing large bowel obstruction requiring emergent laparotomy and subtotal colectomy with end ileostomy by Dr. Celine Ahr on August 2022, she did Perforate.  She had 29 nodes that were negative for metastatic disease.  She has been followed by oncology and recent PET/CT is consistent with metastatic disease to the liver.  There is also lung lesion that might be primary versus metastasis.  She is planning to have chemotherapy on March 7.  I have discussed the case in detail with Dr. Rogue Bussing. She will require port placement. She is doing well otherwise, taking po no N/V no abd pain. Ostomy working Please note I have pers reviewed PE CT. She also had significant increase of CEA.  Past Medical History:  Diagnosis Date   BRCA negative 03/2020   MyRisk neg except MSH3 VUS   Colon cancer [facility])    Family history of breast cancer    IBIS=4.4%/riskscore=2.9%   Gout    Hyperlipidemia    Hypertension    Stroke ([place]) 05/2019   Thyroid disease     Past Surgical History:  Procedure Laterality Date   APPLICATION OF WOUND VAC  11/16/2020   Procedure: APPLICATION OF WOUND VAC;  Surgeon: Fredirick Maudlin, MD;  Location: [facility];  Service: General;;  Serial # [id number]   BREAST BIOPSY Right 1978   neg   CHOLECYSTECTOMY     COLECTOMY WITH COLOSTOMY CREATION/HARTMANN PROCEDURE N/A 11/16/2020   Procedure: SUBTOTAL COLECTOMY WITH ILEOSTOMY CREATION;  Surgeon: Fredirick Maudlin, MD;  Location: [facility];  Service: General;  Laterality: N/A;   LAPAROTOMY N/A 11/16/2020   Procedure: EXPLORATORY LAPAROTOMY;  Surgeon: Fredirick Maudlin, MD;  Location: [facility];  Service: General;  Laterality: N/A;    Family History  Problem Relation Age of  Onset   Breast cancer Maternal Grandmother 66   Breast cancer Cousin 57    Social History:  reports that she has been smoking cigarettes. She has been smoking an average of .25 packs per day. She has never used smokeless tobacco. She reports that she does not drink alcohol and does not use drugs.  Allergies:  Allergies  Allergen Reactions   Nicotine Rash    Medications reviewed.    ROS Full ROS performed and is otherwise negative other than what is stated in HPI   BP 137/74    Pulse 89    Temp 98.2 F (36.8 C) (Oral)    Ht 5' 4" (1.626 m)    Wt 158 lb 12.8 oz (72 kg)    SpO2 96%    BMI 27.26 kg/m   Physical Exam Vitals and nursing note reviewed. Exam conducted with a chaperone present.  Constitutional:      General: She is not in acute distress.    Appearance: Normal appearance.  Cardiovascular:     Rate and Rhythm: Normal rate and regular rhythm.  Pulmonary:     Effort: Pulmonary effort is normal. No respiratory distress.     Breath sounds: Normal breath sounds. No stridor. No rhonchi.  Abdominal:     General: Abdomen is flat. There is no distension.     Palpations: Abdomen is soft. There is no mass.     Tenderness: There is no abdominal tenderness.  There is no guarding or rebound.     Hernia: No hernia is present.  Musculoskeletal:        General: No swelling or tenderness. Normal range of motion.     Cervical back: Normal range of motion and neck supple. No rigidity or tenderness.  Lymphadenopathy:     Cervical: No cervical adenopathy.  Skin:    General: Skin is warm and dry.     Capillary Refill: Capillary refill takes less than 2 seconds.     Coloration: Skin is not jaundiced.  Neurological:     General: No focal deficit present.     Mental Status: She is alert and oriented to person, place, and time.  Psychiatric:        Mood and Affect: Mood normal.        Behavior: Behavior normal.        Thought Content: Thought content normal.        Judgment:  Judgment normal.       No results found for this or any previous visit (from the past 48 hour(s)). No results found.  Assessment/Plan: 81 year old female with obstructing adenocarcinoma of the colon requiring emergent exploratory laparotomy and subtotal colectomy with end ileostomy 6 months ago.  She is doing well from a surgical perspective but now has metastatic disease to the liver and is going to start chemotherapy.  Discussed with patient detail about port placement.  Risk, benefits and possible implications including but not limited to: Bleeding, infection pneumothorax, vascular injuries.  She understands and wished to proceed.  She also understands that we need to control the primary first before entertaining ileostomy takedown.  She is in agreement with this.  Please note that I spent at least 40 minutes in this encounter including coordination of her care, placing orders, counseling the patient and performing appropriate documentation   Caroleen Hamman, MD [facility] Surgeon

## 2021-05-27 NOTE — Telephone Encounter (Signed)
Patient has been advised of Pre-Admission date/time, COVID Testing date and Surgery date.  Surgery Date: 06/03/21 Preadmission Testing Date: 06/01/21 (phone 8a-1p) Covid Testing Date: Not needed    Patient has been made aware to call [phone], between 1-3:00pm the day before surgery, to find out what time to arrive for surgery.

## 2021-05-31 ENCOUNTER — Encounter: Payer: Self-pay | Admitting: Internal Medicine

## 2021-05-31 ENCOUNTER — Other Ambulatory Visit: Payer: Self-pay

## 2021-05-31 ENCOUNTER — Other Ambulatory Visit: Payer: Self-pay | Admitting: Internal Medicine

## 2021-05-31 ENCOUNTER — Inpatient Hospital Stay: Payer: Medicare HMO

## 2021-05-31 DIAGNOSIS — C186 Malignant neoplasm of descending colon: Secondary | ICD-10-CM

## 2021-05-31 MED ORDER — LIDOCAINE-PRILOCAINE 2.5-2.5 % EX CREA: TOPICAL_CREAM | CUTANEOUS | 3 refills | Status: DC

## 2021-05-31 NOTE — Progress Notes (Addendum)
Patient on plan of care prior to pathways. 

## 2021-05-31 NOTE — Progress Notes (Signed)
START ON PATHWAY REGIMEN - Colorectal ° ° °  A cycle is every 14 days: °    Oxaliplatin  °    Leucovorin  °    Fluorouracil  °    Fluorouracil  ° °**Always confirm dose/schedule in your pharmacy ordering system** ° °Patient Characteristics: °Distant Metastases, Nonsurgical Candidate, KRAS/NRAS Mutation Positive/Unknown (BRAF V600 Wild-Type/Unknown), Standard Cytotoxic Therapy, First Line Standard Cytotoxic Therapy, Bevacizumab Ineligible, PS = 0,1 °Tumor Location: Colon °Therapeutic Status: Distant Metastases °Microsatellite/Mismatch Repair Status: MSS/pMMR °BRAF Mutation Status: Awaiting Test Results °KRAS/NRAS Mutation Status: Awaiting Test Results °Standard Cytotoxic Line of Therapy: First Line Standard Cytotoxic Therapy °ECOG Performance Status: 1 °Bevacizumab Eligibility: Ineligible °Intent of Therapy: °Non-Curative / Palliative Intent, Discussed with Patient °

## 2021-05-31 NOTE — Progress Notes (Signed)
[facility] NOTE  Patient Care Team: Perrin Maltese, MD as PCP - General (Internal Medicine) Kate Sable, MD as PCP - Cardiology (Cardiology) Clent Jacks, RN as Oncology Nurse Navigator  CHIEF COMPLAINTS/PURPOSE OF CONSULTATION: COLON CANCER  #  Oncology History Overview Note    # Cololoscoy prior- 5.8 prior  Procedure: Subtotal colectomy   TUMOR  Tumor Site: Descending colon  Histologic Type: Adenocarcinoma  Histologic Grade: G2, moderately differentiated  Tumor Size: Greatest dimension: 4.5 cm  Tumor Extent: Invades through the muscularis propria into pericolorectal  tissue, see comment  Macroscopic Tumor Perforation: Not identified, see comment  Lymph-Vascular Invasion: Not identified  Perineural Invasion: Present  Treatment Effect: No known surgical therapy   MARGINS  Margin Status for Invasive Carcinoma: All margins negative for invasive  carcinoma  Margins examined: Proximal, distal, mesenteric  Margin Status for Non-Invasive Tumor: All margins negative for  high-grade dysplasia/intramucosal carcinoma and low-grade dysplasia   REGIONAL LYMPH NODES  Regional Lymph Nodes: All regional lymph nodes negative for tumor  Number of lymph nodes examined: 29  Tumor Deposits: Not identified   PATHOLOGIC STAGE CLASSIFICATION (pTNM, AJCC 8th Edition):  TNM Descriptors: Not applicable  pT3 at least, see comment  pN0  pM - Not applicable    # JAN 2951-OACZYS tumor marker [Ct DNA]-CT scan-liver lesion lung nodules/FEB 2023- PET scan # MARCH 6th- FOLFOX [bev ineligible-stroke 2021]     # HLP; Acute Stroke [2021s/p TPA;[facility]]; Gout on allopurinol   Cancer of left colon ([place])  11/30/2020 Initial Diagnosis   Cancer of left colon ([place])   12/29/2020 Cancer Staging   Staging form: Colon and Rectum, AJCC 8th Edition - Pathologic: Stage IIA (pT3, pN0, cM0) - Signed by Cammie Sickle, MD on 12/29/2020 Total positive nodes: 0     05/31/2021 Cancer Staging   Staging form: Colon and Rectum, AJCC 8th Edition - Pathologic: Stage IVB (pTX, pNX, cM1b) - Signed by Cammie Sickle, MD on 05/31/2021    06/08/2021 -  Chemotherapy   Patient is on Treatment Plan : COLORECTAL FOLFOX q14d       HISTORY OF PRESENTING ILLNESS: Walking with a cane.  Jenna [name] 81 y.o.  female with a history of stage II colon cancer is here to review the results of the CT scan that was ordered for concerns for recurrent malignancy based on blood work.  Patient continues to have gait instability-given history of stroke.  However no falls.  She walks with a cane.  Otherwise no nausea no vomiting.  No abdominal pain.  No diarrhea.  Review of Systems  Constitutional:  Positive for malaise/fatigue. Negative for chills, diaphoresis, fever and weight loss.  HENT:  Negative for nosebleeds and sore throat.   Eyes:  Negative for double vision.  Respiratory:  Negative for cough, hemoptysis, sputum production, shortness of breath and wheezing.   Cardiovascular:  Negative for chest pain, palpitations, orthopnea and leg swelling.  Gastrointestinal:  Negative for abdominal pain, blood in stool, constipation, diarrhea, heartburn, melena, nausea and vomiting.  Genitourinary:  Negative for dysuria, frequency and urgency.  Musculoskeletal:  Positive for back pain and joint pain.  Skin: Negative.  Negative for itching and rash.  Neurological:  Negative for dizziness, tingling, focal weakness, weakness and headaches.  Endo/Heme/Allergies:  Does not bruise/bleed easily.  Psychiatric/Behavioral:  Negative for depression. The patient is not nervous/anxious and does not have insomnia.     MEDICAL HISTORY:  Past Medical History:  Diagnosis Date  BRCA negative 03/2020   MyRisk neg except MSH3 VUS   Colon cancer ([place])    Family history of breast cancer    IBIS=4.4%/riskscore=2.9%   Gout    Hyperlipidemia    Hypertension    Stroke ([place]) 05/2019    Thyroid disease     SURGICAL HISTORY: Past Surgical History:  Procedure Laterality Date   APPLICATION OF WOUND VAC  11/16/2020   Procedure: APPLICATION OF WOUND VAC;  Surgeon: Fredirick Maudlin, MD;  Location: [facility];  Service: General;;  Serial # [id number]   BREAST BIOPSY Right 1978   neg   CHOLECYSTECTOMY     COLECTOMY WITH COLOSTOMY CREATION/HARTMANN PROCEDURE N/A 11/16/2020   Procedure: SUBTOTAL COLECTOMY WITH ILEOSTOMY CREATION;  Surgeon: Fredirick Maudlin, MD;  Location: [facility];  Service: General;  Laterality: N/A;   LAPAROTOMY N/A 11/16/2020   Procedure: EXPLORATORY LAPAROTOMY;  Surgeon: Fredirick Maudlin, MD;  Location: [facility];  Service: General;  Laterality: N/A;    SOCIAL HISTORY: Social History   Socioeconomic History   Marital status: Married    Spouse name: Not on file   Number of children: Not on file   Years of education: Not on file   Highest education level: Not on file  Occupational History   Not on file  Tobacco Use   Smoking status: Every Day    Packs/day: 0.25    Types: Cigarettes   Smokeless tobacco: Never   Tobacco comments:    smoking 4-5 cigarettes daily.   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 Lives with husband in [place].    Social Determinants of Health   Financial Resource Strain: Not on file  Food Insecurity: Not on file  Transportation Needs: Not on file  Physical Activity: Not on file  Stress: Not on file  Social Connections: Not on file  Intimate Partner Violence: Not on file    FAMILY HISTORY: Family History  Problem Relation Age of Onset   Breast cancer Maternal Grandmother 22   Breast cancer Cousin 40    ALLERGIES:  is allergic to nicotine.  MEDICATIONS:  Current Outpatient  Medications  Medication Sig Dispense Refill   allopurinol (ZYLOPRIM) 100 MG tablet Take 100 mg by mouth every evening.     aspirin EC 81 MG tablet Take 81 mg by mouth every evening. (Patient not taking: Reported on 05/28/2021)     fenofibrate 54 MG tablet Take 54 mg by mouth every evening.     levothyroxine (SYNTHROID) 50 MCG tablet Take 50 mcg by mouth daily.     ondansetron (ZOFRAN) 8 MG tablet One pill every 8 hours as needed for nausea/vomitting. 60 tablet 1   prochlorperazine (COMPAZINE) 10 MG tablet Take 1 tablet (10 mg total) by mouth every 6 (six) hours as needed for nausea or vomiting. 60 tablet 1   rosuvastatin (CRESTOR) 40 MG tablet Take 40 mg by mouth every evening.     No current facility-administered medications for this visit.      Marland Kitchen  PHYSICAL EXAMINATION: ECOG PERFORMANCE STATUS: 1 - Symptomatic but completely ambulatory  Vitals:   05/17/21 1008  BP: (!) 139/92  Pulse: 84  Resp: 16  Temp: (!) 97.2 F (36.2 C)  SpO2: 100%  Filed Weights   05/17/21 1008  Weight: 161 lb (73 kg)     Physical Exam Vitals and nursing note reviewed.  HENT:     Head: Normocephalic and atraumatic.     Mouth/Throat:     Pharynx: Oropharynx is clear.  Eyes:     Extraocular Movements: Extraocular movements intact.     Pupils: Pupils are equal, round, and reactive to light.  Cardiovascular:     Rate and Rhythm: Normal rate and regular rhythm.  Pulmonary:     Comments: Decreased breath sounds bilaterally.  Abdominal:     Palpations: Abdomen is soft.     Comments: Colostomy; woundvac  Musculoskeletal:        General: Normal range of motion.     Cervical back: Normal range of motion.  Skin:    General: Skin is warm.     Comments: Skin rash around the wound.   Neurological:     General: No focal deficit present.     Mental Status: She is alert and oriented to person, place, and time.  Psychiatric:        Behavior: Behavior normal.        Judgment: Judgment normal.     LABORATORY DATA:  I have reviewed the data as listed Lab Results  Component Value Date   WBC 6.5 05/12/2021   HGB 12.7 05/12/2021   HCT 39.2 05/12/2021   MCV 91.6 05/12/2021   PLT 222 05/12/2021   Recent Labs    11/14/20 1153 11/15/20 0528 11/30/20 1301 12/28/20 1551 05/12/21 1437  NA 138   < > 133* 132* 138  K 3.9   < > 5.3* 4.1 4.2  CL 105   < > 99 102 107  CO2 22   < > 23 20* 24  GLUCOSE 135*   < > 108* 95 105*  BUN 17   < > 27* 18 26*  CREATININE 0.98   < > 1.01* 0.94 1.17*  CALCIUM 9.3   < > 9.7 8.9 9.1  GFRNONAA 59*   < > 57* >60 48*  PROT 7.5  --  8.0  --  7.3  ALBUMIN 4.2  --  3.8  --  4.4  AST 16  --  21  --  18  ALT 9  --  17  --  13  ALKPHOS 50  --  47  --  44  BILITOT 0.9  --  0.4  --  0.4   < > = values in this interval not displayed.    RADIOGRAPHIC STUDIES: I have personally reviewed the radiological images as listed and agreed with the findings in the report. CT CHEST ABDOMEN PELVIS W CONTRAST  Result Date: 05/15/2021 CLINICAL DATA:  Colon cancer restaging, status post subtotal colectomy with right lower quadrant end ileostomy, rising tumor marker. EXAM: CT CHEST, ABDOMEN, AND PELVIS WITH CONTRAST TECHNIQUE: Multidetector CT imaging of the chest, abdomen and pelvis was performed following the standard protocol during bolus administration of intravenous contrast. RADIATION DOSE REDUCTION: This exam was performed according to the departmental dose-optimization program which includes automated exposure control, adjustment of the mA and/or kV according to patient size and/or use of iterative reconstruction technique. CONTRAST:  117m OMNIPAQUE IOHEXOL 300 MG/ML SOLN, additional oral enteric contrast COMPARISON:  11/14/2020 FINDINGS: CT CHEST FINDINGS Cardiovascular: Aortic atherosclerosis. Normal heart size. Left coronary artery calcifications. No pericardial effusion. Mediastinum/Nodes: No enlarged mediastinal, hilar, or axillary lymph nodes. Thyroid gland,  trachea, and esophagus demonstrate no significant findings.  Lungs/Pleura: Finely spiculated nodule of the posterior right upper lobe measuring 0.9 x 0.7 cm (series 3, image 52). 0.3 cm nodule of the anterior right upper lobe (series 3, image 79). Additional benign, incidental calcified nodule of the posterior right upper lobe (series 3, image 43). Background of very fine centrilobular pulmonary nodules, concentrated in the lung apices. Mild, diffusely mosaic attenuation of the airspaces. Mild, bandlike scarring of the right middle lobe, lingula, and bilateral lung bases. No pleural effusion or pneumothorax. Musculoskeletal: No chest wall mass or suspicious osseous lesions identified. CT ABDOMEN PELVIS FINDINGS Hepatobiliary: There is a new, faintly rim hypoenhancing subcapsular lesion of the anterior right lobe of the liver, hepatic segment V/VI measuring 1.1 x 0.8 cm (series 2, image 75). Additional tiny low-attenuation lesions of the right lobe of the liver are unchanged, measuring no greater than 0.3 cm, for example in the inferior tip of the right lobe of the liver (series 2, image 86) status post cholecystectomy. No biliary dilatation. Pancreas: Unremarkable. No pancreatic ductal dilatation or surrounding inflammatory changes. Spleen: Normal in size without significant abnormality. Adrenals/Urinary Tract: Adrenal glands are unremarkable. Kidneys are normal, without renal calculi, solid lesion, or hydronephrosis. Bladder is unremarkable. Stomach/Bowel: Stomach is within normal limits. Status post interval subtotal colectomy with right lower quadrant end ileostomy. Severe diverticulosis of the remaining sigmoid colon (series 2, image 106). Vascular/Lymphatic: Aortic atherosclerosis. No enlarged abdominal or pelvic lymph nodes. Reproductive: Status post hysterectomy. Other: No abdominal wall hernia or abnormality. No ascites. Musculoskeletal: No acute osseous findings. Status post left hip total arthroplasty.  IMPRESSION: 1. Status post interval subtotal colectomy with right lower quadrant end ileostomy. 2. There is a new, faintly rim hypoenhancing subcapsular lesion of the anterior right lobe of the liver, consistent with a hepatic metastasis. 3. Finely spiculated nodule of the posterior right upper lobe measuring 0.9 x 0.7 cm, as well as a smaller nodule of the right upper lobe measuring 0.3 cm, highly suspicious for pulmonary metastases. No prior imaging of the chest is available. 4. Background of very fine centrilobular pulmonary nodules, concentrated in the lung apices, most commonly seen in smoking-related respiratory bronchiolitis. 5. Mild, diffusely mosaic attenuation of the airspaces, suggestive of small airways disease. 6. Coronary artery disease. These results will be called to the ordering clinician or representative by the Radiologist Assistant, and communication documented in the PACS or [employer]. Aortic Atherosclerosis (ICD10-I70.0). Electronically Signed   By: Delanna Ahmadi M.D.   On: 05/15/2021 19:54   NM PET Image Initial (PI) Skull Base To Thigh (F-18 FDG)  Result Date: 05/21/2021 CLINICAL DATA:  Initial treatment strategy for history of LEFT colon cancer. EXAM: NUCLEAR MEDICINE PET SKULL BASE TO THIGH TECHNIQUE: 8.76 mCi F-18 FDG was injected intravenously. Full-ring PET imaging was performed from the skull base to thigh after the radiotracer. CT data was obtained and used for attenuation correction and anatomic localization. Fasting blood glucose: 100 mg/dl COMPARISON:  May 14, 2021. FINDINGS: Mediastinal blood pool activity: SUV max 2.74 Liver activity: SUV max NA NECK: RIGHT nasal cavity lesion (image 18/3) this measures 11 mm and shows a maximum SUV of 19. This is found along the posterior margin of the septum to the RIGHT of the septum. LEFT parotid lesion (image 35/3) this measures 13 mm with a maximum SUV of 10.0. No additional areas of increased metabolic activity neck or head,  visualized portions of the head. Incidental CT findings: none CHEST: Nodule in the RIGHT upper lobe displays a maximum SUV of  3.5. Lobular margins, (image 86/3) this previously measured approximally 8 mm to 9 mm and is stable. Granuloma in the RIGHT upper lobe as well more posteriorly. Small nodule in the RIGHT upper lobe (image 103/3) 3 mm. Not associated with increased metabolic activity. No signs of hypermetabolic lymph nodes in the chest. Incidental CT findings: Aortic atherosclerosis. No aneurysmal dilation. Normal heart size without substantial pericardial effusion. Normal caliber of the central pulmonary vessels. Limited assessment of cardiovascular structures given lack of intravenous contrast. ABDOMEN/PELVIS: Small focus of hypoattenuation along the margin of the RIGHT hemi liver difficult to visualize on the current exam, hepatic subsegment VI (image 169/3) this measures 11 mm and is stable with a maximum SUV of 4.9. No additional areas of increased metabolic activity in the abdomen or the pelvis. Incidental CT findings: Post cholecystectomy. No acute pancreatic process or splenic process. Adrenal glands are normal. No hydronephrosis. Urinary bladder under distended limiting assessment. No signs of acute gastrointestinal process with changes of colectomy to the sigmoid level with RIGHT lower quadrant ileostomy and long colonic pouch as before. SKELETON: No focal hypermetabolic activity to suggest skeletal metastasis. Incidental CT findings: LEFT hip arthroplasty with resultant streak artifact. Spinal degenerative changes. IMPRESSION: Findings of LEFT parotid lesion with marked hyper metabolic features. Findings more likely related to primary parotid neoplasm though could represent cervical adenopathy. Consider ultrasound and sampling given the presence of hypermetabolic sinonasal lesion discussed. Small sinonasal lesion with marked hypermetabolic features. This is concerning for small neoplasm in the nasal  cavity, direct visualization is suggested. RIGHT upper lobe pulmonary nodule with mildly increased metabolic activity suspicious for neoplasm, only a single additional nodule is noted and this finding is in the upper lobe. Metastatic disease versus is second primary in the lung is considered. Findings of hepatic metastatic disease. Aortic atherosclerosis. Postoperative changes related to prior colectomy RIGHT-sided ostomy. Aortic Atherosclerosis (ICD10-I70.0). Electronically Signed   By: Zetta Bills M.D.   On: 05/21/2021 16:02    ASSESSMENT & PLAN:   Cancer of left colon [facility]) #Recurrent/stage IV left colon cancer - CT scan- 2/11/2-23- Status post interval subtotal colectomy with right lower quadrant end ileostomy. There is a new, faintly rim hypoenhancing subcapsular lesion of the anterior right lobe of the liver, consistent with a hepatic Metastasis.  Finely spiculated nodule of the posterior right upper lobe measuring 0.9 x 0.7 cm, as well as a smaller nodule of the right upper lobe measuring 0.3 cm, highly suspicious for pulmonary metastases.  Recommend a PET scan for further evaluation.  #[place] discussion with the patient regarding the significant concerns of recurrent malignancy based on imaging/blood work.  If patient noted to have metastatic disease would recommend systemic chemotherapy.  #History of stroke gait instability [2021 s/p TPA]-monitor closely on chemotherapy.  Ineligible for bevacizumab  #Chronic low back pain stable-not related to malignancy; reviewed imaging.  #Incidental findings on Imaging-CT scan February 2023 -background of very fine centrilobular pulmonary nodules, concentrated in the lung apices suggestive of smoking-related respiratory bronchiolitis/airway reactive disease;  Coronary artery disease.: I reviewed/discussed/counseled the patient.   *NGS  # DISPOSITION:  # PET scan ASAP- ok to travel to [place] # chemo education- FOLFOX #  follow up in 3 weeks- MD;  labs- cbc/cmp; CAE; 5FU pump ONLY; off on D-3.  Dr.B  # I reviewed the blood work- with the patient in detail; also reviewed the imaging independently [as summarized above]; and with the patient in detail.      All questions were answered.  The patient knows to call the clinic with any problems, questions or concerns.       Cammie Sickle, MD 05/31/2021 11:40 AM

## 2021-06-01 ENCOUNTER — Encounter
Admission: RE | Admit: 2021-06-01 | Discharge: 2021-06-01 | Disposition: A | Discharge status: Home / Self Care | Payer: Medicare HMO | Source: Ambulatory Visit | Attending: Surgery | Admitting: Surgery

## 2021-06-01 ENCOUNTER — Other Ambulatory Visit: Payer: Self-pay

## 2021-06-01 ENCOUNTER — Telehealth: Payer: Self-pay | Admitting: Urgent Care

## 2021-06-01 VITALS — Ht 64.0 in | Wt 158.0 lb

## 2021-06-01 DIAGNOSIS — Z452 Encounter for adjustment and management of vascular access device: Secondary | ICD-10-CM

## 2021-06-01 DIAGNOSIS — C186 Malignant neoplasm of descending colon: Secondary | ICD-10-CM

## 2021-06-01 MED ORDER — LIDOCAINE-PRILOCAINE 2.5-2.5 % EX CREA
TOPICAL_CREAM | CUTANEOUS | 3 refills | Status: DC
Start: 2021-06-01 — End: 2021-06-03

## 2021-06-01 NOTE — Progress Notes (Signed)
°  Perioperative Services Pre-Admission/Anesthesia Testing    Date: 06/01/21  Name: Jenna [name] MRN:   [id number]  Re: Need for Rx   Clinical Notes:  Patient scheduled to undergo venous access port insertion on 06/03/2021 with Dr. Caroleen Hamman, MD.  During the course of her PAT interview, patient verbalizing concerns that prescription for site analgesia not available for her to pick up.  In review of her medication reconciliation, it appears as if the EMLA cream was sent to her mail order pharmacy rather than patient's local pharmacy.  Patient asking if Rx could be sent to local pharmacy by PAT provider.  I am familiar with her oncology provider Rogue Bussing, MD), thus I feel comfortable prescribing for him in this situation rather than asking him to resend to another pharmacy. In efforts to reduce anxiety related to this needed, and to ensure that patient has medication in place, will send prescription to patient's pharmacy as follows:  Meds ordered this encounter  Medications   lidocaine-prilocaine (EMLA) cream    Sig: Apply small amount to access site 1 hour prior to Port-A-Cath being accessed.  Cover site with occlusive dressing (Saran wrap).    Dispense:  30 g    Refill:  3   Note sent to patient's oncology navigator to make aware that patient need verbalized and taken care of during PAT appointment. Happy to participate in the care needs for this patient as she embarks on her oncology journey. Patient very appreciative of actions taken by PAT to help her resolve this issue.   Honor Loh, MSN, APRN, FNP-C, CEN [facility]  Peri-operative Services Nurse Practitioner Phone: [phone] 06/01/21 12:08 PM  NOTE: This note has been prepared using Dragon dictation software. Despite my best ability to proofread, there is always the potential that unintentional transcriptional errors may still occur from this process.

## 2021-06-01 NOTE — Patient Instructions (Signed)
Your procedure is scheduled on: 06/03/21 Report to [facility]. To find out your arrival time please call [phone] between 1PM - 3PM on 06/02/21.  Remember: Instructions that are not followed completely may result in serious medical risk, up to and including death, or upon the discretion of your surgeon and anesthesiologist your surgery may need to be rescheduled.     _X__ 1. Do not eat food after midnight the night before your procedure.                 No gum chewing or hard candies. You may drink clear liquids up to 2 hours                 before you are scheduled to arrive for your surgery- DO not drink clear                 liquids within 2 hours of the start of your surgery.                 Clear Liquids include:  water, apple juice without pulp, clear carbohydrate                 drink such as Clearfast or Gatorade, Black Coffee or Tea (Do not add                 anything to coffee or tea). Diabetics water only  __X__2.  On the morning of surgery brush your teeth with toothpaste and water, you                 may rinse your mouth with mouthwash if you wish.  Do not swallow any              toothpaste of mouthwash.     _X__ 3.  No Alcohol for 24 hours before or after surgery.   _X__ 4.  Do Not Smoke or use e-cigarettes For 24 Hours Prior to Your Surgery.                 Do not use any chewable tobacco products for at least 6 hours prior to                 surgery.  ____  5.  Bring all medications with you on the day of surgery if instructed.   __X__  6.  Notify your doctor if there is any change in your medical condition      (cold, fever, infections).     Do not wear jewelry, make-up, hairpins, clips or nail polish. Do not wear lotions, powders, or perfumes. No Deodorant Do not shave body hair 48 hours prior to surgery. Men may shave face and neck. Do not bring valuables to the hospital.    [facility] is not responsible  for any belongings or valuables.  Contacts, dentures/partials or body piercings may not be worn into surgery. Bring a case for your contacts, glasses or hearing aids, a denture cup will be supplied. Leave your suitcase in the car. After surgery it may be brought to your room. For patients admitted to the hospital, discharge time is determined by your treatment team.   Patients discharged the day of surgery will not be allowed to drive home.   Please read over the following fact sheets that you were given:     __X__ Take these medicines the morning of surgery with A SIP OF WATER:  1. levothyroxine (SYNTHROID) 50 MCG tablet  2.   3.   4.  5.  6.  ____ Fleet Enema (as directed)   ____ Use CHG Soap/SAGE wipes as directed  ____ Use inhalers on the day of surgery  ____ Stop metformin/Janumet/Farxiga 2 days prior to surgery    ____ Take 1/2 of usual insulin dose the night before surgery. No insulin the morning          of surgery.   ____ Stop Blood Thinners Coumadin/Plavix/Xarelto/Pleta/Pradaxa/Eliquis/Effient/Aspirin  on   Or contact your Surgeon, Cardiologist or Medical Doctor regarding  ability to stop your blood thinners  __X__ Stop Anti-inflammatories 7 days before surgery such as Advil, Ibuprofen, Motrin,  BC or Goodies Powder, Naprosyn, Naproxen, Aleve, Aspirin   You may use Tylenol if needed  __X__ Stop all herbals supplements, fish oil or vitamin E until after surgery.    ____ Bring C-Pap to the hospital.

## 2021-06-02 ENCOUNTER — Encounter: Payer: Medicare HMO | Admitting: Surgery

## 2021-06-02 MED ORDER — ORAL CARE MOUTH RINSE: 15.0000 mL | Freq: Once | OROMUCOSAL | Status: AC

## 2021-06-02 MED ORDER — CHLORHEXIDINE GLUCONATE 0.12 % MT SOLN: 15.0000 mL | Freq: Once | OROMUCOSAL | Status: AC

## 2021-06-02 MED ORDER — CHLORHEXIDINE GLUCONATE CLOTH 2 % EX PADS
6.0000 | MEDICATED_PAD | Freq: Once | CUTANEOUS | Status: AC
  Administered 2021-06-03: 6 via TOPICAL

## 2021-06-02 MED ORDER — CEFAZOLIN SODIUM-DEXTROSE 2-4 GM/100ML-% IV SOLN
2.0000 g | INTRAVENOUS | Status: AC
  Administered 2021-06-03: 2 g via INTRAVENOUS

## 2021-06-02 MED ORDER — ACETAMINOPHEN 500 MG PO TABS: 1000.0000 mg | ORAL_TABLET | ORAL | Status: AC

## 2021-06-02 MED ORDER — FAMOTIDINE 20 MG PO TABS: 20.0000 mg | ORAL_TABLET | Freq: Once | ORAL | Status: AC

## 2021-06-02 MED ORDER — GABAPENTIN 300 MG PO CAPS: 300.0000 mg | ORAL_CAPSULE | ORAL | Status: AC

## 2021-06-02 MED ORDER — LACTATED RINGERS IV SOLN: INTRAVENOUS | Status: DC

## 2021-06-03 ENCOUNTER — Other Ambulatory Visit: Payer: Self-pay | Admitting: *Deleted

## 2021-06-03 ENCOUNTER — Encounter: Payer: Self-pay | Admitting: Surgery

## 2021-06-03 ENCOUNTER — Other Ambulatory Visit: Payer: Self-pay

## 2021-06-03 ENCOUNTER — Ambulatory Visit
Admission: RE | Admit: 2021-06-03 | Discharge: 2021-06-03 | Disposition: A | Discharge status: Home / Self Care | Payer: Medicare HMO | Attending: Surgery | Admitting: Surgery

## 2021-06-03 ENCOUNTER — Ambulatory Visit: Payer: Medicare HMO

## 2021-06-03 ENCOUNTER — Ambulatory Visit: Payer: Medicare HMO | Admitting: Anesthesiology

## 2021-06-03 ENCOUNTER — Encounter
Admission: RE | Disposition: A | Discharge status: Home / Self Care | Payer: Self-pay | Source: Home / Self Care | Attending: Surgery

## 2021-06-03 ENCOUNTER — Ambulatory Visit: Payer: Medicare HMO | Admitting: Urgent Care

## 2021-06-03 ENCOUNTER — Encounter: Payer: Self-pay | Admitting: Internal Medicine

## 2021-06-03 DIAGNOSIS — C186 Malignant neoplasm of descending colon: Secondary | ICD-10-CM

## 2021-06-03 DIAGNOSIS — I2510 Atherosclerotic heart disease of native coronary artery without angina pectoris: Secondary | ICD-10-CM | POA: Insufficient documentation

## 2021-06-03 DIAGNOSIS — C787 Secondary malignant neoplasm of liver and intrahepatic bile duct: Secondary | ICD-10-CM | POA: Diagnosis not present

## 2021-06-03 DIAGNOSIS — Z933 Colostomy status: Secondary | ICD-10-CM | POA: Diagnosis not present

## 2021-06-03 DIAGNOSIS — I129 Hypertensive chronic kidney disease with stage 1 through stage 4 chronic kidney disease, or unspecified chronic kidney disease: Secondary | ICD-10-CM | POA: Diagnosis not present

## 2021-06-03 DIAGNOSIS — J9811 Atelectasis: Secondary | ICD-10-CM | POA: Diagnosis not present

## 2021-06-03 DIAGNOSIS — Z95828 Presence of other vascular implants and grafts: Secondary | ICD-10-CM

## 2021-06-03 DIAGNOSIS — N189 Chronic kidney disease, unspecified: Secondary | ICD-10-CM | POA: Diagnosis not present

## 2021-06-03 DIAGNOSIS — Z8673 Personal history of transient ischemic attack (TIA), and cerebral infarction without residual deficits: Secondary | ICD-10-CM | POA: Diagnosis not present

## 2021-06-03 DIAGNOSIS — Z452 Encounter for adjustment and management of vascular access device: Secondary | ICD-10-CM | POA: Insufficient documentation

## 2021-06-03 DIAGNOSIS — Z9049 Acquired absence of other specified parts of digestive tract: Secondary | ICD-10-CM | POA: Diagnosis not present

## 2021-06-03 DIAGNOSIS — F17210 Nicotine dependence, cigarettes, uncomplicated: Secondary | ICD-10-CM | POA: Diagnosis not present

## 2021-06-03 DIAGNOSIS — R911 Solitary pulmonary nodule: Secondary | ICD-10-CM | POA: Diagnosis not present

## 2021-06-03 DIAGNOSIS — Z85038 Personal history of other malignant neoplasm of large intestine: Secondary | ICD-10-CM | POA: Insufficient documentation

## 2021-06-03 DIAGNOSIS — E785 Hyperlipidemia, unspecified: Secondary | ICD-10-CM | POA: Diagnosis not present

## 2021-06-03 DIAGNOSIS — C189 Malignant neoplasm of colon, unspecified: Secondary | ICD-10-CM | POA: Diagnosis not present

## 2021-06-03 HISTORY — PX: PORTACATH PLACEMENT: SHX2246

## 2021-06-03 SURGERY — INSERTION, TUNNELED CENTRAL VENOUS DEVICE, WITH PORT
Anesthesia: General

## 2021-06-03 MED ORDER — BUPIVACAINE-EPINEPHRINE (PF) 0.25% -1:200000 IJ SOLN
INTRAMUSCULAR | Status: DC | PRN
  Administered 2021-06-03: 15 mL

## 2021-06-03 MED ORDER — CHLORHEXIDINE GLUCONATE 0.12 % MT SOLN
OROMUCOSAL | Status: AC
  Administered 2021-06-03: 15 mL via OROMUCOSAL
  Filled 2021-06-03: qty 15

## 2021-06-03 MED ORDER — HEPARIN SODIUM (PORCINE) 5000 UNIT/ML IJ SOLN
INTRAMUSCULAR | Status: AC
  Filled 2021-06-03: qty 1

## 2021-06-03 MED ORDER — HEPARIN 5000 UNITS IN NS 1000 ML (FLUSH)
INTRAMUSCULAR | Status: DC | PRN
  Administered 2021-06-03: 5 mL via INTRAMUSCULAR

## 2021-06-03 MED ORDER — LIDOCAINE HCL (PF) 1 % IJ SOLN
INTRAMUSCULAR | Status: DC | PRN
  Administered 2021-06-03: 15 mL

## 2021-06-03 MED ORDER — PROPOFOL 500 MG/50ML IV EMUL
INTRAVENOUS | Status: DC | PRN
Start: 2021-06-03 — End: 2021-06-03
  Administered 2021-06-03: 145 ug/kg/min via INTRAVENOUS

## 2021-06-03 MED ORDER — GABAPENTIN 300 MG PO CAPS
ORAL_CAPSULE | ORAL | Status: AC
  Administered 2021-06-03: 300 mg via ORAL
  Filled 2021-06-03: qty 1

## 2021-06-03 MED ORDER — LIDOCAINE-PRILOCAINE 2.5-2.5 % EX CREA: TOPICAL_CREAM | CUTANEOUS | 1 refills | Status: AC

## 2021-06-03 MED ORDER — CEFAZOLIN SODIUM-DEXTROSE 2-4 GM/100ML-% IV SOLN
INTRAVENOUS | Status: AC
  Filled 2021-06-03: qty 100

## 2021-06-03 MED ORDER — FENTANYL CITRATE (PF) 100 MCG/2ML IJ SOLN
INTRAMUSCULAR | Status: AC
  Filled 2021-06-03: qty 2

## 2021-06-03 MED ORDER — PHENYLEPHRINE 40 MCG/ML (10ML) SYRINGE FOR IV PUSH (FOR BLOOD PRESSURE SUPPORT)
PREFILLED_SYRINGE | INTRAVENOUS | Status: DC | PRN
  Administered 2021-06-03: 80 ug via INTRAVENOUS
  Administered 2021-06-03: 120 ug via INTRAVENOUS

## 2021-06-03 MED ORDER — EPHEDRINE SULFATE (PRESSORS) 50 MG/ML IJ SOLN
INTRAMUSCULAR | Status: DC | PRN
  Administered 2021-06-03 (×2): 5 mg via INTRAVENOUS
  Administered 2021-06-03: 2.5 mg via INTRAVENOUS

## 2021-06-03 MED ORDER — ACETAMINOPHEN 500 MG PO TABS
ORAL_TABLET | ORAL | Status: AC
  Administered 2021-06-03: 1000 mg via ORAL
  Filled 2021-06-03: qty 2

## 2021-06-03 MED ORDER — LIDOCAINE HCL (PF) 1 % IJ SOLN
INTRAMUSCULAR | Status: AC
  Filled 2021-06-03: qty 30

## 2021-06-03 MED ORDER — BUPIVACAINE-EPINEPHRINE (PF) 0.25% -1:200000 IJ SOLN
INTRAMUSCULAR | Status: AC
  Filled 2021-06-03: qty 30

## 2021-06-03 MED ORDER — DEXAMETHASONE SODIUM PHOSPHATE 10 MG/ML IJ SOLN
INTRAMUSCULAR | Status: DC | PRN
  Administered 2021-06-03: 10 mg via INTRAVENOUS

## 2021-06-03 MED ORDER — FENTANYL CITRATE (PF) 100 MCG/2ML IJ SOLN
INTRAMUSCULAR | Status: DC | PRN
  Administered 2021-06-03: 50 ug via INTRAVENOUS
  Administered 2021-06-03 (×2): 25 ug via INTRAVENOUS

## 2021-06-03 MED ORDER — PROPOFOL 10 MG/ML IV BOLUS
INTRAVENOUS | Status: DC | PRN
  Administered 2021-06-03: 30 mg via INTRAVENOUS
  Administered 2021-06-03: 10 mg via INTRAVENOUS

## 2021-06-03 MED ORDER — FAMOTIDINE 20 MG PO TABS
ORAL_TABLET | ORAL | Status: AC
  Administered 2021-06-03: 20 mg via ORAL
  Filled 2021-06-03: qty 1

## 2021-06-03 MED ORDER — ONDANSETRON HCL 4 MG/2ML IJ SOLN
INTRAMUSCULAR | Status: DC | PRN
  Administered 2021-06-03: 4 mg via INTRAVENOUS

## 2021-06-03 MED ORDER — DEXAMETHASONE SODIUM PHOSPHATE 10 MG/ML IJ SOLN
INTRAMUSCULAR | Status: DC | PRN
Start: 2021-06-03 — End: 2021-06-03
  Administered 2021-06-03: 4 mg via INTRAVENOUS

## 2021-06-03 SURGICAL SUPPLY — 38 items
ADH SKN CLS APL DERMABOND .7 (GAUZE/BANDAGES/DRESSINGS) ×1
APL PRP STRL LF DISP 70% ISPRP (MISCELLANEOUS) ×1
BAG DECANTER FOR FLEXI CONT (MISCELLANEOUS) ×4 IMPLANT
BLADE SURG SZ11 CARB STEEL (BLADE) ×2 IMPLANT
BOOT SUTURE AID YELLOW STND (SUTURE) ×2 IMPLANT
CHLORAPREP W/TINT 26 (MISCELLANEOUS) ×2 IMPLANT
COVER LIGHT HANDLE STERIS (MISCELLANEOUS) ×4 IMPLANT
DERMABOND ADVANCED (GAUZE/BANDAGES/DRESSINGS) ×1
DERMABOND ADVANCED .7 DNX12 (GAUZE/BANDAGES/DRESSINGS) ×1 IMPLANT
DRAPE 3/4 80X56 (DRAPES) ×2 IMPLANT
DRAPE C-ARM XRAY 36X54 (DRAPES) ×4 IMPLANT
DRAPE INCISE IOBAN 66X45 STRL (DRAPES) ×2 IMPLANT
ELECT CAUTERY BLADE 6.4 (BLADE) ×2 IMPLANT
ELECT REM PT RETURN 9FT ADLT (ELECTROSURGICAL) ×2
ELECTRODE REM PT RTRN 9FT ADLT (ELECTROSURGICAL) ×1 IMPLANT
GAUZE 4X4 16PLY ~~LOC~~+RFID DBL (SPONGE) ×1 IMPLANT
GEL ULTRASOUND 20GR AQUASONIC (MISCELLANEOUS) ×2 IMPLANT
GLOVE SURG ENC MOIS LTX SZ7 (GLOVE) ×6 IMPLANT
GOWN STRL REUS W/ TWL LRG LVL3 (GOWN DISPOSABLE) ×2 IMPLANT
GOWN STRL REUS W/TWL LRG LVL3 (GOWN DISPOSABLE) ×6
IV NS 500ML (IV SOLUTION) ×2
IV NS 500ML BAXH (IV SOLUTION) ×1 IMPLANT
KIT PORT POWER 8FR ISP CVUE (Port) ×2 IMPLANT
MANIFOLD NEPTUNE II (INSTRUMENTS) ×2 IMPLANT
NEEDLE HYPO 22GX1.5 SAFETY (NEEDLE) ×2 IMPLANT
PACK PORT-A-CATH (MISCELLANEOUS) ×2 IMPLANT
SPONGE T-LAP 18X18 ~~LOC~~+RFID (SPONGE) ×2 IMPLANT
SUT MNCRL AB 4-0 PS2 18 (SUTURE) ×2 IMPLANT
SUT PROLENE 2-0 (SUTURE) ×2
SUT PROLENE 2-0 RB1 36X2 ARM (SUTURE) ×1
SUT VIC AB 3-0 SH 27 (SUTURE) ×2
SUT VIC AB 3-0 SH 27X BRD (SUTURE) ×1 IMPLANT
SUTURE PROLEN 2-0 RB1 36X2 ARM (SUTURE) ×1 IMPLANT
SYR 10ML LL (SYRINGE) ×2 IMPLANT
SYR 20ML LL LF (SYRINGE) ×2 IMPLANT
SYR 5ML LL (SYRINGE) ×2 IMPLANT
TOWEL OR 17X26 4PK STRL BLUE (TOWEL DISPOSABLE) ×2 IMPLANT
WATER STERILE IRR 500ML POUR (IV SOLUTION) ×1 IMPLANT

## 2021-06-03 NOTE — Progress Notes (Signed)
Pharmacist Chemotherapy Monitoring - Initial Assessment   ? ?Anticipated start date: 06/07/21  ? ?The following has been reviewed per standard work regarding the patient's treatment regimen: ?The patient's diagnosis, treatment plan and drug doses, and organ/hematologic function ?Lab orders and baseline tests specific to treatment regimen  ?The treatment plan start date, drug sequencing, and pre-medications ?Prior authorization status  ?Patient's documented medication list, including drug-drug interaction screen and prescriptions for anti-emetics and supportive care specific to the treatment regimen ?The drug concentrations, fluid compatibility, administration routes, and timing of the medications to be used ?The patient's access for treatment and lifetime cumulative dose history, if applicable  ?The patient's medication allergies and previous infusion related reactions, if applicable  ? ?Changes made to treatment plan:  ?treatment plan date ? ?Follow up needed:  ?signing treatment plan ? ? ?Adelina Mings, [facility], ?06/03/2021  10:38 AM  ?

## 2021-06-03 NOTE — Transfer of Care (Signed)
Immediate Anesthesia Transfer of Care Note ? ?Patient: Jenna [name] ? ?Procedure(s) Performed: INSERTION PORT-A-CATH ? ?Patient Location: PACU ? ?Anesthesia Type:General ? ?Level of Consciousness: awake, drowsy and patient cooperative ? ?Airway & Oxygen Therapy: Patient Spontanous Breathing and Patient connected to face mask oxygen ? ?Post-op Assessment: Report given to RN and Post -op Vital signs reviewed and stable ? ?Post vital signs: Reviewed and stable ? ?Last Vitals:  ?Vitals Value Taken Time  ?BP 102/57 06/03/21 0931  ?Temp    ?Pulse 103 06/03/21 0934  ?Resp 17 06/03/21 0934  ?SpO2 100 % 06/03/21 0934  ?Vitals shown include unvalidated device data. ? ?Last Pain:  ?Vitals:  ? 06/03/21 0820  ?TempSrc: Temporal  ?PainSc: 0-No pain  ?   ? ?  ? ?Complications: No notable events documented. ?

## 2021-06-03 NOTE — Anesthesia Procedure Notes (Signed)
Procedure Name: General with mask airway ?Date/Time: 06/03/2021 8:54 AM ?Performed by: Kelton Pillar, CRNA ?Pre-anesthesia Checklist: Patient identified, Emergency Drugs available, Suction available and Patient being monitored ?Patient Re-evaluated:Patient Re-evaluated prior to induction ?Oxygen Delivery Method: Simple face mask ?Induction Type: IV induction ?Placement Confirmation: positive ETCO2 and CO2 detector ?Dental Injury: Teeth and Oropharynx as per pre-operative assessment  ? ? ? ? ?

## 2021-06-03 NOTE — Discharge Instructions (Addendum)
Implanted Port Insertion, Care After ?The following information offers guidance on how to care for yourself after your procedure. Your health care provider may also give you more specific instructions. If you have problems or questions, contact your health care provider. ?What can I expect after the procedure? ?After the procedure, it is common to have: ?Discomfort at the port insertion site. ?Bruising on the skin over the port. This should improve over 3-4 days. ?Follow these instructions at home: ?Port care ?After your port is placed, you will get a manufacturer's information card. The card has information about your port. Keep this card with you at all times. ?Take care of the port as told by your health care provider. Ask your health care provider if you or a family member can get training for taking care of the port at home. ?A home health care nurse will be be available to help care for the port. ?Make sure to remember what type of port you have. ?Incision care ?Washing hands with soap and water. ? ?  ?Two stitched incisions. One is normal. The other is red with pus and infected.  ? ?Follow instructions from your health care provider about how to take care of your port insertion site. Make sure you: ?Wash your hands with soap and water for at least 20 seconds before and after you change your bandage (dressing). If soap and water are not available, use hand sanitizer. ?Change your dressing as told by your health care provider. ?Leave stitches (sutures), skin glue, or adhesive strips in place. These skin closures may need to stay in place for 2 weeks or longer. If adhesive strip edges start to loosen and curl up, you may trim the loose edges. Do not remove adhesive strips completely unless your health care provider tells you to do that. ?Check your port insertion site every day for signs of infection. Check for: ?Redness, swelling, or pain. ?Fluid or blood. ?Warmth. ?Pus or a bad smell. ?Activity ?Return to your  normal activities as told by your health care provider. Ask your health care provider what activities are safe for you. ?You may have to avoid lifting. Ask your health care provider how much you can safely lift. ?General instructions ?Take over-the-counter and prescription medicines only as told by your health care provider. ?Do not take baths, swim, or use a hot tub until your health care provider approves. Ask your health care provider if you may take showers. You may only be allowed to take sponge baths. ?If you were given a sedative during the procedure, it can affect you for several hours. Do not drive or operate machinery until your health care provider says that it is safe. ?Wear a medical alert bracelet in case of an emergency. This will tell any health care providers that you have a port. ?Keep all follow-up visits. This is important. ?Contact a health care provider if: ?You cannot flush your port with saline as directed, or you cannot draw blood from the port. ?You have a fever or chills. ?You have redness, swelling, or pain around your port insertion site. ?You have fluid or blood coming from your port insertion site. ?Your port insertion site feels warm to the touch. ?You have pus or a bad smell coming from the port insertion site. ?Get help right away if: ?You have chest pain or shortness of breath. ?You have bleeding from your port that you cannot control. ?These symptoms may be an emergency. Get help right away. Call 911. ?Do not  wait to see if the symptoms will go away. ?Do not drive yourself to the hospital. ?Summary ?Take care of the port as told by your health care provider. Keep the manufacturer's information card with you at all times. ?Change your dressing as told by your health care provider. ?Contact a health care provider if you have a fever or chills or if you have redness, swelling, or pain around your port insertion site. ?Keep all follow-up visits. ?This information is not intended to  replace advice given to you by your health care provider. Make sure you discuss any questions you have with your health care provider. ?Document Revised: 09/22/2020 Document Reviewed: 09/22/2020 ?Elsevier Patient Education ? [place]. ? ?AMBULATORY SURGERY  ?DISCHARGE INSTRUCTIONS ? ? ?The drugs that you were given will stay in your system until tomorrow so for the next 24 hours you should not: ? ?Drive an automobile ?Make any legal decisions ?Drink any alcoholic beverage ? ? ?You may resume regular meals tomorrow.  Today it is better to start with liquids and gradually work up to solid foods. ? ?You may eat anything you prefer, but it is better to start with liquids, then soup and crackers, and gradually work up to solid foods. ? ? ?Please notify your doctor immediately if you have any unusual bleeding, trouble breathing, redness and pain at the surgery site, drainage, fever, or pain not relieved by medication. ? ? ? ?Additional Instructions: ? ? ? ? ? ? ? ?Please contact your physician with any problems or Same Day Surgery at [phone], Monday through Friday 6 am to 4 pm, or [place] at [facility] number at [phone].  ?   ? ?

## 2021-06-03 NOTE — Op Note (Signed)
?  Pre-operative Diagnosis: metastatic colon CA ? ?Post-operative Diagnosis: same ? ? ?Surgeon: Caroleen Hamman, MD FACS ? ?Anesthesia: IV sedation, marcaine .25% w epi and lidocaine 1% ? ?Procedure: right IJ  Port placement with fluoroscopy under U/S guidance ? ?Findings: ?Good position of the tip of the catheter by fluoroscopy ? ?Estimated Blood Loss: Minimal ?        ?Drains: None ?        ?Specimens: None ?      ?Complications: none ?        ? ? ? ?Procedure Details  ?The patient was seen again in the Holding Room. The benefits, complications, treatment options, and expected outcomes were discussed with the patient. The risks of bleeding, infection, recurrence of symptoms, failure to resolve symptoms,  thrombosis nonfunction breakage pneumothorax hemopneumothorax any of which could require chest tube or further surgery were reviewed with the patient.   The patient was taken to Operating Room, identified as Jenna [name] and the procedure verified.  A Time Out was held and the above information confirmed. ? ?Prior to the induction of general anesthesia, antibiotic prophylaxis was administered. VTE prophylaxis was in place. Appropriate anesthesia was then administered and tolerated well. The chest was prepped with Chloraprep and draped in the sterile fashion. The patient was positioned in the supine position. Then the patient was placed in Trendelenburg position. ? ?Patient was prepped and draped in sterile fashion and in a Trendelenburg position local anesthetic was infiltrated into the skin and subcutaneous tissues in the neck and anterior chest wall. The large bore needle was placed into the internal jugular vein under U/S guidance without difficulty and then the Seldinger wire was advanced. Fluoroscopy was utilized to confirm that the Seldinger wire was in the superior vena cava. ? ?An incision was made and a port pocket developed with blunt and electrocautery dissection. The introducer dilator was placed over  the Seldinger wire the wire was removed. The previously flushed catheter was placed into the introducer dilator and the peel-away sheath was removed. The catheter length was confirmed and trimmed utilizing fluoroscopy for proper positioning. The catheter was then attached to the previously flushed port. The port was placed into the pocket. The port was held in with 2-0 Prolenes and flushed for function and heparin locked. ? ?The wound was closed with interrupted 3-0 Vicryl followed by 4-0 subcuticular Monocryl sutures. Dermabond used to coat the skin ? ?Patient was taken to the recovery room in stable condition where a postoperative chest film has been ordered. ? ?  ?

## 2021-06-03 NOTE — Interval H&P Note (Signed)
History and Physical Interval Note: ? ?06/03/2021 ?8:17 AM ? ?Jenna [name]  has presented today for surgery, with the diagnosis of colan cancer.  The various methods of treatment have been discussed with the patient and family. After consideration of risks, benefits and other options for treatment, the patient has consented to  Procedure(s) with comments: ?INSERTION PORT-A-CATH (N/A) - Provider requesting 1 hour / 60 minutes for procedure. as a surgical intervention.  The patient's history has been reviewed, patient examined, no change in status, stable for surgery.  I have reviewed the patient's chart and labs.  Questions were answered to the patient's satisfaction.   ? ? ?[place] ? ? ?

## 2021-06-03 NOTE — Anesthesia Preprocedure Evaluation (Addendum)
Anesthesia Evaluation  ?Patient identified by MRN, date of birth, ID band ?Patient awake ? ? ? ?Reviewed: ?Allergy & Precautions, NPO status , Patient's Chart, lab work & pertinent test results ? ?History of Anesthesia Complications ?Negative for: history of anesthetic complications ? ?Airway ?Mallampati: II ? ?TM Distance: >3 FB ?Neck ROM: Full ? ? ? Dental ? ?(+) Missing,  ?  ?Pulmonary ?neg sleep apnea, neg COPD, Current Smoker and Patient abstained from smoking.,  ?Pulmonary nodule on CT scan 05/2021 ?  ?breath sounds clear to auscultation- rhonchi ?(-) wheezing ? ? ? ? ? Cardiovascular ?hypertension, Pt. on medications ?+ CAD (on CT scan 05/2021)  ?(-) Past MI, (-) Cardiac Stents and (-) CABG  ?Rhythm:Regular Rate:Normal ?- Systolic murmurs and - Diastolic murmurs ? ?  ?Neuro/Psych ?neg Seizures CVA, No Residual Symptoms negative psych ROS  ? GI/Hepatic ?Neg liver ROS, Likely bowel obstruction ?  ?Endo/Other  ?neg diabetesHypothyroidism  ? Renal/GU ?CRFRenal disease  ? ?  ?Musculoskeletal ?negative musculoskeletal ROS ?(+)  ? Abdominal ?(+) - obese,   ?Peds ? Hematology ?negative hematology ROS ?(+)   ?Anesthesia Other Findings ?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 She has mets to the liver and will need chemotherapy. ? Reproductive/Obstetrics ? ?  ? ? ? ? ? ? ? ? ? ? ? ? ? ?  ?  ? ? ? ? ? ? ? ?Anesthesia Physical ? ?Anesthesia Plan ? ?ASA: 3 ? ?Anesthesia Plan: General  ? ?Post-op Pain Management: Gabapentin PO (pre-op)*, Tylenol PO (pre-op)*, Minimal or no pain anticipated and Regional block*  ? ?Induction: Intravenous ? ?PONV Risk Score and Plan: 1 and Propofol infusion and TIVA ? ?Airway Management Planned: Natural Airway and Simple Face Mask ? ?Additional Equipment:  ? ?Intra-op Plan:  ? ?Post-operative Plan:  ? ?Informed Consent: I have reviewed the patients History and Physical, chart, labs and discussed the procedure  including the risks, benefits and alternatives for the proposed anesthesia with the patient or authorized representative who has indicated his/her understanding and acceptance.  ? ? ? ?Dental advisory given ? ?Plan Discussed with: CRNA and Anesthesiologist ? ?Anesthesia Plan Comments:   ? ? ? ? ? ?Anesthesia Quick Evaluation ? ?

## 2021-06-04 ENCOUNTER — Encounter: Payer: Self-pay | Admitting: Surgery

## 2021-06-04 NOTE — Anesthesia Postprocedure Evaluation (Signed)
Anesthesia Post Note ? ?Patient: Jenna [name] ? ?Procedure(s) Performed: INSERTION PORT-A-CATH ? ?Patient location during evaluation: PACU ?Anesthesia Type: General ?Level of consciousness: awake and alert ?Pain management: pain level controlled ?Vital Signs Assessment: post-procedure vital signs reviewed and stable ?Respiratory status: spontaneous breathing, nonlabored ventilation and respiratory function stable ?Cardiovascular status: blood pressure returned to baseline and stable ?Postop Assessment: no apparent nausea or vomiting ?Anesthetic complications: no ? ? ?No notable events documented. ? ? ?Last Vitals:  ?Vitals:  ? 06/03/21 0951 06/03/21 0958  ?BP:  128/75  ?Pulse: 95 100  ?Resp: 17 14  ?Temp: 36.7 ?C (!) 36.3 ?C  ?SpO2: 98% 96%  ?  ?Last Pain:  ?Vitals:  ? 06/03/21 0958  ?TempSrc: Temporal  ?PainSc: 0-No pain  ? ? ?  ?  ?  ?  ?  ?  ? ?[place] ? ? ? ? ?

## 2021-06-07 ENCOUNTER — Inpatient Hospital Stay: Payer: Medicare HMO | Admitting: Internal Medicine

## 2021-06-07 ENCOUNTER — Inpatient Hospital Stay: Payer: Medicare HMO

## 2021-06-07 ENCOUNTER — Other Ambulatory Visit: Payer: Self-pay

## 2021-06-07 ENCOUNTER — Encounter: Payer: Self-pay | Admitting: Internal Medicine

## 2021-06-07 ENCOUNTER — Inpatient Hospital Stay: Payer: Medicare HMO | Attending: Internal Medicine

## 2021-06-07 DIAGNOSIS — Z8673 Personal history of transient ischemic attack (TIA), and cerebral infarction without residual deficits: Secondary | ICD-10-CM | POA: Diagnosis not present

## 2021-06-07 DIAGNOSIS — Z5111 Encounter for antineoplastic chemotherapy: Secondary | ICD-10-CM | POA: Insufficient documentation

## 2021-06-07 DIAGNOSIS — C186 Malignant neoplasm of descending colon: Secondary | ICD-10-CM

## 2021-06-07 DIAGNOSIS — M5459 Other low back pain: Secondary | ICD-10-CM | POA: Insufficient documentation

## 2021-06-07 DIAGNOSIS — I2510 Atherosclerotic heart disease of native coronary artery without angina pectoris: Secondary | ICD-10-CM | POA: Insufficient documentation

## 2021-06-07 DIAGNOSIS — C787 Secondary malignant neoplasm of liver and intrahepatic bile duct: Secondary | ICD-10-CM | POA: Diagnosis not present

## 2021-06-07 DIAGNOSIS — C7801 Secondary malignant neoplasm of right lung: Secondary | ICD-10-CM | POA: Diagnosis not present

## 2021-06-07 LAB — COMPREHENSIVE METABOLIC PANEL
ALT: 10 U/L (ref 0–44)
AST: 22 U/L (ref 15–41)
Albumin: 4.1 g/dL (ref 3.5–5.0)
Alkaline Phosphatase: 41 U/L (ref 38–126)
Anion gap: 10 (ref 5–15)
BUN: 23 mg/dL (ref 8–23)
CO2: 19 mmol/L — ABNORMAL LOW (ref 22–32)
Calcium: 9 mg/dL (ref 8.9–10.3)
Chloride: 106 mmol/L (ref 98–111)
Creatinine, Ser: 1.03 mg/dL — ABNORMAL HIGH (ref 0.44–1.00)
GFR, Estimated: 56 mL/min — ABNORMAL LOW (ref >60)
Glucose, Bld: 105 mg/dL — ABNORMAL HIGH (ref 70–99)
Potassium: 3.8 mmol/L (ref 3.5–5.1)
Sodium: 135 mmol/L (ref 135–145)
Total Bilirubin: 0.3 mg/dL (ref 0.3–1.2)
Total Protein: 7.4 g/dL (ref 6.5–8.1)

## 2021-06-07 LAB — CBC WITH DIFFERENTIAL/PLATELET
Abs Immature Granulocytes: 0.01 10*3/uL (ref 0.00–0.07)
Basophils Absolute: 0.1 10*3/uL (ref 0.0–0.1)
Basophils Relative: 1 %
Eosinophils Absolute: 0.3 10*3/uL (ref 0.0–0.5)
Eosinophils Relative: 4 %
HCT: 39.5 % (ref 36.0–46.0)
Hemoglobin: 12.8 g/dL (ref 12.0–15.0)
Immature Granulocytes: 0 %
Lymphocytes Relative: 34 %
Lymphs Abs: 2.8 10*3/uL (ref 0.7–4.0)
MCH: 29.7 pg (ref 26.0–34.0)
MCHC: 32.4 g/dL (ref 30.0–36.0)
MCV: 91.6 fL (ref 80.0–100.0)
Monocytes Absolute: 0.5 10*3/uL (ref 0.1–1.0)
Monocytes Relative: 6 %
Neutro Abs: 4.6 10*3/uL (ref 1.7–7.7)
Neutrophils Relative %: 55 %
Platelets: 202 10*3/uL (ref 150–400)
RBC: 4.31 MIL/uL (ref 3.87–5.11)
RDW: 14.5 % (ref 11.5–15.5)
WBC: 8.2 10*3/uL (ref 4.0–10.5)
nRBC: 0 % (ref 0.0–0.2)

## 2021-06-07 MED ORDER — DEXTROSE 5 % IV SOLN
Freq: Once | INTRAVENOUS | Status: AC
  Filled 2021-06-07: qty 250

## 2021-06-07 MED ORDER — SODIUM CHLORIDE 0.9 % IV SOLN
2400.0000 mg/m2 | INTRAVENOUS | Status: DC
  Administered 2021-06-07: 4300 mg via INTRAVENOUS
  Filled 2021-06-07: qty 86

## 2021-06-07 MED ORDER — OXALIPLATIN CHEMO INJECTION 100 MG/20ML
60.0000 mg/m2 | Freq: Once | INTRAVENOUS | Status: AC
  Administered 2021-06-07: 110 mg via INTRAVENOUS
  Filled 2021-06-07: qty 20

## 2021-06-07 MED ORDER — LEUCOVORIN CALCIUM INJECTION 350 MG
700.0000 mg | Freq: Once | INTRAVENOUS | Status: AC
  Administered 2021-06-07: 700 mg via INTRAVENOUS
  Filled 2021-06-07: qty 35

## 2021-06-07 MED ORDER — SODIUM CHLORIDE 0.9 % IV SOLN
10.0000 mg | Freq: Once | INTRAVENOUS | Status: AC
  Administered 2021-06-07: 10 mg via INTRAVENOUS
  Filled 2021-06-07: qty 10

## 2021-06-07 MED ORDER — PALONOSETRON HCL INJECTION 0.25 MG/5ML
0.2500 mg | Freq: Once | INTRAVENOUS | Status: AC
  Administered 2021-06-07: 0.25 mg via INTRAVENOUS
  Filled 2021-06-07: qty 5

## 2021-06-07 NOTE — Assessment & Plan Note (Addendum)
#  Recurrent/stage IV left colon cancer [rising tumor marker/CT DNA] CT scan- 2/11/2-23-right hepatic lobe liver lesion; spiculated nodule of the posterior right upper lobe measuring 0.9 x 0.7 cm, as well as a smaller nodule of the right upper lobe measuring 0.3 cm, highly suspicious for pulmonary metastases. FEB 2023- PET scan -confirms above findings. ? ?#I had a long discussion with the patient regarding the incurable nature of the disease given the liver metastases/lung metastasis.  I would recommend palliative chemotherapy with FOLFOX. ? ?# proceed with FOLFOX; [dose reduced- 60 mg/m2]CBC CMP are reviewed; adequate today; no contraindications to chemotherapy. Proceed with treatment today.  ? ?#History of stroke gait instability [2021 s/p TPA-  Ineligible for bevacizumab]-monitor closely on chemotherapy. OK to start asprin [after port].  ? ?#Chronic low back pain-stable unrelated to malignancy.    ? ?#Incidental findings on Imaging-PET scan February 2023: LEFT parotid lesion with marked hyper metabolic; and also hypermetabolic sinonasal lesion [patient s/p previous evaluation work-up Richardson Landry; I discussed with Dr. Richardson Landry appointment in April; coronary arteriosclerosis.  ? ?# I reviewed the blood work- with the patient in detail; also reviewed the imaging independently [as summarized above]; and with the patient in detail.   ? ?# I offered to call the patient's family to give an update on clinical status; patient declines.  She states that she will have her husband come with her at next visit.  She does not want to bother her children. ? ?*NGS  ?# DISPOSITION:  ?# chemo today. ?# cancel the 3/27 appt ?#  follow up in 2 weeks- MD; labs- cbc/cmp; CEA; FOLFOX chemo; off on D-3.  Dr.B ? ?# I reviewed the blood work- with the patient in detail; also reviewed the imaging independently [as summarized above]; and with the patient in detail.  ? ?# 40 minutes face-to-face with the patient discussing the above plan of care;  more than 50% of time spent on prognosis/ natural history; counseling and coordination. ? ? ?

## 2021-06-07 NOTE — Patient Instructions (Signed)
[facility] [place]  Discharge Instructions: Thank you for choosing [facility] to provide your oncology and hematology care.  If you have a lab appointment with the [facility], please go directly to the [facility] and check in at the registration area.  Wear comfortable clothing and clothing appropriate for easy access to any Portacath or PICC line.   We strive to give you quality time with your provider. You may need to reschedule your appointment if you arrive late (15 or more minutes).  Arriving late affects you and other patients whose appointments are after yours.  Also, if you miss three or more appointments without notifying the office, you may be dismissed from the clinic at the providers discretion.      For prescription refill requests, have your pharmacy contact our office and allow 72 hours for refills to be completed.    Today you received the following chemotherapy and/or immunotherapy agents Oxaliplatin, Leucovorin and Adrucil       To help prevent nausea and vomiting after your treatment, we encourage you to take your nausea medication as directed.  BELOW ARE SYMPTOMS THAT SHOULD BE REPORTED IMMEDIATELY: *FEVER GREATER THAN 100.4 F (38 C) OR HIGHER *CHILLS OR SWEATING *NAUSEA AND VOMITING THAT IS NOT CONTROLLED WITH YOUR NAUSEA MEDICATION *UNUSUAL SHORTNESS OF BREATH *UNUSUAL BRUISING OR BLEEDING *URINARY PROBLEMS (pain or burning when urinating, or frequent urination) *BOWEL PROBLEMS (unusual diarrhea, constipation, pain near the anus) TENDERNESS IN MOUTH AND THROAT WITH OR WITHOUT PRESENCE OF ULCERS (sore throat, sores in mouth, or a toothache) UNUSUAL RASH, SWELLING OR PAIN  UNUSUAL VAGINAL DISCHARGE OR ITCHING   Items with * indicate a potential emergency and should be followed up as soon as possible or go to the Emergency Department if any problems should occur.  Please show the CHEMOTHERAPY ALERT CARD or IMMUNOTHERAPY  ALERT CARD at check-in to the Emergency Department and triage nurse.  Should you have questions after your visit or need to cancel or reschedule your appointment, please contact [facility] [place] [facility] [place]  [phone] and follow the prompts.  Office hours are 8:00 a.m. to 4:30 p.m. Monday - Friday. Please note that voicemails left after 4:00 p.m. may not be returned until the following business day.  We are closed weekends and major holidays. You have access to a nurse at all times for urgent questions. Please call the main number to the clinic [phone] and follow the prompts.  For any non-urgent questions, you may also contact your provider using MyChart. We now offer e-Visits for anyone 21 and older to request care online for non-urgent symptoms. For details visit [email or web address].   Also download the MyChart app! Go to the app store, search "MyChart", open the app, select [facility], and log in with your MyChart username and password.  Due to Covid, a mask is required upon entering the hospital/clinic. If you do not have a mask, one will be given to you upon arrival. For doctor visits, patients may have 1 support person aged 12 or older with them. For treatment visits, patients cannot have anyone with them due to current Covid guidelines and our immunocompromised population.    Oxaliplatin Injection What is this medication? OXALIPLATIN (ox AL i PLA tin) is a chemotherapy drug. It targets fast dividing cells, like cancer cells, and causes these cells to die. This medicine is used to treat cancers of the colon and rectum, and many other cancers. This medicine may be  used for other purposes; ask your health care provider or pharmacist if you have questions. COMMON BRAND NAME(S): Eloxatin What should I tell my care team before I take this medication? They need to know if you have any of these conditions: heart disease history of irregular heartbeat liver disease low  blood counts, like white cells, platelets, or red blood cells lung or breathing disease, like asthma take medicines that treat or prevent blood clots tingling of the fingers or toes, or other nerve disorder an unusual or allergic reaction to oxaliplatin, other chemotherapy, other medicines, foods, dyes, or preservatives pregnant or trying to get pregnant breast-feeding How should I use this medication? This drug is given as an infusion into a vein. It is administered in a hospital or clinic by a specially trained health care professional. Talk to your pediatrician regarding the use of this medicine in children. Special care may be needed. Overdosage: If you think you have taken too much of this medicine contact a poison control center or emergency room at once. NOTE: This medicine is only for you. Do not share this medicine with others. What if I miss a dose? It is important not to miss a dose. Call your doctor or health care professional if you are unable to keep an appointment. What may interact with this medication? Do not take this medicine with any of the following medications: cisapride dronedarone pimozide thioridazine This medicine may also interact with the following medications: aspirin and aspirin-like medicines certain medicines that treat or prevent blood clots like warfarin, apixaban, dabigatran, and rivaroxaban cisplatin cyclosporine diuretics medicines for infection like acyclovir, adefovir, amphotericin B, bacitracin, cidofovir, foscarnet, ganciclovir, gentamicin, pentamidine, vancomycin NSAIDs, medicines for pain and inflammation, like ibuprofen or naproxen other medicines that prolong the QT interval (an abnormal heart rhythm) pamidronate zoledronic acid This list may not describe all possible interactions. Give your health care provider a list of all the medicines, herbs, non-prescription drugs, or dietary supplements you use. Also tell them if you smoke, drink  alcohol, or use illegal drugs. Some items may interact with your medicine. What should I watch for while using this medication? Your condition will be monitored carefully while you are receiving this medicine. You may need blood work done while you are taking this medicine. This medicine may make you feel generally unwell. This is not uncommon as chemotherapy can affect healthy cells as well as cancer cells. Report any side effects. Continue your course of treatment even though you feel ill unless your healthcare professional tells you to stop. This medicine can make you more sensitive to cold. Do not drink cold drinks or use ice. Cover exposed skin before coming in contact with cold temperatures or cold objects. When out in cold weather wear warm clothing and cover your mouth and nose to warm the air that goes into your lungs. Tell your doctor if you get sensitive to the cold. Do not become pregnant while taking this medicine or for 9 months after stopping it. Women should inform their health care professional if they wish to become pregnant or think they might be pregnant. Men should not father a child while taking this medicine and for 6 months after stopping it. There is potential for serious side effects to an unborn child. Talk to your health care professional for more information. Do not breast-feed a child while taking this medicine or for 3 months after stopping it. This medicine has caused ovarian failure in some women. This medicine may make it  more difficult to get pregnant. Talk to your health care professional if you are concerned about your fertility. This medicine has caused decreased sperm counts in some men. This may make it more difficult to father a child. Talk to your health care professional if you are concerned about your fertility. This medicine may increase your risk of getting an infection. Call your health care professional for advice if you get a fever, chills, or sore throat,  or other symptoms of a cold or flu. Do not treat yourself. Try to avoid being around people who are sick. Avoid taking medicines that contain aspirin, acetaminophen, ibuprofen, naproxen, or ketoprofen unless instructed by your health care professional. These medicines may hide a fever. Be careful brushing or flossing your teeth or using a toothpick because you may get an infection or bleed more easily. If you have any dental work done, tell your dentist you are receiving this medicine. What side effects may I notice from receiving this medication? Side effects that you should report to your doctor or health care professional as soon as possible: allergic reactions like skin rash, itching or hives, swelling of the face, lips, or tongue breathing problems cough low blood counts - this medicine may decrease the number of white blood cells, red blood cells, and platelets. You may be at increased risk for infections and bleeding nausea, vomiting pain, redness, or irritation at site where injected pain, tingling, numbness in the hands or feet signs and symptoms of bleeding such as bloody or black, tarry stools; red or dark brown urine; spitting up blood or brown material that looks like coffee grounds; red spots on the skin; unusual bruising or bleeding from the eyes, gums, or nose signs and symptoms of a dangerous change in heartbeat or heart rhythm like chest pain; dizziness; fast, irregular heartbeat; palpitations; feeling faint or lightheaded; falls signs and symptoms of infection like fever; chills; cough; sore throat; pain or trouble passing urine signs and symptoms of liver injury like dark yellow or brown urine; general ill feeling or flu-like symptoms; light-colored stools; loss of appetite; nausea; right upper belly pain; unusually weak or tired; yellowing of the eyes or skin signs and symptoms of low red blood cells or anemia such as unusually weak or tired; feeling faint or lightheaded;  falls signs and symptoms of muscle injury like dark urine; trouble passing urine or change in the amount of urine; unusually weak or tired; muscle pain; back pain Side effects that usually do not require medical attention (report to your doctor or health care professional if they continue or are bothersome): changes in taste diarrhea gas hair loss loss of appetite mouth sores This list may not describe all possible side effects. Call your doctor for medical advice about side effects. You may report side effects to FDA at 1-800-FDA-1088. Where should I keep my medication? This drug is given in a hospital or clinic and will not be stored at home. NOTE: This sheet is a summary. It may not cover all possible information. If you have questions about this medicine, talk to your doctor, pharmacist, or health care provider.  2022 Elsevier/Gold Standard (2020-12-08 00:00:00)   Leucovorin injection What is this medication? LEUCOVORIN (loo koe VOR in) is used to prevent or treat the harmful effects of some medicines. This medicine is used to treat anemia caused by a low amount of folic acid in the body. It is also used with 5-fluorouracil (5-FU) to treat colon cancer. This medicine may be used  for other purposes; ask your health care provider or pharmacist if you have questions. What should I tell my care team before I take this medication? They need to know if you have any of these conditions: anemia from low levels of vitamin B-12 in the blood an unusual or allergic reaction to leucovorin, folic acid, other medicines, foods, dyes, or preservatives pregnant or trying to get pregnant breast-feeding How should I use this medication? This medicine is for injection into a muscle or into a vein. It is given by a health care professional in a hospital or clinic setting. Talk to your pediatrician regarding the use of this medicine in children. Special care may be needed. Overdosage: If you think you  have taken too much of this medicine contact a poison control center or emergency room at once. NOTE: This medicine is only for you. Do not share this medicine with others. What if I miss a dose? This does not apply. What may interact with this medication? capecitabine fluorouracil phenobarbital phenytoin primidone trimethoprim-sulfamethoxazole This list may not describe all possible interactions. Give your health care provider a list of all the medicines, herbs, non-prescription drugs, or dietary supplements you use. Also tell them if you smoke, drink alcohol, or use illegal drugs. Some items may interact with your medicine. What should I watch for while using this medication? Your condition will be monitored carefully while you are receiving this medicine. This medicine may increase the side effects of 5-fluorouracil, 5-FU. Tell your doctor or health care professional if you have diarrhea or mouth sores that do not get better or that get worse. What side effects may I notice from receiving this medication? Side effects that you should report to your doctor or health care professional as soon as possible: allergic reactions like skin rash, itching or hives, swelling of the face, lips, or tongue breathing problems fever, infection mouth sores unusual bleeding or bruising unusually weak or tired Side effects that usually do not require medical attention (report to your doctor or health care professional if they continue or are bothersome): constipation or diarrhea loss of appetite nausea, vomiting This list may not describe all possible side effects. Call your doctor for medical advice about side effects. You may report side effects to FDA at 1-800-FDA-1088. Where should I keep my medication? This drug is given in a hospital or clinic and will not be stored at home. NOTE: This sheet is a summary. It may not cover all possible information. If you have questions about this medicine, talk  to your doctor, pharmacist, or health care provider.  2022 Elsevier/Gold Standard (2007-09-27 00:00:00)    Fluorouracil, 5-FU injection What is this medication? FLUOROURACIL, 5-FU (flure oh YOOR a sil) is a chemotherapy drug. It slows the growth of cancer cells. This medicine is used to treat many types of cancer like breast cancer, colon or rectal cancer, pancreatic cancer, and stomach cancer. This medicine may be used for other purposes; ask your health care provider or pharmacist if you have questions. COMMON BRAND NAME(S): Adrucil What should I tell my care team before I take this medication? They need to know if you have any of these conditions: blood disorders dihydropyrimidine dehydrogenase (DPD) deficiency infection (especially a virus infection such as chickenpox, cold sores, or herpes) kidney disease liver disease malnourished, poor nutrition recent or ongoing radiation therapy an unusual or allergic reaction to fluorouracil, other chemotherapy, other medicines, foods, dyes, or preservatives pregnant or trying to get pregnant breast-feeding How should I  use this medication? This drug is given as an infusion or injection into a vein. It is administered in a hospital or clinic by a specially trained health care professional. Talk to your pediatrician regarding the use of this medicine in children. Special care may be needed. Overdosage: If you think you have taken too much of this medicine contact a poison control center or emergency room at once. NOTE: This medicine is only for you. Do not share this medicine with others. What if I miss a dose? It is important not to miss your dose. Call your doctor or health care professional if you are unable to keep an appointment. What may interact with this medication? Do not take this medicine with any of the following medications: live virus vaccines This medicine may also interact with the following medications: medicines that treat  or prevent blood clots like warfarin, enoxaparin, and dalteparin This list may not describe all possible interactions. Give your health care provider a list of all the medicines, herbs, non-prescription drugs, or dietary supplements you use. Also tell them if you smoke, drink alcohol, or use illegal drugs. Some items may interact with your medicine. What should I watch for while using this medication? Visit your doctor for checks on your progress. This drug may make you feel generally unwell. This is not uncommon, as chemotherapy can affect healthy cells as well as cancer cells. Report any side effects. Continue your course of treatment even though you feel ill unless your doctor tells you to stop. In some cases, you may be given additional medicines to help with side effects. Follow all directions for their use. Call your doctor or health care professional for advice if you get a fever, chills or sore throat, or other symptoms of a cold or flu. Do not treat yourself. This drug decreases your body's ability to fight infections. Try to avoid being around people who are sick. This medicine may increase your risk to bruise or bleed. Call your doctor or health care professional if you notice any unusual bleeding. Be careful brushing and flossing your teeth or using a toothpick because you may get an infection or bleed more easily. If you have any dental work done, tell your dentist you are receiving this medicine. Avoid taking products that contain aspirin, acetaminophen, ibuprofen, naproxen, or ketoprofen unless instructed by your doctor. These medicines may hide a fever. Do not become pregnant while taking this medicine. Women should inform their doctor if they wish to become pregnant or think they might be pregnant. There is a potential for serious side effects to an unborn child. Talk to your health care professional or pharmacist for more information. Do not breast-feed an infant while taking this  medicine. Men should inform their doctor if they wish to father a child. This medicine may lower sperm counts. Do not treat diarrhea with over the counter products. Contact your doctor if you have diarrhea that lasts more than 2 days or if it is severe and watery. This medicine can make you more sensitive to the sun. Keep out of the sun. If you cannot avoid being in the sun, wear protective clothing and use sunscreen. Do not use sun lamps or tanning beds/booths. What side effects may I notice from receiving this medication? Side effects that you should report to your doctor or health care professional as soon as possible: allergic reactions like skin rash, itching or hives, swelling of the face, lips, or tongue low blood counts - this medicine  may decrease the number of white blood cells, red blood cells and platelets. You may be at increased risk for infections and bleeding. signs of infection - fever or chills, cough, sore throat, pain or difficulty passing urine signs of decreased platelets or bleeding - bruising, pinpoint red spots on the skin, black, tarry stools, blood in the urine signs of decreased red blood cells - unusually weak or tired, fainting spells, lightheadedness breathing problems changes in vision chest pain mouth sores nausea and vomiting pain, swelling, redness at site where injected pain, tingling, numbness in the hands or feet redness, swelling, or sores on hands or feet stomach pain unusual bleeding Side effects that usually do not require medical attention (report to your doctor or health care professional if they continue or are bothersome): changes in finger or toe nails diarrhea dry or itchy skin hair loss headache loss of appetite sensitivity of eyes to the light stomach upset unusually teary eyes This list may not describe all possible side effects. Call your doctor for medical advice about side effects. You may report side effects to FDA at  1-800-FDA-1088. Where should I keep my medication? This drug is given in a hospital or clinic and will not be stored at home. NOTE: This sheet is a summary. It may not cover all possible information. If you have questions about this medicine, talk to your doctor, pharmacist, or health care provider.  2022 [employer]vier/Gold Standard (2020-12-08 00:00:00)

## 2021-06-07 NOTE — Progress Notes (Signed)
[facility] °CONSULT NOTE ° °Patient Care Team: °Khan, Neelam S, MD as PCP - General (Internal Medicine) °Agbor-Etang, Brian, MD as PCP - Cardiology (Cardiology) °Stanton, Kristi D, RN as Oncology Nurse Navigator ° °CHIEF COMPLAINTS/PURPOSE OF CONSULTATION: COLON CANCER ° °#  °Oncology History Overview Note  ° ° °# Cololoscoy prior- 5.8 prior ° °Procedure: Subtotal colectomy  ° °TUMOR  °Tumor Site: Descending colon  °Histologic Type: Adenocarcinoma  °Histologic Grade: G2, moderately differentiated  °Tumor Size: Greatest dimension: 4.5 cm  °Tumor Extent: Invades through the muscularis propria into pericolorectal  °tissue, see comment  °Macroscopic Tumor Perforation: Not identified, see comment  °Lymph-Vascular Invasion: Not identified  °Perineural Invasion: Present  °Treatment Effect: No known surgical therapy  ° °MARGINS  °Margin Status for Invasive Carcinoma: All margins negative for invasive  °carcinoma  °Margins examined: Proximal, distal, mesenteric  °Margin Status for Non-Invasive Tumor: All margins negative for  °high-grade dysplasia/intramucosal carcinoma and low-grade dysplasia  ° °REGIONAL LYMPH NODES  °Regional Lymph Nodes: All regional lymph nodes negative for tumor  °Number of lymph nodes examined: 29  °Tumor Deposits: Not identified  ° °PATHOLOGIC STAGE CLASSIFICATION (pTNM, AJCC 8th Edition):  °TNM Descriptors: Not applicable  °pT3 at least, see comment  °pN0  °pM - Not applicable  ° ° °# JAN 2023-rising tumor marker [Ct DNA]-CT scan-liver lesion lung nodules/FEB 2023- PET scan-progressive disease. ° °# MARCH 6th- FOLFOX [bev ineligible-stroke 2021] ° ° ° ° °# HLP; Acute Stroke [2021s/p TPA;[facility]]; Gout on allopurinol °  °Cancer of left colon (HCC)  °11/30/2020 Initial Diagnosis  ° Cancer of left colon (HCC) °  °12/29/2020 Cancer Staging  ° Staging form: Colon and Rectum, AJCC 8th Edition °- Pathologic: Stage IIA (pT3, pN0, cM0) - Signed by ,  R, MD on 12/29/2020 °Total  positive nodes: 0 ° °  °05/31/2021 Cancer Staging  ° Staging form: Colon and Rectum, AJCC 8th Edition °- Pathologic: Stage IVB (pTX, pNX, cM1b) - Signed by ,  R, MD on 05/31/2021 ° °  °06/07/2021 -  Chemotherapy  ° Patient is on Treatment Plan : COLORECTAL FOLFOX q14d  °   ° ° °HISTORY OF PRESENTING ILLNESS: Alone.  Ambulating independently. ° °Jenna [name] 80 y.o.  female history of recurrent/stage IV colon cancer is here to review the results of the PET scan and to proceed with chemotherapy ° °In the interim patient underwent port placement.  ° °Patient is more mobile this time.  Denies any falls.  She has been walking independently. ° °Otherwise no nausea no vomiting.  No abdominal pain.  No diarrhea. ° °Review of Systems  °Constitutional:  Positive for malaise/fatigue. Negative for chills, diaphoresis, fever and weight loss.  °HENT:  Negative for nosebleeds and sore throat.   °Eyes:  Negative for double vision.  °Respiratory:  Negative for cough, hemoptysis, sputum production, shortness of breath and wheezing.   °Cardiovascular:  Negative for chest pain, palpitations, orthopnea and leg swelling.  °Gastrointestinal:  Negative for abdominal pain, blood in stool, constipation, diarrhea, heartburn, melena, nausea and vomiting.  °Genitourinary:  Negative for dysuria, frequency and urgency.  °Musculoskeletal:  Positive for back pain and joint pain.  °Skin: Negative.  Negative for itching and rash.  °Neurological:  Negative for dizziness, tingling, focal weakness, weakness and headaches.  °Endo/Heme/Allergies:  Does not bruise/bleed easily.  °Psychiatric/Behavioral:  Negative for depression. The patient is not nervous/anxious and does not have insomnia.   ° ° °MEDICAL HISTORY:  °Past Medical History:  °Diagnosis Date  °   BRCA negative 03/2020  ° MyRisk neg except MSH3 VUS  ° Colon cancer (HCC)   ° Family history of breast cancer   ° IBIS=4.4%/riskscore=2.9%  ° Gout   ° Hyperlipidemia   ° Hypertension    ° Stroke (HCC) 05/2019  ° Thyroid disease   ° ° °SURGICAL HISTORY: °Past Surgical History:  °Procedure Laterality Date  ° APPLICATION OF WOUND VAC  11/16/2020  ° Procedure: APPLICATION OF WOUND VAC;  Surgeon: Cannon, Jennifer, MD;  Location: [facility] ORS;  Service: General;;  Serial # [id number]  ° BREAST BIOPSY Right 1978  ° neg  ° CHOLECYSTECTOMY    ° COLECTOMY WITH COLOSTOMY CREATION/HARTMANN PROCEDURE N/A 11/16/2020  ° Procedure: SUBTOTAL COLECTOMY WITH ILEOSTOMY CREATION;  Surgeon: Cannon, Jennifer, MD;  Location: [facility] ORS;  Service: General;  Laterality: N/A;  ° LAPAROTOMY N/A 11/16/2020  ° Procedure: EXPLORATORY LAPAROTOMY;  Surgeon: Cannon, Jennifer, MD;  Location: [facility] ORS;  Service: General;  Laterality: N/A;  ° PORTACATH PLACEMENT N/A 06/03/2021  ° Procedure: INSERTION PORT-A-CATH;  Surgeon: Pabon, Diego F, MD;  Location: [facility] ORS;  Service: General;  Laterality: N/A;  Provider requesting 1 hour / 60 minutes for procedure.  ° ° °SOCIAL HISTORY: °Social History  ° °Socioeconomic History  ° Marital status: Married  °  Spouse name: Not on file  ° Number of children: Not on file  ° Years of education: Not on file  ° Highest education level: Not on file  °Occupational History  ° Not on file  °Tobacco Use  ° Smoking status: Every Day  °  Packs/day: 0.25  °  Types: Cigarettes  ° Smokeless tobacco: Never  ° Tobacco comments:  °  smoking 4-5 cigarettes daily.   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 Lives with husband in [place].   ° °Social Determinants of Health  ° °Financial Resource Strain: Not on file  °Food Insecurity: Not on file  °Transportation Needs: Not on file  °Physical Activity: Not on file  °Stress: Not on file  °Social Connections: Not on file  °Intimate  Partner Violence: Not on file  ° ° °FAMILY HISTORY: °Family History  °Problem Relation Age of Onset  ° Breast cancer Maternal Grandmother 70  ° Breast cancer Cousin 40  ° ° °ALLERGIES:  is allergic to nicotine. ° °MEDICATIONS:  °Current Outpatient Medications  °Medication Sig Dispense Refill  ° allopurinol (ZYLOPRIM) 100 MG tablet Take 100 mg by mouth every evening.    ° fenofibrate 54 MG tablet Take 54 mg by mouth every evening.    ° levothyroxine (SYNTHROID) 50 MCG tablet Take 50 mcg by mouth daily.    ° lidocaine-prilocaine (EMLA) cream Apply small amount over port site 2 hours prior to it being accessed and cover with plastic each time port is to be accessed 30 g 1  ° rosuvastatin (CRESTOR) 40 MG tablet Take 40 mg by mouth every evening.    ° aspirin EC 81 MG tablet Take 81 mg by mouth every evening. (Patient not taking: Reported on 06/07/2021)    ° °No current facility-administered medications for this visit.  ° °Facility-Administered Medications Ordered in Other Visits  °Medication Dose Route Frequency Provider Last Rate Last Admin  ° fluorouracil (ADRUCIL) 4,300   mg in sodium chloride 0.9 % 64 mL chemo infusion  2,400 mg/m2 (Treatment Plan Recorded) Intravenous 1 day or 1 dose ,  R, MD   4,300 mg at 06/07/21 1232  ° ° °  °. ° °PHYSICAL EXAMINATION: °ECOG PERFORMANCE STATUS: 1 - Symptomatic but completely ambulatory ° °Vitals:  ° 06/07/21 0831  °BP: 138/90  °Pulse: 78  °Temp: 98.2 °F (36.8 °C)  °SpO2: 100%  ° ° °Filed Weights  ° 06/07/21 0831  °Weight: 159 lb 12.8 oz (72.5 kg)  ° ° °Physical Exam °Vitals and nursing note reviewed.  °HENT:  °   Head: Normocephalic and atraumatic.  °   Mouth/Throat:  °   Pharynx: Oropharynx is clear.  °Eyes:  °   Extraocular Movements: Extraocular movements intact.  °   Pupils: Pupils are equal, round, and reactive to light.  °Cardiovascular:  °   Rate and Rhythm: Normal rate and regular rhythm.  °Pulmonary:  °   Comments: Decreased breath sounds bilaterally.   °Abdominal:  °   Palpations: Abdomen is soft.  °Musculoskeletal:     °   General: Normal range of motion.  °   Cervical back: Normal range of motion.  °Skin: °   General: Skin is warm.  °Neurological:  °   General: No focal deficit present.  °   Mental Status: She is alert and oriented to person, place, and time.  °Psychiatric:     °   Behavior: Behavior normal.     °   Judgment: Judgment normal.  ° ° ° ° °LABORATORY DATA:  °I have reviewed the data as listed °Lab Results  °Component Value Date  ° WBC 8.2 06/07/2021  ° HGB 12.8 06/07/2021  ° HCT 39.5 06/07/2021  ° MCV 91.6 06/07/2021  ° PLT 202 06/07/2021  ° °Recent Labs  °  11/30/20 °1301 12/28/20 °1551 05/12/21 °1437 06/07/21 °0802  °NA 133* 132* 138 135  °K 5.3* 4.1 4.2 3.8  °CL 99 102 107 106  °CO2 23 20* 24 19*  °GLUCOSE 108* 95 105* 105*  °BUN 27* 18 26* 23  °CREATININE 1.01* 0.94 1.17* 1.03*  °CALCIUM 9.7 8.9 9.1 9.0  °GFRNONAA 57* >60 48* 56*  °PROT 8.0  --  7.3 7.4  °ALBUMIN 3.8  --  4.4 4.1  °AST 21  --  18 22  °ALT 17  --  13 10  °ALKPHOS 47  --  44 41  °BILITOT 0.4  --  0.4 0.3  ° ° °RADIOGRAPHIC STUDIES: °I have personally reviewed the radiological images as listed and agreed with the findings in the report. °CT CHEST ABDOMEN PELVIS W CONTRAST ° °Result Date: 05/15/2021 °CLINICAL DATA:  Colon cancer restaging, status post subtotal colectomy with right lower quadrant end ileostomy, rising tumor marker. EXAM: CT CHEST, ABDOMEN, AND PELVIS WITH CONTRAST TECHNIQUE: Multidetector CT imaging of the chest, abdomen and pelvis was performed following the standard protocol during bolus administration of intravenous contrast. RADIATION DOSE REDUCTION: This exam was performed according to the departmental dose-optimization program which includes automated exposure control, adjustment of the mA and/or kV according to patient size and/or use of iterative reconstruction technique. CONTRAST:  100mL OMNIPAQUE IOHEXOL 300 MG/ML SOLN, additional oral enteric contrast  COMPARISON:  11/14/2020 FINDINGS: CT CHEST FINDINGS Cardiovascular: Aortic atherosclerosis. Normal heart size. Left coronary artery calcifications. No pericardial effusion. Mediastinum/Nodes: No enlarged mediastinal, hilar, or axillary lymph nodes. Thyroid gland, trachea, and esophagus demonstrate no significant findings. Lungs/Pleura: Finely spiculated nodule of the posterior   right upper lobe measuring 0.9 x 0.7 cm (series 3, image 52). 0.3 cm nodule of the anterior right upper lobe (series 3, image 79). Additional benign, incidental calcified nodule of the posterior right upper lobe (series 3, image 43). Background of very fine centrilobular pulmonary nodules, concentrated in the lung apices. Mild, diffusely mosaic attenuation of the airspaces. Mild, bandlike scarring of the right middle lobe, lingula, and bilateral lung bases. No pleural effusion or pneumothorax. Musculoskeletal: No chest wall mass or suspicious osseous lesions identified. CT ABDOMEN PELVIS FINDINGS Hepatobiliary: There is a new, faintly rim hypoenhancing subcapsular lesion of the anterior right lobe of the liver, hepatic segment V/VI measuring 1.1 x 0.8 cm (series 2, image 75). Additional tiny low-attenuation lesions of the right lobe of the liver are unchanged, measuring no greater than 0.3 cm, for example in the inferior tip of the right lobe of the liver (series 2, image 86) status post cholecystectomy. No biliary dilatation. Pancreas: Unremarkable. No pancreatic ductal dilatation or surrounding inflammatory changes. Spleen: Normal in size without significant abnormality. Adrenals/Urinary Tract: Adrenal glands are unremarkable. Kidneys are normal, without renal calculi, solid lesion, or hydronephrosis. Bladder is unremarkable. Stomach/Bowel: Stomach is within normal limits. Status post interval subtotal colectomy with right lower quadrant end ileostomy. Severe diverticulosis of the remaining sigmoid colon (series 2, image 106).  Vascular/Lymphatic: Aortic atherosclerosis. No enlarged abdominal or pelvic lymph nodes. Reproductive: Status post hysterectomy. Other: No abdominal wall hernia or abnormality. No ascites. Musculoskeletal: No acute osseous findings. Status post left hip total arthroplasty. IMPRESSION: 1. Status post interval subtotal colectomy with right lower quadrant end ileostomy. 2. There is a new, faintly rim hypoenhancing subcapsular lesion of the anterior right lobe of the liver, consistent with a hepatic metastasis. 3. Finely spiculated nodule of the posterior right upper lobe measuring 0.9 x 0.7 cm, as well as a smaller nodule of the right upper lobe measuring 0.3 cm, highly suspicious for pulmonary metastases. No prior imaging of the chest is available. 4. Background of very fine centrilobular pulmonary nodules, concentrated in the lung apices, most commonly seen in smoking-related respiratory bronchiolitis. 5. Mild, diffusely mosaic attenuation of the airspaces, suggestive of small airways disease. 6. Coronary artery disease. These results will be called to the ordering clinician or representative by the Radiologist Assistant, and communication documented in the PACS or [employer]. Aortic Atherosclerosis (ICD10-I70.0). Electronically Signed   By: Delanna Ahmadi M.D.   On: 05/15/2021 19:54   NM PET Image Initial (PI) Skull Base To Thigh (F-18 FDG)  Result Date: 05/21/2021 CLINICAL DATA:  Initial treatment strategy for history of LEFT colon cancer. EXAM: NUCLEAR MEDICINE PET SKULL BASE TO THIGH TECHNIQUE: 8.76 mCi F-18 FDG was injected intravenously. Full-ring PET imaging was performed from the skull base to thigh after the radiotracer. CT data was obtained and used for attenuation correction and anatomic localization. Fasting blood glucose: 100 mg/dl COMPARISON:  May 14, 2021. FINDINGS: Mediastinal blood pool activity: SUV max 2.74 Liver activity: SUV max NA NECK: RIGHT nasal cavity lesion (image 18/3) this  measures 11 mm and shows a maximum SUV of 19. This is found along the posterior margin of the septum to the RIGHT of the septum. LEFT parotid lesion (image 35/3) this measures 13 mm with a maximum SUV of 10.0. No additional areas of increased metabolic activity neck or head, visualized portions of the head. Incidental CT findings: none CHEST: Nodule in the RIGHT upper lobe displays a maximum SUV of 3.5. Lobular margins, (image 86/3) this previously  measured approximally 8 mm to 9 mm and is stable. Granuloma in the RIGHT upper lobe as well more posteriorly. Small nodule in the RIGHT upper lobe (image 103/3) 3 mm. Not associated with increased metabolic activity. No signs of hypermetabolic lymph nodes in the chest. Incidental CT findings: Aortic atherosclerosis. No aneurysmal dilation. Normal heart size without substantial pericardial effusion. Normal caliber of the central pulmonary vessels. Limited assessment of cardiovascular structures given lack of intravenous contrast. ABDOMEN/PELVIS: Small focus of hypoattenuation along the margin of the RIGHT hemi liver difficult to visualize on the current exam, hepatic subsegment VI (image 169/3) this measures 11 mm and is stable with a maximum SUV of 4.9. No additional areas of increased metabolic activity in the abdomen or the pelvis. Incidental CT findings: Post cholecystectomy. No acute pancreatic process or splenic process. Adrenal glands are normal. No hydronephrosis. Urinary bladder under distended limiting assessment. No signs of acute gastrointestinal process with changes of colectomy to the sigmoid level with RIGHT lower quadrant ileostomy and long colonic pouch as before. SKELETON: No focal hypermetabolic activity to suggest skeletal metastasis. Incidental CT findings: LEFT hip arthroplasty with resultant streak artifact. Spinal degenerative changes. IMPRESSION: Findings of LEFT parotid lesion with marked hyper metabolic features. Findings more likely related to  primary parotid neoplasm though could represent cervical adenopathy. Consider ultrasound and sampling given the presence of hypermetabolic sinonasal lesion discussed. Small sinonasal lesion with marked hypermetabolic features. This is concerning for small neoplasm in the nasal cavity, direct visualization is suggested. RIGHT upper lobe pulmonary nodule with mildly increased metabolic activity suspicious for neoplasm, only a single additional nodule is noted and this finding is in the upper lobe. Metastatic disease versus is second primary in the lung is considered. Findings of hepatic metastatic disease. Aortic atherosclerosis. Postoperative changes related to prior colectomy RIGHT-sided ostomy. Aortic Atherosclerosis (ICD10-I70.0). Electronically Signed   By: Zetta Bills M.D.   On: 05/21/2021 16:02   DG CHEST PORT 1 VIEW  Result Date: 06/03/2021 CLINICAL DATA:  Provided history: Status post right-sided port placement. EXAM: PORTABLE CHEST 1 VIEW COMPARISON:  PET CT 05/20/2021. CT chest/abdomen/pelvis 05/14/2021. Prior chest radiographs 11/19/2020 and earlier. FINDINGS: A right chest infusion port catheter is present with tip projecting at the level of the SVC. Heart size at the upper limits of normal. Aortic atherosclerosis. Known right upper lobe pulmonary nodules were better appreciated on the recent prior PET CT of 05/20/2021. Redemonstrated right upper lobe calcified granuloma. Mild linear atelectasis within the right lung base. No appreciable airspace consolidation or pulmonary edema. No evidence of pleural effusion or pneumothorax. No acute bony abnormality identified. IMPRESSION: A right chest infusion port catheter is present with tip projecting at the level of the SVC. Known right upper lobe pulmonary nodules were better appreciated on the recent prior PET-CT of 05/10/2021. Mild linear atelectasis within the right lung base. Aortic Atherosclerosis (ICD10-I70.0). Electronically Signed   By: Kellie Simmering D.O.   On: 06/03/2021 10:14   DG C-Arm 1-60 Min-No Report  Result Date: 06/03/2021 Fluoroscopy was utilized by the requesting physician.  No radiographic interpretation.    ASSESSMENT & PLAN:   Cancer of left colon ([place]) #Recurrent/stage IV left colon cancer [rising tumor marker/CT DNA] CT scan- 2/11/2-23-right hepatic lobe liver lesion; spiculated nodule of the posterior right upper lobe measuring 0.9 x 0.7 cm, as well as a smaller nodule of the right upper lobe measuring 0.3 cm, highly suspicious for pulmonary metastases. FEB 2023- PET scan -confirms above findings.  #I had  a long discussion with the patient regarding the incurable nature of the disease given the liver metastases/lung metastasis.  I would recommend palliative chemotherapy with FOLFOX. ° °# proceed with FOLFOX; [dose reduced- 60 mg/m2]CBC CMP are reviewed; adequate today; no contraindications to chemotherapy. Proceed with treatment today.  ° °#History of stroke gait instability [2021 s/p TPA-  Ineligible for bevacizumab]-monitor closely on chemotherapy. OK to start asprin [after port].  ° °#Chronic low back pain-stable unrelated to malignancy.    ° °#Incidental findings on Imaging-PET scan February 2023: LEFT parotid lesion with marked hyper metabolic; and also hypermetabolic sinonasal lesion [patient s/p previous evaluation work-up Bennett; I discussed with Dr. Bennett appointment in April; coronary arteriosclerosis.  ° °# I reviewed the blood work- with the patient in detail; also reviewed the imaging independently [as summarized above]; and with the patient in detail.   ° °# I offered to call the patient's family to give an update on clinical status; patient declines.  She states that she will have her husband come with her at next visit.  She does not want to bother her children. ° °*NGS  °# DISPOSITION:  °# chemo today. °# cancel the 3/27 appt °#  follow up in 2 weeks- MD; labs- cbc/cmp; CEA; FOLFOX chemo; off on D-3.   Dr.B ° °# I reviewed the blood work- with the patient in detail; also reviewed the imaging independently [as summarized above]; and with the patient in detail.  ° °# 40 minutes face-to-face with the patient discussing the above plan of care; more than 50% of time spent on prognosis/ natural history; counseling and coordination. ° ° ° ° ° °All questions were answered. The patient knows to call the clinic with any problems, questions or concerns. ° ° ° °  °  R , MD °06/07/2021 4:38 PM ° ° ° ° °

## 2021-06-07 NOTE — Progress Notes (Signed)
Pt stopped ASA 81 mg for port placement, she would like to know if she should restart? ? ? ?

## 2021-06-08 LAB — CEA: CEA: 6.3 ng/mL — ABNORMAL HIGH (ref 0.0–4.7)

## 2021-06-09 ENCOUNTER — Other Ambulatory Visit: Payer: Self-pay

## 2021-06-09 ENCOUNTER — Telehealth: Payer: Self-pay

## 2021-06-09 ENCOUNTER — Inpatient Hospital Stay: Payer: Medicare HMO

## 2021-06-09 VITALS — BP 119/79 | HR 86 | Resp 18

## 2021-06-09 DIAGNOSIS — M5459 Other low back pain: Secondary | ICD-10-CM | POA: Diagnosis not present

## 2021-06-09 DIAGNOSIS — C186 Malignant neoplasm of descending colon: Secondary | ICD-10-CM | POA: Diagnosis not present

## 2021-06-09 DIAGNOSIS — C7801 Secondary malignant neoplasm of right lung: Secondary | ICD-10-CM | POA: Diagnosis not present

## 2021-06-09 DIAGNOSIS — Z8673 Personal history of transient ischemic attack (TIA), and cerebral infarction without residual deficits: Secondary | ICD-10-CM | POA: Diagnosis not present

## 2021-06-09 DIAGNOSIS — C787 Secondary malignant neoplasm of liver and intrahepatic bile duct: Secondary | ICD-10-CM | POA: Diagnosis not present

## 2021-06-09 DIAGNOSIS — Z5111 Encounter for antineoplastic chemotherapy: Secondary | ICD-10-CM | POA: Diagnosis not present

## 2021-06-09 DIAGNOSIS — I2510 Atherosclerotic heart disease of native coronary artery without angina pectoris: Secondary | ICD-10-CM | POA: Diagnosis not present

## 2021-06-09 MED ORDER — SODIUM CHLORIDE 0.9% FLUSH
10.0000 mL | INTRAVENOUS | Status: DC | PRN
  Administered 2021-06-09: 10 mL
  Filled 2021-06-09: qty 10

## 2021-06-09 MED ORDER — HEPARIN SOD (PORK) LOCK FLUSH 100 UNIT/ML IV SOLN
500.0000 [IU] | Freq: Once | INTRAVENOUS | Status: AC | PRN
  Administered 2021-06-09: 500 [IU]
  Filled 2021-06-09: qty 5

## 2021-06-09 NOTE — Telephone Encounter (Signed)
-----   Message from Leeann Must, [place] sent at 06/08/2021 10:56 AM EST ----- ?Regarding: RE: need to send [employer] [facility] ?Order #[id number] ?----- Message ----- ?From: Leeann Must, CMA ?Sent: 06/08/2021  10:55 AM EST ?To: Vanice Sarah, [place], Leeann Must, CMA ?Subject: FW: need to send [employer]               ? ?Form, demo and path faxed to [employer]. ?----- Message ----- ?From: Cammie Sickle, MD ?Sent: 06/07/2021   4:40 PM EST ?To: Vanice Sarah, [place], Maia Breslow, CMA, # ?Subject: need to send [employer]                   ? ?Thanks ? ?GB ? ? ? ?

## 2021-06-09 NOTE — Telephone Encounter (Signed)
Faxed

## 2021-06-10 ENCOUNTER — Encounter: Payer: Self-pay | Admitting: Surgery

## 2021-06-15 ENCOUNTER — Encounter: Payer: Self-pay | Admitting: Internal Medicine

## 2021-06-16 ENCOUNTER — Encounter: Payer: Self-pay | Admitting: Internal Medicine

## 2021-06-16 DIAGNOSIS — C186 Malignant neoplasm of descending colon: Secondary | ICD-10-CM | POA: Diagnosis not present

## 2021-06-18 DIAGNOSIS — C189 Malignant neoplasm of colon, unspecified: Secondary | ICD-10-CM | POA: Diagnosis not present

## 2021-06-18 DIAGNOSIS — I10 Essential (primary) hypertension: Secondary | ICD-10-CM | POA: Diagnosis not present

## 2021-06-18 DIAGNOSIS — E782 Mixed hyperlipidemia: Secondary | ICD-10-CM | POA: Diagnosis not present

## 2021-06-18 DIAGNOSIS — J301 Allergic rhinitis due to pollen: Secondary | ICD-10-CM | POA: Diagnosis not present

## 2021-06-18 DIAGNOSIS — E039 Hypothyroidism, unspecified: Secondary | ICD-10-CM | POA: Diagnosis not present

## 2021-06-20 ENCOUNTER — Other Ambulatory Visit: Payer: Self-pay | Admitting: Internal Medicine

## 2021-06-21 ENCOUNTER — Inpatient Hospital Stay: Payer: Medicare HMO

## 2021-06-21 ENCOUNTER — Other Ambulatory Visit: Payer: Self-pay

## 2021-06-21 ENCOUNTER — Encounter: Payer: Self-pay | Admitting: Nurse Practitioner

## 2021-06-21 ENCOUNTER — Inpatient Hospital Stay: Payer: Medicare HMO | Admitting: Nurse Practitioner

## 2021-06-21 VITALS — BP 123/83 | HR 82 | Temp 97.6°F | Resp 16 | Ht 64.0 in | Wt 160.4 lb

## 2021-06-21 DIAGNOSIS — C7801 Secondary malignant neoplasm of right lung: Secondary | ICD-10-CM | POA: Diagnosis not present

## 2021-06-21 DIAGNOSIS — Z8673 Personal history of transient ischemic attack (TIA), and cerebral infarction without residual deficits: Secondary | ICD-10-CM | POA: Diagnosis not present

## 2021-06-21 DIAGNOSIS — C186 Malignant neoplasm of descending colon: Secondary | ICD-10-CM | POA: Diagnosis not present

## 2021-06-21 DIAGNOSIS — Z5111 Encounter for antineoplastic chemotherapy: Secondary | ICD-10-CM | POA: Diagnosis not present

## 2021-06-21 DIAGNOSIS — I2510 Atherosclerotic heart disease of native coronary artery without angina pectoris: Secondary | ICD-10-CM | POA: Diagnosis not present

## 2021-06-21 DIAGNOSIS — C787 Secondary malignant neoplasm of liver and intrahepatic bile duct: Secondary | ICD-10-CM | POA: Diagnosis not present

## 2021-06-21 DIAGNOSIS — M5459 Other low back pain: Secondary | ICD-10-CM | POA: Diagnosis not present

## 2021-06-21 LAB — COMPREHENSIVE METABOLIC PANEL
ALT: 14 U/L (ref 0–44)
AST: 24 U/L (ref 15–41)
Albumin: 3.9 g/dL (ref 3.5–5.0)
Alkaline Phosphatase: 38 U/L (ref 38–126)
Anion gap: 7 (ref 5–15)
BUN: 19 mg/dL (ref 8–23)
CO2: 20 mmol/L — ABNORMAL LOW (ref 22–32)
Calcium: 8.8 mg/dL — ABNORMAL LOW (ref 8.9–10.3)
Chloride: 109 mmol/L (ref 98–111)
Creatinine, Ser: 0.95 mg/dL (ref 0.44–1.00)
GFR, Estimated: >60 mL/min (ref >60)
Glucose, Bld: 99 mg/dL (ref 70–99)
Potassium: 4.2 mmol/L (ref 3.5–5.1)
Sodium: 136 mmol/L (ref 135–145)
Total Bilirubin: 0.3 mg/dL (ref 0.3–1.2)
Total Protein: 6.8 g/dL (ref 6.5–8.1)

## 2021-06-21 LAB — CBC WITH DIFFERENTIAL/PLATELET
Abs Immature Granulocytes: 0.02 10*3/uL (ref 0.00–0.07)
Basophils Absolute: 0.1 10*3/uL (ref 0.0–0.1)
Basophils Relative: 1 %
Eosinophils Absolute: 0.2 10*3/uL (ref 0.0–0.5)
Eosinophils Relative: 3 %
HCT: 37.3 % (ref 36.0–46.0)
Hemoglobin: 12 g/dL (ref 12.0–15.0)
Immature Granulocytes: 0 %
Lymphocytes Relative: 34 %
Lymphs Abs: 2.1 10*3/uL (ref 0.7–4.0)
MCH: 30 pg (ref 26.0–34.0)
MCHC: 32.2 g/dL (ref 30.0–36.0)
MCV: 93.3 fL (ref 80.0–100.0)
Monocytes Absolute: 0.4 10*3/uL (ref 0.1–1.0)
Monocytes Relative: 6 %
Neutro Abs: 3.4 10*3/uL (ref 1.7–7.7)
Neutrophils Relative %: 56 %
Platelets: 215 10*3/uL (ref 150–400)
RBC: 4 MIL/uL (ref 3.87–5.11)
RDW: 14.6 % (ref 11.5–15.5)
WBC: 6.2 10*3/uL (ref 4.0–10.5)
nRBC: 0 % (ref 0.0–0.2)

## 2021-06-21 MED ORDER — DEXTROSE 5 % IV SOLN
Freq: Once | INTRAVENOUS | Status: AC
  Filled 2021-06-21: qty 250

## 2021-06-21 MED ORDER — SODIUM CHLORIDE 0.9 % IV SOLN
2400.0000 mg/m2 | INTRAVENOUS | Status: DC
  Administered 2021-06-21: 4300 mg via INTRAVENOUS
  Filled 2021-06-21: qty 86

## 2021-06-21 MED ORDER — LEUCOVORIN CALCIUM INJECTION 350 MG: 700.0000 mg | Freq: Once | INTRAVENOUS | Status: DC

## 2021-06-21 MED ORDER — PALONOSETRON HCL INJECTION 0.25 MG/5ML
0.2500 mg | Freq: Once | INTRAVENOUS | Status: AC
  Administered 2021-06-21: 0.25 mg via INTRAVENOUS
  Filled 2021-06-21: qty 5

## 2021-06-21 MED ORDER — OXALIPLATIN CHEMO INJECTION 100 MG/20ML
60.0000 mg/m2 | Freq: Once | INTRAVENOUS | Status: AC
  Administered 2021-06-21: 110 mg via INTRAVENOUS
  Filled 2021-06-21: qty 10

## 2021-06-21 MED ORDER — SODIUM CHLORIDE 0.9 % IV SOLN
10.0000 mg | Freq: Once | INTRAVENOUS | Status: AC
  Administered 2021-06-21: 10 mg via INTRAVENOUS
  Filled 2021-06-21: qty 10

## 2021-06-21 MED ORDER — LEUCOVORIN CALCIUM INJECTION 350 MG
700.0000 mg | Freq: Once | INTRAVENOUS | Status: AC
  Administered 2021-06-21: 700 mg via INTRAVENOUS
  Filled 2021-06-21: qty 35

## 2021-06-21 NOTE — Patient Instructions (Signed)
[facility]  Discharge Instructions: ?Thank you for choosing [name] to provide your oncology and hematology care.  ?If you have a lab appointment with the [name], please go directly to the [name] and check in at the registration area. ? ?Wear comfortable clothing and clothing appropriate for easy access to any Portacath or PICC line.  ? ?We strive to give you quality time with your provider. You may need to reschedule your appointment if you arrive late (15 or more minutes).  Arriving late affects you and other patients whose appointments are after yours.  Also, if you miss three or more appointments without notifying the office, you may be dismissed from the clinic at the provider?s discretion.    ?  ?For prescription refill requests, have your pharmacy contact our office and allow 72 hours for refills to be completed.   ? ?  ?To help prevent nausea and vomiting after your treatment, we encourage you to take your nausea medication as directed. ? ?BELOW ARE SYMPTOMS THAT SHOULD BE REPORTED IMMEDIATELY: ?*FEVER GREATER THAN 100.4 F (38 ?C) OR HIGHER ?*CHILLS OR SWEATING ?*NAUSEA AND VOMITING THAT IS NOT CONTROLLED WITH YOUR NAUSEA MEDICATION ?*UNUSUAL SHORTNESS OF BREATH ?*UNUSUAL BRUISING OR BLEEDING ?*URINARY PROBLEMS (pain or burning when urinating, or frequent urination) ?*BOWEL PROBLEMS (unusual diarrhea, constipation, pain near the anus) ?TENDERNESS IN MOUTH AND THROAT WITH OR WITHOUT PRESENCE OF ULCERS (sore throat, sores in mouth, or a toothache) ?UNUSUAL RASH, SWELLING OR PAIN  ?UNUSUAL VAGINAL DISCHARGE OR ITCHING  ? ?Items with * indicate a potential emergency and should be followed up as soon as possible or go to the Emergency Department if any problems should occur. ? ?Please show the CHEMOTHERAPY ALERT CARD or IMMUNOTHERAPY ALERT CARD at check-in to the Emergency Department and triage nurse. ? ?Should you have questions after your visit  or need to cancel or reschedule your appointment, please contact [facility]  [phone] and follow the prompts.  Office hours are 8:00 a.m. to 4:30 p.m. Monday - Friday. Please note that voicemails left after 4:00 p.m. may not be returned until the following business day.  We are closed weekends and major holidays. You have access to a nurse at all times for urgent questions. Please call the main number to the clinic [phone] and follow the prompts. ? ?For any non-urgent questions, you may also contact your provider using MyChart. We now offer e-Visits for anyone 18 and older to request care online for non-urgent symptoms. For details visit [email or web address]. ?  ?Also download the MyChart app! Go to the app store, search "MyChart", open the app, select [facility], and log in with your MyChart username and password. ? ?Due to Covid, a mask is required upon entering the hospital/clinic. If you do not have a mask, one will be given to you upon arrival. For doctor visits, patients may have 1 support person aged 18 or older with them. For treatment visits, patients cannot have anyone with them due to current Covid guidelines and our immunocompromised population.  ?

## 2021-06-21 NOTE — Progress Notes (Signed)
[facility] ?CONSULT NOTE ? ?Patient Care Team: ?Perrin Maltese, MD as PCP - General (Internal Medicine) ?Kate Sable, MD as PCP - Cardiology (Cardiology) ?Clent Jacks, RN as Oncology Nurse Navigator ? ?CHIEF COMPLAINTS/PURPOSE OF CONSULTATION: COLON CANCER ? ?#  ?Oncology History Overview Note  ? ? ?# Colol[place]oy prior- 5.8 prior ? ?Procedure: Subtotal colectomy  ? ?TUMOR  ?Tumor Site: Descending colon  ?Histologic Type: Adenocarcinoma  ?Histologic Grade: G2, moderately differentiated  ?Tumor Size: Greatest dimension: 4.5 cm  ?Tumor Extent: Invades through the muscularis propria into pericolorectal  ?tissue, see comment  ?Macroscopic Tumor Perforation: Not identified, see comment  ?Lymph-Vascular Invasion: Not identified  ?Perineural Invasion: Present  ?Treatment Effect: No known surgical therapy  ? ?MARGINS  ?Margin Status for Invasive Carcinoma: All margins negative for invasive  ?carcinoma  ?Margins examined: Proximal, distal, mesenteric  ?Margin Status for Non-Invasive Tumor: All margins negative for  ?high-grade dysplasia/intramucosal carcinoma and low-grade dysplasia  ? ?REGIONAL LYMPH NODES  ?Regional Lymph Nodes: All regional lymph nodes negative for tumor  ?Number of lymph nodes examined: 29  ?Tumor Deposits: Not identified  ? ?PATHOLOGIC STAGE CLASSIFICATION (pTNM, AJCC 8th Edition):  ?TNM Descriptors: Not applicable  ?pT3 at least, see comment  ?pN0  ?pM - Not applicable  ? ? ?# JAN [id number] tumor marker [Ct DNA]-CT scan-liver lesion lung nodules/FEB 2023- PET scan-progressive disease. ? ?# MARCH 6th- FOLFOX [bev ineligible-stroke 2021] ? ? ? ? ?# HLP; Acute Stroke [2021s/p TPA;[facility]]; Gout on allopurinol ?  ?Cancer of left colon ([place])  ?11/30/2020 Initial Diagnosis  ? Cancer of left colon [facility]) ?  ?12/29/2020 Cancer Staging  ? Staging form: Colon and Rectum, AJCC 8th Edition ?- Pathologic: Stage IIA (pT3, pN0, cM0) - Signed by Cammie Sickle, MD on 12/29/2020 ?Total  positive nodes: 0 ?  ?05/31/2021 Cancer Staging  ? Staging form: Colon and Rectum, AJCC 8th Edition ?- Pathologic: Stage IVB (pTX, pNX, cM1b) - Signed by Cammie Sickle, MD on 05/31/2021 ?  ?06/07/2021 -  Chemotherapy  ? Patient is on Treatment Plan : COLORECTAL FOLFOX q14d  ?   ? ? ?HISTORY OF PRESENTING ILLNESS: Alone.  Ambulating independently. ? ?[name] 81 y.o.  female history of recurrent/stage IV colon cancer is here for consideration of chemotherapy. She has port in place. Tolerating treatment well without complaints today. No diarrhea, numbness, tingling. No nausea or vomitng. Tired but not worse.  ? ? ? ?Review of Systems  ?Constitutional:  Positive for malaise/fatigue. Negative for chills, diaphoresis, fever and weight loss.  ?HENT:  Negative for nosebleeds and sore throat.   ?Eyes:  Negative for double vision.  ?Respiratory:  Negative for cough, hemoptysis, sputum production, shortness of breath and wheezing.   ?Cardiovascular:  Negative for chest pain, palpitations, orthopnea and leg swelling.  ?Gastrointestinal:  Negative for abdominal pain, blood in stool, constipation, diarrhea, heartburn, melena, nausea and vomiting.  ?Genitourinary:  Negative for dysuria, frequency and urgency.  ?Musculoskeletal:  Negative for back pain and joint pain.  ?Skin: Negative.  Negative for itching and rash.  ?Neurological:  Negative for dizziness, tingling, focal weakness, weakness and headaches.  ?Endo/Heme/Allergies:  Does not bruise/bleed easily.  ?Psychiatric/Behavioral:  Negative for depression. The patient is not nervous/anxious and does not have insomnia.   ? ? ?MEDICAL HISTORY:  ?Past Medical History:  ?Diagnosis Date  ? BRCA negative 03/2020  ? MyRisk neg except MSH3 VUS  ? Colon cancer ([place])   ? Family history of breast cancer   ?  IBIS=4.4%/riskscore=2.9%  ? Gout   ? Hyperlipidemia   ? Hypertension   ? Stroke [facility]) 05/2019  ? Thyroid disease   ? ? ?SURGICAL HISTORY: ?Past Surgical History:   ?Procedure Laterality Date  ? APPLICATION OF WOUND VAC  11/16/2020  ? Procedure: APPLICATION OF WOUND VAC;  Surgeon: Fredirick Maudlin, MD;  Location: [facility] ORS;  Service: General;;  Serial # [id number]  ? BREAST BIOPSY Right 1978  ? neg  ? CHOLECYSTECTOMY    ? COLECTOMY WITH COLOSTOMY CREATION/HARTMANN PROCEDURE N/A 11/16/2020  ? Procedure: SUBTOTAL COLECTOMY WITH ILEOSTOMY CREATION;  Surgeon: Fredirick Maudlin, MD;  Location: [facility] ORS;  Service: General;  Laterality: N/A;  ? LAPAROTOMY N/A 11/16/2020  ? Procedure: EXPLORATORY LAPAROTOMY;  Surgeon: Fredirick Maudlin, MD;  Location: [facility] ORS;  Service: General;  Laterality: N/A;  ? PORTACATH PLACEMENT N/A 06/03/2021  ? Procedure: INSERTION PORT-A-CATH;  Surgeon: Jules Husbands, MD;  Location: [facility] ORS;  Service: General;  Laterality: N/A;  Provider requesting 1 hour / 60 minutes for procedure.  ? ? ?SOCIAL HISTORY: ?Social History  ? ?Socioeconomic History  ? Marital status: Married  ?  Spouse name: Not on file  ? Number of children: Not on file  ? Years of education: Not on file  ? Highest education level: Not on file  ?Occupational History  ? Not on file  ?Tobacco Use  ? Smoking status: Every Day  ?  Packs/day: 0.25  ?  Types: Cigarettes  ? Smokeless tobacco: Never  ? Tobacco comments:  ?  smoking 4-5 cigarettes daily.   ?Vaping Use  ? Vaping Use: Never used  ?Substance and Sexual Activity  ? Alcohol use: No  ? Drug use: No  ? Sexual activity: Yes  ?  Birth control/protection: Post-menopausal  ?Other Topics Concern  ? Not on file  ?Social History Narrative  ? 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 Lives with husband in [place].   ? ?Social Determinants of Health  ? ?Financial Resource Strain: Not on file  ?Food Insecurity: Not on file  ?Transportation Needs: Not on file  ?Physical Activity: Not on file  ?Stress: Not on file  ?Social Connections: Not on file  ?Intimate Partner Violence: Not on file  ? ? ?FAMILY HISTORY: ?Family History  ?Problem Relation Age of  Onset  ? Breast cancer Maternal Grandmother 42  ? Breast cancer Cousin 7  ? ? ?ALLERGIES:  is allergic to nicotine. ? ?MEDICATIONS:  ?Current Outpatient Medications  ?Medication Sig Dispense Refill  ? allopurinol (ZYLOPRIM) 100 MG tablet Take 100 mg by mouth every evening.    ? aspirin EC 81 MG tablet Take 81 mg by mouth every evening.    ? fenofibrate 54 MG tablet Take 54 mg by mouth every evening.    ? levothyroxine (SYNTHROID) 50 MCG tablet Take 50 mcg by mouth daily.    ? lidocaine-prilocaine (EMLA) cream Apply small amount over port site 2 hours prior to it being accessed and cover with plastic each time port is to be accessed 30 g 1  ? rosuvastatin (CRESTOR) 40 MG tablet Take 40 mg by mouth every evening.    ? ondansetron (ZOFRAN) 8 MG tablet Take 8 mg by mouth 3 (three) times daily.    ? ?No current facility-administered medications for this visit.  ?  ?. ? ?PHYSICAL EXAMINATION: ?ECOG PERFORMANCE STATUS: 1 - Symptomatic but completely ambulatory ? ?Vitals:  ? 06/21/21 0849  ?BP: 123/83  ?Pulse: 82  ?Resp: 16  ?Temp: 97.6 ?F (36.4 ?C)  ?  SpO2: 99%  ? ? ?Filed Weights  ? 06/21/21 0849  ?Weight: 160 lb 6.4 oz (72.8 kg)  ? ? ?Physical Exam ?Vitals and nursing note reviewed.  ?Constitutional:   ?   Appearance: She is not ill-appearing.  ?Eyes:  ?   General: No scleral icterus. ?   Conjunctiva/sclera: Conjunctivae normal.  ?Cardiovascular:  ?   Rate and Rhythm: Normal rate and regular rhythm.  ?Abdominal:  ?   General: There is no distension.  ?   Palpations: Abdomen is soft.  ?   Tenderness: There is no abdominal tenderness. There is no guarding.  ?Musculoskeletal:     ?   General: No deformity.  ?   Right lower leg: No edema.  ?   Left lower leg: No edema.  ?Lymphadenopathy:  ?   Cervical: No cervical adenopathy.  ?Skin: ?   General: Skin is warm and dry.  ?Neurological:  ?   Mental Status: She is alert and oriented to person, place, and time. Mental status is at baseline.  ?Psychiatric:     ?   Mood and Affect:  Mood normal.     ?   Behavior: Behavior normal.  ? ? ? ? ?LABORATORY DATA:  ?I have reviewed the data as listed ?Lab Results  ?Component Value Date  ? WBC 6.2 06/21/2021  ? HGB 12.0 06/21/2021  ? HCT 37.3 03/20/2

## 2021-06-21 NOTE — Assessment & Plan Note (Addendum)
#  Recurrent/stage IV left colon cancer [rising tumor marker/CT DNA] CT scan- 2/11/2-23-right hepatic lobe liver lesion; spiculated nodule of the posterior right upper lobe measuring 0.9 x 0.7 cm, as well as a smaller nodule of the right upper lobe measuring 0.3 cm, highly suspicious for pulmonary metastases. FEB 2023- PET scan -confirms above findings. ?? ?# Labs reviewed today and acceptable for continuation of treatment, FOLFOX (dose reduced - 60 mg/m2).  ?? ?#History of stroke gait instability [2021 s/p TPA-  Ineligible for bevacizumab]-monitor closely on chemotherapy. OK to start asprin [after port].  ? ?# NGS- PD-L1/TPS- 0%.  ?? ?#Chronic low back pain-stable unrelated to malignancy.    ?? ?#Incidental findings on Imaging-PET scan February 2023: LEFT parotid lesion with marked hyper metabolic; and also hypermetabolic sinonasal lesion [patient s/p previous evaluation work-up Richardson Landry; I discussed with Dr. Richardson Landry appointment in April; coronary arteriosclerosis.  ?? ?# I reviewed the blood work- with the patient in detail; also reviewed the imaging independently [as summarized above]; and with the patient in detail.   ?? ?# Goals of care- treatment given with palliative intent.  ?? ?*NGS  ?# DISPOSITION:  ?# chemo today. ?# cancel the 3/27 appt ?#  follow up in 2 weeks- MD; labs- cbc/cmp; CEA; FOLFOX chemo; off on D-3.  Dr.B ?

## 2021-06-22 LAB — CEA: CEA: 8.5 ng/mL — ABNORMAL HIGH (ref 0.0–4.7)

## 2021-06-23 ENCOUNTER — Other Ambulatory Visit: Payer: Self-pay

## 2021-06-23 ENCOUNTER — Inpatient Hospital Stay: Payer: Medicare HMO

## 2021-06-23 DIAGNOSIS — C186 Malignant neoplasm of descending colon: Secondary | ICD-10-CM | POA: Diagnosis not present

## 2021-06-23 DIAGNOSIS — Z8673 Personal history of transient ischemic attack (TIA), and cerebral infarction without residual deficits: Secondary | ICD-10-CM | POA: Diagnosis not present

## 2021-06-23 DIAGNOSIS — M5459 Other low back pain: Secondary | ICD-10-CM | POA: Diagnosis not present

## 2021-06-23 DIAGNOSIS — Z5111 Encounter for antineoplastic chemotherapy: Secondary | ICD-10-CM | POA: Diagnosis not present

## 2021-06-23 DIAGNOSIS — I2510 Atherosclerotic heart disease of native coronary artery without angina pectoris: Secondary | ICD-10-CM | POA: Diagnosis not present

## 2021-06-23 DIAGNOSIS — C7801 Secondary malignant neoplasm of right lung: Secondary | ICD-10-CM | POA: Diagnosis not present

## 2021-06-23 DIAGNOSIS — C787 Secondary malignant neoplasm of liver and intrahepatic bile duct: Secondary | ICD-10-CM | POA: Diagnosis not present

## 2021-06-23 MED ORDER — HEPARIN SOD (PORK) LOCK FLUSH 100 UNIT/ML IV SOLN
500.0000 [IU] | Freq: Once | INTRAVENOUS | Status: AC | PRN
  Administered 2021-06-23: 500 [IU]
  Filled 2021-06-23: qty 5

## 2021-06-23 MED ORDER — SODIUM CHLORIDE 0.9% FLUSH
10.0000 mL | INTRAVENOUS | Status: DC | PRN
  Administered 2021-06-23: 10 mL
  Filled 2021-06-23: qty 10

## 2021-06-27 ENCOUNTER — Encounter: Payer: Self-pay | Admitting: Internal Medicine

## 2021-06-28 ENCOUNTER — Ambulatory Visit: Payer: Medicare HMO | Admitting: Internal Medicine

## 2021-06-28 ENCOUNTER — Telehealth: Payer: Self-pay | Admitting: *Deleted

## 2021-06-28 NOTE — Telephone Encounter (Signed)
Patient called to clarify her apt time on 07/05/21 with Ander Purpura, NP. I returned to her. Apt times confirmed with patient. ? ?She stated that she needed a cancer policy completed. I asked the patient to bring the forms to her next apt. She stated that she will need her path report and last office note to be attached to this cancer policy as well as any supportive documentation to assist with her claim. Abhishek Levesque/Andrea- FYI ?

## 2021-06-28 NOTE — Telephone Encounter (Signed)
[name]. Pt sch. For treatment next Monday, but I noticed that she did not have a d/c pump on the sch for Wed 4/5. Will you add this to pt's schedule. Thanks. ?

## 2021-07-05 ENCOUNTER — Inpatient Hospital Stay: Payer: Medicare HMO

## 2021-07-05 ENCOUNTER — Inpatient Hospital Stay: Payer: Medicare HMO | Admitting: Nurse Practitioner

## 2021-07-05 ENCOUNTER — Other Ambulatory Visit: Payer: Medicare HMO

## 2021-07-05 ENCOUNTER — Encounter: Payer: Self-pay | Admitting: Nurse Practitioner

## 2021-07-05 ENCOUNTER — Inpatient Hospital Stay: Payer: Medicare HMO | Attending: Internal Medicine

## 2021-07-05 VITALS — BP 127/75 | HR 71 | Temp 97.3°F | Resp 16 | Ht 64.0 in | Wt 160.0 lb

## 2021-07-05 DIAGNOSIS — R5383 Other fatigue: Secondary | ICD-10-CM | POA: Insufficient documentation

## 2021-07-05 DIAGNOSIS — T451X5A Adverse effect of antineoplastic and immunosuppressive drugs, initial encounter: Secondary | ICD-10-CM | POA: Diagnosis not present

## 2021-07-05 DIAGNOSIS — I2510 Atherosclerotic heart disease of native coronary artery without angina pectoris: Secondary | ICD-10-CM | POA: Diagnosis not present

## 2021-07-05 DIAGNOSIS — D6481 Anemia due to antineoplastic chemotherapy: Secondary | ICD-10-CM | POA: Diagnosis not present

## 2021-07-05 DIAGNOSIS — I10 Essential (primary) hypertension: Secondary | ICD-10-CM | POA: Diagnosis not present

## 2021-07-05 DIAGNOSIS — R079 Chest pain, unspecified: Secondary | ICD-10-CM | POA: Insufficient documentation

## 2021-07-05 DIAGNOSIS — Z5111 Encounter for antineoplastic chemotherapy: Secondary | ICD-10-CM | POA: Diagnosis not present

## 2021-07-05 DIAGNOSIS — M109 Gout, unspecified: Secondary | ICD-10-CM | POA: Diagnosis not present

## 2021-07-05 DIAGNOSIS — Z8673 Personal history of transient ischemic attack (TIA), and cerebral infarction without residual deficits: Secondary | ICD-10-CM | POA: Insufficient documentation

## 2021-07-05 DIAGNOSIS — G545 Neuralgic amyotrophy: Secondary | ICD-10-CM | POA: Insufficient documentation

## 2021-07-05 DIAGNOSIS — R531 Weakness: Secondary | ICD-10-CM | POA: Insufficient documentation

## 2021-07-05 DIAGNOSIS — F17210 Nicotine dependence, cigarettes, uncomplicated: Secondary | ICD-10-CM | POA: Diagnosis not present

## 2021-07-05 DIAGNOSIS — C186 Malignant neoplasm of descending colon: Secondary | ICD-10-CM | POA: Insufficient documentation

## 2021-07-05 DIAGNOSIS — R785 Finding of other psychotropic drug in blood: Secondary | ICD-10-CM | POA: Insufficient documentation

## 2021-07-05 LAB — COMPREHENSIVE METABOLIC PANEL
ALT: 14 U/L (ref 0–44)
AST: 23 U/L (ref 15–41)
Albumin: 3.8 g/dL (ref 3.5–5.0)
Alkaline Phosphatase: 39 U/L (ref 38–126)
Anion gap: 8 (ref 5–15)
BUN: 15 mg/dL (ref 8–23)
CO2: 20 mmol/L — ABNORMAL LOW (ref 22–32)
Calcium: 8.6 mg/dL — ABNORMAL LOW (ref 8.9–10.3)
Chloride: 107 mmol/L (ref 98–111)
Creatinine, Ser: 1.06 mg/dL — ABNORMAL HIGH (ref 0.44–1.00)
GFR, Estimated: 54 mL/min — ABNORMAL LOW (ref >60)
Glucose, Bld: 96 mg/dL (ref 70–99)
Potassium: 4.1 mmol/L (ref 3.5–5.1)
Sodium: 135 mmol/L (ref 135–145)
Total Bilirubin: 0.4 mg/dL (ref 0.3–1.2)
Total Protein: 6.1 g/dL — ABNORMAL LOW (ref 6.5–8.1)

## 2021-07-05 LAB — CBC WITH DIFFERENTIAL/PLATELET
Abs Immature Granulocytes: 0.02 10*3/uL (ref 0.00–0.07)
Basophils Absolute: 0.1 10*3/uL (ref 0.0–0.1)
Basophils Relative: 1 %
Eosinophils Absolute: 0.1 10*3/uL (ref 0.0–0.5)
Eosinophils Relative: 2 %
HCT: 35.2 % — ABNORMAL LOW (ref 36.0–46.0)
Hemoglobin: 11.5 g/dL — ABNORMAL LOW (ref 12.0–15.0)
Immature Granulocytes: 0 %
Lymphocytes Relative: 32 %
Lymphs Abs: 1.7 10*3/uL (ref 0.7–4.0)
MCH: 30.2 pg (ref 26.0–34.0)
MCHC: 32.7 g/dL (ref 30.0–36.0)
MCV: 92.4 fL (ref 80.0–100.0)
Monocytes Absolute: 0.4 10*3/uL (ref 0.1–1.0)
Monocytes Relative: 8 %
Neutro Abs: 3.1 10*3/uL (ref 1.7–7.7)
Neutrophils Relative %: 57 %
Platelets: 174 10*3/uL (ref 150–400)
RBC: 3.81 MIL/uL — ABNORMAL LOW (ref 3.87–5.11)
RDW: 15.2 % (ref 11.5–15.5)
WBC: 5.5 10*3/uL (ref 4.0–10.5)
nRBC: 0 % (ref 0.0–0.2)

## 2021-07-05 MED ORDER — PALONOSETRON HCL INJECTION 0.25 MG/5ML
0.2500 mg | Freq: Once | INTRAVENOUS | Status: AC
  Administered 2021-07-05: 0.25 mg via INTRAVENOUS
  Filled 2021-07-05: qty 5

## 2021-07-05 MED ORDER — SODIUM CHLORIDE 0.9 % IV SOLN
2400.0000 mg/m2 | INTRAVENOUS | Status: DC
  Administered 2021-07-05: 4300 mg via INTRAVENOUS
  Filled 2021-07-05: qty 86

## 2021-07-05 MED ORDER — OXALIPLATIN CHEMO INJECTION 100 MG/20ML
60.0000 mg/m2 | Freq: Once | INTRAVENOUS | Status: AC
  Administered 2021-07-05: 110 mg via INTRAVENOUS
  Filled 2021-07-05: qty 10

## 2021-07-05 MED ORDER — DEXTROSE 5 % IV SOLN
Freq: Once | INTRAVENOUS | Status: AC
  Filled 2021-07-05: qty 250

## 2021-07-05 MED ORDER — LEUCOVORIN CALCIUM INJECTION 350 MG
700.0000 mg | Freq: Once | INTRAVENOUS | Status: AC
  Administered 2021-07-05: 700 mg via INTRAVENOUS
  Filled 2021-07-05: qty 35

## 2021-07-05 MED ORDER — SODIUM CHLORIDE 0.9 % IV SOLN
10.0000 mg | Freq: Once | INTRAVENOUS | Status: AC
  Administered 2021-07-05: 10 mg via INTRAVENOUS
  Filled 2021-07-05: qty 10

## 2021-07-05 NOTE — Progress Notes (Signed)
[place] ?CONSULT NOTE ? ?Patient Care Team: ?Perrin Maltese, MD as PCP - General (Internal Medicine) ?Kate Sable, MD as PCP - Cardiology (Cardiology) ?Clent Jacks, RN as Oncology Nurse Navigator ? ?CHIEF COMPLAINTS/PURPOSE OF CONSULTATION: COLON CANCER ? ?#  ?Oncology History Overview Note  ? ? ?# [place]oy prior- 5.8 prior ? ?Procedure: Subtotal colectomy  ? ?TUMOR  ?Tumor Site: Descending colon  ?Histologic Type: Adenocarcinoma  ?Histologic Grade: G2, moderately differentiated  ?Tumor Size: Greatest dimension: 4.5 cm  ?Tumor Extent: Invades through the muscularis propria into pericolorectal  ?tissue, see comment  ?Macroscopic Tumor Perforation: Not identified, see comment  ?Lymph-Vascular Invasion: Not identified  ?Perineural Invasion: Present  ?Treatment Effect: No known surgical therapy  ? ?MARGINS  ?Margin Status for Invasive Carcinoma: All margins negative for invasive  ?carcinoma  ?Margins examined: Proximal, distal, mesenteric  ?Margin Status for Non-Invasive Tumor: All margins negative for  ?high-grade dysplasia/intramucosal carcinoma and low-grade dysplasia  ? ?REGIONAL LYMPH NODES  ?Regional Lymph Nodes: All regional lymph nodes negative for tumor  ?Number of lymph nodes examined: 29  ?Tumor Deposits: Not identified  ? ?PATHOLOGIC STAGE CLASSIFICATION (pTNM, AJCC 8th Edition):  ?TNM Descriptors: Not applicable  ?pT3 at least, see comment  ?pN0  ?pM - Not applicable  ? ? ?# JAN [id number] tumor marker [Ct DNA]-CT scan-liver lesion lung nodules/FEB 2023- PET scan-progressive disease. ? ?# MARCH 6th- FOLFOX [bev ineligible-stroke 2021] ? ? ? ? ?# HLP; Acute Stroke [2021s/p TPA;[facility]]; Gout on allopurinol ?  ?Cancer of left colon ([place])  ?11/30/2020 Initial Diagnosis  ? Cancer of left colon [facility]) ?  ?12/29/2020 Cancer Staging  ? Staging form: Colon and Rectum, AJCC 8th Edition ?- Pathologic: Stage IIA (pT3, pN0, cM0) - Signed by Cammie Sickle, MD on 12/29/2020 ?Total  positive nodes: 0 ?  ?05/31/2021 Cancer Staging  ? Staging form: Colon and Rectum, AJCC 8th Edition ?- Pathologic: Stage IVB (pTX, pNX, cM1b) - Signed by Cammie Sickle, MD on 05/31/2021 ?  ?06/07/2021 -  Chemotherapy  ? Patient is on Treatment Plan : COLORECTAL FOLFOX q14d  ?   ? ? ?HISTORY OF PRESENTING ILLNESS: Alone.  Ambulating independently. ? ?[name] 81 y.o.  female history of recurrent/stage IV colon cancer is here for consideration of chemotherapy. She has port in place. Tolerating treatment well. Felt tired and mildly nauseous for about 8 day s after treatment then symptoms resolved and she returned to baseline. No diarrhea, numbness, tingling, nausea or vomiting. Fatigue is stable.  ? ?Review of Systems  ?Constitutional:  Positive for malaise/fatigue. Negative for chills, diaphoresis, fever and weight loss.  ?HENT:  Negative for nosebleeds and sore throat.   ?Eyes:  Negative for double vision.  ?Respiratory:  Negative for cough, hemoptysis, sputum production, shortness of breath and wheezing.   ?Cardiovascular:  Negative for chest pain, palpitations, orthopnea and leg swelling.  ?Gastrointestinal:  Negative for abdominal pain, blood in stool, constipation, diarrhea, heartburn, melena, nausea and vomiting.  ?Genitourinary:  Negative for dysuria, frequency and urgency.  ?Musculoskeletal:  Negative for back pain and joint pain.  ?Skin: Negative.  Negative for itching and rash.  ?Neurological:  Negative for dizziness, tingling, focal weakness, weakness and headaches.  ?Endo/Heme/Allergies:  Does not bruise/bleed easily.  ?Psychiatric/Behavioral:  Negative for depression. The patient is not nervous/anxious and does not have insomnia.   ? ? ?MEDICAL HISTORY:  ?Past Medical History:  ?Diagnosis Date  ? BRCA negative 03/2020  ? MyRisk neg except MSH3 VUS  ?  Colon cancer ([place])   ? Family history of breast cancer   ? IBIS=4.4%/riskscore=2.9%  ? Gout   ? Hyperlipidemia   ? Hypertension   ? Stroke [facility])  05/2019  ? Thyroid disease   ? ? ?SURGICAL HISTORY: ?Past Surgical History:  ?Procedure Laterality Date  ? APPLICATION OF WOUND VAC  11/16/2020  ? Procedure: APPLICATION OF WOUND VAC;  Surgeon: Fredirick Maudlin, MD;  Location: [facility] ORS;  Service: General;;  Serial # [id number]  ? BREAST BIOPSY Right 1978  ? neg  ? CHOLECYSTECTOMY    ? COLECTOMY WITH COLOSTOMY CREATION/HARTMANN PROCEDURE N/A 11/16/2020  ? Procedure: SUBTOTAL COLECTOMY WITH ILEOSTOMY CREATION;  Surgeon: Fredirick Maudlin, MD;  Location: [facility] ORS;  Service: General;  Laterality: N/A;  ? LAPAROTOMY N/A 11/16/2020  ? Procedure: EXPLORATORY LAPAROTOMY;  Surgeon: Fredirick Maudlin, MD;  Location: [facility] ORS;  Service: General;  Laterality: N/A;  ? PORTACATH PLACEMENT N/A 06/03/2021  ? Procedure: INSERTION PORT-A-CATH;  Surgeon: Jules Husbands, MD;  Location: [facility] ORS;  Service: General;  Laterality: N/A;  Provider requesting 1 hour / 60 minutes for procedure.  ? ? ?SOCIAL HISTORY: ?Social History  ? ?Socioeconomic History  ? Marital status: Married  ?  Spouse name: Not on file  ? Number of children: Not on file  ? Years of education: Not on file  ? Highest education level: Not on file  ?Occupational History  ? Not on file  ?Tobacco Use  ? Smoking status: Every Day  ?  Packs/day: 0.25  ?  Types: Cigarettes  ? Smokeless tobacco: Never  ? Tobacco comments:  ?  smoking 4-5 cigarettes daily.   ?Vaping Use  ? Vaping Use: Never used  ?Substance and Sexual Activity  ? Alcohol use: No  ? Drug use: No  ? Sexual activity: Yes  ?  Birth control/protection: Post-menopausal  ?Other Topics Concern  ? Not on file  ?Social History Narrative  ? 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   ? ?Social Determinants of Health  ? ?Financial Resource Strain: Not on file  ?Food Insecurity: Not on file  ?Transportation Needs: Not on file  ?Physical Activity: Not on file  ?Stress: Not on file  ?Social Connections: Not on file  ?Intimate Partner Violence:  Not on file  ? ? ?FAMILY HISTORY: ?Family History  ?Problem Relation Age of Onset  ? Breast cancer Maternal Grandmother 54  ? Breast cancer Cousin 25  ? ? ?ALLERGIES:  is allergic to nicotine. ? ?MEDICATIONS:  ?Current Outpatient Medications  ?Medication Sig Dispense Refill  ? allopurinol (ZYLOPRIM) 100 MG tablet Take 100 mg by mouth every evening.    ? aspirin EC 81 MG tablet Take 81 mg by mouth every evening.    ? fenofibrate 54 MG tablet Take 54 mg by mouth every evening.    ? levothyroxine (SYNTHROID) 50 MCG tablet Take 50 mcg by mouth daily.    ? lidocaine-prilocaine (EMLA) cream Apply small amount over port site 2 hours prior to it being accessed and cover with plastic each time port is to be accessed 30 g 1  ? ondansetron (ZOFRAN) 8 MG tablet Take 8 mg by mouth 3 (three) times daily.    ? rosuvastatin (CRESTOR) 40 MG tablet Take 40 mg by mouth every evening.    ? ?No current facility-administered medications for this visit.  ?  ?. ? ?PHYSICAL EXAMINATION: ?ECOG PERFORMANCE STATUS: 1 - Symptomatic but completely ambulatory ? ?Vitals:  ? 07/05/21 0902  ?  BP: 127/75  ?Pulse: 71  ?Resp: 16  ?Temp: (!) 97.3 ?F (36.3 ?C)  ?SpO2: 100%  ? ? ?Filed Weights  ? 07/05/21 0902  ?Weight: 160 lb (72.6 kg)  ? ? ?Physical Exam ?Vitals and nursing note reviewed.  ?Constitutional:   ?   Appearance: She is not ill-appearing.  ?Eyes:  ?   General: No scleral icterus. ?   Conjunctiva/sclera: Conjunctivae normal.  ?Cardiovascular:  ?   Rate and Rhythm: Normal rate and regular rhythm.  ?Abdominal:  ?   General: There is no distension.  ?   Palpations: Abdomen is soft.  ?   Tenderness: There is no abdominal tenderness. There is no guarding.  ?   Comments: colostomy  ?Musculoskeletal:     ?   General: No deformity.  ?   Right lower leg: No edema.  ?   Left lower leg: No edema.  ?Lymphadenopathy:  ?   Cervical: No cervical adenopathy.  ?Skin: ?   General: Skin is warm and dry.  ?Neurological:  ?   Mental Status: She is alert and  oriented to person, place, and time. Mental status is at baseline.  ?Psychiatric:     ?   Mood and Affect: Mood normal.     ?   Behavior: Behavior normal.  ? ? ? ? ?LABORATORY DATA:  ?I have reviewed the data as

## 2021-07-05 NOTE — Patient Instructions (Signed)
[facility] CANCER CTR AT [place]  Discharge Instructions: ?Thank you for choosing [facility] to provide your oncology and hematology care.  ?If you have a lab appointment with the [facility], please go directly to the [facility] and check in at the registration area. ? ?Wear comfortable clothing and clothing appropriate for easy access to any Portacath or PICC line.  ? ?We strive to give you quality time with your provider. You may need to reschedule your appointment if you arrive late (15 or more minutes).  Arriving late affects you and other patients whose appointments are after yours.  Also, if you miss three or more appointments without notifying the office, you may be dismissed from the clinic at the provider?s discretion.    ?  ?For prescription refill requests, have your pharmacy contact our office and allow 72 hours for refills to be completed.   ? ?Today you received the following chemotherapy and/or immunotherapy agents Oxaliplatin, Leucovorin, Adrucil     ?  ?To help prevent nausea and vomiting after your treatment, we encourage you to take your nausea medication as directed. ? ?BELOW ARE SYMPTOMS THAT SHOULD BE REPORTED IMMEDIATELY: ?*FEVER GREATER THAN 100.4 F (38 ?C) OR HIGHER ?*CHILLS OR SWEATING ?*NAUSEA AND VOMITING THAT IS NOT CONTROLLED WITH YOUR NAUSEA MEDICATION ?*UNUSUAL SHORTNESS OF BREATH ?*UNUSUAL BRUISING OR BLEEDING ?*URINARY PROBLEMS (pain or burning when urinating, or frequent urination) ?*BOWEL PROBLEMS (unusual diarrhea, constipation, pain near the anus) ?TENDERNESS IN MOUTH AND THROAT WITH OR WITHOUT PRESENCE OF ULCERS (sore throat, sores in mouth, or a toothache) ?UNUSUAL RASH, SWELLING OR PAIN  ?UNUSUAL VAGINAL DISCHARGE OR ITCHING  ? ?Items with * indicate a potential emergency and should be followed up as soon as possible or go to the Emergency Department if any problems should occur. ? ?Please show the CHEMOTHERAPY ALERT CARD or IMMUNOTHERAPY  ALERT CARD at check-in to the Emergency Department and triage nurse. ? ?Should you have questions after your visit or need to cancel or reschedule your appointment, please contact [facility] CANCER [place] AT [place]  [phone] and follow the prompts.  Office hours are 8:00 a.m. to 4:30 p.m. Monday - Friday. Please note that voicemails left after 4:00 p.m. may not be returned until the following business day.  We are closed weekends and major holidays. You have access to a nurse at all times for urgent questions. Please call the main number to the clinic [phone] and follow the prompts. ? ?For any non-urgent questions, you may also contact your provider using MyChart. We now offer e-Visits for anyone 66 and older to request care online for non-urgent symptoms. For details visit [email or web address]. ?  ?Also download the MyChart app! Go to the app store, search "MyChart", open the app, select [place], and log in with your MyChart username and password. ? ?Due to Covid, a mask is required upon entering the hospital/clinic. If you do not have a mask, one will be given to you upon arrival. For doctor visits, patients may have 1 support person aged 26 or older with them. For treatment visits, patients cannot have anyone with them due to current Covid guidelines and our immunocompromised population.  ?

## 2021-07-06 LAB — CEA: CEA: 6.4 ng/mL — ABNORMAL HIGH (ref 0.0–4.7)

## 2021-07-07 ENCOUNTER — Inpatient Hospital Stay: Payer: Medicare HMO

## 2021-07-07 VITALS — BP 124/77 | HR 70 | Resp 18

## 2021-07-07 DIAGNOSIS — Z5111 Encounter for antineoplastic chemotherapy: Secondary | ICD-10-CM | POA: Diagnosis not present

## 2021-07-07 DIAGNOSIS — R531 Weakness: Secondary | ICD-10-CM | POA: Diagnosis not present

## 2021-07-07 DIAGNOSIS — M109 Gout, unspecified: Secondary | ICD-10-CM | POA: Diagnosis not present

## 2021-07-07 DIAGNOSIS — R079 Chest pain, unspecified: Secondary | ICD-10-CM | POA: Diagnosis not present

## 2021-07-07 DIAGNOSIS — I10 Essential (primary) hypertension: Secondary | ICD-10-CM | POA: Diagnosis not present

## 2021-07-07 DIAGNOSIS — R5383 Other fatigue: Secondary | ICD-10-CM | POA: Diagnosis not present

## 2021-07-07 DIAGNOSIS — R785 Finding of other psychotropic drug in blood: Secondary | ICD-10-CM | POA: Diagnosis not present

## 2021-07-07 DIAGNOSIS — F17210 Nicotine dependence, cigarettes, uncomplicated: Secondary | ICD-10-CM | POA: Diagnosis not present

## 2021-07-07 DIAGNOSIS — C186 Malignant neoplasm of descending colon: Secondary | ICD-10-CM

## 2021-07-07 MED ORDER — SODIUM CHLORIDE 0.9% FLUSH
10.0000 mL | INTRAVENOUS | Status: DC | PRN
  Administered 2021-07-07: 10 mL
  Filled 2021-07-07: qty 10

## 2021-07-07 MED ORDER — HEPARIN SOD (PORK) LOCK FLUSH 100 UNIT/ML IV SOLN
500.0000 [IU] | Freq: Once | INTRAVENOUS | Status: AC | PRN
  Administered 2021-07-07: 500 [IU]
  Filled 2021-07-07: qty 5

## 2021-07-12 ENCOUNTER — Encounter: Payer: Self-pay | Admitting: Internal Medicine

## 2021-07-19 ENCOUNTER — Inpatient Hospital Stay (HOSPITAL_BASED_OUTPATIENT_CLINIC_OR_DEPARTMENT_OTHER): Payer: Medicare HMO | Admitting: Internal Medicine

## 2021-07-19 ENCOUNTER — Inpatient Hospital Stay: Payer: Medicare HMO

## 2021-07-19 ENCOUNTER — Encounter: Payer: Self-pay | Admitting: Internal Medicine

## 2021-07-19 VITALS — BP 124/75 | HR 71 | Temp 98.7°F | Resp 20 | Wt 159.8 lb

## 2021-07-19 DIAGNOSIS — R531 Weakness: Secondary | ICD-10-CM | POA: Diagnosis not present

## 2021-07-19 DIAGNOSIS — R785 Finding of other psychotropic drug in blood: Secondary | ICD-10-CM | POA: Diagnosis not present

## 2021-07-19 DIAGNOSIS — Z5111 Encounter for antineoplastic chemotherapy: Secondary | ICD-10-CM | POA: Diagnosis not present

## 2021-07-19 DIAGNOSIS — C186 Malignant neoplasm of descending colon: Secondary | ICD-10-CM

## 2021-07-19 DIAGNOSIS — R5383 Other fatigue: Secondary | ICD-10-CM | POA: Diagnosis not present

## 2021-07-19 DIAGNOSIS — I10 Essential (primary) hypertension: Secondary | ICD-10-CM | POA: Diagnosis not present

## 2021-07-19 DIAGNOSIS — F17210 Nicotine dependence, cigarettes, uncomplicated: Secondary | ICD-10-CM | POA: Diagnosis not present

## 2021-07-19 DIAGNOSIS — M109 Gout, unspecified: Secondary | ICD-10-CM | POA: Diagnosis not present

## 2021-07-19 DIAGNOSIS — R079 Chest pain, unspecified: Secondary | ICD-10-CM | POA: Diagnosis not present

## 2021-07-19 LAB — CBC WITH DIFFERENTIAL/PLATELET
Abs Immature Granulocytes: 0.02 10*3/uL (ref 0.00–0.07)
Basophils Absolute: 0.1 10*3/uL (ref 0.0–0.1)
Basophils Relative: 1 %
Eosinophils Absolute: 0.2 10*3/uL (ref 0.0–0.5)
Eosinophils Relative: 3 %
HCT: 35 % — ABNORMAL LOW (ref 36.0–46.0)
Hemoglobin: 11.6 g/dL — ABNORMAL LOW (ref 12.0–15.0)
Immature Granulocytes: 0 %
Lymphocytes Relative: 31 %
Lymphs Abs: 1.9 10*3/uL (ref 0.7–4.0)
MCH: 31.1 pg (ref 26.0–34.0)
MCHC: 33.1 g/dL (ref 30.0–36.0)
MCV: 93.8 fL (ref 80.0–100.0)
Monocytes Absolute: 0.5 10*3/uL (ref 0.1–1.0)
Monocytes Relative: 8 %
Neutro Abs: 3.5 10*3/uL (ref 1.7–7.7)
Neutrophils Relative %: 57 %
Platelets: 141 10*3/uL — ABNORMAL LOW (ref 150–400)
RBC: 3.73 MIL/uL — ABNORMAL LOW (ref 3.87–5.11)
RDW: 15.8 % — ABNORMAL HIGH (ref 11.5–15.5)
WBC: 6 10*3/uL (ref 4.0–10.5)
nRBC: 0 % (ref 0.0–0.2)

## 2021-07-19 LAB — COMPREHENSIVE METABOLIC PANEL
ALT: 11 U/L (ref 0–44)
AST: 20 U/L (ref 15–41)
Albumin: 3.9 g/dL (ref 3.5–5.0)
Alkaline Phosphatase: 39 U/L (ref 38–126)
Anion gap: 14 (ref 5–15)
BUN: 30 mg/dL — ABNORMAL HIGH (ref 8–23)
CO2: 20 mmol/L — ABNORMAL LOW (ref 22–32)
Calcium: 9.2 mg/dL (ref 8.9–10.3)
Chloride: 104 mmol/L (ref 98–111)
Creatinine, Ser: 1.09 mg/dL — ABNORMAL HIGH (ref 0.44–1.00)
GFR, Estimated: 52 mL/min — ABNORMAL LOW (ref >60)
Glucose, Bld: 99 mg/dL (ref 70–99)
Potassium: 4.3 mmol/L (ref 3.5–5.1)
Sodium: 138 mmol/L (ref 135–145)
Total Bilirubin: 0.5 mg/dL (ref 0.3–1.2)
Total Protein: 6.9 g/dL (ref 6.5–8.1)

## 2021-07-19 MED ORDER — SODIUM CHLORIDE 0.9 % IV SOLN
10.0000 mg | Freq: Once | INTRAVENOUS | Status: AC
  Administered 2021-07-19: 10 mg via INTRAVENOUS
  Filled 2021-07-19: qty 10

## 2021-07-19 MED ORDER — LEUCOVORIN CALCIUM INJECTION 350 MG
700.0000 mg | Freq: Once | INTRAVENOUS | Status: AC
  Administered 2021-07-19: 700 mg via INTRAVENOUS
  Filled 2021-07-19: qty 35

## 2021-07-19 MED ORDER — VARENICLINE TARTRATE 0.5 MG PO TABS: 0.5000 mg | ORAL_TABLET | Freq: Two times a day (BID) | ORAL | 0 refills | Status: DC

## 2021-07-19 MED ORDER — HEPARIN SOD (PORK) LOCK FLUSH 100 UNIT/ML IV SOLN
500.0000 [IU] | Freq: Once | INTRAVENOUS | Status: DC
  Filled 2021-07-19: qty 5

## 2021-07-19 MED ORDER — SODIUM CHLORIDE 0.9 % IV SOLN
2400.0000 mg/m2 | INTRAVENOUS | Status: DC
  Administered 2021-07-19: 4300 mg via INTRAVENOUS
  Filled 2021-07-19: qty 86

## 2021-07-19 MED ORDER — SODIUM CHLORIDE 0.9% FLUSH
10.0000 mL | INTRAVENOUS | Status: DC | PRN
  Administered 2021-07-19: 10 mL via INTRAVENOUS
  Filled 2021-07-19: qty 10

## 2021-07-19 MED ORDER — OXALIPLATIN CHEMO INJECTION 100 MG/20ML
60.0000 mg/m2 | Freq: Once | INTRAVENOUS | Status: AC
  Administered 2021-07-19: 110 mg via INTRAVENOUS
  Filled 2021-07-19: qty 20

## 2021-07-19 MED ORDER — LEUCOVORIN CALCIUM INJECTION 350 MG
700.0000 mg | Freq: Once | INTRAVENOUS | Status: DC
  Filled 2021-07-19: qty 35

## 2021-07-19 MED ORDER — DEXTROSE 5 % IV SOLN
Freq: Once | INTRAVENOUS | Status: AC
  Filled 2021-07-19: qty 250

## 2021-07-19 MED ORDER — PALONOSETRON HCL INJECTION 0.25 MG/5ML
0.2500 mg | Freq: Once | INTRAVENOUS | Status: AC
  Administered 2021-07-19: 0.25 mg via INTRAVENOUS
  Filled 2021-07-19: qty 5

## 2021-07-19 NOTE — Assessment & Plan Note (Addendum)
#  Recurrent/stage IV left colon cancer [rising tumor marker/CT DNA] CT scan- 2/11/2-23-right hepatic lobe liver lesion; spiculated nodule of the posterior right upper lobe measuring 0.9 x 0.7 cm, as well as a smaller nodule of the right upper lobe measuring 0.3 cm, highly suspicious for pulmonary metastases. FEB 2023- PET scan -confirms above findings.  On palliative FOLFOX; NGS-no K-ras/NRAS mutations.;  Consider Panitumumab.  ?? ?#Proceed with FOLFOX No. 4 today.  Labs reviewed today and acceptable for continuation of treatment, FOLFOX (dose reduced - 60 mg/m2).  We will plan CT scan after 6 cycles. ? ?# Irritation of the stoma: discussed re: we will consider reversal [Dr.Pabone]; after reevaluation of interim scan. ?? ?#History of stroke gait instability [2021 s/p TPA-  Ineligible for bevacizumab]-monitor closely on chemotherapy. OK to start asprin [after port].  ? ?# NGS- PD-L1/TPS- 0%.  ?? ?#Chronic low back pain-stable unrelated to malignancy.    ? ?# Smoking cessatoin; interested in Chantix [previoulsy tried; ]; will send a script  ?? ?*NGS  ?# DISPOSITION:  ?# chemo today. ?#  follow up in 2 weeks- MD; labs- cbc/cmp; CEA; FOLFOX chemo; off on D-3.  Dr.B ?

## 2021-07-19 NOTE — Progress Notes (Signed)
I connected with Jenna [name] on 07/19/21 at  8:45 AM EDT by video enabled telemedicine visit and verified that I am speaking with the correct person using two identifiers.  ?I discussed the limitations, risks, security and privacy concerns of performing an evaluation and management service by telemedicine and the availability of in-person appointments. I also discussed with the patient that there may be a patient responsible charge related to this service. The patient expressed understanding and agreed to proceed.  ? ? ?Other persons participating in the visit and their role in the encounter: RN/medical reconciliation ?Patient?s location: office ?Provider?s location: home ? ?Oncology History Overview Note  ? ? ?# [place] prior- 5.8 prior ? ?Procedure: Subtotal colectomy  ? ?TUMOR  ?Tumor Site: Descending colon  ?Histologic Type: Adenocarcinoma  ?Histologic Grade: G2, moderately differentiated  ?Tumor Size: Greatest dimension: 4.5 cm  ?Tumor Extent: Invades through the muscularis propria into pericolorectal  ?tissue, see comment  ?Macroscopic Tumor Perforation: Not identified, see comment  ?Lymph-Vascular Invasion: Not identified  ?Perineural Invasion: Present  ?Treatment Effect: No known surgical therapy  ? ?MARGINS  ?Margin Status for Invasive Carcinoma: All margins negative for invasive  ?carcinoma  ?Margins examined: Proximal, distal, mesenteric  ?Margin Status for Non-Invasive Tumor: All margins negative for  ?high-grade dysplasia/intramucosal carcinoma and low-grade dysplasia  ? ?REGIONAL LYMPH NODES  ?Regional Lymph Nodes: All regional lymph nodes negative for tumor  ?Number of lymph nodes examined: 29  ?Tumor Deposits: Not identified  ? ?PATHOLOGIC STAGE CLASSIFICATION (pTNM, AJCC 8th Edition):  ?TNM Descriptors: Not applicable  ?pT3 at least, see comment  ?pN0  ?pM - Not applicable  ? ? ?# JAN [id number] tumor marker [Ct DNA]-CT scan-liver lesion lung nodules/FEB 2023- PET scan-progressive  disease. ? ?# MARCH 6th- FOLFOX [bev ineligible-stroke 2021] ? ? ? ? ?# HLP; Acute Stroke [2021s/p TPA;[facility]]; Gout on allopurinol ?  ?Cancer of left colon ([place])  ?11/30/2020 Initial Diagnosis  ? Cancer of left colon [facility]) ?  ?12/29/2020 Cancer Staging  ? Staging form: Colon and Rectum, AJCC 8th Edition ?- Pathologic: Stage IIA (pT3, pN0, cM0) - Signed by Cammie Sickle, MD on 12/29/2020 ?Total positive nodes: 0 ? ?  ?05/31/2021 Cancer Staging  ? Staging form: Colon and Rectum, AJCC 8th Edition ?- Pathologic: Stage IVB (pTX, pNX, cM1b) - Signed by Cammie Sickle, MD on 05/31/2021 ? ?  ?06/07/2021 -  Chemotherapy  ? Patient is on Treatment Plan : COLORECTAL FOLFOX q14d  ? ?  ?  ? ? ?Chief Complaint: colon cancer ? ?History of present illness:Jenna [name] 81 y.o.  female with history of metastatic colon cancer currently on FOLFOX palliative is here for follow-up. ? ?Patient denies any worsening diarrhea.  Denies any nausea vomiting abdominal pain.  Denies any worsening tingling or numbness.  No chest pain or shortness of breath or cough.  Complains of mild to moderate fatigue. ? ?Observation/objective: Alert & oriented x 3. In No acute distress.  ? ?Assessment and plan: ?Cancer of left colon ([place]) ?#Recurrent/stage IV left colon cancer [rising tumor marker/CT DNA] CT scan- 2/11/2-23-right hepatic lobe liver lesion; spiculated nodule of the posterior right upper lobe measuring 0.9 x 0.7 cm, as well as a smaller nodule of the right upper lobe measuring 0.3 cm, highly suspicious for pulmonary metastases. FEB 2023- PET scan -confirms above findings.  On palliative FOLFOX; NGS-no K-ras/NRAS mutations.;  Consider Panitumumab.  ?  ?#Proceed with FOLFOX No. 4 today.  Labs reviewed today and acceptable for continuation of  treatment, FOLFOX (dose reduced - 60 mg/m2).  We will plan CT scan after 6 cycles. ? ?# Irritation of the stoma: discussed re: we will consider reversal [Dr.Pabone]; after reevaluation of interim  scan. ?  ?#History of stroke gait instability [2021 s/p TPA-  Ineligible for bevacizumab]-monitor closely on chemotherapy. OK to start asprin [after port].  ? ?# NGS- PD-L1/TPS- 0%.  ?  ?#Chronic low back pain-stable unrelated to malignancy.    ? ?# Smoking cessatoin; interested in Chantix [previoulsy tried; ]; will send a script  ?  ?*NGS  ?# DISPOSITION:  ?# chemo today. ?#  follow up in 2 weeks- MD; labs- cbc/cmp; CEA; FOLFOX chemo; off on D-3.  Dr.B ? ? ? ?Follow-up instructions: ? ?I discussed the assessment and treatment plan with the patient.  The patient was provided an opportunity to ask questions and all were answered.  The patient agreed with the plan and demonstrated understanding of instructions. ? ?The patient was advised to call back or seek an in person evaluation if the symptoms worsen or if the condition fails to improve as anticipated. ? ? ? ?Dr. Charlaine Dalton ?[facility] at [facility] ?07/19/2021 ?10:59 AM ?

## 2021-07-19 NOTE — Patient Instructions (Signed)
[facility] CANCER CTR AT [place]  Discharge Instructions: ?Thank you for choosing [facility] to provide your oncology and hematology care.  ?If you have a lab appointment with the [facility], please go directly to the [place] and check in at the registration area. ? ?Wear comfortable clothing and clothing appropriate for easy access to any Portacath or PICC line.  ? ?We strive to give you quality time with your provider. You may need to reschedule your appointment if you arrive late (15 or more minutes).  Arriving late affects you and other patients whose appointments are after yours.  Also, if you miss three or more appointments without notifying the office, you may be dismissed from the clinic at the provider?s discretion.    ?  ?For prescription refill requests, have your pharmacy contact our office and allow 72 hours for refills to be completed.   ? ?Today you received the following chemotherapy and/or immunotherapy agents Oxaliplatin, Leucovorin, Adrucil.     ?  ?To help prevent nausea and vomiting after your treatment, we encourage you to take your nausea medication as directed. ? ?BELOW ARE SYMPTOMS THAT SHOULD BE REPORTED IMMEDIATELY: ?*FEVER GREATER THAN 100.4 F (38 ?C) OR HIGHER ?*CHILLS OR SWEATING ?*NAUSEA AND VOMITING THAT IS NOT CONTROLLED WITH YOUR NAUSEA MEDICATION ?*UNUSUAL SHORTNESS OF BREATH ?*UNUSUAL BRUISING OR BLEEDING ?*URINARY PROBLEMS (pain or burning when urinating, or frequent urination) ?*BOWEL PROBLEMS (unusual diarrhea, constipation, pain near the anus) ?TENDERNESS IN MOUTH AND THROAT WITH OR WITHOUT PRESENCE OF ULCERS (sore throat, sores in mouth, or a toothache) ?UNUSUAL RASH, SWELLING OR PAIN  ?UNUSUAL VAGINAL DISCHARGE OR ITCHING  ? ?Items with * indicate a potential emergency and should be followed up as soon as possible or go to the Emergency Department if any problems should occur. ? ?Please show the CHEMOTHERAPY ALERT CARD or IMMUNOTHERAPY  ALERT CARD at check-in to the Emergency Department and triage nurse. ? ?Should you have questions after your visit or need to cancel or reschedule your appointment, please contact [facility] CANCER [place] AT [place]  [phone] and follow the prompts.  Office hours are 8:00 a.m. to 4:30 p.m. Monday - Friday. Please note that voicemails left after 4:00 p.m. may not be returned until the following business day.  We are closed weekends and major holidays. You have access to a nurse at all times for urgent questions. Please call the main number to the clinic [phone] and follow the prompts. ? ?For any non-urgent questions, you may also contact your provider using MyChart. We now offer e-Visits for anyone 79 and older to request care online for non-urgent symptoms. For details visit [email or web address]. ?  ?Also download the MyChart app! Go to the app store, search "MyChart", open the app, select [place], and log in with your MyChart username and password. ? ?Due to Covid, a mask is required upon entering the hospital/clinic. If you do not have a mask, one will be given to you upon arrival. For doctor visits, patients may have 1 support person aged 45 or older with them. For treatment visits, patients cannot have anyone with them due to current Covid guidelines and our immunocompromised population.  ?

## 2021-07-20 LAB — CEA: CEA: 3.9 ng/mL (ref 0.0–4.7)

## 2021-07-21 ENCOUNTER — Inpatient Hospital Stay: Payer: Medicare HMO

## 2021-07-21 VITALS — BP 145/80 | HR 74 | Resp 18

## 2021-07-21 DIAGNOSIS — R785 Finding of other psychotropic drug in blood: Secondary | ICD-10-CM | POA: Diagnosis not present

## 2021-07-21 DIAGNOSIS — I10 Essential (primary) hypertension: Secondary | ICD-10-CM | POA: Diagnosis not present

## 2021-07-21 DIAGNOSIS — F17210 Nicotine dependence, cigarettes, uncomplicated: Secondary | ICD-10-CM | POA: Diagnosis not present

## 2021-07-21 DIAGNOSIS — C186 Malignant neoplasm of descending colon: Secondary | ICD-10-CM

## 2021-07-21 DIAGNOSIS — R5383 Other fatigue: Secondary | ICD-10-CM | POA: Diagnosis not present

## 2021-07-21 DIAGNOSIS — Z5111 Encounter for antineoplastic chemotherapy: Secondary | ICD-10-CM | POA: Diagnosis not present

## 2021-07-21 DIAGNOSIS — R531 Weakness: Secondary | ICD-10-CM | POA: Diagnosis not present

## 2021-07-21 DIAGNOSIS — M109 Gout, unspecified: Secondary | ICD-10-CM | POA: Diagnosis not present

## 2021-07-21 DIAGNOSIS — R079 Chest pain, unspecified: Secondary | ICD-10-CM | POA: Diagnosis not present

## 2021-07-21 MED ORDER — SODIUM CHLORIDE 0.9% FLUSH
10.0000 mL | INTRAVENOUS | Status: DC | PRN
  Administered 2021-07-21: 10 mL
  Filled 2021-07-21: qty 10

## 2021-07-21 MED ORDER — HEPARIN SOD (PORK) LOCK FLUSH 100 UNIT/ML IV SOLN
500.0000 [IU] | Freq: Once | INTRAVENOUS | Status: AC | PRN
  Administered 2021-07-21: 500 [IU]
  Filled 2021-07-21: qty 5

## 2021-08-02 ENCOUNTER — Inpatient Hospital Stay: Payer: Medicare HMO | Attending: Internal Medicine

## 2021-08-02 ENCOUNTER — Inpatient Hospital Stay (HOSPITAL_BASED_OUTPATIENT_CLINIC_OR_DEPARTMENT_OTHER): Payer: Medicare HMO | Admitting: Internal Medicine

## 2021-08-02 ENCOUNTER — Inpatient Hospital Stay: Payer: Medicare HMO

## 2021-08-02 DIAGNOSIS — C186 Malignant neoplasm of descending colon: Secondary | ICD-10-CM

## 2021-08-02 DIAGNOSIS — E079 Disorder of thyroid, unspecified: Secondary | ICD-10-CM | POA: Diagnosis not present

## 2021-08-02 DIAGNOSIS — G8929 Other chronic pain: Secondary | ICD-10-CM | POA: Insufficient documentation

## 2021-08-02 DIAGNOSIS — I10 Essential (primary) hypertension: Secondary | ICD-10-CM | POA: Diagnosis not present

## 2021-08-02 DIAGNOSIS — Z5111 Encounter for antineoplastic chemotherapy: Secondary | ICD-10-CM | POA: Diagnosis not present

## 2021-08-02 DIAGNOSIS — N2889 Other specified disorders of kidney and ureter: Secondary | ICD-10-CM | POA: Insufficient documentation

## 2021-08-02 DIAGNOSIS — I700 Atherosclerosis of aorta: Secondary | ICD-10-CM | POA: Insufficient documentation

## 2021-08-02 DIAGNOSIS — F17210 Nicotine dependence, cigarettes, uncomplicated: Secondary | ICD-10-CM | POA: Insufficient documentation

## 2021-08-02 DIAGNOSIS — Z8673 Personal history of transient ischemic attack (TIA), and cerebral infarction without residual deficits: Secondary | ICD-10-CM | POA: Diagnosis not present

## 2021-08-02 DIAGNOSIS — M109 Gout, unspecified: Secondary | ICD-10-CM | POA: Insufficient documentation

## 2021-08-02 DIAGNOSIS — M545 Low back pain, unspecified: Secondary | ICD-10-CM | POA: Diagnosis not present

## 2021-08-02 DIAGNOSIS — R110 Nausea: Secondary | ICD-10-CM | POA: Diagnosis not present

## 2021-08-02 DIAGNOSIS — N289 Disorder of kidney and ureter, unspecified: Secondary | ICD-10-CM | POA: Diagnosis not present

## 2021-08-02 DIAGNOSIS — E785 Hyperlipidemia, unspecified: Secondary | ICD-10-CM | POA: Insufficient documentation

## 2021-08-02 DIAGNOSIS — Z79899 Other long term (current) drug therapy: Secondary | ICD-10-CM | POA: Diagnosis not present

## 2021-08-02 DIAGNOSIS — D649 Anemia, unspecified: Secondary | ICD-10-CM | POA: Diagnosis not present

## 2021-08-02 DIAGNOSIS — Z7982 Long term (current) use of aspirin: Secondary | ICD-10-CM | POA: Diagnosis not present

## 2021-08-02 DIAGNOSIS — C182 Malignant neoplasm of ascending colon: Secondary | ICD-10-CM | POA: Insufficient documentation

## 2021-08-02 DIAGNOSIS — I2510 Atherosclerotic heart disease of native coronary artery without angina pectoris: Secondary | ICD-10-CM | POA: Insufficient documentation

## 2021-08-02 DIAGNOSIS — K435 Parastomal hernia without obstruction or  gangrene: Secondary | ICD-10-CM | POA: Insufficient documentation

## 2021-08-02 LAB — CBC WITH DIFFERENTIAL/PLATELET
Abs Immature Granulocytes: 0.02 10*3/uL (ref 0.00–0.07)
Basophils Absolute: 0.1 10*3/uL (ref 0.0–0.1)
Basophils Relative: 1 %
Eosinophils Absolute: 0.1 10*3/uL (ref 0.0–0.5)
Eosinophils Relative: 3 %
HCT: 33.8 % — ABNORMAL LOW (ref 36.0–46.0)
Hemoglobin: 11.2 g/dL — ABNORMAL LOW (ref 12.0–15.0)
Immature Granulocytes: 0 %
Lymphocytes Relative: 32 %
Lymphs Abs: 1.7 10*3/uL (ref 0.7–4.0)
MCH: 31 pg (ref 26.0–34.0)
MCHC: 33.1 g/dL (ref 30.0–36.0)
MCV: 93.6 fL (ref 80.0–100.0)
Monocytes Absolute: 0.5 10*3/uL (ref 0.1–1.0)
Monocytes Relative: 9 %
Neutro Abs: 3 10*3/uL (ref 1.7–7.7)
Neutrophils Relative %: 55 %
Platelets: 155 10*3/uL (ref 150–400)
RBC: 3.61 MIL/uL — ABNORMAL LOW (ref 3.87–5.11)
RDW: 16.1 % — ABNORMAL HIGH (ref 11.5–15.5)
WBC: 5.3 10*3/uL (ref 4.0–10.5)
nRBC: 0 % (ref 0.0–0.2)

## 2021-08-02 LAB — COMPREHENSIVE METABOLIC PANEL
ALT: 12 U/L (ref 0–44)
AST: 23 U/L (ref 15–41)
Albumin: 4 g/dL (ref 3.5–5.0)
Alkaline Phosphatase: 42 U/L (ref 38–126)
Anion gap: 8 (ref 5–15)
BUN: 21 mg/dL (ref 8–23)
CO2: 20 mmol/L — ABNORMAL LOW (ref 22–32)
Calcium: 8.6 mg/dL — ABNORMAL LOW (ref 8.9–10.3)
Chloride: 106 mmol/L (ref 98–111)
Creatinine, Ser: 1.08 mg/dL — ABNORMAL HIGH (ref 0.44–1.00)
GFR, Estimated: 53 mL/min — ABNORMAL LOW (ref >60)
Glucose, Bld: 117 mg/dL — ABNORMAL HIGH (ref 70–99)
Potassium: 3.9 mmol/L (ref 3.5–5.1)
Sodium: 134 mmol/L — ABNORMAL LOW (ref 135–145)
Total Bilirubin: 0.4 mg/dL (ref 0.3–1.2)
Total Protein: 6.9 g/dL (ref 6.5–8.1)

## 2021-08-02 MED ORDER — SODIUM CHLORIDE 0.9 % IV SOLN
10.0000 mg | Freq: Once | INTRAVENOUS | Status: AC
  Administered 2021-08-02: 10 mg via INTRAVENOUS
  Filled 2021-08-02: qty 10

## 2021-08-02 MED ORDER — PALONOSETRON HCL INJECTION 0.25 MG/5ML
0.2500 mg | Freq: Once | INTRAVENOUS | Status: AC
  Administered 2021-08-02: 0.25 mg via INTRAVENOUS
  Filled 2021-08-02: qty 5

## 2021-08-02 MED ORDER — DEXTROSE 5 % IV SOLN
Freq: Once | INTRAVENOUS | Status: AC
  Filled 2021-08-02: qty 250

## 2021-08-02 MED ORDER — OXALIPLATIN CHEMO INJECTION 100 MG/20ML
60.0000 mg/m2 | Freq: Once | INTRAVENOUS | Status: AC
  Administered 2021-08-02: 110 mg via INTRAVENOUS
  Filled 2021-08-02: qty 20

## 2021-08-02 MED ORDER — SODIUM CHLORIDE 0.9 % IV SOLN
2400.0000 mg/m2 | INTRAVENOUS | Status: DC
  Administered 2021-08-02: 4300 mg via INTRAVENOUS
  Filled 2021-08-02: qty 86

## 2021-08-02 MED ORDER — LEUCOVORIN CALCIUM INJECTION 350 MG
700.0000 mg | Freq: Once | INTRAVENOUS | Status: AC
  Administered 2021-08-02: 700 mg via INTRAVENOUS
  Filled 2021-08-02: qty 35

## 2021-08-02 NOTE — Progress Notes (Signed)
Since the last treatment patient has noticed SOBr on exertion which included a walk form the waiting room to exam room today.   ?

## 2021-08-02 NOTE — Assessment & Plan Note (Addendum)
#  Recurrent/stage IV left colon cancer [rising tumor marker/CT DNA] CT scan- 2/11/2-23-right hepatic lobe liver lesion; spiculated nodule of the posterior right upper lobe measuring 0.9 x 0.7 cm, as well as a smaller nodule of the right upper lobe measuring 0.3 cm, highly suspicious for pulmonary metastases. FEB 2023- PET scan -confirms above findings.  On palliative FOLFOX; NGS-no K-ras/NRAS mutations.;  Plan  Panitumumab.  ?? ?#Proceed with FOLFOX #5  today.  Labs reviewed today and acceptable for continuation of treatment, FOLFOX [dose reduced - 60 mg/m2].  We will plan CT scan after this cycle; add vectibex to next chemo. [infomed X-AL] ? ?# Dyspnea/fatigue: No clinical concerns for PE or any pulmonary/cardiac cause.  Suspect worsening fatigue from adjuvant chemotherapy-await above imaging/modify chemotherapy. ? ?# Irritation of the stoma: discussed re: we will consider reversal [Dr.Pabone]; after reevaluation of interim scan.  ?? ?#History of stroke gait instability [2021 s/p TPA-  Ineligible for bevacizumab]-monitor closely on chemotherapy. STABLE.  ? ?#Chronic low back pain-stable unrelated to malignancy.    ? ?# Smoking cessatoin; interested in Chantix; not started [previoulsy tried ]; 3/day-  ?? ?# DISPOSITION:  ?# chemo today; D-3 pump off  ?#  follow up in 2 weeks- MD; labs- cbc/cmp; CEA; FOLFOX + Vectibex chemo; off on D-3; CT CAP-.  Dr.B ?

## 2021-08-02 NOTE — Patient Instructions (Signed)
[facility] CANCER CTR AT [place]  Discharge Instructions: ?Thank you for choosing [facility] to provide your oncology and hematology care.  ?If you have a lab appointment with the Cancer Center, please go directly to the Cancer Center and check in at the registration area. ? ?Wear comfortable clothing and clothing appropriate for easy access to any Portacath or PICC line.  ? ?We strive to give you quality time with your provider. You may need to reschedule your appointment if you arrive late (15 or more minutes).  Arriving late affects you and other patients whose appointments are after yours.  Also, if you miss three or more appointments without notifying the office, you may be dismissed from the clinic at the provider?s discretion.    ?  ?For prescription refill requests, have your pharmacy contact our office and allow 72 hours for refills to be completed.   ? ?  ?To help prevent nausea and vomiting after your treatment, we encourage you to take your nausea medication as directed. ? ?BELOW ARE SYMPTOMS THAT SHOULD BE REPORTED IMMEDIATELY: ?*FEVER GREATER THAN 100.4 F (38 ?C) OR HIGHER ?*CHILLS OR SWEATING ?*NAUSEA AND VOMITING THAT IS NOT CONTROLLED WITH YOUR NAUSEA MEDICATION ?*UNUSUAL SHORTNESS OF BREATH ?*UNUSUAL BRUISING OR BLEEDING ?*URINARY PROBLEMS (pain or burning when urinating, or frequent urination) ?*BOWEL PROBLEMS (unusual diarrhea, constipation, pain near the anus) ?TENDERNESS IN MOUTH AND THROAT WITH OR WITHOUT PRESENCE OF ULCERS (sore throat, sores in mouth, or a toothache) ?UNUSUAL RASH, SWELLING OR PAIN  ?UNUSUAL VAGINAL DISCHARGE OR ITCHING  ? ?Items with * indicate a potential emergency and should be followed up as soon as possible or go to the Emergency Department if any problems should occur. ? ?Please show the CHEMOTHERAPY ALERT CARD or IMMUNOTHERAPY ALERT CARD at check-in to the Emergency Department and triage nurse. ? ?Should you have questions after your visit  or need to cancel or reschedule your appointment, please contact [facility] CANCER CTR AT [place]  [phone] and follow the prompts.  Office hours are 8:00 a.m. to 4:30 p.m. Monday - Friday. Please note that voicemails left after 4:00 p.m. may not be returned until the following business day.  We are closed weekends and major holidays. You have access to a nurse at all times for urgent questions. Please call the main number to the clinic [phone] and follow the prompts. ? ?For any non-urgent questions, you may also contact your provider using MyChart. We now offer e-Visits for anyone 18 and older to request care online for non-urgent symptoms. For details visit [email or web address]. ?  ?Also download the MyChart app! Go to the app store, search "MyChart", open the app, select [place], and log in with your MyChart username and password. ? ?Due to Covid, a mask is required upon entering the hospital/clinic. If you do not have a mask, one will be given to you upon arrival. For doctor visits, patients may have 1 support person aged 18 or older with them. For treatment visits, patients cannot have anyone with them due to current Covid guidelines and our immunocompromised population.  ?

## 2021-08-02 NOTE — Progress Notes (Signed)
[place] ?CONSULT NOTE ? ?Patient Care Team: ?Perrin Maltese, MD as PCP - General (Internal Medicine) ?Kate Sable, MD as PCP - Cardiology (Cardiology) ?Clent Jacks, RN as Oncology Nurse Navigator ? ?CHIEF COMPLAINTS/PURPOSE OF CONSULTATION: COLON CANCER ? ?#  ?Oncology History Overview Note  ? ? ?# [place]oy prior- 5.8 prior ? ?Procedure: Subtotal colectomy  ? ?TUMOR  ?Tumor Site: Descending colon  ?Histologic Type: Adenocarcinoma  ?Histologic Grade: G2, moderately differentiated  ?Tumor Size: Greatest dimension: 4.5 cm  ?Tumor Extent: Invades through the muscularis propria into pericolorectal  ?tissue, see comment  ?Macroscopic Tumor Perforation: Not identified, see comment  ?Lymph-Vascular Invasion: Not identified  ?Perineural Invasion: Present  ?Treatment Effect: No known surgical therapy  ? ?MARGINS  ?Margin Status for Invasive Carcinoma: All margins negative for invasive  ?carcinoma  ?Margins examined: Proximal, distal, mesenteric  ?Margin Status for Non-Invasive Tumor: All margins negative for  ?high-grade dysplasia/intramucosal carcinoma and low-grade dysplasia  ? ?REGIONAL LYMPH NODES  ?Regional Lymph Nodes: All regional lymph nodes negative for tumor  ?Number of lymph nodes examined: 29  ?Tumor Deposits: Not identified  ? ?PATHOLOGIC STAGE CLASSIFICATION (pTNM, AJCC 8th Edition):  ?TNM Descriptors: Not applicable  ?pT3 at least, see comment  ?pN0  ?pM - Not applicable  ? ? ?# JAN [id number] tumor marker [Ct DNA]-CT scan-liver lesion lung nodules/FEB 2023- PET scan-progressive disease. ? ?# MARCH 6th- FOLFOX [bev ineligible-stroke 2021] ? ? ?;# NGS- PD-L1/TPS- 0%.; NGS- K/N-Ras- WLID type  ? ? ? ?# HLP; Acute Stroke [2021s/p TPA;[facility]]; Gout on allopurinol ?  ?Cancer of left colon ([place])  ?11/30/2020 Initial Diagnosis  ? Cancer of left colon [facility]) ?  ?12/29/2020 Cancer Staging  ? Staging form: Colon and Rectum, AJCC 8th Edition ?- Pathologic: Stage IIA (pT3, pN0, cM0) - Signed by  Cammie Sickle, MD on 12/29/2020 ?Total positive nodes: 0 ? ?  ?05/31/2021 Cancer Staging  ? Staging form: Colon and Rectum, AJCC 8th Edition ?- Pathologic: Stage IVB (pTX, pNX, cM1b) - Signed by Cammie Sickle, MD on 05/31/2021 ? ?  ?06/07/2021 -  Chemotherapy  ? Patient is on Treatment Plan : COLORECTAL FOLFOX q14d  ? ?  ?  ? ? ?HISTORY OF PRESENTING ILLNESS: Alone.  Ambulating independently. ? ?Jenna [name] 81 y.o.  female history of recurrent/stage IV descending colon cancer-on FOLFOX chemotherapy is here for follow-up. ? ?Patient complains of shortness of breath on exertion-patient 3 days after chemotherapy.  She is currently back to baseline.  No cough no fever no chills. ? ?Denies any worsening diarrhea.  Denies any worsening tingling numbness. ?  ? ?Review of Systems  ?Constitutional:  Positive for malaise/fatigue. Negative for chills, diaphoresis, fever and weight loss.  ?HENT:  Negative for nosebleeds and sore throat.   ?Eyes:  Negative for double vision.  ?Respiratory:  Negative for cough, hemoptysis, sputum production, shortness of breath and wheezing.   ?Cardiovascular:  Negative for chest pain, palpitations, orthopnea and leg swelling.  ?Gastrointestinal:  Negative for abdominal pain, blood in stool, constipation, diarrhea, heartburn, melena, nausea and vomiting.  ?Genitourinary:  Negative for dysuria, frequency and urgency.  ?Musculoskeletal:  Negative for back pain and joint pain.  ?Skin: Negative.  Negative for itching and rash.  ?Neurological:  Negative for dizziness, tingling, focal weakness, weakness and headaches.  ?Endo/Heme/Allergies:  Does not bruise/bleed easily.  ?Psychiatric/Behavioral:  Negative for depression. The patient is not nervous/anxious and does not have insomnia.   ? ? ?MEDICAL HISTORY:  ?Past Medical  History:  ?Diagnosis Date  ? BRCA negative 03/2020  ? MyRisk neg except MSH3 VUS  ? Colon cancer ([place])   ? Family history of breast cancer   ? IBIS=4.4%/riskscore=2.9%   ? Gout   ? Hyperlipidemia   ? Hypertension   ? Stroke [facility]) 05/2019  ? Thyroid disease   ? ? ?SURGICAL HISTORY: ?Past Surgical History:  ?Procedure Laterality Date  ? APPLICATION OF WOUND VAC  11/16/2020  ? Procedure: APPLICATION OF WOUND VAC;  Surgeon: Fredirick Maudlin, MD;  Location: [facility] ORS;  Service: General;;  Serial # [id number]  ? BREAST BIOPSY Right 1978  ? neg  ? CHOLECYSTECTOMY    ? COLECTOMY WITH COLOSTOMY CREATION/HARTMANN PROCEDURE N/A 11/16/2020  ? Procedure: SUBTOTAL COLECTOMY WITH ILEOSTOMY CREATION;  Surgeon: Fredirick Maudlin, MD;  Location: [facility] ORS;  Service: General;  Laterality: N/A;  ? LAPAROTOMY N/A 11/16/2020  ? Procedure: EXPLORATORY LAPAROTOMY;  Surgeon: Fredirick Maudlin, MD;  Location: [facility] ORS;  Service: General;  Laterality: N/A;  ? PORTACATH PLACEMENT N/A 06/03/2021  ? Procedure: INSERTION PORT-A-CATH;  Surgeon: Jules Husbands, MD;  Location: [facility] ORS;  Service: General;  Laterality: N/A;  Provider requesting 1 hour / 60 minutes for procedure.  ? ? ?SOCIAL HISTORY: ?Social History  ? ?Socioeconomic History  ? Marital status: Married  ?  Spouse name: Not on file  ? Number of children: Not on file  ? Years of education: Not on file  ? Highest education level: Not on file  ?Occupational History  ? Not on file  ?Tobacco Use  ? Smoking status: Every Day  ?  Packs/day: 0.25  ?  Types: Cigarettes  ? Smokeless tobacco: Never  ? Tobacco comments:  ?  smoking 4-5 cigarettes daily.   ?Vaping Use  ? Vaping Use: Never used  ?Substance and Sexual Activity  ? Alcohol use: No  ? Drug use: No  ? Sexual activity: Yes  ?  Birth control/protection: Post-menopausal  ?Other Topics Concern  ? Not on file  ?Social History Narrative  ? 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 Lives with husband in [place].   ? ?Social Determinants of Health  ? ?Financial Resource Strain: Not on file  ?Food Insecurity: Not on file  ?Transportation Needs: Not on file  ?Physical Activity: Not on file  ?Stress: Not on file   ?Social Connections: Not on file  ?Intimate Partner Violence: Not on file  ? ? ?FAMILY HISTORY: ?Family History  ?Problem Relation Age of Onset  ? Breast cancer Maternal Grandmother 28  ? Breast cancer Cousin 31  ? ? ?ALLERGIES:  is allergic to nicotine. ? ?MEDICATIONS:  ?Current Outpatient Medications  ?Medication Sig Dispense Refill  ? allopurinol (ZYLOPRIM) 100 MG tablet Take 100 mg by mouth every evening.    ? aspirin EC 81 MG tablet Take 81 mg by mouth every evening.    ? fenofibrate 54 MG tablet Take 54 mg by mouth every evening.    ? levothyroxine (SYNTHROID) 50 MCG tablet Take 50 mcg by mouth daily.    ? lidocaine-prilocaine (EMLA) cream Apply small amount over port site 2 hours prior to it being accessed and cover with plastic each time port is to be accessed 30 g 1  ? ondansetron (ZOFRAN) 8 MG tablet Take 8 mg by mouth 3 (three) times daily.    ? rosuvastatin (CRESTOR) 40 MG tablet Take 40 mg by mouth every evening.    ? varenicline (CHANTIX) 0.5 MG tablet Take 1 tablet (0.5 mg total) by  mouth 2 (two) times daily. (Patient not taking: Reported on 08/02/2021) 28 tablet 0  ? ?No current facility-administered medications for this visit.  ? ?Facility-Administered Medications Ordered in Other Visits  ?Medication Dose Route Frequency Provider Last Rate Last Admin  ? fluorouracil (ADRUCIL) 4,300 mg in sodium chloride 0.9 % 64 mL chemo infusion  2,400 mg/m2 (Treatment Plan Recorded) Intravenous 1 day or 1 dose Charlaine Dalton R, MD      ? leucovorin 700 mg in dextrose 5 % 250 mL infusion  700 mg Intravenous Once Cammie Sickle, MD 143 mL/hr at 08/02/21 1119 700 mg at 08/02/21 1119  ? oxaliplatin (ELOXATIN) 110 mg in dextrose 5 % 500 mL chemo infusion  60 mg/m2 (Treatment Plan Recorded) Intravenous Once Cammie Sickle, MD 261 mL/hr at 08/02/21 1121 110 mg at 08/02/21 1121  ?  ?. ? ?PHYSICAL EXAMINATION: ?ECOG PERFORMANCE STATUS: 1 - Symptomatic but completely ambulatory ? ?Vitals:  ? 08/02/21 0900   ?BP: 125/74  ?Pulse: 74  ?Resp: 18  ?Temp: (!) 97.3 ?F (36.3 ?C)  ?SpO2: 98%  ? ? ?Filed Weights  ? 08/02/21 0900  ?Weight: 162 lb 12.8 oz (73.8 kg)  ? ? ?Physical Exam ?Vitals and nursing note revi

## 2021-08-03 LAB — CEA: CEA: 3 ng/mL (ref 0.0–4.7)

## 2021-08-04 ENCOUNTER — Inpatient Hospital Stay: Payer: Medicare HMO

## 2021-08-04 VITALS — BP 104/72 | HR 74 | Resp 18

## 2021-08-04 DIAGNOSIS — C186 Malignant neoplasm of descending colon: Secondary | ICD-10-CM

## 2021-08-04 DIAGNOSIS — Z5111 Encounter for antineoplastic chemotherapy: Secondary | ICD-10-CM | POA: Diagnosis not present

## 2021-08-04 DIAGNOSIS — G8929 Other chronic pain: Secondary | ICD-10-CM | POA: Diagnosis not present

## 2021-08-04 DIAGNOSIS — M545 Low back pain, unspecified: Secondary | ICD-10-CM | POA: Diagnosis not present

## 2021-08-04 DIAGNOSIS — I2510 Atherosclerotic heart disease of native coronary artery without angina pectoris: Secondary | ICD-10-CM | POA: Diagnosis not present

## 2021-08-04 DIAGNOSIS — Z8673 Personal history of transient ischemic attack (TIA), and cerebral infarction without residual deficits: Secondary | ICD-10-CM | POA: Diagnosis not present

## 2021-08-04 DIAGNOSIS — D649 Anemia, unspecified: Secondary | ICD-10-CM | POA: Diagnosis not present

## 2021-08-04 DIAGNOSIS — C182 Malignant neoplasm of ascending colon: Secondary | ICD-10-CM | POA: Diagnosis not present

## 2021-08-04 DIAGNOSIS — N2889 Other specified disorders of kidney and ureter: Secondary | ICD-10-CM | POA: Diagnosis not present

## 2021-08-04 MED ORDER — SODIUM CHLORIDE 0.9% FLUSH
10.0000 mL | INTRAVENOUS | Status: DC | PRN
  Administered 2021-08-04: 10 mL
  Filled 2021-08-04: qty 10

## 2021-08-04 MED ORDER — HEPARIN SOD (PORK) LOCK FLUSH 100 UNIT/ML IV SOLN
500.0000 [IU] | Freq: Once | INTRAVENOUS | Status: AC | PRN
  Administered 2021-08-04: 500 [IU]
  Filled 2021-08-04: qty 5

## 2021-08-12 ENCOUNTER — Ambulatory Visit
Admission: RE | Admit: 2021-08-12 | Discharge: 2021-08-12 | Disposition: A | Discharge status: Home / Self Care | Payer: Medicare HMO | Source: Ambulatory Visit | Attending: Internal Medicine | Admitting: Internal Medicine

## 2021-08-12 ENCOUNTER — Ambulatory Visit: Admission: RE | Admit: 2021-08-12 | Payer: Medicare HMO | Source: Ambulatory Visit

## 2021-08-12 ENCOUNTER — Other Ambulatory Visit: Payer: Medicare HMO

## 2021-08-12 ENCOUNTER — Other Ambulatory Visit: Payer: Self-pay

## 2021-08-12 DIAGNOSIS — K435 Parastomal hernia without obstruction or  gangrene: Secondary | ICD-10-CM | POA: Diagnosis not present

## 2021-08-12 DIAGNOSIS — C186 Malignant neoplasm of descending colon: Secondary | ICD-10-CM | POA: Diagnosis not present

## 2021-08-12 DIAGNOSIS — I2510 Atherosclerotic heart disease of native coronary artery without angina pectoris: Secondary | ICD-10-CM | POA: Diagnosis not present

## 2021-08-12 DIAGNOSIS — Z85038 Personal history of other malignant neoplasm of large intestine: Secondary | ICD-10-CM | POA: Diagnosis not present

## 2021-08-12 DIAGNOSIS — K5730 Diverticulosis of large intestine without perforation or abscess without bleeding: Secondary | ICD-10-CM | POA: Diagnosis not present

## 2021-08-12 DIAGNOSIS — J984 Other disorders of lung: Secondary | ICD-10-CM | POA: Diagnosis not present

## 2021-08-12 DIAGNOSIS — C799 Secondary malignant neoplasm of unspecified site: Secondary | ICD-10-CM | POA: Diagnosis not present

## 2021-08-12 MED ORDER — IOHEXOL 300 MG/ML  SOLN
80.0000 mL | Freq: Once | INTRAMUSCULAR | Status: AC | PRN
  Administered 2021-08-12: 80 mL via INTRAVENOUS

## 2021-08-13 MED FILL — Dexamethasone Sodium Phosphate Inj 100 MG/10ML: INTRAMUSCULAR | Qty: 1 | Status: AC

## 2021-08-16 ENCOUNTER — Inpatient Hospital Stay: Payer: Medicare HMO | Admitting: Internal Medicine

## 2021-08-16 ENCOUNTER — Encounter: Payer: Self-pay | Admitting: Internal Medicine

## 2021-08-16 ENCOUNTER — Inpatient Hospital Stay: Payer: Medicare HMO

## 2021-08-16 VITALS — BP 112/72 | HR 73 | Temp 98.3°F | Ht 64.0 in | Wt 161.0 lb

## 2021-08-16 DIAGNOSIS — I2510 Atherosclerotic heart disease of native coronary artery without angina pectoris: Secondary | ICD-10-CM | POA: Diagnosis not present

## 2021-08-16 DIAGNOSIS — N2889 Other specified disorders of kidney and ureter: Secondary | ICD-10-CM | POA: Diagnosis not present

## 2021-08-16 DIAGNOSIS — M545 Low back pain, unspecified: Secondary | ICD-10-CM | POA: Diagnosis not present

## 2021-08-16 DIAGNOSIS — D649 Anemia, unspecified: Secondary | ICD-10-CM | POA: Diagnosis not present

## 2021-08-16 DIAGNOSIS — G8929 Other chronic pain: Secondary | ICD-10-CM | POA: Diagnosis not present

## 2021-08-16 DIAGNOSIS — C186 Malignant neoplasm of descending colon: Secondary | ICD-10-CM | POA: Diagnosis not present

## 2021-08-16 DIAGNOSIS — C182 Malignant neoplasm of ascending colon: Secondary | ICD-10-CM | POA: Diagnosis not present

## 2021-08-16 DIAGNOSIS — Z5111 Encounter for antineoplastic chemotherapy: Secondary | ICD-10-CM | POA: Diagnosis not present

## 2021-08-16 DIAGNOSIS — Z8673 Personal history of transient ischemic attack (TIA), and cerebral infarction without residual deficits: Secondary | ICD-10-CM | POA: Diagnosis not present

## 2021-08-16 LAB — CBC WITH DIFFERENTIAL/PLATELET
Abs Immature Granulocytes: 0.01 10*3/uL (ref 0.00–0.07)
Basophils Absolute: 0 10*3/uL (ref 0.0–0.1)
Basophils Relative: 1 %
Eosinophils Absolute: 0.1 10*3/uL (ref 0.0–0.5)
Eosinophils Relative: 2 %
HCT: 32.1 % — ABNORMAL LOW (ref 36.0–46.0)
Hemoglobin: 10.6 g/dL — ABNORMAL LOW (ref 12.0–15.0)
Immature Granulocytes: 0 %
Lymphocytes Relative: 34 %
Lymphs Abs: 1.5 10*3/uL (ref 0.7–4.0)
MCH: 31.2 pg (ref 26.0–34.0)
MCHC: 33 g/dL (ref 30.0–36.0)
MCV: 94.4 fL (ref 80.0–100.0)
Monocytes Absolute: 0.3 10*3/uL (ref 0.1–1.0)
Monocytes Relative: 8 %
Neutro Abs: 2.5 10*3/uL (ref 1.7–7.7)
Neutrophils Relative %: 55 %
Platelets: 136 10*3/uL — ABNORMAL LOW (ref 150–400)
RBC: 3.4 MIL/uL — ABNORMAL LOW (ref 3.87–5.11)
RDW: 16.4 % — ABNORMAL HIGH (ref 11.5–15.5)
WBC: 4.5 10*3/uL (ref 4.0–10.5)
nRBC: 0 % (ref 0.0–0.2)

## 2021-08-16 LAB — COMPREHENSIVE METABOLIC PANEL
ALT: 12 U/L (ref 0–44)
AST: 20 U/L (ref 15–41)
Albumin: 3.7 g/dL (ref 3.5–5.0)
Alkaline Phosphatase: 39 U/L (ref 38–126)
Anion gap: 7 (ref 5–15)
BUN: 18 mg/dL (ref 8–23)
CO2: 19 mmol/L — ABNORMAL LOW (ref 22–32)
Calcium: 8.8 mg/dL — ABNORMAL LOW (ref 8.9–10.3)
Chloride: 106 mmol/L (ref 98–111)
Creatinine, Ser: 1.04 mg/dL — ABNORMAL HIGH (ref 0.44–1.00)
GFR, Estimated: 55 mL/min — ABNORMAL LOW (ref >60)
Glucose, Bld: 112 mg/dL — ABNORMAL HIGH (ref 70–99)
Potassium: 4.3 mmol/L (ref 3.5–5.1)
Sodium: 132 mmol/L — ABNORMAL LOW (ref 135–145)
Total Bilirubin: 0.4 mg/dL (ref 0.3–1.2)
Total Protein: 6.7 g/dL (ref 6.5–8.1)

## 2021-08-16 LAB — MAGNESIUM: Magnesium: 2.1 mg/dL (ref 1.7–2.4)

## 2021-08-16 MED ORDER — SODIUM CHLORIDE 0.9 % IV SOLN
10.0000 mg | Freq: Once | INTRAVENOUS | Status: AC
  Administered 2021-08-16: 10 mg via INTRAVENOUS
  Filled 2021-08-16: qty 10

## 2021-08-16 MED ORDER — SODIUM CHLORIDE 0.9 % IV SOLN
2400.0000 mg/m2 | INTRAVENOUS | Status: DC
  Administered 2021-08-16: 4300 mg via INTRAVENOUS
  Filled 2021-08-16: qty 86

## 2021-08-16 MED ORDER — SODIUM CHLORIDE 0.9 % IV SOLN
INTRAVENOUS | Status: DC
  Filled 2021-08-16: qty 250

## 2021-08-16 MED ORDER — LEUCOVORIN CALCIUM INJECTION 350 MG
389.0000 mg/m2 | Freq: Once | INTRAVENOUS | Status: AC
  Administered 2021-08-16: 700 mg via INTRAVENOUS
  Filled 2021-08-16: qty 35

## 2021-08-16 NOTE — Progress Notes (Signed)
[place] ?CONSULT NOTE ? ?Patient Care Team: ?Perrin Maltese, MD as PCP - General (Internal Medicine) ?Kate Sable, MD as PCP - Cardiology (Cardiology) ?Clent Jacks, RN as Oncology Nurse Navigator ? ?CHIEF COMPLAINTS/PURPOSE OF CONSULTATION: COLON CANCER ? ?#  ?Oncology History Overview Note  ? ? ?# [place] prior- 5.8 prior ? ?Procedure: Subtotal colectomy  ? ?TUMOR  ?Tumor Site: Descending colon  ?Histologic Type: Adenocarcinoma  ?Histologic Grade: G2, moderately differentiated  ?Tumor Size: Greatest dimension: 4.5 cm  ?Tumor Extent: Invades through the muscularis propria into pericolorectal  ?tissue, see comment  ?Macroscopic Tumor Perforation: Not identified, see comment  ?Lymph-Vascular Invasion: Not identified  ?Perineural Invasion: Present  ?Treatment Effect: No known surgical therapy  ? ?MARGINS  ?Margin Status for Invasive Carcinoma: All margins negative for invasive  ?carcinoma  ?Margins examined: Proximal, distal, mesenteric  ?Margin Status for Non-Invasive Tumor: All margins negative for  ?high-grade dysplasia/intramucosal carcinoma and low-grade dysplasia  ? ?REGIONAL LYMPH NODES  ?Regional Lymph Nodes: All regional lymph nodes negative for tumor  ?Number of lymph nodes examined: 29  ?Tumor Deposits: Not identified  ? ?PATHOLOGIC STAGE CLASSIFICATION (pTNM, AJCC 8th Edition):  ?TNM Descriptors: Not applicable  ?pT3 at least, see comment  ?pN0  ?pM - Not applicable  ? ? ?# JAN [id number] tumor marker [Ct DNA]-CT scan-liver lesion lung nodules/FEB 2023- PET scan-progressive disease. ? ?# MARCH 6th- FOLFOX [bev ineligible-stroke 2021] ? ? ?;# NGS- PD-L1/TPS- 0%.; NGS- K/N-Ras- WLID type  ? ? ? ?# HLP; Acute Stroke [2021s/p TPA;[facility]]; Gout on allopurinol ?  ?Cancer of left colon ([place])  ?11/30/2020 Initial Diagnosis  ? Cancer of left colon [facility]) ?  ?12/29/2020 Cancer Staging  ? Staging form: Colon and Rectum, AJCC 8th Edition ?- Pathologic: Stage IIA (pT3, pN0, cM0) - Signed by  Cammie Sickle, MD on 12/29/2020 ?Total positive nodes: 0 ? ?  ?05/31/2021 Cancer Staging  ? Staging form: Colon and Rectum, AJCC 8th Edition ?- Pathologic: Stage IVB (pTX, pNX, cM1b) - Signed by Cammie Sickle, MD on 05/31/2021 ? ?  ?06/07/2021 -  Chemotherapy  ? Patient is on Treatment Plan : COLORECTAL FOLFOX q14d  ? ?   ? ? ?HISTORY OF PRESENTING ILLNESS: Alone.  Ambulating independently. ? ?Jenna [name] 81 y.o.  female history of recurrent/stage IV descending colon cancer-on FOLFOX chemotherapy is here for follow-up/review results of the CT scan. ? ? ?Patient complains of shortness of breath on exertion-patient 3 days after chemotherapy.  She is currently back to baseline.  No cough no fever no chills. ? ?Denies any worsening diarrhea.  Denies any worsening tingling numbness. ?  ? ?Review of Systems  ?Constitutional:  Positive for malaise/fatigue. Negative for chills, diaphoresis, fever and weight loss.  ?HENT:  Negative for nosebleeds and sore throat.   ?Eyes:  Negative for double vision.  ?Respiratory:  Negative for cough, hemoptysis, sputum production, shortness of breath and wheezing.   ?Cardiovascular:  Negative for chest pain, palpitations, orthopnea and leg swelling.  ?Gastrointestinal:  Negative for abdominal pain, blood in stool, constipation, diarrhea, heartburn, melena, nausea and vomiting.  ?Genitourinary:  Negative for dysuria, frequency and urgency.  ?Musculoskeletal:  Negative for back pain and joint pain.  ?Skin: Negative.  Negative for itching and rash.  ?Neurological:  Negative for dizziness, tingling, focal weakness, weakness and headaches.  ?Endo/Heme/Allergies:  Does not bruise/bleed easily.  ?Psychiatric/Behavioral:  Negative for depression. The patient is not nervous/anxious and does not have insomnia.   ? ? ?  MEDICAL HISTORY:  ?Past Medical History:  ?Diagnosis Date  ? BRCA negative 03/2020  ? MyRisk neg except MSH3 VUS  ? Colon cancer ([place])   ? Family history of breast  cancer   ? IBIS=4.4%/riskscore=2.9%  ? Gout   ? Hyperlipidemia   ? Hypertension   ? Stroke [facility]) 05/2019  ? Thyroid disease   ? ? ?SURGICAL HISTORY: ?Past Surgical History:  ?Procedure Laterality Date  ? APPLICATION OF WOUND VAC  11/16/2020  ? Procedure: APPLICATION OF WOUND VAC;  Surgeon: Fredirick Maudlin, MD;  Location: [facility] ORS;  Service: General;;  Serial # [id number]  ? BREAST BIOPSY Right 1978  ? neg  ? CHOLECYSTECTOMY    ? COLECTOMY WITH COLOSTOMY CREATION/HARTMANN PROCEDURE N/A 11/16/2020  ? Procedure: SUBTOTAL COLECTOMY WITH ILEOSTOMY CREATION;  Surgeon: Fredirick Maudlin, MD;  Location: [facility] ORS;  Service: General;  Laterality: N/A;  ? LAPAROTOMY N/A 11/16/2020  ? Procedure: EXPLORATORY LAPAROTOMY;  Surgeon: Fredirick Maudlin, MD;  Location: [facility] ORS;  Service: General;  Laterality: N/A;  ? PORTACATH PLACEMENT N/A 06/03/2021  ? Procedure: INSERTION PORT-A-CATH;  Surgeon: Jules Husbands, MD;  Location: [facility] ORS;  Service: General;  Laterality: N/A;  Provider requesting 1 hour / 60 minutes for procedure.  ? ? ?SOCIAL HISTORY: ?Social History  ? ?Socioeconomic History  ? Marital status: Married  ?  Spouse name: Not on file  ? Number of children: Not on file  ? Years of education: Not on file  ? Highest education level: Not on file  ?Occupational History  ? Not on file  ?Tobacco Use  ? Smoking status: Every Day  ?  Packs/day: 0.25  ?  Types: Cigarettes  ? Smokeless tobacco: Never  ? Tobacco comments:  ?  smoking 4-5 cigarettes daily.   ?Vaping Use  ? Vaping Use: Never used  ?Substance and Sexual Activity  ? Alcohol use: No  ? Drug use: No  ? Sexual activity: Yes  ?  Birth control/protection: Post-menopausal  ?Other Topics Concern  ? Not on file  ?Social History Narrative  ? 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 Lives with husband in [place].   ? ?Social Determinants of Health  ? ?Financial Resource Strain: Not on file  ?Food Insecurity: Not on file  ?Transportation Needs: Not on file  ?Physical Activity:  Not on file  ?Stress: Not on file  ?Social Connections: Not on file  ?Intimate Partner Violence: Not on file  ? ? ?FAMILY HISTORY: ?Family History  ?Problem Relation Age of Onset  ? Breast cancer Maternal Grandmother 54  ? Breast cancer Cousin 60  ? ? ?ALLERGIES:  is allergic to nicotine. ? ?MEDICATIONS:  ?Current Outpatient Medications  ?Medication Sig Dispense Refill  ? allopurinol (ZYLOPRIM) 100 MG tablet Take 100 mg by mouth every evening.    ? aspirin EC 81 MG tablet Take 81 mg by mouth every evening.    ? fenofibrate 54 MG tablet Take 54 mg by mouth every evening.    ? levothyroxine (SYNTHROID) 50 MCG tablet Take 50 mcg by mouth daily.    ? lidocaine-prilocaine (EMLA) cream Apply small amount over port site 2 hours prior to it being accessed and cover with plastic each time port is to be accessed 30 g 1  ? ondansetron (ZOFRAN) 8 MG tablet Take 8 mg by mouth 3 (three) times daily.    ? rosuvastatin (CRESTOR) 40 MG tablet Take 40 mg by mouth every evening.    ? varenicline (CHANTIX) 0.5 MG tablet Take 1  tablet (0.5 mg total) by mouth 2 (two) times daily. (Patient not taking: Reported on 08/02/2021) 28 tablet 0  ? ?No current facility-administered medications for this visit.  ?  ?. ? ?PHYSICAL EXAMINATION: ?ECOG PERFORMANCE STATUS: 1 - Symptomatic but completely ambulatory ? ?Vitals:  ? 08/16/21 0923  ?BP: 112/72  ?Pulse: 73  ?Temp: 98.3 ?F (36.8 ?C)  ?SpO2: 99%  ? ? ?Filed Weights  ? 08/16/21 0923  ?Weight: 161 lb (73 kg)  ? ? ?Physical Exam ?Vitals and nursing note reviewed.  ?Constitutional:   ?   Appearance: She is not ill-appearing.  ?Eyes:  ?   General: No scleral icterus. ?   Conjunctiva/sclera: Conjunctivae normal.  ?Cardiovascular:  ?   Rate and Rhythm: Normal rate and regular rhythm.  ?Abdominal:  ?   General: There is no distension.  ?   Palpations: Abdomen is soft.  ?   Tenderness: There is no abdominal tenderness. There is no guarding.  ?   Comments: colostomy  ?Musculoskeletal:     ?   General: No  deformity.  ?   Right lower leg: No edema.  ?   Left lower leg: No edema.  ?Lymphadenopathy:  ?   Cervical: No cervical adenopathy.  ?Skin: ?   General: Skin is warm and dry.  ?Neurological:  ?   Mental Sta

## 2021-08-16 NOTE — Progress Notes (Signed)
Scan results. ? ?Having trouble breathing today. ?

## 2021-08-16 NOTE — Patient Instructions (Signed)
[facility] CANCER CTR AT [place]  Discharge Instructions: ?Thank you for choosing [place] [facility] to provide your oncology and hematology care.  ?If you have a lab appointment with the [facility], please go directly to the [facility] and check in at the registration area. ? ?Wear comfortable clothing and clothing appropriate for easy access to any Portacath or PICC line.  ? ?We strive to give you quality time with your provider. You may need to reschedule your appointment if you arrive late (15 or more minutes).  Arriving late affects you and other patients whose appointments are after yours.  Also, if you miss three or more appointments without notifying the office, you may be dismissed from the clinic at the provider?s discretion.    ?  ?For prescription refill requests, have your pharmacy contact our office and allow 72 hours for refills to be completed.   ? ?Today you received the following chemotherapy and/or immunotherapy agents leucovorin, and 5 FU    ?  ?To help prevent nausea and vomiting after your treatment, we encourage you to take your nausea medication as directed. ? ?BELOW ARE SYMPTOMS THAT SHOULD BE REPORTED IMMEDIATELY: ?*FEVER GREATER THAN 100.4 F (38 ?C) OR HIGHER ?*CHILLS OR SWEATING ?*NAUSEA AND VOMITING THAT IS NOT CONTROLLED WITH YOUR NAUSEA MEDICATION ?*UNUSUAL SHORTNESS OF BREATH ?*UNUSUAL BRUISING OR BLEEDING ?*URINARY PROBLEMS (pain or burning when urinating, or frequent urination) ?*BOWEL PROBLEMS (unusual diarrhea, constipation, pain near the anus) ?TENDERNESS IN MOUTH AND THROAT WITH OR WITHOUT PRESENCE OF ULCERS (sore throat, sores in mouth, or a toothache) ?UNUSUAL RASH, SWELLING OR PAIN  ?UNUSUAL VAGINAL DISCHARGE OR ITCHING  ? ?Items with * indicate a potential emergency and should be followed up as soon as possible or go to the Emergency Department if any problems should occur. ? ?Please show the CHEMOTHERAPY ALERT CARD or IMMUNOTHERAPY ALERT CARD at  check-in to the Emergency Department and triage nurse. ? ?Should you have questions after your visit or need to cancel or reschedule your appointment, please contact [facility] CANCER [place] AT [place]  [phone] and follow the prompts.  Office hours are 8:00 a.m. to 4:30 p.m. Monday - Friday. Please note that voicemails left after 4:00 p.m. may not be returned until the following business day.  We are closed weekends and major holidays. You have access to a nurse at all times for urgent questions. Please call the main number to the clinic [phone] and follow the prompts. ? ?For any non-urgent questions, you may also contact your provider using MyChart. We now offer e-Visits for anyone 65 and older to request care online for non-urgent symptoms. For details visit [email or web address]. ?  ?Also download the MyChart app! Go to the app store, search "MyChart", open the app, select [place], and log in with your MyChart username and password. ? ?Due to Covid, a mask is required upon entering the hospital/clinic. If you do not have a mask, one will be given to you upon arrival. For doctor visits, patients may have 1 support person aged 58 or older with them. For treatment visits, patients cannot have anyone with them due to current Covid guidelines and our immunocompromised population.  ? ?Leucovorin injection ?What is this medication? ?LEUCOVORIN (loo koe VOR in) is used to prevent or treat the harmful effects of some medicines. This medicine is used to treat anemia caused by a low amount of folic acid in the body. It is also used with 5-fluorouracil (5-FU) to treat colon cancer. ?  This medicine may be used for other purposes; ask your health care provider or pharmacist if you have questions. ?What should I tell my care team before I take this medication? ?They need to know if you have any of these conditions: ?anemia from low levels of vitamin B-12 in the blood ?an unusual or allergic reaction to  leucovorin, folic acid, other medicines, foods, dyes, or preservatives ?pregnant or trying to get pregnant ?breast-feeding ?How should I use this medication? ?This medicine is for injection into a muscle or into a vein. It is given by a health care professional in a hospital or clinic setting. ?Talk to your pediatrician regarding the use of this medicine in children. Special care may be needed. ?Overdosage: If you think you have taken too much of this medicine contact a poison control center or emergency room at once. ?NOTE: This medicine is only for you. Do not share this medicine with others. ?What if I miss a dose? ?This does not apply. ?What may interact with this medication? ?capecitabine ?fluorouracil ?phenobarbital ?phenytoin ?primidone ?trimethoprim-sulfamethoxazole ?This list may not describe all possible interactions. Give your health care provider a list of all the medicines, herbs, non-prescription drugs, or dietary supplements you use. Also tell them if you smoke, drink alcohol, or use illegal drugs. Some items may interact with your medicine. ?What should I watch for while using this medication? ?Your condition will be monitored carefully while you are receiving this medicine. ?This medicine may increase the side effects of 5-fluorouracil, 5-FU. Tell your doctor or health care professional if you have diarrhea or mouth sores that do not get better or that get worse. ?What side effects may I notice from receiving this medication? ?Side effects that you should report to your doctor or health care professional as soon as possible: ?allergic reactions like skin rash, itching or hives, swelling of the face, lips, or tongue ?breathing problems ?fever, infection ?mouth sores ?unusual bleeding or bruising ?unusually weak or tired ?Side effects that usually do not require medical attention (report to your doctor or health care professional if they continue or are bothersome): ?constipation or diarrhea ?loss of  appetite ?nausea, vomiting ?This list may not describe all possible side effects. Call your doctor for medical advice about side effects. You may report side effects to FDA at 1-800-FDA-1088. ?Where should I keep my medication? ?This drug is given in a hospital or clinic and will not be stored at home. ?NOTE: This sheet is a summary. It may not cover all possible information. If you have questions about this medicine, talk to your doctor, pharmacist, or health care provider. ?? 2023 Elsevier/Gold Standard (2007-09-27 00:00:00) ? ?Fluorouracil, 5-FU injection ?What is this medication? ?FLUOROURACIL, 5-FU (flure oh YOOR a sil) is a chemotherapy drug. It slows the growth of cancer cells. This medicine is used to treat many types of cancer like breast cancer, colon or rectal cancer, pancreatic cancer, and stomach cancer. ?This medicine may be used for other purposes; ask your health care provider or pharmacist if you have questions. ?COMMON BRAND NAME(S): Adrucil ?What should I tell my care team before I take this medication? ?They need to know if you have any of these conditions: ?blood disorders ?dihydropyrimidine dehydrogenase (DPD) deficiency ?infection (especially a virus infection such as chickenpox, cold sores, or herpes) ?kidney disease ?liver disease ?malnourished, poor nutrition ?recent or ongoing radiation therapy ?an unusual or allergic reaction to fluorouracil, other chemotherapy, other medicines, foods, dyes, or preservatives ?pregnant or trying to get pregnant ?breast-feeding ?  How should I use this medication? ?This drug is given as an infusion or injection into a vein. It is administered in a hospital or clinic by a specially trained health care professional. ?Talk to your pediatrician regarding the use of this medicine in children. Special care may be needed. ?Overdosage: If you think you have taken too much of this medicine contact a poison control center or emergency room at once. ?NOTE: This medicine  is only for you. Do not share this medicine with others. ?What if I miss a dose? ?It is important not to miss your dose. Call your doctor or health care professional if you are unable to keep an appoint

## 2021-08-16 NOTE — Assessment & Plan Note (Addendum)
#  Recurrent/stage IV left colon cancer [rising tumor marker/CT DNA]; s/p FOLFOX x 5- MAY 11th, 2023 CT scan- Interval decrease in size of a nodule of the posterior right upper lobe, consistent with treatment response of a small metastasis. Unchanged small nodule of the anterior inferior right upper lobe, which remains equivocal for a second small metastasis. No new nodules.  Interval decrease in size of a lesion of the anterior inferior right lobe of the liver, consistent with treatment response. No evidence of new metastatic disease in the chest, abdomen, or pelvis. MAY 11th, 2023-  NGS-no K-ras/NRAS mutations.;  Plan  vectibex..  ? ?#Proceed with 5FU CIV alone  today.  Labs reviewed today and acceptable for continuation of treatmen discontinue oxaliplatin given fatigue/neuropathy.  Added vectibex; however given this concern for rash patient wants to wait until revision of her ileostomy. ? ?# Dyspnea/fatigue: No clinical concerns for PE.  Suspect worsening fatigue from oxaplipltin  chemotherapy-as above hold oxaliplatin. ? ?# Irritation of the stoma: discussed re: we will consider reversal [Dr.Pabone] as the CAT scan does not show any progressive disease. ?? ?#History of stroke gait instability [2021 s/p TPA-  Ineligible for bevacizumab]-monitor closely on chemotherapy. STABLE.  ? ?#Chronic low back pain-stable unrelated to malignancy.     Stable. ? ?# Smoking cessatoin; interested in Chantix; not started [previoulsy tried ] stable ? ?#Incidental findings on Imaging  CT scan may 2023-Status post subtotal colectomy with right lower quadrant end ?ileostomy. Diverticulosis of the remnant sigmoid colon.  Small right lower quadrant parastomal hernia containing multiple ?loops of terminal ileum.  Coronary artery disease. Aortic Atherosclerosis . I reviewed/discussed/counseled the patient.  ? ?;Vectibex- starting 6/12 ? ?# DISPOSITION:  ?# chemo today; D-3 pump off;HOLD vectibex/oxaliplatin ? ?#  follow up in 2 weeks- MD;  labs- cbc/cmp; Mag; CEA; 5FU; pump off off on D-3; Dr.B ?

## 2021-08-17 ENCOUNTER — Other Ambulatory Visit: Payer: Self-pay | Admitting: Internal Medicine

## 2021-08-17 LAB — CEA: CEA: 3.2 ng/mL (ref 0.0–4.7)

## 2021-08-17 MED ORDER — ALBUTEROL SULFATE HFA 108 (90 BASE) MCG/ACT IN AERS: 2.0000 | INHALATION_SPRAY | Freq: Four times a day (QID) | RESPIRATORY_TRACT | 2 refills | Status: DC | PRN

## 2021-08-17 MED ORDER — FLUTICASONE-SALMETEROL 115-21 MCG/ACT IN AERO: 2.0000 | INHALATION_SPRAY | Freq: Two times a day (BID) | RESPIRATORY_TRACT | 3 refills | Status: DC

## 2021-08-17 NOTE — Progress Notes (Signed)
Called patient regarding her ongoing shortness of breath question underlying COPD.  Recommend albuterol/Advair.  If not improved will refer to pulmonary.  ?

## 2021-08-18 ENCOUNTER — Inpatient Hospital Stay: Payer: Medicare HMO

## 2021-08-18 VITALS — BP 122/67 | HR 72 | Temp 96.9°F | Resp 16

## 2021-08-18 DIAGNOSIS — C186 Malignant neoplasm of descending colon: Secondary | ICD-10-CM

## 2021-08-18 DIAGNOSIS — D649 Anemia, unspecified: Secondary | ICD-10-CM | POA: Diagnosis not present

## 2021-08-18 DIAGNOSIS — Z8673 Personal history of transient ischemic attack (TIA), and cerebral infarction without residual deficits: Secondary | ICD-10-CM | POA: Diagnosis not present

## 2021-08-18 DIAGNOSIS — N2889 Other specified disorders of kidney and ureter: Secondary | ICD-10-CM | POA: Diagnosis not present

## 2021-08-18 DIAGNOSIS — I2510 Atherosclerotic heart disease of native coronary artery without angina pectoris: Secondary | ICD-10-CM | POA: Diagnosis not present

## 2021-08-18 DIAGNOSIS — G8929 Other chronic pain: Secondary | ICD-10-CM | POA: Diagnosis not present

## 2021-08-18 DIAGNOSIS — C182 Malignant neoplasm of ascending colon: Secondary | ICD-10-CM | POA: Diagnosis not present

## 2021-08-18 DIAGNOSIS — M545 Low back pain, unspecified: Secondary | ICD-10-CM | POA: Diagnosis not present

## 2021-08-18 DIAGNOSIS — Z5111 Encounter for antineoplastic chemotherapy: Secondary | ICD-10-CM | POA: Diagnosis not present

## 2021-08-18 MED ORDER — SODIUM CHLORIDE 0.9% FLUSH
10.0000 mL | INTRAVENOUS | Status: DC | PRN
  Administered 2021-08-18: 10 mL
  Filled 2021-08-18: qty 10

## 2021-08-18 MED ORDER — HEPARIN SOD (PORK) LOCK FLUSH 100 UNIT/ML IV SOLN
500.0000 [IU] | Freq: Once | INTRAVENOUS | Status: AC | PRN
  Administered 2021-08-18: 500 [IU]
  Filled 2021-08-18: qty 5

## 2021-08-19 ENCOUNTER — Telehealth: Payer: Self-pay | Admitting: Internal Medicine

## 2021-08-19 NOTE — Telephone Encounter (Signed)
Treatment changed, reached out to pt to r/s time of appt to accomodate scheduling gudelines.Marland KitchenKJ

## 2021-08-23 ENCOUNTER — Ambulatory Visit: Payer: Medicare HMO | Admitting: Surgery

## 2021-08-23 ENCOUNTER — Telehealth: Payer: Self-pay

## 2021-08-23 ENCOUNTER — Encounter: Payer: Self-pay | Admitting: Surgery

## 2021-08-23 ENCOUNTER — Other Ambulatory Visit: Payer: Self-pay

## 2021-08-23 VITALS — BP 125/80 | HR 82 | Temp 98.2°F | Ht 64.5 in | Wt 160.2 lb

## 2021-08-23 DIAGNOSIS — K439 Ventral hernia without obstruction or gangrene: Secondary | ICD-10-CM | POA: Diagnosis not present

## 2021-08-23 DIAGNOSIS — Z932 Ileostomy status: Secondary | ICD-10-CM | POA: Diagnosis not present

## 2021-08-23 DIAGNOSIS — C186 Malignant neoplasm of descending colon: Secondary | ICD-10-CM | POA: Diagnosis not present

## 2021-08-23 DIAGNOSIS — K435 Parastomal hernia without obstruction or  gangrene: Secondary | ICD-10-CM | POA: Diagnosis not present

## 2021-08-23 NOTE — Telephone Encounter (Signed)
Dr. Dahlia Byes put in a urgent referral for Ileostomy in place [facility] and states patient needs a sigmoidoscopy. Does patient needs appointment first or can we just schedule procedure

## 2021-08-23 NOTE — Patient Instructions (Addendum)
Barium Enema scheduled @ [facility] on 08/27/21 @ 10:30 am. [facility] En[facility]. Check in and someone will show where to go.  Referral to [facility]. Someone from their office will call to schedule an appointment. If you do not hear from anyone within 1-2 days please call their office [phone] to schedule an appointment.  Please see your follow up appointment listed below.

## 2021-08-23 NOTE — Telephone Encounter (Signed)
Okay to schedule sigmoidoscopy without being seen in the office first Dx: S/p ileostomy and history of colon cancer, s/p subtotal colectomy  RV

## 2021-08-23 NOTE — Telephone Encounter (Signed)
BE scheduled 08/27/21 @ 11:00 am Jenna [name].   [facility] Referral placed. Patient was provided number and asked to call their office in 2 days to schedule an appointment.  Follow up with Dr.Pabon 09/27/21 @ 10:30.Patient confirmed appointment dates /times.

## 2021-08-23 NOTE — Progress Notes (Unsigned)
Outpatient Surgical Follow Up  08/23/2021  Jenna [name] is an 81 y.o. female.   Chief Complaint  Patient presents with   Follow-up    Discuss ileostomy reversal    HPI: Jenna [name] is a 81 year old female with known history of adenocarcinoma of the descending colon causing large bowel obstruction requiring emergent laparotomy and subtotal colectomy with end ileostomy by Dr. Celine Ahr on August 2022, she did Perforate.  She had 29 nodes that were negative for metastatic disease.  She has been followed by oncology and recent PET/CT is consistent with metastatic disease to the liver.  There is also lung lesion c/w metastasis. She has started chemotherapy with good response.  She had a recent PET/CT that I have personally reviewed showing no evidence of new metastatic fossae and actually the prior baseline fossae are improving.   There is no evidence of any recurrent colorectal cancer.   Ileostomy is working well. She is doing well otherwise, taking po no N/V no abd pain. She is very interested in ileostomy reversal.   Past Medical History:  Diagnosis Date   BRCA negative 03/2020   MyRisk neg except MSH3 VUS   Colon cancer ([place])    Family history of breast cancer    IBIS=4.4%/riskscore=2.9%   Gout    Hyperlipidemia    Hypertension    Stroke ([place]) 05/2019   Thyroid disease     Past Surgical History:  Procedure Laterality Date   APPLICATION OF WOUND VAC  11/16/2020   Procedure: APPLICATION OF WOUND VAC;  Surgeon: Fredirick Maudlin, MD;  Location: [facility];  Service: General;;  Serial # [id number]   BREAST BIOPSY Right 1978   neg   CHOLECYSTECTOMY     COLECTOMY WITH COLOSTOMY CREATION/HARTMANN PROCEDURE N/A 11/16/2020   Procedure: SUBTOTAL COLECTOMY WITH ILEOSTOMY CREATION;  Surgeon: Fredirick Maudlin, MD;  Location: [facility];  Service: General;  Laterality: N/A;   LAPAROTOMY N/A 11/16/2020   Procedure: EXPLORATORY LAPAROTOMY;  Surgeon: Fredirick Maudlin, MD;  Location: [facility];   Service: General;  Laterality: N/A;   PORTACATH PLACEMENT N/A 06/03/2021   Procedure: INSERTION PORT-A-CATH;  Surgeon: Jules Husbands, MD;  Location: [facility];  Service: General;  Laterality: N/A;  Provider requesting 1 hour / 60 minutes for procedure.    Family History  Problem Relation Age of Onset   Breast cancer Maternal Grandmother 79   Breast cancer Cousin 61    Social History:  reports that she has been smoking cigarettes. She has been smoking an average of .25 packs per day. She has never used smokeless tobacco. She reports that she does not drink alcohol and does not use drugs.  Allergies:  Allergies  Allergen Reactions   Nicotine Rash    Medications reviewed.    ROS Full ROS performed and is otherwise negative other than what is stated in HPI   BP 125/80   Pulse 82   Temp 98.2 F (36.8 C) (Oral)   Ht 5' 4.5" (1.638 m)   Wt 160 lb 3.2 oz (72.7 kg)   SpO2 98%   BMI 27.07 kg/m   Physical Exam Vitals and nursing note reviewed. Exam conducted with a chaperone present.  Constitutional:      General: She is not in acute distress.    Appearance: Normal appearance. She is not ill-appearing.  Cardiovascular:     Rate and Rhythm: Normal rate and regular rhythm.     Heart sounds: No murmur heard. Pulmonary:     Effort: Pulmonary effort is  normal. No respiratory distress.     Breath sounds: Normal breath sounds. No stridor.  Abdominal:     General: Abdomen is flat. There is no distension.     Palpations: Abdomen is soft. There is no mass.     Tenderness: There is no guarding or rebound.     Hernia: A hernia is present.     Comments: Right lower quadrant ostomy with good sized parastomal hernia measuring at least 4 cm.  There is also an additional ventral hernia that is about 4 cm in size.  Both of the hernias are reducible.  She does not have any peritonitis  Musculoskeletal:     Cervical back: Normal range of motion and neck supple. No rigidity.  Skin:    General:  Skin is warm and dry.     Capillary Refill: Capillary refill takes less than 2 seconds.  Neurological:     General: No focal deficit present.     Mental Status: She is alert and oriented to person, place, and time.  Psychiatric:        Mood and Affect: Mood normal.        Behavior: Behavior normal.        Thought Content: Thought content normal.        Judgment: Judgment normal.    Assessment/Plan: 81 year old female with stage IV left colon cancer with significant response to chemotherapy.  She has made significant improvement over the last year she does have good cardiovascular reserve.  She is very interested in pursuing ileostomy reversal.  She does have in addition to this a ventral hernia that is incisional and parastomal hernia.  I have discussed with the patient in detail about her disease process.  We will obtain flex sig as well as a barium enema to complete appropriate preoperative work-up and planning.  Do think that she will need an open ileostomy reversal with ventral and parastomal hernia repair.  We may have to do a component repair given the size of the 2 defects.  I spent 40 minutes in this encounter including personally reviewing imaging studies, placing orders, coordinating her care, counseling the patient and performing appropriate documentation  Caroleen Hamman, MD [facility]

## 2021-08-24 ENCOUNTER — Other Ambulatory Visit: Payer: Self-pay

## 2021-08-24 DIAGNOSIS — Z932 Ileostomy status: Secondary | ICD-10-CM

## 2021-08-24 DIAGNOSIS — C186 Malignant neoplasm of descending colon: Secondary | ICD-10-CM

## 2021-08-24 NOTE — Telephone Encounter (Signed)
Got patient schedule for 09/09/2021 went over instructions, sent to [facility] and mailed. She verbalized understanding of results

## 2021-08-27 ENCOUNTER — Other Ambulatory Visit: Payer: Medicare HMO

## 2021-08-27 MED FILL — Dexamethasone Sodium Phosphate Inj 100 MG/10ML: INTRAMUSCULAR | Qty: 1 | Status: AC

## 2021-08-31 ENCOUNTER — Ambulatory Visit: Payer: Medicare HMO

## 2021-08-31 ENCOUNTER — Inpatient Hospital Stay (HOSPITAL_BASED_OUTPATIENT_CLINIC_OR_DEPARTMENT_OTHER): Payer: Medicare HMO | Admitting: Internal Medicine

## 2021-08-31 ENCOUNTER — Encounter: Payer: Self-pay | Admitting: Internal Medicine

## 2021-08-31 ENCOUNTER — Inpatient Hospital Stay: Payer: Medicare HMO

## 2021-08-31 ENCOUNTER — Other Ambulatory Visit: Payer: Medicare HMO

## 2021-08-31 ENCOUNTER — Ambulatory Visit: Payer: Medicare HMO | Admitting: Internal Medicine

## 2021-08-31 VITALS — BP 111/79 | HR 78 | Temp 98.0°F | Ht 64.5 in | Wt 162.8 lb

## 2021-08-31 DIAGNOSIS — N2889 Other specified disorders of kidney and ureter: Secondary | ICD-10-CM | POA: Diagnosis not present

## 2021-08-31 DIAGNOSIS — C186 Malignant neoplasm of descending colon: Secondary | ICD-10-CM | POA: Diagnosis not present

## 2021-08-31 DIAGNOSIS — G8929 Other chronic pain: Secondary | ICD-10-CM | POA: Diagnosis not present

## 2021-08-31 DIAGNOSIS — Z8673 Personal history of transient ischemic attack (TIA), and cerebral infarction without residual deficits: Secondary | ICD-10-CM | POA: Diagnosis not present

## 2021-08-31 DIAGNOSIS — Z5111 Encounter for antineoplastic chemotherapy: Secondary | ICD-10-CM | POA: Diagnosis not present

## 2021-08-31 DIAGNOSIS — M545 Low back pain, unspecified: Secondary | ICD-10-CM | POA: Diagnosis not present

## 2021-08-31 DIAGNOSIS — I2510 Atherosclerotic heart disease of native coronary artery without angina pectoris: Secondary | ICD-10-CM | POA: Diagnosis not present

## 2021-08-31 DIAGNOSIS — D649 Anemia, unspecified: Secondary | ICD-10-CM | POA: Diagnosis not present

## 2021-08-31 DIAGNOSIS — C182 Malignant neoplasm of ascending colon: Secondary | ICD-10-CM | POA: Diagnosis not present

## 2021-08-31 LAB — CBC WITH DIFFERENTIAL/PLATELET
Abs Immature Granulocytes: 0.02 10*3/uL (ref 0.00–0.07)
Basophils Absolute: 0.1 10*3/uL (ref 0.0–0.1)
Basophils Relative: 1 %
Eosinophils Absolute: 0.1 10*3/uL (ref 0.0–0.5)
Eosinophils Relative: 2 %
HCT: 33.2 % — ABNORMAL LOW (ref 36.0–46.0)
Hemoglobin: 11.1 g/dL — ABNORMAL LOW (ref 12.0–15.0)
Immature Granulocytes: 0 %
Lymphocytes Relative: 33 %
Lymphs Abs: 2 10*3/uL (ref 0.7–4.0)
MCH: 32.1 pg (ref 26.0–34.0)
MCHC: 33.4 g/dL (ref 30.0–36.0)
MCV: 96 fL (ref 80.0–100.0)
Monocytes Absolute: 0.5 10*3/uL (ref 0.1–1.0)
Monocytes Relative: 8 %
Neutro Abs: 3.4 10*3/uL (ref 1.7–7.7)
Neutrophils Relative %: 56 %
Platelets: 147 10*3/uL — ABNORMAL LOW (ref 150–400)
RBC: 3.46 MIL/uL — ABNORMAL LOW (ref 3.87–5.11)
RDW: 16.8 % — ABNORMAL HIGH (ref 11.5–15.5)
WBC: 6 10*3/uL (ref 4.0–10.5)
nRBC: 0 % (ref 0.0–0.2)

## 2021-08-31 LAB — COMPREHENSIVE METABOLIC PANEL
ALT: 12 U/L (ref 0–44)
AST: 20 U/L (ref 15–41)
Albumin: 3.8 g/dL (ref 3.5–5.0)
Alkaline Phosphatase: 40 U/L (ref 38–126)
Anion gap: 4 — ABNORMAL LOW (ref 5–15)
BUN: 23 mg/dL (ref 8–23)
CO2: 20 mmol/L — ABNORMAL LOW (ref 22–32)
Calcium: 8.4 mg/dL — ABNORMAL LOW (ref 8.9–10.3)
Chloride: 111 mmol/L (ref 98–111)
Creatinine, Ser: 1 mg/dL (ref 0.44–1.00)
GFR, Estimated: 58 mL/min — ABNORMAL LOW (ref >60)
Glucose, Bld: 108 mg/dL — ABNORMAL HIGH (ref 70–99)
Potassium: 3.9 mmol/L (ref 3.5–5.1)
Sodium: 135 mmol/L (ref 135–145)
Total Bilirubin: 0.5 mg/dL (ref 0.3–1.2)
Total Protein: 6.9 g/dL (ref 6.5–8.1)

## 2021-08-31 LAB — MAGNESIUM: Magnesium: 2.4 mg/dL (ref 1.7–2.4)

## 2021-08-31 MED ORDER — SODIUM CHLORIDE 0.9 % IV SOLN
2400.0000 mg/m2 | INTRAVENOUS | Status: DC
  Administered 2021-08-31: 4300 mg via INTRAVENOUS
  Filled 2021-08-31: qty 86

## 2021-08-31 MED ORDER — SODIUM CHLORIDE 0.9 % IV SOLN
INTRAVENOUS | Status: DC
  Filled 2021-08-31: qty 250

## 2021-08-31 MED ORDER — LEUCOVORIN CALCIUM INJECTION 350 MG
700.0000 mg | Freq: Once | INTRAVENOUS | Status: AC
  Administered 2021-08-31: 700 mg via INTRAVENOUS
  Filled 2021-08-31: qty 35

## 2021-08-31 MED ORDER — SODIUM CHLORIDE 0.9 % IV SOLN
10.0000 mg | Freq: Once | INTRAVENOUS | Status: AC
  Administered 2021-08-31: 10 mg via INTRAVENOUS
  Filled 2021-08-31: qty 10

## 2021-08-31 MED ORDER — SODIUM CHLORIDE 0.9% FLUSH
10.0000 mL | Freq: Once | INTRAVENOUS | Status: AC
  Administered 2021-08-31: 10 mL via INTRAVENOUS
  Filled 2021-08-31: qty 10

## 2021-08-31 MED ORDER — SODIUM CHLORIDE 0.9% FLUSH
10.0000 mL | INTRAVENOUS | Status: DC | PRN
  Filled 2021-08-31: qty 10

## 2021-08-31 NOTE — Assessment & Plan Note (Addendum)
#  Recurrent/stage IV left colon cancer [rising tumor marker/CT DNA]; s/p FOLFOX x 5- MAY 11th, 2023 CT scan- Interval decrease in size of a nodule of the posterior right upper lobe, consistent with treatment response of a small metastasis. Unchanged small nodule of the anterior inferior right upper lobe, which remains equivocal for a second small metastasis. No new nodules.  Interval decrease in size of a lesion of the anterior inferior right lobe of the liver, consistent with treatment response. No evidence of new metastatic disease in the chest, abdomen, or pelvis. MAY 11th, 2023-  NGS-no K-ras/NRAS mutations.;  Plan  Vectibex.    # Proceed with 5FU CIV + LV today. Will plan-to ADD vectibex  [ given risk of rash patient wants to wait until revision of her ileostomy].   # Dyspnea/fatigue: s/p Albuterol prn. STABLE.   #History of stroke gait instability [2021 s/p TPA-  Ineligible for bevacizumab]-monitor closely on chemotherapy. STABLE  #Chronic low back pain-stable unrelated to malignancy- STABLE.   # Smoking cessatoin; interested in Chantix; not started [previoulsy tried ] stable  ;Vectibex- need to add again  # DISPOSITION:  # chemo today; D-3 pump off #  follow up in 2 weeks/Monday- MD; labs- cbc/cmp; Mag; CEA; 25- OH Vit D levels; 5FU + Leucovorin; pump off off on D-3; Dr.B

## 2021-08-31 NOTE — Patient Instructions (Signed)
[facility] CANCER CTR AT [place]  Discharge Instructions: Thank you for choosing [place] to provide your oncology and hematology care.  If you have a lab appointment with the [facility], please go directly to the [facility] and check in at the registration area.  Wear comfortable clothing and clothing appropriate for easy access to any Portacath or PICC line.   We strive to give you quality time with your provider. You may need to reschedule your appointment if you arrive late (15 or more minutes).  Arriving late affects you and other patients whose appointments are after yours.  Also, if you miss three or more appointments without notifying the office, you may be dismissed from the clinic at the provider's discretion.      For prescription refill requests, have your pharmacy contact our office and allow 72 hours for refills to be completed.    Today you received the following chemotherapy and/or immunotherapy agents: Adrucil      To help prevent nausea and vomiting after your treatment, we encourage you to take your nausea medication as directed.  BELOW ARE SYMPTOMS THAT SHOULD BE REPORTED IMMEDIATELY: *FEVER GREATER THAN 100.4 F (38 C) OR HIGHER *CHILLS OR SWEATING *NAUSEA AND VOMITING THAT IS NOT CONTROLLED WITH YOUR NAUSEA MEDICATION *UNUSUAL SHORTNESS OF BREATH *UNUSUAL BRUISING OR BLEEDING *URINARY PROBLEMS (pain or burning when urinating, or frequent urination) *BOWEL PROBLEMS (unusual diarrhea, constipation, pain near the anus) TENDERNESS IN MOUTH AND THROAT WITH OR WITHOUT PRESENCE OF ULCERS (sore throat, sores in mouth, or a toothache) UNUSUAL RASH, SWELLING OR PAIN  UNUSUAL VAGINAL DISCHARGE OR ITCHING   Items with * indicate a potential emergency and should be followed up as soon as possible or go to the Emergency Department if any problems should occur.  Please show the CHEMOTHERAPY ALERT CARD or IMMUNOTHERAPY ALERT CARD at check-in to  the Emergency Department and triage nurse.  Should you have questions after your visit or need to cancel or reschedule your appointment, please contact [facility] CANCER [place] AT [place]  [phone] and follow the prompts.  Office hours are 8:00 a.m. to 4:30 p.m. Monday - Friday. Please note that voicemails left after 4:00 p.m. may not be returned until the following business day.  We are closed weekends and major holidays. You have access to a nurse at all times for urgent questions. Please call the main number to the clinic [phone] and follow the prompts.  For any non-urgent questions, you may also contact your provider using MyChart. We now offer e-Visits for anyone 54 and older to request care online for non-urgent symptoms. For details visit [email or web address].   Also download the MyChart app! Go to the app store, search "MyChart", open the app, select [place], and log in with your MyChart username and password.  Due to Covid, a mask is required upon entering the hospital/clinic. If you do not have a mask, one will be given to you upon arrival. For doctor visits, patients may have 1 support person aged 67 or older with them. For treatment visits, patients cannot have anyone with them due to current Covid guidelines and our immunocompromised population.

## 2021-08-31 NOTE — Progress Notes (Signed)
[facility] NOTE  Patient Care Team: Perrin Maltese, MD as PCP - General (Internal Medicine) Kate Sable, MD as PCP - Cardiology (Cardiology) Clent Jacks, RN as Oncology Nurse Navigator  CHIEF COMPLAINTS/PURPOSE OF CONSULTATION: COLON CANCER  #  Oncology History Overview Note    # [place] prior- 5.8 prior  Procedure: Subtotal colectomy   TUMOR  Tumor Site: Descending colon  Histologic Type: Adenocarcinoma  Histologic Grade: G2, moderately differentiated  Tumor Size: Greatest dimension: 4.5 cm  Tumor Extent: Invades through the muscularis propria into pericolorectal  tissue, see comment  Macroscopic Tumor Perforation: Not identified, see comment  Lymph-Vascular Invasion: Not identified  Perineural Invasion: Present  Treatment Effect: No known surgical therapy   MARGINS  Margin Status for Invasive Carcinoma: All margins negative for invasive  carcinoma  Margins examined: Proximal, distal, mesenteric  Margin Status for Non-Invasive Tumor: All margins negative for  high-grade dysplasia/intramucosal carcinoma and low-grade dysplasia   REGIONAL LYMPH NODES  Regional Lymph Nodes: All regional lymph nodes negative for tumor  Number of lymph nodes examined: 29  Tumor Deposits: Not identified   PATHOLOGIC STAGE CLASSIFICATION (pTNM, AJCC 8th Edition):  TNM Descriptors: Not applicable  pT3 at least, see comment  pN0  pM - Not applicable    # JAN [id number] tumor marker [Ct DNA]-CT scan-liver lesion lung nodules/FEB 2023- PET scan-progressive disease.  # MARCH 6th- FOLFOX [bev ineligible-stroke 2021]   ;# NGS- PD-L1/TPS- 0%.; NGS- K/N-Ras- WLID type     # HLP; Acute Stroke [2021s/p TPA;[facility]]; Gout on allopurinol   Cancer of left colon ([place])  11/30/2020 Initial Diagnosis   Cancer of left colon ([place])   12/29/2020 Cancer Staging   Staging form: Colon and Rectum, AJCC 8th Edition - Pathologic: Stage IIA (pT3, pN0, cM0) - Signed by  Cammie Sickle, MD on 12/29/2020 Total positive nodes: 0    05/31/2021 Cancer Staging   Staging form: Colon and Rectum, AJCC 8th Edition - Pathologic: Stage IVB Cory Munch, pNX, cM1b) - Signed by Cammie Sickle, MD on 05/31/2021    06/07/2021 -  Chemotherapy   Patient is on Treatment Plan : COLORECTAL FOLFOX q14d        HISTORY OF PRESENTING ILLNESS: Alone.  Ambulating independently.  [name] 81 y.o.  female history of recurrent/stage IV descending colon cancer-on 5FU CIV chemotherapy is here for follow-up  In the interim patient was evaluated by surgery for reversal of colostomy.  She is currently awaiting further imaging as ordered by surgery.  Patient shortness of breath is improved since using the albuterol inhaler.  She uses the inhaler as needed.  She is currently back to baseline.  No cough no fever no chills.  Denies any worsening diarrhea.  Denies any worsening tingling numbness.    Review of Systems  Constitutional:  Positive for malaise/fatigue. Negative for chills, diaphoresis, fever and weight loss.  HENT:  Negative for nosebleeds and sore throat.   Eyes:  Negative for double vision.  Respiratory:  Negative for cough, hemoptysis, sputum production, shortness of breath and wheezing.   Cardiovascular:  Negative for chest pain, palpitations, orthopnea and leg swelling.  Gastrointestinal:  Negative for abdominal pain, blood in stool, constipation, diarrhea, heartburn, melena, nausea and vomiting.  Genitourinary:  Negative for dysuria, frequency and urgency.  Musculoskeletal:  Negative for back pain and joint pain.  Skin: Negative.  Negative for itching and rash.  Neurological:  Negative for dizziness, tingling, focal weakness, weakness and headaches.  Endo/Heme/Allergies:  Does not bruise/bleed easily.  Psychiatric/Behavioral:  Negative for depression. The patient is not nervous/anxious and does not have insomnia.     MEDICAL HISTORY:  Past Medical  History:  Diagnosis Date   BRCA negative 03/2020   MyRisk neg except MSH3 VUS   Colon cancer ([place])    Family history of breast cancer    IBIS=4.4%/riskscore=2.9%   Gout    Hyperlipidemia    Hypertension    Stroke ([place]) 05/2019   Thyroid disease     SURGICAL HISTORY: Past Surgical History:  Procedure Laterality Date   APPLICATION OF WOUND VAC  11/16/2020   Procedure: APPLICATION OF WOUND VAC;  Surgeon: Fredirick Maudlin, MD;  Location: [facility];  Service: General;;  Serial # [id number]   BREAST BIOPSY Right 1978   neg   CHOLECYSTECTOMY     COLECTOMY WITH COLOSTOMY CREATION/HARTMANN PROCEDURE N/A 11/16/2020   Procedure: SUBTOTAL COLECTOMY WITH ILEOSTOMY CREATION;  Surgeon: Fredirick Maudlin, MD;  Location: [facility];  Service: General;  Laterality: N/A;   LAPAROTOMY N/A 11/16/2020   Procedure: EXPLORATORY LAPAROTOMY;  Surgeon: Fredirick Maudlin, MD;  Location: [facility];  Service: General;  Laterality: N/A;   PORTACATH PLACEMENT N/A 06/03/2021   Procedure: INSERTION PORT-A-CATH;  Surgeon: Jules Husbands, MD;  Location: [facility];  Service: General;  Laterality: N/A;  Provider requesting 1 hour / 60 minutes for procedure.    SOCIAL HISTORY: Social History   Socioeconomic History   Marital status: Married    Spouse name: Not on file   Number of children: Not on file   Years of education: Not on file   Highest education level: Not on file  Occupational History   Not on file  Tobacco Use   Smoking status: Every Day    Packs/day: 0.25    Types: Cigarettes   Smokeless tobacco: Never   Tobacco comments:    smoking 4-5 cigarettes daily.   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 Lives with husband in [place].    Social Determinants of  Health   Financial Resource Strain: Not on file  Food Insecurity: Not on file  Transportation Needs: Not on file  Physical Activity: Not on file  Stress: Not on file  Social Connections: Not on file  Intimate Partner Violence: Not on file    FAMILY HISTORY: Family History  Problem Relation Age of Onset   Breast cancer Maternal Grandmother 63   Breast cancer Cousin 40    ALLERGIES:  is allergic to nicotine.  MEDICATIONS:  Current Outpatient Medications  Medication Sig Dispense Refill   albuterol (VENTOLIN HFA) 108 (90 Base) MCG/ACT inhaler Inhale 2 puffs into the lungs every 6 (six) hours as needed for wheezing or shortness of breath. 8 g 2   allopurinol (ZYLOPRIM) 100 MG tablet Take 100 mg by mouth every evening.     aspirin EC 81 MG tablet Take 81 mg by mouth every evening.     fluticasone-salmeterol (ADVAIR HFA) 115-21 MCG/ACT inhaler Inhale 2 puffs into the lungs 2 (two) times daily. 1 each 3   levothyroxine (SYNTHROID) 50 MCG tablet Take 50 mcg by mouth daily.     lidocaine-prilocaine (EMLA) cream Apply small amount over port site 2 hours prior  to it being accessed and cover with plastic each time port is to be accessed 30 g 1   rosuvastatin (CRESTOR) 40 MG tablet Take 40 mg by mouth every evening.     No current facility-administered medications for this visit.   Facility-Administered Medications Ordered in Other Visits  Medication Dose Route Frequency Provider Last Rate Last Admin   0.9 %  sodium chloride infusion   Intravenous Continuous Cammie Sickle, MD   Stopped at 08/31/21 1511   fluorouracil (ADRUCIL) 4,300 mg in sodium chloride 0.9 % 64 mL chemo infusion  2,400 mg/m2 (Treatment Plan Recorded) Intravenous 1 day or 1 dose Charlaine Dalton R, MD   4,300 mg at 08/31/21 1512   sodium chloride flush (NS) 0.9 % injection 10 mL  10 mL Intracatheter PRN Cammie Sickle, MD        .  PHYSICAL EXAMINATION: ECOG PERFORMANCE STATUS: 1 - Symptomatic but  completely ambulatory  Vitals:   08/31/21 1302  BP: 111/79  Pulse: 78  Temp: 98 F (36.7 C)  SpO2: 100%    Filed Weights   08/31/21 1302  Weight: 162 lb 12.8 oz (73.8 kg)    Physical Exam Vitals and nursing note reviewed.  Constitutional:      Appearance: She is not ill-appearing.  Eyes:     General: No scleral icterus.    Conjunctiva/sclera: Conjunctivae normal.  Cardiovascular:     Rate and Rhythm: Normal rate and regular rhythm.  Abdominal:     General: There is no distension.     Palpations: Abdomen is soft.     Tenderness: There is no abdominal tenderness. There is no guarding.     Comments: colostomy  Musculoskeletal:        General: No deformity.     Right lower leg: No edema.     Left lower leg: No edema.  Lymphadenopathy:     Cervical: No cervical adenopathy.  Skin:    General: Skin is warm and dry.  Neurological:     Mental Status: She is alert and oriented to person, place, and time. Mental status is at baseline.  Psychiatric:        Mood and Affect: Mood normal.        Behavior: Behavior normal.      LABORATORY DATA:  I have reviewed the data as listed Lab Results  Component Value Date   WBC 6.0 08/31/2021   HGB 11.1 (L) 08/31/2021   HCT 33.2 (L) 08/31/2021   MCV 96.0 08/31/2021   PLT 147 (L) 08/31/2021   Recent Labs    08/02/21 0858 08/16/21 0905 08/31/21 1243  NA 134* 132* 135  K 3.9 4.3 3.9  CL 106 106 111  CO2 20* 19* 20*  GLUCOSE 117* 112* 108*  BUN _0 CREATININE 1.08* 1.04* 1.00  CALCIUM 8.6* 8.8* 8.4*  GFRNONAA 53* 55* 58*  PROT 6.9 6.7 6.9  ALBUMIN 4.0 3.7 3.8  AST _1 ALT _2 ALKPHOS 42 39 40  BILITOT 0.4 0.4 0.5    RADIOGRAPHIC STUDIES: I have personally reviewed the radiological images as listed and agreed with the findings in the report. CT CHEST ABDOMEN PELVIS W CONTRAST  Result Date: 08/14/2021 CLINICAL DATA:  Metastatic colon cancer restaging, status post subtotal colectomy with end  ileostomy. Ongoing chemotherapy * Tracking Code: BO * EXAM: CT CHEST, ABDOMEN, AND PELVIS WITH CONTRAST TECHNIQUE: Multidetector CT imaging of the chest, abdomen and pelvis was performed  following the standard protocol during bolus administration of intravenous contrast. RADIATION DOSE REDUCTION: This exam was performed according to the departmental dose-optimization program which includes automated exposure control, adjustment of the mA and/or kV according to patient size and/or use of iterative reconstruction technique. CONTRAST:  27m OMNIPAQUE IOHEXOL 300 MG/ML SOLN, additional oral enteric contrast COMPARISON:  PET-CT, 05/20/2021, CT chest abdomen pelvis, 05/14/2021 FINDINGS: CT CHEST FINDINGS Cardiovascular: Right chest port catheter. Aortic atherosclerosis. Normal heart size. Left and right coronary artery calcifications no pericardial effusion. Mediastinum/Nodes: No enlarged mediastinal, hilar, or axillary lymph nodes. Thyroid gland, trachea, and esophagus demonstrate no significant findings. Lungs/Pleura: Interval decrease in size of a nodule of the posterior right upper lobe measuring 0.6 x 0.4 cm, previously 0.9 x 0.7 cm (series 3, image 48). Unchanged small nodule of the anterior inferior right upper lobe measuring 0.2 cm (series 3, image 70). Minimal bandlike scarring of the lingula and medial segment right middle lobe (series 3, image 97). Background of very fine centrilobular pulmonary nodules throughout the lungs, most concentrated in the apices. No pleural effusion or pneumothorax. Musculoskeletal: No chest wall mass or suspicious osseous lesions identified. CT ABDOMEN PELVIS FINDINGS Hepatobiliary: Interval decrease in size of a previously noted hypoenhancing lesion of the anterior inferior right lobe of the liver, hepatic segment VI, measuring 0.7 x 0.5 cm, previously 1.1 x 0.8 cm (series 2, image 72). Status post cholecystectomy. No biliary ductal dilatation. Pancreas: Unremarkable. No pancreatic  ductal dilatation or surrounding inflammatory changes. Spleen: Normal in size without significant abnormality. Adrenals/Urinary Tract: Adrenal glands are unremarkable. Kidneys are normal, without renal calculi, solid lesion, or hydronephrosis. Bladder is unremarkable. Stomach/Bowel: Stomach is within normal limits. Status post subtotal colectomy with right lower quadrant end ileostomy. Diverticulosis of the remnant sigmoid colon. No evidence of bowel wall thickening, distention, or inflammatory changes. Vascular/Lymphatic: Aortic atherosclerosis. No enlarged abdominal or pelvic lymph nodes. Reproductive: Status post hysterectomy. Other: Small right lower quadrant parastomal hernia containing multiple loops of terminal ileum (series 2, image 86). No ascites. Musculoskeletal: No acute osseous findings. Status post left hip total arthroplasty. IMPRESSION: 1. Interval decrease in size of a nodule of the posterior right upper lobe, consistent with treatment response of a small metastasis. Unchanged small nodule of the anterior inferior right upper lobe, which remains equivocal for a second small metastasis. No new nodules. Attention on follow-up. 2. Interval decrease in size of a lesion of the anterior inferior right lobe of the liver, consistent with treatment response. 3. No evidence of new metastatic disease in the chest, abdomen, or pelvis. 4. Status post subtotal colectomy with right lower quadrant end ileostomy. Diverticulosis of the remnant sigmoid colon. 5. Small right lower quadrant parastomal hernia containing multiple loops of terminal ileum. 6. Coronary artery disease. Aortic Atherosclerosis (ICD10-I70.0). Electronically Signed   By: ADelanna AhmadiM.D.   On: 08/14/2021 17:33    ASSESSMENT & PLAN:   #Recurrent/stage IV left colon cancer [rising tumor marker/CT DNA] CT scan- 2/11/2-23-right hepatic lobe liver lesion; spiculated nodule of the posterior right upper lobe measuring 0.9 x 0.7 cm, as well as a  smaller nodule of the right upper lobe measuring 0.3 cm, highly suspicious for pulmonary metastases. FEB 2023- PET scan -confirms above findings.   # Labs reviewed today and acceptable for continuation of treatment, FOLFOX (dose reduced - 60 mg/m2). Proceed with cycle 3 today. CEA pending at time of visit- previously, 8.5 Rising. (Prev 6.3). Monitor. Collect signatera testing today for follow up.   # Chemo  induced nausea- reviewed use of antiemetics.   # Anemia- suspect chemo induced. Mild. Monitor.   # Renal insufficiency- Cr 1.06. Mild elevated from her baseline. Encouraged hydration.   # Hypocalcemia- ca 8.6 today. Monitor.    #History of stroke gait instability [2021 s/p TPA-  Ineligible for bevacizumab]-monitor closely on chemotherapy. Continue aspirin.   # NGS- PD-L1/TPS- 0%. Foundation One CDx- MS-stable, TMB - 2. KRAS & NRAS- wildtype; possible use of vectibix or erbitux.    #Chronic low back pain-stable unrelated to malignancy.      #Incidental findings on Imaging-PET scan February 2023: LEFT parotid lesion with marked hyper metabolic; and also hypermetabolic sinonasal lesion [patient s/p previous evaluation work-up Richardson Landry; I discussed with Dr. Richardson Landry appointment in April; coronary arteriosclerosis.   # Port-a-cath- functioning well.    # I reviewed the blood work- with the patient in detail; also reviewed the imaging independently [as summarized above]; and with the patient in detail.     # Goals of care- treatment given with palliative intent.    # DISPOSITION:  # chemo today. # 2 weeks- labs (cbc, cmp, cea), Dr. Garnetta Buddy, FOLFOX chemo, pump off on D3- la  All questions were answered. The patient knows to call the clinic with any problems, questions or concerns.    Cammie Sickle, MD 08/31/2021

## 2021-09-01 LAB — CEA: CEA: 2.7 ng/mL (ref 0.0–4.7)

## 2021-09-02 ENCOUNTER — Inpatient Hospital Stay: Payer: Medicare HMO | Attending: Internal Medicine

## 2021-09-02 VITALS — BP 146/79 | HR 87 | Resp 18

## 2021-09-02 DIAGNOSIS — C7801 Secondary malignant neoplasm of right lung: Secondary | ICD-10-CM | POA: Insufficient documentation

## 2021-09-02 DIAGNOSIS — N289 Disorder of kidney and ureter, unspecified: Secondary | ICD-10-CM | POA: Diagnosis not present

## 2021-09-02 DIAGNOSIS — Z5111 Encounter for antineoplastic chemotherapy: Secondary | ICD-10-CM | POA: Diagnosis not present

## 2021-09-02 DIAGNOSIS — D649 Anemia, unspecified: Secondary | ICD-10-CM | POA: Diagnosis not present

## 2021-09-02 DIAGNOSIS — C186 Malignant neoplasm of descending colon: Secondary | ICD-10-CM | POA: Insufficient documentation

## 2021-09-02 MED ORDER — SODIUM CHLORIDE 0.9% FLUSH
10.0000 mL | INTRAVENOUS | Status: DC | PRN
  Administered 2021-09-02: 10 mL
  Filled 2021-09-02: qty 10

## 2021-09-02 MED ORDER — HEPARIN SOD (PORK) LOCK FLUSH 100 UNIT/ML IV SOLN
500.0000 [IU] | Freq: Once | INTRAVENOUS | Status: AC | PRN
  Administered 2021-09-02: 500 [IU]
  Filled 2021-09-02: qty 5

## 2021-09-06 ENCOUNTER — Ambulatory Visit
Admission: RE | Admit: 2021-09-06 | Discharge: 2021-09-06 | Disposition: A | Discharge status: Home / Self Care | Payer: Medicare HMO | Source: Ambulatory Visit | Attending: Surgery | Admitting: Surgery

## 2021-09-06 DIAGNOSIS — Z932 Ileostomy status: Secondary | ICD-10-CM | POA: Diagnosis not present

## 2021-09-06 DIAGNOSIS — K5730 Diverticulosis of large intestine without perforation or abscess without bleeding: Secondary | ICD-10-CM | POA: Diagnosis not present

## 2021-09-06 DIAGNOSIS — Z01818 Encounter for other preprocedural examination: Secondary | ICD-10-CM | POA: Diagnosis not present

## 2021-09-08 ENCOUNTER — Encounter: Payer: Self-pay | Admitting: Gastroenterology

## 2021-09-09 ENCOUNTER — Encounter: Payer: Self-pay | Admitting: Anesthesiology

## 2021-09-09 ENCOUNTER — Telehealth: Payer: Self-pay

## 2021-09-09 ENCOUNTER — Other Ambulatory Visit: Payer: Self-pay

## 2021-09-09 ENCOUNTER — Encounter
Admission: RE | Disposition: A | Discharge status: Home / Self Care | Payer: Self-pay | Source: Home / Self Care | Attending: Gastroenterology

## 2021-09-09 ENCOUNTER — Ambulatory Visit
Admission: RE | Admit: 2021-09-09 | Discharge: 2021-09-09 | Disposition: A | Discharge status: Home / Self Care | Payer: Medicare HMO | Attending: Gastroenterology | Admitting: Gastroenterology

## 2021-09-09 ENCOUNTER — Encounter: Payer: Self-pay | Admitting: Gastroenterology

## 2021-09-09 DIAGNOSIS — Z932 Ileostomy status: Secondary | ICD-10-CM

## 2021-09-09 DIAGNOSIS — C186 Malignant neoplasm of descending colon: Secondary | ICD-10-CM

## 2021-09-09 DIAGNOSIS — K5730 Diverticulosis of large intestine without perforation or abscess without bleeding: Secondary | ICD-10-CM | POA: Diagnosis not present

## 2021-09-09 DIAGNOSIS — Z01818 Encounter for other preprocedural examination: Secondary | ICD-10-CM | POA: Diagnosis not present

## 2021-09-09 HISTORY — PX: FLEXIBLE SIGMOIDOSCOPY: SHX5431

## 2021-09-09 SURGERY — SIGMOIDOSCOPY, FLEXIBLE
Anesthesia: General

## 2021-09-09 MED ORDER — SODIUM CHLORIDE 0.9 % IV SOLN: INTRAVENOUS | Status: DC

## 2021-09-09 NOTE — Telephone Encounter (Signed)
Got patient schedule for 09/10/2021

## 2021-09-09 NOTE — H&P (Signed)
Cephas Darby, MD [address]  [place]  [place], [place] [address]  Main: [phone]  Fax: [phone] Pager: [phone]  Primary Care Physician:  Perrin Maltese, MD Primary Gastroenterologist:  Dr. Cephas Darby  Pre-Procedure History & Physical: HPI:  Jenna [name] is a 81 y.o. female is here for an flexible sigmoidoscopy.   Past Medical History:  Diagnosis Date   BRCA negative 03/2020   MyRisk neg except MSH3 VUS   Colon cancer [facility])    Family history of breast cancer    IBIS=4.4%/riskscore=2.9%   Gout    Hyperlipidemia    Hypertension    Stroke ([place]) 05/2019   Thyroid disease     Past Surgical History:  Procedure Laterality Date   APPLICATION OF WOUND VAC  11/16/2020   Procedure: APPLICATION OF WOUND VAC;  Surgeon: Fredirick Maudlin, MD;  Location: [facility];  Service: General;;  Serial # [id number]   BREAST BIOPSY Right 1978   neg   CHOLECYSTECTOMY     COLECTOMY WITH COLOSTOMY CREATION/HARTMANN PROCEDURE N/A 11/16/2020   Procedure: SUBTOTAL COLECTOMY WITH ILEOSTOMY CREATION;  Surgeon: Fredirick Maudlin, MD;  Location: [facility];  Service: General;  Laterality: N/A;   LAPAROTOMY N/A 11/16/2020   Procedure: EXPLORATORY LAPAROTOMY;  Surgeon: Fredirick Maudlin, MD;  Location: [facility];  Service: General;  Laterality: N/A;   PORTACATH PLACEMENT N/A 06/03/2021   Procedure: INSERTION PORT-A-CATH;  Surgeon: Jules Husbands, MD;  Location: [facility];  Service: General;  Laterality: N/A;  Provider requesting 1 hour / 60 minutes for procedure.    Prior to Admission medications   Medication Sig Start Date End Date Taking? Authorizing Provider  albuterol (VENTOLIN HFA) 108 (90 Base) MCG/ACT inhaler Inhale 2 puffs into the lungs every 6 (six) hours as needed for wheezing or shortness of breath. 08/17/21  Yes Cammie Sickle, MD  allopurinol (ZYLOPRIM) 100 MG tablet Take 100 mg by mouth every evening.   Yes [provider]  aspirin EC 81 MG tablet Take 81  mg by mouth every evening.   Yes [provider]  fluticasone-salmeterol (ADVAIR HFA) 115-21 MCG/ACT inhaler Inhale 2 puffs into the lungs 2 (two) times daily. 08/17/21  Yes Cammie Sickle, MD  levothyroxine (SYNTHROID) 50 MCG tablet Take 50 mcg by mouth daily. 04/30/19  Yes [provider]  rosuvastatin (CRESTOR) 40 MG tablet Take 40 mg by mouth every evening.   Yes [provider]  lidocaine-prilocaine (EMLA) cream Apply small amount over port site 2 hours prior to it being accessed and cover with plastic each time port is to be accessed 06/03/21   Cammie Sickle, MD    Allergies as of 08/24/2021 - Review Complete 08/24/2021  Allergen Reaction Noted   Nicotine Rash 12/27/2016    Family History  Problem Relation Age of Onset   Breast cancer Maternal Grandmother 70   Breast cancer Cousin 63    Social History   Socioeconomic History   Marital status: Married    Spouse name: Not on file   Number of children: Not on file   Years of education: Not on file   Highest education level: Not on file  Occupational History   Not on file  Tobacco Use   Smoking status: Every Day    Packs/day: 0.25    Types: Cigarettes   Smokeless tobacco: Never   Tobacco comments:    smoking 4-5 cigarettes daily.   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    Social Determinants of Health   Financial Resource Strain: Not on file  Food Insecurity: Not on file  Transportation Needs: Not on file  Physical Activity: Not on file  Stress: Not on file  Social Connections: Not on file  Intimate Partner Violence: Not on file    Review of Systems: See HPI, otherwise negative ROS  Physical  Exam: Ht 5' 4" (1.626 m)   Wt 72.1 kg   BMI 27.29 kg/m  General:   Alert,  pleasant and cooperative in NAD Head:  Normocephalic and atraumatic. Neck:  Supple; no masses or thyromegaly. Lungs:  Clear throughout to auscultation.    Heart:  Regular rate and rhythm. Abdomen:  Soft, nontender and nondistended. Normal bowel sounds, without guarding, and without rebound.   Neurologic:  Alert and  oriented x4;  grossly normal neurologically.  Impression/Plan: Jenna [name] is here for an flexible sigmoidoscopy to be performed for h/o colon cancer, s/p sub total colectomy, end ileostomy  Risks, benefits, limitations, and alternatives regarding  flexible sigmoidoscopy have been reviewed with the patient.  Questions have been answered.  All parties agreeable.   Sherri Sear, MD  09/09/2021, 8:57 AM

## 2021-09-09 NOTE — Op Note (Signed)
[facility] Patient Name: Jenna [name] Procedure Date: 09/09/2021 9:06 AM MRN: [id number] Account #: [id number] Date of Birth: Aug 13, 1940 Admit Type: Outpatient Age: 81 Room: [facility] ENDO ROOM 3 Gender: Female Note Status: Finalized Instrument Name: Upper Endoscope [id number] Procedure:             Flexible Sigmoidoscopy Indications:           Preoperative assessment Providers:             Lin Landsman MD, MD Referring MD:          Perrin Maltese, MD (Referring MD) Medicines:             None Complications:         No immediate complications. Estimated blood loss: None. Procedure:             Pre-Anesthesia Assessment:                        - Prior to the procedure, a History and Physical was                         performed, and patient medications and allergies were                         reviewed. The patient is competent. The risks and                         benefits of the procedure and the sedation options and                         risks were discussed with the patient. All questions                         were answered and informed consent was obtained.                         Patient identification and proposed procedure were                         verified by the physician, the nurse and the                         technician in the pre-procedure area in the procedure                         room in the endoscopy suite. Mental Status                         Examination: alert and oriented. Airway Examination:                         normal oropharyngeal airway and neck mobility.                         Respiratory Examination: clear to auscultation. CV                         Examination: normal. Prophylactic Antibiotics: The  patient does not require prophylactic antibiotics.                         Prior Anticoagulants: The patient has taken no                         previous anticoagulant or  antiplatelet agents. ASA                         Grade Assessment: II - A patient with mild systemic                         disease. After reviewing the risks and benefits, the                         patient was deemed in satisfactory condition to                         undergo the procedure. The anesthesia plan was to use                         no sedation or anesthesia. Immediately prior to                         administration of medications, the patient was                         re-assessed for adequacy to receive sedatives. The                         heart rate, respiratory rate, oxygen saturations,                         blood pressure, adequacy of pulmonary ventilation, and                         response to care were monitored throughout the                         procedure. The physical status of the patient was                         re-assessed after the procedure.                        After obtaining informed consent, the scope was passed                         under direct vision. The Endoscope was introduced                         through the anus and advanced to the the sigmoid                         colon. The flexible sigmoidoscopy was accomplished                         without difficulty. The patient tolerated the  procedure fairly well. The quality of the bowel                         preparation was good. Findings:      The perianal and digital rectal examinations were normal. Pertinent       negatives include normal sphincter tone and no palpable rectal lesions.      The entire examined colon appeared normal, unable to reach the blind end       of the stump due to significant looping, and despite applying pressure,       likely due to presence of hernia.      Multiple small and large-mouthed diverticula were found in the sigmoid       colon. Impression:            - The entire examined colon is normal.                         - Diverticulosis in the sigmoid colon.                        - No specimens collected. Recommendation:        - Discharge patient to home (with spouse).                        - Resume previous diet today.                        - Return to referring physician as previously                         scheduled. Procedure Code(s):     --- Professional ---                        [phone], Sigmoidoscopy, flexible; diagnostic, including                         collection of specimen(s) by brushing or washing, when                         performed (separate procedure) Diagnosis Code(s):     --- Professional ---                        G29.528, Encounter for other preprocedural examination                        K57.30, Diverticulosis of large intestine without                         perforation or abscess without bleeding CPT copyright 2019 American Medical Association. All rights reserved. The codes documented in this report are preliminary and upon coder review may  be revised to meet current compliance requirements. Dr. Ulyess Mort Lin Landsman MD, MD 09/09/2021 9:34:21 AM This report has been signed electronically. Number of Addenda: 0 Note Initiated On: 09/09/2021 9:06 AM Total Procedure Duration: 0 hours 13 minutes 11 seconds  Estimated Blood Loss:  Estimated blood loss: none.      [facility]

## 2021-09-09 NOTE — Telephone Encounter (Signed)
-----   Message from Lin Landsman, MD sent at 09/09/2021  2:04 PM EDT ----- Regarding: Sigmoidoscopy Caryl Pina  The sigmoidoscopy today was incomplete, performed without sedation.  I discussed with Dr. Dahlia Byes, he recommends complete sigmoidoscopy to make sure there is no recurrence of cancer before undergoing surgery.  We need to repeat flexible sigmoidoscopy tomorrow under anesthesia.  I just spoke to patient about my discussion with Dr. Dahlia Byes and she agreed  Thank you  Rohini Vanga

## 2021-09-10 ENCOUNTER — Encounter: Payer: Self-pay | Admitting: Gastroenterology

## 2021-09-10 ENCOUNTER — Encounter
Admission: RE | Disposition: A | Discharge status: Home / Self Care | Payer: Self-pay | Source: Home / Self Care | Attending: Gastroenterology

## 2021-09-10 ENCOUNTER — Ambulatory Visit: Payer: Medicare HMO | Admitting: Certified Registered Nurse Anesthetist

## 2021-09-10 ENCOUNTER — Other Ambulatory Visit: Payer: Self-pay

## 2021-09-10 ENCOUNTER — Ambulatory Visit
Admission: RE | Admit: 2021-09-10 | Discharge: 2021-09-10 | Disposition: A | Discharge status: Home / Self Care | Payer: Medicare HMO | Attending: Gastroenterology | Admitting: Gastroenterology

## 2021-09-10 DIAGNOSIS — Z933 Colostomy status: Secondary | ICD-10-CM

## 2021-09-10 DIAGNOSIS — Z01818 Encounter for other preprocedural examination: Secondary | ICD-10-CM | POA: Diagnosis not present

## 2021-09-10 DIAGNOSIS — K648 Other hemorrhoids: Secondary | ICD-10-CM | POA: Diagnosis not present

## 2021-09-10 DIAGNOSIS — F17210 Nicotine dependence, cigarettes, uncomplicated: Secondary | ICD-10-CM | POA: Insufficient documentation

## 2021-09-10 DIAGNOSIS — Z932 Ileostomy status: Secondary | ICD-10-CM

## 2021-09-10 DIAGNOSIS — K644 Residual hemorrhoidal skin tags: Secondary | ICD-10-CM | POA: Diagnosis not present

## 2021-09-10 DIAGNOSIS — K5730 Diverticulosis of large intestine without perforation or abscess without bleeding: Secondary | ICD-10-CM | POA: Insufficient documentation

## 2021-09-10 DIAGNOSIS — E785 Hyperlipidemia, unspecified: Secondary | ICD-10-CM | POA: Diagnosis not present

## 2021-09-10 HISTORY — PX: FLEXIBLE SIGMOIDOSCOPY: SHX5431

## 2021-09-10 SURGERY — SIGMOIDOSCOPY, FLEXIBLE
Anesthesia: General

## 2021-09-10 MED ORDER — PROPOFOL 500 MG/50ML IV EMUL
INTRAVENOUS | Status: AC
  Filled 2021-09-10: qty 50

## 2021-09-10 MED ORDER — SODIUM CHLORIDE 0.9 % IV SOLN: INTRAVENOUS | Status: DC | PRN

## 2021-09-10 MED ORDER — PROPOFOL 500 MG/50ML IV EMUL
INTRAVENOUS | Status: DC | PRN
  Administered 2021-09-10: 70 ug/kg/min via INTRAVENOUS

## 2021-09-10 MED ORDER — SODIUM CHLORIDE 0.9 % IV SOLN: INTRAVENOUS | Status: DC

## 2021-09-10 MED ORDER — ONDANSETRON HCL 4 MG/2ML IJ SOLN
INTRAMUSCULAR | Status: DC | PRN
  Administered 2021-09-10: 4 mg via INTRAVENOUS

## 2021-09-10 MED FILL — Dexamethasone Sodium Phosphate Inj 100 MG/10ML: INTRAMUSCULAR | Qty: 1 | Status: AC

## 2021-09-10 NOTE — Anesthesia Preprocedure Evaluation (Addendum)
Anesthesia Evaluation  Patient identified by MRN, date of birth, ID band Patient awake    Reviewed: Allergy & Precautions, NPO status , Patient's Chart, lab work & pertinent test results  History of Anesthesia Complications Negative for: history of anesthetic complications  Airway Mallampati: III  TM Distance: >3 FB Neck ROM: Full    Dental  (+) Missing,    Pulmonary neg sleep apnea, neg COPD, Current Smoker and Patient abstained from smoking.,  Pulmonary nodule on CT scan 05/2021   breath sounds clear to auscultation- rhonchi (-) wheezing      Cardiovascular hypertension, Pt. on medications + CAD (on CT scan 05/2021)  (-) Past MI, (-) Cardiac Stents and (-) CABG  Rhythm:Regular Rate:Normal - Systolic murmurs and - Diastolic murmurs    Neuro/Psych neg Seizures CVA, No Residual Symptoms negative psych ROS   GI/Hepatic Right hepatic lobe liver lesion S/P ileostomy   Endo/Other  neg diabetesHypothyroidism   Renal/GU CRFRenal disease     Musculoskeletal negative musculoskeletal ROS (+)   Abdominal (+) - obese,   Peds  Hematology negative hematology ROS (+)   Anesthesia Other Findings 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   Reproductive/Obstetrics                           Anesthesia Physical  Anesthesia Plan  ASA: 3  Anesthesia Plan: General   Post-op Pain Management: Minimal or no pain anticipated   Induction: Intravenous  PONV Risk Score and Plan: 1 and Propofol infusion and TIVA  Airway Management Planned: Natural Airway and Simple Face Mask  Additional Equipment:   Intra-op Plan:   Post-operative Plan:   Informed Consent: I have reviewed the patients History and Physical, chart, labs and discussed the procedure including the risks, benefits and alternatives for the proposed anesthesia with the patient or authorized representative  who has indicated his/her understanding and acceptance.     Dental advisory given  Plan Discussed with: CRNA and Anesthesiologist  Anesthesia Plan Comments:        Anesthesia Quick Evaluation

## 2021-09-10 NOTE — Transfer of Care (Signed)
Immediate Anesthesia Transfer of Care Note  Patient: Jenna [name]  Procedure(s) Performed: FLEXIBLE SIGMOIDOSCOPY  Patient Location: PACU and Endoscopy Unit  Anesthesia Type:General  Level of Consciousness: awake, alert  and oriented  Airway & Oxygen Therapy: Patient Spontanous Breathing  Post-op Assessment: Report given to RN  Post vital signs: Reviewed and stable  Last Vitals:  Vitals Value Taken Time  BP 108/61 09/10/21 1452  Temp    Pulse 74 09/10/21 1452  Resp 25 09/10/21 1452  SpO2 100 % 09/10/21 1452    Last Pain:  Vitals:   09/10/21 1452  TempSrc:   PainSc: 0-No pain         Complications: No notable events documented.

## 2021-09-10 NOTE — H&P (Signed)
Jenna Darby, MD [address]  [place]  [place], [name] [address]  Main: [phone]  Fax: [phone] Pager: [phone]  Primary Care Physician:  Perrin Maltese, MD Primary Gastroenterologist:  Dr. Cephas [name]  Pre-Procedure History & Physical: HPI:  Jenna [name] is a 81 y.o. female is here for an flexible sigmoidoscopy.   Past Medical History:  Diagnosis Date   BRCA negative 03/2020   MyRisk neg except MSH3 VUS   Colon cancer [facility])    Family history of breast cancer    IBIS=4.4%/riskscore=2.9%   Gout    Hyperlipidemia    Hypertension    Stroke ([place]) 05/2019   Thyroid disease     Past Surgical History:  Procedure Laterality Date   APPLICATION OF WOUND VAC  11/16/2020   Procedure: APPLICATION OF WOUND VAC;  Surgeon: Fredirick Maudlin, MD;  Location: [facility];  Service: General;;  Serial # [id number]   BREAST BIOPSY Right 1978   neg   CHOLECYSTECTOMY     COLECTOMY WITH COLOSTOMY CREATION/HARTMANN PROCEDURE N/A 11/16/2020   Procedure: SUBTOTAL COLECTOMY WITH ILEOSTOMY CREATION;  Surgeon: Fredirick Maudlin, MD;  Location: [facility];  Service: General;  Laterality: N/A;   FLEXIBLE SIGMOIDOSCOPY N/A 09/09/2021   Procedure: FLEXIBLE SIGMOIDOSCOPY;  Surgeon: Lin Landsman, MD;  Location: [facility];  Service: Gastroenterology;  Laterality: N/A;   LAPAROTOMY N/A 11/16/2020   Procedure: EXPLORATORY LAPAROTOMY;  Surgeon: Fredirick Maudlin, MD;  Location: [facility];  Service: General;  Laterality: N/A;   PORTACATH PLACEMENT N/A 06/03/2021   Procedure: INSERTION PORT-A-CATH;  Surgeon: Jules Husbands, MD;  Location: [facility];  Service: General;  Laterality: N/A;  Provider requesting 1 hour / 60 minutes for procedure.    Prior to Admission medications   Medication Sig Start Date End Date Taking? Authorizing Provider  levothyroxine (SYNTHROID) 50 MCG tablet Take 50 mcg by mouth daily. 04/30/19  Yes [provider]  albuterol (VENTOLIN HFA) 108 (90 Base)  MCG/ACT inhaler Inhale 2 puffs into the lungs every 6 (six) hours as needed for wheezing or shortness of breath. 08/17/21   Cammie Sickle, MD  allopurinol (ZYLOPRIM) 100 MG tablet Take 100 mg by mouth every evening.    [provider]  aspirin EC 81 MG tablet Take 81 mg by mouth every evening.    [provider]  fluticasone-salmeterol (ADVAIR HFA) 115-21 MCG/ACT inhaler Inhale 2 puffs into the lungs 2 (two) times daily. 08/17/21   Cammie Sickle, MD  lidocaine-prilocaine (EMLA) cream Apply small amount over port site 2 hours prior to it being accessed and cover with plastic each time port is to be accessed 06/03/21   Cammie Sickle, MD  rosuvastatin (CRESTOR) 40 MG tablet Take 40 mg by mouth every evening.    [provider]    Allergies as of 09/09/2021 - Review Complete 09/09/2021  Allergen Reaction Noted   Nicotine Rash 12/27/2016    Family History  Problem Relation Age of Onset   Breast cancer Maternal Grandmother 70   Breast cancer Cousin 71    Social History   Socioeconomic History   Marital status: Married    Spouse name: Not on file   Number of children: Not on file   Years of education: Not on file   Highest education level: Not on file  Occupational History   Not on file  Tobacco Use   Smoking status: Every Day    Packs/day: 0.25    Types: Cigarettes  Smokeless tobacco: Never   Tobacco comments:    smoking 4-5 cigarettes daily.   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 Lives with husband in [place].    Social Determinants of Health   Financial Resource Strain: Not on file  Food Insecurity: Not on file  Transportation Needs: Not on file  Physical Activity: Not on file   Stress: Not on file  Social Connections: Not on file  Intimate Partner Violence: Not on file    Review of Systems: See HPI, otherwise negative ROS  Physical Exam: BP 108/61   Pulse 74   Temp (!) 97.5 F (36.4 C) (Temporal)   Resp (!) 25   Ht _0  (1.626 m)   Wt 68.5 kg   SpO2 100%   BMI 25.92 kg/m  General:   Alert,  pleasant and cooperative in NAD Head:  Normocephalic and atraumatic. Neck:  Supple; no masses or thyromegaly. Lungs:  Clear throughout to auscultation.    Heart:  Regular rate and rhythm. Abdomen:  Soft, nontender and nondistended. Normal bowel sounds, without guarding, and without rebound.   Neurologic:  Alert and  oriented x4;  grossly normal neurologically.  Impression/Plan: Jenna [name] is here for an flexible sigmoidoscopy to be performed for preop evaluation, h/o colon cancer  Risks, benefits, limitations, and alternatives regarding  flexible sigmoidoscopy have been reviewed with the patient.  Questions have been answered.  All parties agreeable.   Sherri Sear, MD  09/10/2021, 2:58 PM

## 2021-09-10 NOTE — Op Note (Signed)
[facility] Patient Name: Jenna [name] Procedure Date: 09/10/2021 2:32 PM MRN: [id number] Account #: [id number] Date of Birth: 02/03/1941 Admit Type: Outpatient Age: 81 Room: [facility] ENDO ROOM 1 Gender: Female Note Status: Finalized Instrument Name: Peds Colonoscope [id number] Procedure:             Flexible Sigmoidoscopy Indications:           Preoperative assessment Providers:             Lin Landsman MD, MD Referring MD:          Perrin Maltese, MD (Referring MD) Medicines:             General Anesthesia Complications:         No immediate complications. Estimated blood loss: None. Procedure:             Pre-Anesthesia Assessment:                        - Prior to the procedure, a History and Physical was                         performed, and patient medications and allergies were                         reviewed. The patient is competent. The risks and                         benefits of the procedure and the sedation options and                         risks were discussed with the patient. All questions                         were answered and informed consent was obtained.                         Patient identification and proposed procedure were                         verified by the physician, the nurse, the                         anesthesiologist, the anesthetist and the technician                         in the pre-procedure area in the procedure room in the                         endoscopy suite. Mental Status Examination: alert and                         oriented. Airway Examination: normal oropharyngeal                         airway and neck mobility. Respiratory Examination:                         clear to auscultation. CV Examination: normal.  Prophylactic Antibiotics: The patient does not require                         prophylactic antibiotics. Prior Anticoagulants: The                          patient has taken no previous anticoagulant or                         antiplatelet agents. ASA Grade Assessment: III - A                         patient with severe systemic disease. After reviewing                         the risks and benefits, the patient was deemed in                         satisfactory condition to undergo the procedure. The                         anesthesia plan was to use general anesthesia.                         Immediately prior to administration of medications,                         the patient was re-assessed for adequacy to receive                         sedatives. The heart rate, respiratory rate, oxygen                         saturations, blood pressure, adequacy of pulmonary                         ventilation, and response to care were monitored                         throughout the procedure. The physical status of the                         patient was re-assessed after the procedure.                        After obtaining informed consent, the scope was passed                         under direct vision. The Colonoscope was introduced                         through the anus and advanced to the the sigmoid                         colon. The flexible sigmoidoscopy was accomplished                         without difficulty. The patient tolerated the  procedure well. The quality of the bowel preparation                         was good. Findings:      The perianal and digital rectal examinations were normal. Pertinent       negatives include normal sphincter tone and no palpable rectal lesions.      Multiple small and large-mouthed diverticula were found in the       recto-sigmoid colon and sigmoid colon. Hartmann's pouch was 20cm long      Non-bleeding external and internal hemorrhoids were found during       retroflexion. The hemorrhoids were large. Impression:            - Diverticulosis in the recto-sigmoid colon and  in the                         sigmoid colon.                        - Non-bleeding external and internal hemorrhoids.                        - No specimens collected. Recommendation:        - Discharge patient to home (with escort).                        - Resume previous diet today.                        - Refer to a surgeon as previously scheduled. Procedure Code(s):     --- Professional ---                        [phone], Sigmoidoscopy, flexible; diagnostic, including                         collection of specimen(s) by brushing or washing, when                         performed (separate procedure) Diagnosis Code(s):     --- Professional ---                        K64.8, Other hemorrhoids                        Z01.818, Encounter for other preprocedural examination                        K57.30, Diverticulosis of large intestine without                         perforation or abscess without bleeding CPT copyright 2019 American Medical Association. All rights reserved. The codes documented in this report are preliminary and upon coder review may  be revised to meet current compliance requirements. Dr. Ulyess Mort Lin Landsman MD, MD 09/10/2021 2:49:07 PM This report has been signed electronically. Number of Addenda: 0 Note Initiated On: 09/10/2021 2:32 PM Total Procedure Duration: 0 hours 5 minutes 47 seconds  Estimated Blood Loss:  Estimated blood loss: none. Estimated blood loss: none.      [place]  [facility]

## 2021-09-10 NOTE — Anesthesia Postprocedure Evaluation (Signed)
Anesthesia Post Note  Patient: Jenna [name]  Procedure(s) Performed: [place]  Patient location during evaluation: Endoscopy Anesthesia Type: General Level of consciousness: awake and alert Pain management: pain level controlled Vital Signs Assessment: post-procedure vital signs reviewed and stable Respiratory status: spontaneous breathing, nonlabored ventilation and respiratory function stable Cardiovascular status: blood pressure returned to baseline and stable Postop Assessment: no apparent nausea or vomiting Anesthetic complications: no   No notable events documented.   Last Vitals:  Vitals:   09/10/21 1502 09/10/21 1511  BP: 110/67 (!) 109/93  Pulse:    Resp: (!) 24   Temp:    SpO2: 100% 100%    Last Pain:  Vitals:   09/10/21 1511  TempSrc:   PainSc: 0-No pain                 [place] Ouch

## 2021-09-13 ENCOUNTER — Inpatient Hospital Stay: Payer: Medicare HMO

## 2021-09-13 ENCOUNTER — Encounter: Payer: Self-pay | Admitting: Internal Medicine

## 2021-09-13 ENCOUNTER — Inpatient Hospital Stay (HOSPITAL_BASED_OUTPATIENT_CLINIC_OR_DEPARTMENT_OTHER): Payer: Medicare HMO | Admitting: Internal Medicine

## 2021-09-13 DIAGNOSIS — D649 Anemia, unspecified: Secondary | ICD-10-CM | POA: Diagnosis not present

## 2021-09-13 DIAGNOSIS — C186 Malignant neoplasm of descending colon: Secondary | ICD-10-CM

## 2021-09-13 DIAGNOSIS — N289 Disorder of kidney and ureter, unspecified: Secondary | ICD-10-CM | POA: Diagnosis not present

## 2021-09-13 DIAGNOSIS — C7801 Secondary malignant neoplasm of right lung: Secondary | ICD-10-CM | POA: Diagnosis not present

## 2021-09-13 DIAGNOSIS — Z5111 Encounter for antineoplastic chemotherapy: Secondary | ICD-10-CM | POA: Diagnosis not present

## 2021-09-13 LAB — MAGNESIUM: Magnesium: 2.3 mg/dL (ref 1.7–2.4)

## 2021-09-13 LAB — CBC WITH DIFFERENTIAL/PLATELET
Abs Immature Granulocytes: 0.02 10*3/uL (ref 0.00–0.07)
Basophils Absolute: 0.1 10*3/uL (ref 0.0–0.1)
Basophils Relative: 1 %
Eosinophils Absolute: 0.2 10*3/uL (ref 0.0–0.5)
Eosinophils Relative: 2 %
HCT: 34.2 % — ABNORMAL LOW (ref 36.0–46.0)
Hemoglobin: 11.4 g/dL — ABNORMAL LOW (ref 12.0–15.0)
Immature Granulocytes: 0 %
Lymphocytes Relative: 29 %
Lymphs Abs: 2.1 10*3/uL (ref 0.7–4.0)
MCH: 32.3 pg (ref 26.0–34.0)
MCHC: 33.3 g/dL (ref 30.0–36.0)
MCV: 96.9 fL (ref 80.0–100.0)
Monocytes Absolute: 0.5 10*3/uL (ref 0.1–1.0)
Monocytes Relative: 7 %
Neutro Abs: 4.5 10*3/uL (ref 1.7–7.7)
Neutrophils Relative %: 61 %
Platelets: 181 10*3/uL (ref 150–400)
RBC: 3.53 MIL/uL — ABNORMAL LOW (ref 3.87–5.11)
RDW: 16.3 % — ABNORMAL HIGH (ref 11.5–15.5)
WBC: 7.3 10*3/uL (ref 4.0–10.5)
nRBC: 0 % (ref 0.0–0.2)

## 2021-09-13 LAB — COMPREHENSIVE METABOLIC PANEL
ALT: 13 U/L (ref 0–44)
AST: 22 U/L (ref 15–41)
Albumin: 3.9 g/dL (ref 3.5–5.0)
Alkaline Phosphatase: 45 U/L (ref 38–126)
Anion gap: 6 (ref 5–15)
BUN: 21 mg/dL (ref 8–23)
CO2: 19 mmol/L — ABNORMAL LOW (ref 22–32)
Calcium: 8.6 mg/dL — ABNORMAL LOW (ref 8.9–10.3)
Chloride: 110 mmol/L (ref 98–111)
Creatinine, Ser: 0.99 mg/dL (ref 0.44–1.00)
GFR, Estimated: 58 mL/min — ABNORMAL LOW (ref >60)
Glucose, Bld: 93 mg/dL (ref 70–99)
Potassium: 4.4 mmol/L (ref 3.5–5.1)
Sodium: 135 mmol/L (ref 135–145)
Total Bilirubin: 0.4 mg/dL (ref 0.3–1.2)
Total Protein: 7.1 g/dL (ref 6.5–8.1)

## 2021-09-13 LAB — VITAMIN D 25 HYDROXY (VIT D DEFICIENCY, FRACTURES): Vit D, 25-Hydroxy: 27.2 ng/mL — ABNORMAL LOW (ref 30–100)

## 2021-09-13 MED ORDER — SODIUM CHLORIDE 0.9 % IV SOLN
2400.0000 mg/m2 | INTRAVENOUS | Status: DC
  Administered 2021-09-13: 4300 mg via INTRAVENOUS
  Filled 2021-09-13: qty 86

## 2021-09-13 MED ORDER — LEUCOVORIN CALCIUM INJECTION 350 MG
700.0000 mg | Freq: Once | INTRAVENOUS | Status: AC
  Administered 2021-09-13: 700 mg via INTRAVENOUS
  Filled 2021-09-13: qty 25

## 2021-09-13 MED ORDER — SODIUM CHLORIDE 0.9 % IV SOLN
10.0000 mg | Freq: Once | INTRAVENOUS | Status: AC
  Administered 2021-09-13: 10 mg via INTRAVENOUS
  Filled 2021-09-13: qty 10

## 2021-09-13 MED ORDER — SODIUM CHLORIDE 0.9 % IV SOLN
INTRAVENOUS | Status: DC | PRN
  Filled 2021-09-13: qty 250

## 2021-09-13 NOTE — Assessment & Plan Note (Addendum)
#  Recurrent/stage IV left colon cancer [rising tumor marker/CT DNA]; s/p FOLFOX x 5- MAY 11th, 2023 CT scan- Interval decrease in size of a nodule of the posterior right upper lobe, consistent with treatment response of a small metastasis. Unchanged small nodule of the anterior inferior right upper lobe, which remains equivocal for a second small metastasis. No new nodules.  Interval decrease in size of a lesion of the anterior inferior right lobe of the liver, consistent with treatment response. No evidence of new metastatic disease in the chest, abdomen, or pelvis. MAY 11th, 2023-  NGS-no K-ras/NRAS mutations.;  Plan ADD Vectibex.    # Proceed with 5FU CIV + LV today. Will plan-to ADD vectibex  [ given risk of rash patient wants to wait until revision of her ileostomy].   # Mucositis- sec to chemo-G-1-2- salt/baking soda rinses.   # Dyspnea/fatigue: s/p Albuterol prn. STABLE.   # Hearing difficulty/left ear-Popping: Recommend Claritin-D twice a day for 5 days  #History of stroke gait instability [2021 s/p TPA-  Ineligible for bevacizumab]-monitor closely on chemotherapy. STABLE  #Chronic low back pain-stable unrelated to malignancy- STABLE.   # Smoking cessatoin; interested in Chantix; not started [previoulsy tried ] stable  ;Vectibex- need to add again  6/26-appt with Dr.P on 6/26 # DISPOSITION:  # chemo today; D-3 pump off #  follow up in 6/27 weeks/- MD; labs- cbc/cmp; Mag; CEA; 5FU + Leucovorin; pump off off on D-3; Dr.B

## 2021-09-13 NOTE — Patient Instructions (Signed)
[facility] CANCER CTR AT [place]  Discharge Instructions: Thank you for choosing [facility] to provide your oncology and hematology care.  If you have a lab appointment with the [facility], please go directly to the [facility] and check in at the registration area.  Wear comfortable clothing and clothing appropriate for easy access to any Portacath or PICC line.   We strive to give you quality time with your provider. You may need to reschedule your appointment if you arrive late (15 or more minutes).  Arriving late affects you and other patients whose appointments are after yours.  Also, if you miss three or more appointments without notifying the office, you may be dismissed from the clinic at the provider's discretion.      For prescription refill requests, have your pharmacy contact our office and allow 72 hours for refills to be completed.    Today you received the following chemotherapy and/or immunotherapy agents:  Leucovorin, 5 FU pump.   To help prevent nausea and vomiting after your treatment, we encourage you to take your nausea medication as directed.  BELOW ARE SYMPTOMS THAT SHOULD BE REPORTED IMMEDIATELY: *FEVER GREATER THAN 100.4 F (38 C) OR HIGHER *CHILLS OR SWEATING *NAUSEA AND VOMITING THAT IS NOT CONTROLLED WITH YOUR NAUSEA MEDICATION *UNUSUAL SHORTNESS OF BREATH *UNUSUAL BRUISING OR BLEEDING *URINARY PROBLEMS (pain or burning when urinating, or frequent urination) *BOWEL PROBLEMS (unusual diarrhea, constipation, pain near the anus) TENDERNESS IN MOUTH AND THROAT WITH OR WITHOUT PRESENCE OF ULCERS (sore throat, sores in mouth, or a toothache) UNUSUAL RASH, SWELLING OR PAIN  UNUSUAL VAGINAL DISCHARGE OR ITCHING   Items with * indicate a potential emergency and should be followed up as soon as possible or go to the Emergency Department if any problems should occur.  Please show the CHEMOTHERAPY ALERT CARD or IMMUNOTHERAPY ALERT CARD at  check-in to the Emergency Department and triage nurse.  Should you have questions after your visit or need to cancel or reschedule your appointment, please contact [facility] CANCER [place] AT [place]  [phone] and follow the prompts.  Office hours are 8:00 a.m. to 4:30 p.m. Monday - Friday. Please note that voicemails left after 4:00 p.m. may not be returned until the following business day.  We are closed weekends and major holidays. You have access to a nurse at all times for urgent questions. Please call the main number to the clinic [phone] and follow the prompts.  For any non-urgent questions, you may also contact your provider using MyChart. We now offer e-Visits for anyone 53 and older to request care online for non-urgent symptoms. For details visit [email or web address].   Also download the MyChart app! Go to the app store, search "MyChart", open the app, select [facility], and log in with your MyChart username and password.  Due to Covid, a mask is required upon entering the hospital/clinic. If you do not have a mask, one will be given to you upon arrival. For doctor visits, patients may have 1 support person aged 60 or older with them. For treatment visits, patients cannot have anyone with them due to current Covid guidelines and our immunocompromised population.

## 2021-09-13 NOTE — Patient Instructions (Signed)
Recommend Claritin-D twice a day for 5 days

## 2021-09-13 NOTE — Progress Notes (Signed)
[facility] NOTE  Patient Care Team: Perrin Maltese, MD as PCP - General (Internal Medicine) Kate Sable, MD as PCP - Cardiology (Cardiology) Clent Jacks, RN as Oncology Nurse Navigator  CHIEF COMPLAINTS/PURPOSE OF CONSULTATION: COLON CANCER  #  Oncology History Overview Note    # [place] prior- 5.8 prior  Procedure: Subtotal colectomy   TUMOR  Tumor Site: Descending colon  Histologic Type: Adenocarcinoma  Histologic Grade: G2, moderately differentiated  Tumor Size: Greatest dimension: 4.5 cm  Tumor Extent: Invades through the muscularis propria into pericolorectal  tissue, see comment  Macroscopic Tumor Perforation: Not identified, see comment  Lymph-Vascular Invasion: Not identified  Perineural Invasion: Present  Treatment Effect: No known surgical therapy   MARGINS  Margin Status for Invasive Carcinoma: All margins negative for invasive  carcinoma  Margins examined: Proximal, distal, mesenteric  Margin Status for Non-Invasive Tumor: All margins negative for  high-grade dysplasia/intramucosal carcinoma and low-grade dysplasia   REGIONAL LYMPH NODES  Regional Lymph Nodes: All regional lymph nodes negative for tumor  Number of lymph nodes examined: 29  Tumor Deposits: Not identified   PATHOLOGIC STAGE CLASSIFICATION (pTNM, AJCC 8th Edition):  TNM Descriptors: Not applicable  pT3 at least, see comment  pN0  pM - Not applicable    # JAN [id number] tumor marker [Ct DNA]-CT scan-liver lesion lung nodules/FEB 2023- PET scan-progressive disease.  # MARCH 6th- FOLFOX [bev ineligible-stroke 2021]   ;# NGS- PD-L1/TPS- 0%.; NGS- K/N-Ras- WLID type     # HLP; Acute Stroke [2021s/p TPA;[facility]]; Gout on allopurinol   Cancer of left colon ([place])  11/30/2020 Initial Diagnosis   Cancer of left colon ([place])   12/29/2020 Cancer Staging   Staging form: Colon and Rectum, AJCC 8th Edition - Pathologic: Stage IIA (pT3, pN0, cM0) - Signed by  Cammie Sickle, MD on 12/29/2020 Total positive nodes: 0   05/31/2021 Cancer Staging   Staging form: Colon and Rectum, AJCC 8th Edition - Pathologic: Stage IVB Cory Munch, pNX, cM1b) - Signed by Cammie Sickle, MD on 05/31/2021   06/07/2021 -  Chemotherapy   Patient is on Treatment Plan : COLORECTAL FOLFOX q14d       HISTORY OF PRESENTING ILLNESS: Alone.  Ambulating independently.  Jenna [name] 81 y.o.  female history of recurrent/stage IV descending colon cancer-on 5FU CIV chemotherapy is here for follow-up  In the interim patient status post barium imaging as ordered by surgery-for consideration of reversal of ileostomy.  Patient shortness of breath is improved since using the albuterol inhaler.  She uses the inhaler as needed.  She is currently back to baseline.  No cough no fever no chills.  Denies any worsening diarrhea.  Denies any worsening tingling numbness.  Complains of left ear difficulty hearing; ear popping.  No discharge no pain   Review of Systems  Constitutional:  Positive for malaise/fatigue. Negative for chills, diaphoresis, fever and weight loss.  HENT:  Negative for nosebleeds and sore throat.   Eyes:  Negative for double vision.  Respiratory:  Negative for cough, hemoptysis, sputum production, shortness of breath and wheezing.   Cardiovascular:  Negative for chest pain, palpitations, orthopnea and leg swelling.  Gastrointestinal:  Negative for abdominal pain, blood in stool, constipation, diarrhea, heartburn, melena, nausea and vomiting.  Genitourinary:  Negative for dysuria, frequency and urgency.  Musculoskeletal:  Negative for back pain and joint pain.  Skin: Negative.  Negative for itching and rash.  Neurological:  Negative for dizziness, tingling, focal weakness, weakness  and headaches.  Endo/Heme/Allergies:  Does not bruise/bleed easily.  Psychiatric/Behavioral:  Negative for depression. The patient is not nervous/anxious and does not have  insomnia.      MEDICAL HISTORY:  Past Medical History:  Diagnosis Date   BRCA negative 03/2020   MyRisk neg except MSH3 VUS   Colon cancer ([place])    Family history of breast cancer    IBIS=4.4%/riskscore=2.9%   Gout    Hyperlipidemia    Hypertension    Stroke ([place]) 05/2019   Thyroid disease     SURGICAL HISTORY: Past Surgical History:  Procedure Laterality Date   APPLICATION OF WOUND VAC  11/16/2020   Procedure: APPLICATION OF WOUND VAC;  Surgeon: Fredirick Maudlin, MD;  Location: [facility];  Service: General;;  Serial # [id number]   BREAST BIOPSY Right 1978   neg   CHOLECYSTECTOMY     COLECTOMY WITH COLOSTOMY CREATION/HARTMANN PROCEDURE N/A 11/16/2020   Procedure: SUBTOTAL COLECTOMY WITH ILEOSTOMY CREATION;  Surgeon: Fredirick Maudlin, MD;  Location: [facility];  Service: General;  Laterality: N/A;   FLEXIBLE SIGMOIDOSCOPY N/A 09/09/2021   Procedure: FLEXIBLE SIGMOIDOSCOPY;  Surgeon: Lin Landsman, MD;  Location: [facility] ENDOSCOPY;  Service: Gastroenterology;  Laterality: N/A;   LAPAROTOMY N/A 11/16/2020   Procedure: EXPLORATORY LAPAROTOMY;  Surgeon: Fredirick Maudlin, MD;  Location: [facility] ORS;  Service: General;  Laterality: N/A;   PORTACATH PLACEMENT N/A 06/03/2021   Procedure: INSERTION PORT-A-CATH;  Surgeon: Jules Husbands, MD;  Location: [facility] ORS;  Service: General;  Laterality: N/A;  Provider requesting 1 hour / 60 minutes for procedure.    SOCIAL HISTORY: Social History   Socioeconomic History   Marital status: Married    Spouse name: Not on file   Number of children: Not on file   Years of education: Not on file   Highest education level: Not on file  Occupational History   Not on file  Tobacco Use   Smoking status: Every Day    Packs/day: 0.25    Types: Cigarettes   Smokeless tobacco: Never   Tobacco comments:    smoking 4-5 cigarettes daily.   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 Lives with husband in [place].    Social Determinants of Health   Financial Resource Strain: Not on file  Food Insecurity: Not on file  Transportation Needs: Not on file  Physical Activity: Not on file  Stress: Not on file  Social Connections: Not on file  Intimate Partner Violence: Not on file    FAMILY HISTORY: Family History  Problem Relation Age of Onset   Breast cancer Maternal Grandmother 4   Breast cancer Cousin 40    ALLERGIES:  is allergic to nicotine.  MEDICATIONS:  Current Outpatient Medications  Medication Sig Dispense Refill   albuterol (VENTOLIN HFA) 108 (90 Base) MCG/ACT inhaler Inhale 2 puffs into the lungs every 6 (six) hours as needed for wheezing or shortness of breath. 8 g 2   allopurinol (ZYLOPRIM) 100 MG tablet Take 100 mg by mouth every evening.     aspirin EC 81 MG tablet Take 81 mg by mouth every evening.     fluticasone-salmeterol (ADVAIR HFA) 115-21 MCG/ACT inhaler Inhale 2 puffs into the lungs 2 (two) times  daily. 1 each 3   levothyroxine (SYNTHROID) 50 MCG tablet Take 50 mcg by mouth daily.     lidocaine-prilocaine (EMLA) cream Apply small amount over port site 2 hours prior to it being accessed and cover with plastic each time port is to be accessed 30 g 1   rosuvastatin (CRESTOR) 40 MG tablet Take 40 mg by mouth every evening.     No current facility-administered medications for this visit.   Facility-Administered Medications Ordered in Other Visits  Medication Dose Route Frequency Provider Last Rate Last Admin   0.9 %  sodium chloride infusion   Intravenous PRN Cammie Sickle, MD       dexamethasone (DECADRON) 10 mg in sodium chloride 0.9 % 50 mL IVPB  10 mg Intravenous Once Cammie Sickle, MD       fluorouracil (ADRUCIL) 4,300 mg in sodium chloride 0.9 % 64  mL chemo infusion  2,400 mg/m2 (Treatment Plan Recorded) Intravenous 1 day or 1 dose Charlaine Dalton R, MD       leucovorin 700 mg in dextrose 5 % 250 mL infusion  700 mg Intravenous Once Charlaine Dalton R, MD        .  PHYSICAL EXAMINATION: ECOG PERFORMANCE STATUS: 1 - Symptomatic but completely ambulatory  Vitals:   09/13/21 0900  BP: 119/79  Pulse: 74  Resp: 16  Temp: (!) 97.4 F (36.3 C)    Filed Weights   09/13/21 0900  Weight: 159 lb (72.1 kg)    Physical Exam Vitals and nursing note reviewed.  Constitutional:      Appearance: She is not ill-appearing.  Eyes:     General: No scleral icterus.    Conjunctiva/sclera: Conjunctivae normal.  Cardiovascular:     Rate and Rhythm: Normal rate and regular rhythm.  Abdominal:     General: There is no distension.     Palpations: Abdomen is soft.     Tenderness: There is no abdominal tenderness. There is no guarding.     Comments: colostomy  Musculoskeletal:        General: No deformity.     Right lower leg: No edema.     Left lower leg: No edema.  Lymphadenopathy:     Cervical: No cervical adenopathy.  Skin:    General: Skin is warm and dry.  Neurological:     Mental Status: She is alert and oriented to person, place, and time. Mental status is at baseline.  Psychiatric:        Mood and Affect: Mood normal.        Behavior: Behavior normal.       LABORATORY DATA:  I have reviewed the data as listed Lab Results  Component Value Date   WBC 7.3 09/13/2021   HGB 11.4 (L) 09/13/2021   HCT 34.2 (L) 09/13/2021   MCV 96.9 09/13/2021   PLT 181 09/13/2021   Recent Labs    08/16/21 0905 08/31/21 1243 09/13/21 0841  NA 132* 135 135  K 4.3 3.9 4.4  CL 106 111 110  CO2 19* 20* 19*  GLUCOSE 112* 108* 93  BUN 18 23 21   CREATININE 1.04* 1.00 0.99  CALCIUM 8.8* 8.4* 8.6*  GFRNONAA 55* 58* 58*  PROT 6.7 6.9 7.1  ALBUMIN 3.7 3.8 3.9  AST 20 20 22   ALT 12 12 13   ALKPHOS 39 40 45  BILITOT 0.4 0.5 0.4     RADIOGRAPHIC STUDIES: I have personally reviewed the radiological images as listed and agreed with the  findings in the report. DG BE (COLON)W SINGLE CM (SOL OR THIN BA)  Result Date: 09/06/2021 CLINICAL DATA:  Preop evaluation for ileostomy reversal EXAM: SINGLE CONTRAST BARIUM ENEMA TECHNIQUE: Initial scout AP supine abdominal image obtained to insure adequate colon cleansing. Barium was introduced into the colon in a retrograde fashion and refluxed from the rectum to the cecum. Spot images of the colon followed by overhead radiographs were obtained. FLUOROSCOPY: Radiation Exposure Index (as provided by the fluoroscopic device): 32.9 mGy COMPARISON:  None Available. FINDINGS: An enema catheter was inserted and retention balloon distended under fluoroscopy. Barium was then introduced with satisfactory demonstration of the rectosigmoid colon with the stump terminating in the pelvis to the left of midline. Sigmoid diverticulosis. No extraluminal contrast. Examination was negative for filling defects to indicate the presence of a polyp or mass lesion. Left hip arthroplasty. IMPRESSION: Rectosigmoid colon with the stump terminating in the pelvis to the left of midline. Sigmoid diverticulosis. Electronically Signed   By: Kathreen Devoid M.D.   On: 09/06/2021 10:53    ASSESSMENT & PLAN:   #Recurrent/stage IV left colon cancer [rising tumor marker/CT DNA] CT scan- 2/11/2-23-right hepatic lobe liver lesion; spiculated nodule of the posterior right upper lobe measuring 0.9 x 0.7 cm, as well as a smaller nodule of the right upper lobe measuring 0.3 cm, highly suspicious for pulmonary metastases. FEB 2023- PET scan -confirms above findings.   # Labs reviewed today and acceptable for continuation of treatment, FOLFOX (dose reduced - 60 mg/m2). Proceed with cycle 3 today. CEA pending at time of visit- previously, 8.5 Rising. (Prev 6.3). Monitor. Collect signatera testing today for follow up.   # Chemo induced  nausea- reviewed use of antiemetics.   # Anemia- suspect chemo induced. Mild. Monitor.   # Renal insufficiency- Cr 1.06. Mild elevated from her baseline. Encouraged hydration.   # Hypocalcemia- ca 8.6 today. Monitor.    #History of stroke gait instability [2021 s/p TPA-  Ineligible for bevacizumab]-monitor closely on chemotherapy. Continue aspirin.   # NGS- PD-L1/TPS- 0%. Foundation One CDx- MS-stable, TMB - 2. KRAS & NRAS- wildtype; possible use of vectibix or erbitux.    #Chronic low back pain-stable unrelated to malignancy.      #Incidental findings on Imaging-PET scan February 2023: LEFT parotid lesion with marked hyper metabolic; and also hypermetabolic sinonasal lesion [patient s/p previous evaluation work-up Richardson Landry; I discussed with Dr. Richardson Landry appointment in April; coronary arteriosclerosis.   # Port-a-cath- functioning well.    # I reviewed the blood work- with the patient in detail; also reviewed the imaging independently [as summarized above]; and with the patient in detail.     # Goals of care- treatment given with palliative intent.    # DISPOSITION:  # chemo today. # 2 weeks- labs (cbc, cmp, cea), Dr. Garnetta Buddy, FOLFOX chemo, pump off on D3- la  All questions were answered. The patient knows to call the clinic with any problems, questions or concerns.    Cammie Sickle, MD 09/13/2021

## 2021-09-13 NOTE — Progress Notes (Signed)
Patient has new hearing loss in left ear that started last week.

## 2021-09-14 ENCOUNTER — Ambulatory Visit: Payer: Medicare HMO

## 2021-09-14 ENCOUNTER — Other Ambulatory Visit: Payer: Medicare HMO

## 2021-09-14 ENCOUNTER — Ambulatory Visit: Payer: Medicare HMO | Admitting: Internal Medicine

## 2021-09-14 LAB — CEA: CEA: 2.9 ng/mL (ref 0.0–4.7)

## 2021-09-15 ENCOUNTER — Inpatient Hospital Stay (HOSPITAL_BASED_OUTPATIENT_CLINIC_OR_DEPARTMENT_OTHER): Payer: Medicare HMO

## 2021-09-15 DIAGNOSIS — Z5111 Encounter for antineoplastic chemotherapy: Secondary | ICD-10-CM | POA: Diagnosis not present

## 2021-09-15 DIAGNOSIS — C186 Malignant neoplasm of descending colon: Secondary | ICD-10-CM | POA: Diagnosis not present

## 2021-09-15 DIAGNOSIS — D649 Anemia, unspecified: Secondary | ICD-10-CM | POA: Diagnosis not present

## 2021-09-15 DIAGNOSIS — C7801 Secondary malignant neoplasm of right lung: Secondary | ICD-10-CM | POA: Diagnosis not present

## 2021-09-15 DIAGNOSIS — N289 Disorder of kidney and ureter, unspecified: Secondary | ICD-10-CM | POA: Diagnosis not present

## 2021-09-15 MED ORDER — SODIUM CHLORIDE 0.9% FLUSH
10.0000 mL | INTRAVENOUS | Status: DC | PRN
  Administered 2021-09-15: 10 mL
  Filled 2021-09-15: qty 10

## 2021-09-15 MED ORDER — HEPARIN SOD (PORK) LOCK FLUSH 100 UNIT/ML IV SOLN
500.0000 [IU] | Freq: Once | INTRAVENOUS | Status: AC | PRN
  Administered 2021-09-15: 500 [IU]
  Filled 2021-09-15: qty 5

## 2021-09-22 DIAGNOSIS — N1831 Chronic kidney disease, stage 3a: Secondary | ICD-10-CM | POA: Diagnosis not present

## 2021-09-27 ENCOUNTER — Ambulatory Visit: Payer: Medicare HMO | Admitting: Surgery

## 2021-09-27 ENCOUNTER — Encounter: Payer: Self-pay | Admitting: Surgery

## 2021-09-27 VITALS — BP 130/78 | HR 82 | Temp 98.5°F | Wt 161.6 lb

## 2021-09-27 DIAGNOSIS — Z932 Ileostomy status: Secondary | ICD-10-CM

## 2021-09-27 DIAGNOSIS — K432 Incisional hernia without obstruction or gangrene: Secondary | ICD-10-CM | POA: Diagnosis not present

## 2021-09-27 MED FILL — Dexamethasone Sodium Phosphate Inj 100 MG/10ML: INTRAMUSCULAR | Qty: 1 | Status: AC

## 2021-09-27 NOTE — H&P (View-Only) (Signed)
Outpatient Surgical Follow Up  09/27/2021  Jenna [name] is an 81 y.o. female.   Chief Complaint  Patient presents with   Follow-up    Discuss Ileostomy reversal     HPI: Jenna [name] is a 81 year old female with known history of adenocarcinoma of the descending colon causing large bowel obstruction requiring emergent laparotomy and subtotal colectomy with end ileostomy by Dr. Celine Ahr on August 2022, she did Perforate.  She had 29 nodes that were negative for metastatic disease.  She has been followed by oncology and recent PET/CT is consistent with metastatic disease to the liver.  There is also lung lesion c/w metastasis. She has started chemotherapy with good response.  She had a recent PET/CT that I have personally reviewed showing no evidence of new metastatic fossae and actually the prior baseline fossae are improving.   There is no evidence of any recurrent colorectal cancer.   Ileostomy is working well. She is doing well otherwise, taking po no N/V no abd pain. She is very interested in ileostomy reversal and recurrent hernia repair. She does have both midline ventral and parastomal hernias. Completed I be as well is completion sigmoidoscopy showing no evidence of residual disease.   Past Medical History:  Diagnosis Date   BRCA negative 03/2020   MyRisk neg except MSH3 VUS   Colon cancer [facility])    Family history of breast cancer    IBIS=4.4%/riskscore=2.9%   Gout    Hyperlipidemia    Hypertension    Stroke ([place]) 05/2019   Thyroid disease     Past Surgical History:  Procedure Laterality Date   APPLICATION OF WOUND VAC  11/16/2020   Procedure: APPLICATION OF WOUND VAC;  Surgeon: Fredirick Maudlin, MD;  Location: [facility];  Service: General;;  Serial # [id number]   BREAST BIOPSY Right 1978   neg   CHOLECYSTECTOMY     COLECTOMY WITH COLOSTOMY CREATION/HARTMANN PROCEDURE N/A 11/16/2020   Procedure: SUBTOTAL COLECTOMY WITH ILEOSTOMY CREATION;  Surgeon: Fredirick Maudlin, MD;   Location: [facility];  Service: General;  Laterality: N/A;   FLEXIBLE SIGMOIDOSCOPY N/A 09/09/2021   Procedure: FLEXIBLE SIGMOIDOSCOPY;  Surgeon: Lin Landsman, MD;  Location: [facility] ENDOSCOPY;  Service: Gastroenterology;  Laterality: N/A;   FLEXIBLE SIGMOIDOSCOPY N/A 09/10/2021   Procedure: FLEXIBLE SIGMOIDOSCOPY;  Surgeon: Lin Landsman, MD;  Location: [facility];  Service: Gastroenterology;  Laterality: N/A;   LAPAROTOMY N/A 11/16/2020   Procedure: EXPLORATORY LAPAROTOMY;  Surgeon: Fredirick Maudlin, MD;  Location: [facility] ORS;  Service: General;  Laterality: N/A;   PORTACATH PLACEMENT N/A 06/03/2021   Procedure: INSERTION PORT-A-CATH;  Surgeon: Jules Husbands, MD;  Location: [facility] ORS;  Service: General;  Laterality: N/A;  Provider requesting 1 hour / 60 minutes for procedure.    Family History  Problem Relation Age of Onset   Breast cancer Maternal Grandmother 107   Breast cancer Cousin 74    Social History:  reports that she has been smoking cigarettes. She has been smoking an average of .25 packs per day. She has never used smokeless tobacco. She reports that she does not drink alcohol and does not use drugs.  Allergies: No Active Allergies  Medications reviewed.    ROS Full ROS performed and is otherwise negative other than what is stated in HPI   BP 130/78   Pulse 82   Temp 98.5 F (36.9 C) (Oral)   Wt 161 lb 9.6 oz (73.3 kg)   SpO2 98%   BMI 27.74 kg/m   Physical Exam  Vitals and nursing note reviewed. Exam conducted with a chaperone present.  Constitutional:      General: She is not in acute distress.    Appearance: Normal appearance. She is not ill-appearing or toxic-appearing.  Eyes:     General: No scleral icterus.       Right eye: No discharge.        Left eye: No discharge.  Cardiovascular:     Rate and Rhythm: Normal rate and regular rhythm.     Heart sounds: No murmur heard.    No gallop.  Pulmonary:     Effort: Pulmonary effort is normal. No  respiratory distress.     Breath sounds: No stridor. No wheezing.  Abdominal:     General: Abdomen is flat. There is no distension.     Palpations: Abdomen is soft. There is no mass.     Tenderness: There is no guarding or rebound.     Hernia: A hernia is present.     Comments: Both parastomal herni measuring 5 cms and another ventral hernia that is recurrent swiss cheese defect encompassing an additional 5cms  Musculoskeletal:        General: No swelling or tenderness. Normal range of motion.     Cervical back: Normal range of motion and neck supple. No rigidity or tenderness.  Skin:    General: Skin is warm and dry.     Capillary Refill: Capillary refill takes less than 2 seconds.     Coloration: Skin is not jaundiced.  Neurological:     General: No focal deficit present.     Mental Status: She is alert and oriented to person, place, and time.  Psychiatric:        Mood and Affect: Mood normal.        Behavior: Behavior normal.        Thought Content: Thought content normal.        Judgment: Judgment normal.    Assessment/Plan: 81 year old female with stage IV left colon cancer with significant response to chemotherapy.  She has made significant improvement over the last year she does have good cardiovascular reserve.  She wishes ileostomy reversal and recurrent hernia repair.  I have discussed with the patient in detail about her disease process.  I  Do think that she will need an open ileostomy reversal with ventral and parastomal hernia repair. We may have to do a component repair given the size of the 2 defects. I am planning to place her supine and doing EEA [place]-Baker anastomosis if possible , giving [place] mobility of the abdominal wall and appropriate anastomosis  Procedure d/w the pt in detail.  Risks, benefits and possible complications But not limited to: Bleeding, section, hernia recurrence, bowel injuries, fistula ,the need for another ostomy.  She understands and wishes  to proceed I spent 45 minutes in this encounter including personally reviewing imaging studies, placing orders, coordinating her care, counseling the patient and performing appropriate documentation   Caroleen Hamman, MD [facility] Surgeon

## 2021-09-28 ENCOUNTER — Inpatient Hospital Stay: Payer: Medicare HMO

## 2021-09-28 ENCOUNTER — Inpatient Hospital Stay: Payer: Medicare HMO | Admitting: Internal Medicine

## 2021-09-28 ENCOUNTER — Encounter: Payer: Self-pay | Admitting: Internal Medicine

## 2021-09-28 DIAGNOSIS — D649 Anemia, unspecified: Secondary | ICD-10-CM | POA: Diagnosis not present

## 2021-09-28 DIAGNOSIS — C186 Malignant neoplasm of descending colon: Secondary | ICD-10-CM

## 2021-09-28 DIAGNOSIS — N289 Disorder of kidney and ureter, unspecified: Secondary | ICD-10-CM | POA: Diagnosis not present

## 2021-09-28 DIAGNOSIS — Z5111 Encounter for antineoplastic chemotherapy: Secondary | ICD-10-CM | POA: Diagnosis not present

## 2021-09-28 DIAGNOSIS — C7801 Secondary malignant neoplasm of right lung: Secondary | ICD-10-CM | POA: Diagnosis not present

## 2021-09-28 LAB — MAGNESIUM: Magnesium: 2.4 mg/dL (ref 1.7–2.4)

## 2021-09-28 LAB — COMPREHENSIVE METABOLIC PANEL
ALT: 14 U/L (ref 0–44)
AST: 23 U/L (ref 15–41)
Albumin: 3.9 g/dL (ref 3.5–5.0)
Alkaline Phosphatase: 41 U/L (ref 38–126)
Anion gap: 8 (ref 5–15)
BUN: 21 mg/dL (ref 8–23)
CO2: 21 mmol/L — ABNORMAL LOW (ref 22–32)
Calcium: 8.8 mg/dL — ABNORMAL LOW (ref 8.9–10.3)
Chloride: 107 mmol/L (ref 98–111)
Creatinine, Ser: 1.06 mg/dL — ABNORMAL HIGH (ref 0.44–1.00)
GFR, Estimated: 54 mL/min — ABNORMAL LOW (ref >60)
Glucose, Bld: 101 mg/dL — ABNORMAL HIGH (ref 70–99)
Potassium: 4.2 mmol/L (ref 3.5–5.1)
Sodium: 136 mmol/L (ref 135–145)
Total Bilirubin: 0.3 mg/dL (ref 0.3–1.2)
Total Protein: 6.9 g/dL (ref 6.5–8.1)

## 2021-09-28 LAB — CBC WITH DIFFERENTIAL/PLATELET
Abs Immature Granulocytes: 0.03 10*3/uL (ref 0.00–0.07)
Basophils Absolute: 0.1 10*3/uL (ref 0.0–0.1)
Basophils Relative: 1 %
Eosinophils Absolute: 0.2 10*3/uL (ref 0.0–0.5)
Eosinophils Relative: 3 %
HCT: 33.1 % — ABNORMAL LOW (ref 36.0–46.0)
Hemoglobin: 10.8 g/dL — ABNORMAL LOW (ref 12.0–15.0)
Immature Granulocytes: 1 %
Lymphocytes Relative: 29 %
Lymphs Abs: 1.8 10*3/uL (ref 0.7–4.0)
MCH: 32.4 pg (ref 26.0–34.0)
MCHC: 32.6 g/dL (ref 30.0–36.0)
MCV: 99.4 fL (ref 80.0–100.0)
Monocytes Absolute: 0.5 10*3/uL (ref 0.1–1.0)
Monocytes Relative: 7 %
Neutro Abs: 3.6 10*3/uL (ref 1.7–7.7)
Neutrophils Relative %: 59 %
Platelets: 187 10*3/uL (ref 150–400)
RBC: 3.33 MIL/uL — ABNORMAL LOW (ref 3.87–5.11)
RDW: 16 % — ABNORMAL HIGH (ref 11.5–15.5)
WBC: 6.2 10*3/uL (ref 4.0–10.5)
nRBC: 0 % (ref 0.0–0.2)

## 2021-09-28 MED ORDER — SODIUM CHLORIDE 0.9 % IV SOLN
2400.0000 mg/m2 | INTRAVENOUS | Status: DC
  Administered 2021-09-28: 4300 mg via INTRAVENOUS
  Filled 2021-09-28: qty 86

## 2021-09-28 MED ORDER — LEUCOVORIN CALCIUM INJECTION 350 MG
389.0000 mg/m2 | Freq: Once | INTRAVENOUS | Status: AC
  Administered 2021-09-28: 700 mg via INTRAVENOUS
  Filled 2021-09-28: qty 35

## 2021-09-28 MED ORDER — SODIUM CHLORIDE 0.9% FLUSH
10.0000 mL | Freq: Once | INTRAVENOUS | Status: AC
  Administered 2021-09-28: 10 mL via INTRAVENOUS
  Filled 2021-09-28: qty 10

## 2021-09-28 MED ORDER — HEPARIN SOD (PORK) LOCK FLUSH 100 UNIT/ML IV SOLN
500.0000 [IU] | Freq: Once | INTRAVENOUS | Status: DC
  Filled 2021-09-28: qty 5

## 2021-09-28 MED ORDER — SODIUM CHLORIDE 0.9% FLUSH
10.0000 mL | INTRAVENOUS | Status: DC | PRN
  Filled 2021-09-28: qty 10

## 2021-09-28 MED ORDER — SODIUM CHLORIDE 0.9 % IV SOLN
10.0000 mg | Freq: Once | INTRAVENOUS | Status: AC
  Administered 2021-09-28: 10 mg via INTRAVENOUS
  Filled 2021-09-28: qty 10

## 2021-09-28 MED ORDER — SODIUM CHLORIDE 0.9 % IV SOLN
INTRAVENOUS | Status: DC
  Filled 2021-09-28: qty 250

## 2021-09-29 ENCOUNTER — Telehealth: Payer: Self-pay

## 2021-09-29 LAB — CEA: CEA: 2.3 ng/mL (ref 0.0–4.7)

## 2021-09-29 NOTE — Telephone Encounter (Signed)
Pt has been advised of Pre-Admission date/time, and Surgery date.  Surgery Date: 10/03/2021 Preadmission Testing Date: 10/14/21 (phone 8A-12P) Patient is aware to stop her Aspirin for one week prior and to do a Fleets enema the night before surgery.  Patient has been made aware to call [phone], between 1-3:00pm the day before surgery, to find out what time to arrive for surgery.

## 2021-09-30 ENCOUNTER — Encounter: Payer: Self-pay | Admitting: Registered Nurse

## 2021-09-30 ENCOUNTER — Inpatient Hospital Stay: Payer: Medicare HMO

## 2021-09-30 DIAGNOSIS — C186 Malignant neoplasm of descending colon: Secondary | ICD-10-CM | POA: Diagnosis not present

## 2021-09-30 DIAGNOSIS — N289 Disorder of kidney and ureter, unspecified: Secondary | ICD-10-CM | POA: Diagnosis not present

## 2021-09-30 DIAGNOSIS — D649 Anemia, unspecified: Secondary | ICD-10-CM | POA: Diagnosis not present

## 2021-09-30 DIAGNOSIS — C7801 Secondary malignant neoplasm of right lung: Secondary | ICD-10-CM | POA: Diagnosis not present

## 2021-09-30 DIAGNOSIS — Z5111 Encounter for antineoplastic chemotherapy: Secondary | ICD-10-CM | POA: Diagnosis not present

## 2021-09-30 MED ORDER — HEPARIN SOD (PORK) LOCK FLUSH 100 UNIT/ML IV SOLN
INTRAVENOUS | Status: AC
  Filled 2021-09-30: qty 5

## 2021-09-30 MED ORDER — SODIUM CHLORIDE 0.9% FLUSH
10.0000 mL | INTRAVENOUS | Status: DC | PRN
  Administered 2021-09-30: 10 mL
  Filled 2021-09-30: qty 10

## 2021-09-30 MED ORDER — HEPARIN SOD (PORK) LOCK FLUSH 100 UNIT/ML IV SOLN
500.0000 [IU] | Freq: Once | INTRAVENOUS | Status: AC | PRN
  Administered 2021-09-30: 500 [IU]
  Filled 2021-09-30: qty 5

## 2021-10-04 ENCOUNTER — Other Ambulatory Visit: Payer: Medicare HMO

## 2021-10-06 ENCOUNTER — Inpatient Hospital Stay: Payer: Medicare HMO | Attending: Internal Medicine

## 2021-10-08 ENCOUNTER — Inpatient Hospital Stay: Payer: Medicare HMO

## 2021-10-08 DIAGNOSIS — C186 Malignant neoplasm of descending colon: Secondary | ICD-10-CM

## 2021-10-08 MED FILL — Dexamethasone Sodium Phosphate Inj 100 MG/10ML: INTRAMUSCULAR | Qty: 1 | Status: AC

## 2021-10-14 ENCOUNTER — Encounter
Admission: RE | Admit: 2021-10-14 | Discharge: 2021-10-14 | Disposition: A | Discharge status: Home / Self Care | Payer: Medicare HMO | Source: Ambulatory Visit | Attending: Surgery | Admitting: Surgery

## 2021-10-14 ENCOUNTER — Other Ambulatory Visit: Payer: Self-pay

## 2021-10-14 ENCOUNTER — Other Ambulatory Visit: Payer: Medicare HMO

## 2021-10-14 VITALS — Ht 64.0 in | Wt 158.0 lb

## 2021-10-14 DIAGNOSIS — E782 Mixed hyperlipidemia: Secondary | ICD-10-CM

## 2021-10-14 DIAGNOSIS — I10 Essential (primary) hypertension: Secondary | ICD-10-CM

## 2021-10-14 HISTORY — DX: Scarlet fever, uncomplicated: A38.9

## 2021-10-14 NOTE — Patient Instructions (Addendum)
Your procedure is scheduled on: 10/25/2021 Report to [facility]. To find out your arrival time please call [phone] between 1PM - 3PM on 10/20/21.  Remember: Instructions that are not followed completely may result in serious medical risk, up to and including death, or upon the discretion of your surgeon and anesthesiologist your surgery may need to be rescheduled.     _X__ 1. Do not eat food after midnight the night before your procedure.                 No gum chewing or hard candies. You may drink clear liquids up to 2 hours                 before you are scheduled to arrive for your surgery- DO not drink clear                 liquids within 2 hours of the start of your surgery.                 Clear Liquids include:  water, apple juice without pulp, clear carbohydrate                 drink such as Clearfast or Gatorade, Black Coffee or Tea (Do not add                 anything to coffee or tea). Diabetics water only  __X__2.  On the morning of surgery brush your teeth with toothpaste and water, you                 may rinse your mouth with mouthwash if you wish.  Do not swallow any              toothpaste of mouthwash.     _X__ 3.  No Alcohol for 24 hours before or after surgery.   _X__ 4.  Do Not Smoke or use e-cigarettes For 24 Hours Prior to Your Surgery.                 Do not use any chewable tobacco products for at least 6 hours prior to                 surgery.  ____  5.  Bring all medications with you on the day of surgery if instructed.   __X__  6.  Notify your doctor if there is any change in your medical condition      (cold, fever, infections).     Do not wear jewelry, make-up, hairpins, clips or nail polish. Do not wear lotions, powders, or perfumes.  Do not shave 48 hours prior to surgery. Men may shave face and neck. Do not bring valuables to the hospital.    [facility] is not responsible for any belongings or  valuables.  Contacts, dentures/partials or body piercings may not be worn into surgery. Bring a case for your contacts, glasses or hearing aids, a denture cup will be supplied. Leave your suitcase in the car. After surgery it may be brought to your room. For patients admitted to the hospital, discharge time is determined by your treatment team.   Patients discharged the day of surgery will not be allowed to drive home.   Please read over the following fact sheets that you were given:   CHG SOAP OR SAGE WIPES, FLEETS ENEMA  __X__ Take these medicines the morning of surgery with A  SIP OF WATER:    1. levothyroxine (SYNTHROID) 50 MCG tablet  2.   3.   4.  5.  6.  __X__ Fleet Enema (as directed) AT BEDTIME AND MORNING OF SURGERY  __X__ Use CHG Soap/SAGE wipes as directed  ____ Use inhalers on the day of surgery  ____ Stop metformin/Janumet/Farxiga 2 days prior to surgery    ____ Take 1/2 of usual insulin dose the night before surgery. No insulin the morning          of surgery.   ____ Stop Blood Thinners Coumadin/Plavix/Xarelto/Pleta/Pradaxa/Eliquis/Effient/Aspirin  on  Or contact your Surgeon, Cardiologist or Medical Doctor regarding  ability to stop your blood thinners  __X__ Stop Anti-inflammatories 7 days before surgery such as Advil, Ibuprofen, Motrin,  BC or Goodies Powder, Naprosyn, Naproxen, Aleve, Aspirin    __X__ Stop all herbal supplements, fish oil or vitamin E until after surgery.    ____ Bring C-Pap to the hospital.

## 2021-10-15 DIAGNOSIS — E039 Hypothyroidism, unspecified: Secondary | ICD-10-CM | POA: Diagnosis not present

## 2021-10-15 DIAGNOSIS — E782 Mixed hyperlipidemia: Secondary | ICD-10-CM | POA: Diagnosis not present

## 2021-10-15 DIAGNOSIS — I10 Essential (primary) hypertension: Secondary | ICD-10-CM | POA: Diagnosis not present

## 2021-10-19 ENCOUNTER — Encounter
Admission: RE | Admit: 2021-10-19 | Discharge: 2021-10-19 | Disposition: A | Discharge status: Home / Self Care | Payer: Medicare HMO | Source: Ambulatory Visit | Attending: Surgery | Admitting: Surgery

## 2021-10-19 DIAGNOSIS — I10 Essential (primary) hypertension: Secondary | ICD-10-CM | POA: Insufficient documentation

## 2021-10-19 DIAGNOSIS — J189 Pneumonia, unspecified organism: Secondary | ICD-10-CM | POA: Diagnosis not present

## 2021-10-19 DIAGNOSIS — N179 Acute kidney failure, unspecified: Secondary | ICD-10-CM | POA: Diagnosis not present

## 2021-10-19 DIAGNOSIS — E785 Hyperlipidemia, unspecified: Secondary | ICD-10-CM | POA: Diagnosis present

## 2021-10-19 DIAGNOSIS — R9431 Abnormal electrocardiogram [ECG] [EKG]: Secondary | ICD-10-CM | POA: Diagnosis not present

## 2021-10-19 DIAGNOSIS — D62 Acute posthemorrhagic anemia: Secondary | ICD-10-CM | POA: Diagnosis not present

## 2021-10-19 DIAGNOSIS — J90 Pleural effusion, not elsewhere classified: Secondary | ICD-10-CM | POA: Diagnosis not present

## 2021-10-19 DIAGNOSIS — C189 Malignant neoplasm of colon, unspecified: Secondary | ICD-10-CM | POA: Diagnosis not present

## 2021-10-19 DIAGNOSIS — E1122 Type 2 diabetes mellitus with diabetic chronic kidney disease: Secondary | ICD-10-CM | POA: Diagnosis present

## 2021-10-19 DIAGNOSIS — E782 Mixed hyperlipidemia: Secondary | ICD-10-CM | POA: Insufficient documentation

## 2021-10-19 DIAGNOSIS — I517 Cardiomegaly: Secondary | ICD-10-CM | POA: Diagnosis not present

## 2021-10-19 DIAGNOSIS — C3490 Malignant neoplasm of unspecified part of unspecified bronchus or lung: Secondary | ICD-10-CM | POA: Diagnosis present

## 2021-10-19 DIAGNOSIS — E871 Hypo-osmolality and hyponatremia: Secondary | ICD-10-CM | POA: Diagnosis not present

## 2021-10-19 DIAGNOSIS — K5939 Other megacolon: Secondary | ICD-10-CM | POA: Diagnosis not present

## 2021-10-19 DIAGNOSIS — E039 Hypothyroidism, unspecified: Secondary | ICD-10-CM | POA: Diagnosis present

## 2021-10-19 DIAGNOSIS — J9602 Acute respiratory failure with hypercapnia: Secondary | ICD-10-CM | POA: Diagnosis not present

## 2021-10-19 DIAGNOSIS — R509 Fever, unspecified: Secondary | ICD-10-CM | POA: Diagnosis not present

## 2021-10-19 DIAGNOSIS — K565 Intestinal adhesions [bands], unspecified as to partial versus complete obstruction: Secondary | ICD-10-CM | POA: Diagnosis present

## 2021-10-19 DIAGNOSIS — E872 Acidosis, unspecified: Secondary | ICD-10-CM | POA: Diagnosis not present

## 2021-10-19 DIAGNOSIS — I4892 Unspecified atrial flutter: Secondary | ICD-10-CM | POA: Diagnosis not present

## 2021-10-19 DIAGNOSIS — R079 Chest pain, unspecified: Secondary | ICD-10-CM | POA: Diagnosis not present

## 2021-10-19 DIAGNOSIS — Z432 Encounter for attention to ileostomy: Secondary | ICD-10-CM | POA: Diagnosis not present

## 2021-10-19 DIAGNOSIS — R0902 Hypoxemia: Secondary | ICD-10-CM | POA: Diagnosis not present

## 2021-10-19 DIAGNOSIS — I130 Hypertensive heart and chronic kidney disease with heart failure and stage 1 through stage 4 chronic kidney disease, or unspecified chronic kidney disease: Secondary | ICD-10-CM | POA: Diagnosis present

## 2021-10-19 DIAGNOSIS — I480 Paroxysmal atrial fibrillation: Secondary | ICD-10-CM | POA: Diagnosis not present

## 2021-10-19 DIAGNOSIS — R059 Cough, unspecified: Secondary | ICD-10-CM | POA: Diagnosis not present

## 2021-10-19 DIAGNOSIS — R1110 Vomiting, unspecified: Secondary | ICD-10-CM | POA: Diagnosis not present

## 2021-10-19 DIAGNOSIS — J9601 Acute respiratory failure with hypoxia: Secondary | ICD-10-CM | POA: Diagnosis not present

## 2021-10-19 DIAGNOSIS — K5730 Diverticulosis of large intestine without perforation or abscess without bleeding: Secondary | ICD-10-CM | POA: Diagnosis not present

## 2021-10-19 DIAGNOSIS — C186 Malignant neoplasm of descending colon: Secondary | ICD-10-CM | POA: Diagnosis present

## 2021-10-19 DIAGNOSIS — Z66 Do not resuscitate: Secondary | ICD-10-CM | POA: Diagnosis not present

## 2021-10-19 DIAGNOSIS — Z9889 Other specified postprocedural states: Secondary | ICD-10-CM | POA: Diagnosis present

## 2021-10-19 DIAGNOSIS — Z9049 Acquired absence of other specified parts of digestive tract: Secondary | ICD-10-CM | POA: Diagnosis not present

## 2021-10-19 DIAGNOSIS — R0989 Other specified symptoms and signs involving the circulatory and respiratory systems: Secondary | ICD-10-CM | POA: Diagnosis not present

## 2021-10-19 DIAGNOSIS — K432 Incisional hernia without obstruction or gangrene: Secondary | ICD-10-CM | POA: Diagnosis not present

## 2021-10-19 DIAGNOSIS — I4891 Unspecified atrial fibrillation: Secondary | ICD-10-CM | POA: Diagnosis not present

## 2021-10-19 DIAGNOSIS — I2510 Atherosclerotic heart disease of native coronary artery without angina pectoris: Secondary | ICD-10-CM | POA: Diagnosis not present

## 2021-10-19 DIAGNOSIS — J9811 Atelectasis: Secondary | ICD-10-CM | POA: Diagnosis not present

## 2021-10-19 DIAGNOSIS — K6389 Other specified diseases of intestine: Secondary | ICD-10-CM | POA: Diagnosis not present

## 2021-10-19 DIAGNOSIS — K567 Ileus, unspecified: Secondary | ICD-10-CM | POA: Diagnosis not present

## 2021-10-19 DIAGNOSIS — Z01810 Encounter for preprocedural cardiovascular examination: Secondary | ICD-10-CM | POA: Insufficient documentation

## 2021-10-19 DIAGNOSIS — C787 Secondary malignant neoplasm of liver and intrahepatic bile duct: Secondary | ICD-10-CM | POA: Diagnosis present

## 2021-10-19 DIAGNOSIS — K439 Ventral hernia without obstruction or gangrene: Secondary | ICD-10-CM | POA: Diagnosis not present

## 2021-10-19 DIAGNOSIS — Z4682 Encounter for fitting and adjustment of non-vascular catheter: Secondary | ICD-10-CM | POA: Diagnosis not present

## 2021-10-19 DIAGNOSIS — Z933 Colostomy status: Secondary | ICD-10-CM | POA: Diagnosis not present

## 2021-10-19 DIAGNOSIS — Z515 Encounter for palliative care: Secondary | ICD-10-CM | POA: Diagnosis not present

## 2021-10-19 DIAGNOSIS — Z96642 Presence of left artificial hip joint: Secondary | ICD-10-CM | POA: Diagnosis not present

## 2021-10-19 DIAGNOSIS — I5033 Acute on chronic diastolic (congestive) heart failure: Secondary | ICD-10-CM | POA: Diagnosis not present

## 2021-10-19 DIAGNOSIS — K9189 Other postprocedural complications and disorders of digestive system: Secondary | ICD-10-CM | POA: Diagnosis not present

## 2021-10-19 DIAGNOSIS — Z932 Ileostomy status: Secondary | ICD-10-CM | POA: Diagnosis not present

## 2021-10-19 DIAGNOSIS — R0602 Shortness of breath: Secondary | ICD-10-CM | POA: Diagnosis not present

## 2021-10-19 DIAGNOSIS — N189 Chronic kidney disease, unspecified: Secondary | ICD-10-CM | POA: Diagnosis not present

## 2021-10-19 DIAGNOSIS — E663 Overweight: Secondary | ICD-10-CM | POA: Diagnosis not present

## 2021-10-19 DIAGNOSIS — N1831 Chronic kidney disease, stage 3a: Secondary | ICD-10-CM | POA: Diagnosis present

## 2021-10-21 ENCOUNTER — Encounter: Admission: RE | Disposition: E | Payer: Self-pay | Source: Home / Self Care | Attending: Osteopathic Medicine

## 2021-10-21 ENCOUNTER — Inpatient Hospital Stay
Admission: RE | Admit: 2021-10-21 | Discharge: 2021-11-02 | DRG: 329 | Disposition: E | Payer: Medicare HMO | Attending: Osteopathic Medicine | Admitting: Osteopathic Medicine

## 2021-10-21 ENCOUNTER — Other Ambulatory Visit: Payer: Self-pay

## 2021-10-21 ENCOUNTER — Inpatient Hospital Stay: Payer: Medicare HMO | Admitting: Certified Registered Nurse Anesthetist

## 2021-10-21 ENCOUNTER — Encounter: Payer: Self-pay | Admitting: Surgery

## 2021-10-21 DIAGNOSIS — Z515 Encounter for palliative care: Secondary | ICD-10-CM | POA: Diagnosis not present

## 2021-10-21 DIAGNOSIS — J9601 Acute respiratory failure with hypoxia: Secondary | ICD-10-CM | POA: Diagnosis not present

## 2021-10-21 DIAGNOSIS — N189 Chronic kidney disease, unspecified: Secondary | ICD-10-CM | POA: Diagnosis present

## 2021-10-21 DIAGNOSIS — N1831 Chronic kidney disease, stage 3a: Secondary | ICD-10-CM | POA: Diagnosis present

## 2021-10-21 DIAGNOSIS — Z9889 Other specified postprocedural states: Secondary | ICD-10-CM | POA: Diagnosis present

## 2021-10-21 DIAGNOSIS — E663 Overweight: Secondary | ICD-10-CM | POA: Diagnosis not present

## 2021-10-21 DIAGNOSIS — Z8673 Personal history of transient ischemic attack (TIA), and cerebral infarction without residual deficits: Secondary | ICD-10-CM

## 2021-10-21 DIAGNOSIS — C787 Secondary malignant neoplasm of liver and intrahepatic bile duct: Secondary | ICD-10-CM | POA: Diagnosis present

## 2021-10-21 DIAGNOSIS — Z9049 Acquired absence of other specified parts of digestive tract: Secondary | ICD-10-CM | POA: Diagnosis not present

## 2021-10-21 DIAGNOSIS — K567 Ileus, unspecified: Secondary | ICD-10-CM | POA: Diagnosis not present

## 2021-10-21 DIAGNOSIS — J9811 Atelectasis: Secondary | ICD-10-CM

## 2021-10-21 DIAGNOSIS — I951 Orthostatic hypotension: Secondary | ICD-10-CM | POA: Diagnosis not present

## 2021-10-21 DIAGNOSIS — I130 Hypertensive heart and chronic kidney disease with heart failure and stage 1 through stage 4 chronic kidney disease, or unspecified chronic kidney disease: Secondary | ICD-10-CM | POA: Diagnosis present

## 2021-10-21 DIAGNOSIS — J9602 Acute respiratory failure with hypercapnia: Secondary | ICD-10-CM | POA: Diagnosis not present

## 2021-10-21 DIAGNOSIS — I4891 Unspecified atrial fibrillation: Secondary | ICD-10-CM

## 2021-10-21 DIAGNOSIS — E872 Acidosis, unspecified: Secondary | ICD-10-CM | POA: Diagnosis not present

## 2021-10-21 DIAGNOSIS — Z9221 Personal history of antineoplastic chemotherapy: Secondary | ICD-10-CM

## 2021-10-21 DIAGNOSIS — I480 Paroxysmal atrial fibrillation: Secondary | ICD-10-CM | POA: Diagnosis not present

## 2021-10-21 DIAGNOSIS — I4892 Unspecified atrial flutter: Secondary | ICD-10-CM | POA: Diagnosis not present

## 2021-10-21 DIAGNOSIS — K9189 Other postprocedural complications and disorders of digestive system: Secondary | ICD-10-CM | POA: Diagnosis not present

## 2021-10-21 DIAGNOSIS — D62 Acute posthemorrhagic anemia: Secondary | ICD-10-CM | POA: Diagnosis not present

## 2021-10-21 DIAGNOSIS — C189 Malignant neoplasm of colon, unspecified: Secondary | ICD-10-CM | POA: Diagnosis present

## 2021-10-21 DIAGNOSIS — I5033 Acute on chronic diastolic (congestive) heart failure: Secondary | ICD-10-CM | POA: Diagnosis not present

## 2021-10-21 DIAGNOSIS — E875 Hyperkalemia: Secondary | ICD-10-CM | POA: Diagnosis not present

## 2021-10-21 DIAGNOSIS — K432 Incisional hernia without obstruction or gangrene: Secondary | ICD-10-CM | POA: Diagnosis not present

## 2021-10-21 DIAGNOSIS — M109 Gout, unspecified: Secondary | ICD-10-CM | POA: Diagnosis present

## 2021-10-21 DIAGNOSIS — N179 Acute kidney failure, unspecified: Secondary | ICD-10-CM | POA: Diagnosis not present

## 2021-10-21 DIAGNOSIS — I10 Essential (primary) hypertension: Secondary | ICD-10-CM | POA: Diagnosis present

## 2021-10-21 DIAGNOSIS — Z932 Ileostomy status: Secondary | ICD-10-CM

## 2021-10-21 DIAGNOSIS — E785 Hyperlipidemia, unspecified: Secondary | ICD-10-CM | POA: Diagnosis present

## 2021-10-21 DIAGNOSIS — K435 Parastomal hernia without obstruction or  gangrene: Secondary | ICD-10-CM | POA: Diagnosis present

## 2021-10-21 DIAGNOSIS — C3490 Malignant neoplasm of unspecified part of unspecified bronchus or lung: Secondary | ICD-10-CM | POA: Diagnosis present

## 2021-10-21 DIAGNOSIS — J189 Pneumonia, unspecified organism: Secondary | ICD-10-CM | POA: Diagnosis not present

## 2021-10-21 DIAGNOSIS — E039 Hypothyroidism, unspecified: Secondary | ICD-10-CM | POA: Diagnosis present

## 2021-10-21 DIAGNOSIS — K565 Intestinal adhesions [bands], unspecified as to partial versus complete obstruction: Secondary | ICD-10-CM | POA: Diagnosis present

## 2021-10-21 DIAGNOSIS — Z432 Encounter for attention to ileostomy: Secondary | ICD-10-CM | POA: Diagnosis not present

## 2021-10-21 DIAGNOSIS — R54 Age-related physical debility: Secondary | ICD-10-CM | POA: Diagnosis present

## 2021-10-21 DIAGNOSIS — E871 Hypo-osmolality and hyponatremia: Secondary | ICD-10-CM

## 2021-10-21 DIAGNOSIS — C186 Malignant neoplasm of descending colon: Secondary | ICD-10-CM | POA: Diagnosis present

## 2021-10-21 DIAGNOSIS — Z66 Do not resuscitate: Secondary | ICD-10-CM | POA: Diagnosis not present

## 2021-10-21 DIAGNOSIS — F17210 Nicotine dependence, cigarettes, uncomplicated: Secondary | ICD-10-CM | POA: Diagnosis present

## 2021-10-21 DIAGNOSIS — E1122 Type 2 diabetes mellitus with diabetic chronic kidney disease: Secondary | ICD-10-CM | POA: Diagnosis present

## 2021-10-21 DIAGNOSIS — Z6828 Body mass index (BMI) 28.0-28.9, adult: Secondary | ICD-10-CM

## 2021-10-21 DIAGNOSIS — R9431 Abnormal electrocardiogram [ECG] [EKG]: Secondary | ICD-10-CM | POA: Diagnosis not present

## 2021-10-21 HISTORY — PX: COLOSTOMY TAKEDOWN: SHX5783

## 2021-10-21 HISTORY — PX: VENTRAL HERNIA REPAIR: SHX424

## 2021-10-21 HISTORY — PX: ABDOMINAL WALL DEFECT REPAIR: SHX53

## 2021-10-21 LAB — CBC
HCT: 37.3 % (ref 36.0–46.0)
Hemoglobin: 11.8 g/dL — ABNORMAL LOW (ref 12.0–15.0)
MCH: 31.5 pg (ref 26.0–34.0)
MCHC: 31.6 g/dL (ref 30.0–36.0)
MCV: 99.5 fL (ref 80.0–100.0)
Platelets: 189 10*3/uL (ref 150–400)
RBC: 3.75 MIL/uL — ABNORMAL LOW (ref 3.87–5.11)
RDW: 15.1 % (ref 11.5–15.5)
WBC: 2.9 10*3/uL — ABNORMAL LOW (ref 4.0–10.5)
nRBC: 0 % (ref 0.0–0.2)

## 2021-10-21 SURGERY — CLOSURE, COLOSTOMY
Anesthesia: General

## 2021-10-21 MED ORDER — OXYCODONE HCL 5 MG PO TABS: 5.0000 mg | ORAL_TABLET | Freq: Once | ORAL | Status: DC | PRN

## 2021-10-21 MED ORDER — DIPHENHYDRAMINE HCL 12.5 MG/5ML PO ELIX: 12.5000 mg | ORAL_SOLUTION | Freq: Four times a day (QID) | ORAL | Status: DC | PRN

## 2021-10-21 MED ORDER — METHOCARBAMOL 500 MG PO TABS
500.0000 mg | ORAL_TABLET | Freq: Three times a day (TID) | ORAL | Status: DC | PRN
  Administered 2021-10-28: 500 mg via ORAL
  Filled 2021-10-21: qty 1

## 2021-10-21 MED ORDER — LACTATED RINGERS IV SOLN: INTRAVENOUS | Status: DC

## 2021-10-21 MED ORDER — FENTANYL CITRATE (PF) 100 MCG/2ML IJ SOLN
INTRAMUSCULAR | Status: AC
  Filled 2021-10-21: qty 2

## 2021-10-21 MED ORDER — PREGABALIN 50 MG PO CAPS
50.0000 mg | ORAL_CAPSULE | Freq: Three times a day (TID) | ORAL | Status: DC
  Administered 2021-10-21 – 2021-10-28 (×20): 50 mg via ORAL
  Filled 2021-10-21 (×21): qty 1

## 2021-10-21 MED ORDER — CELECOXIB 200 MG PO CAPS
ORAL_CAPSULE | ORAL | Status: AC
  Administered 2021-10-21: 200 mg via ORAL
  Filled 2021-10-21: qty 1

## 2021-10-21 MED ORDER — SODIUM CHLORIDE 0.9 % IV SOLN
INTRAVENOUS | Status: DC | PRN
  Administered 2021-10-21: 70 mL

## 2021-10-21 MED ORDER — FLEET ENEMA 7-19 GM/118ML RE ENEM: 1.0000 | ENEMA | Freq: Once | RECTAL | Status: DC

## 2021-10-21 MED ORDER — CHLORHEXIDINE GLUCONATE CLOTH 2 % EX PADS
6.0000 | MEDICATED_PAD | Freq: Once | CUTANEOUS | Status: AC
  Administered 2021-10-21: 6 via TOPICAL

## 2021-10-21 MED ORDER — ALVIMOPAN 12 MG PO CAPS
ORAL_CAPSULE | ORAL | Status: AC
  Administered 2021-10-21: 12 mg via ORAL
  Filled 2021-10-21: qty 1

## 2021-10-21 MED ORDER — KETAMINE HCL 50 MG/5ML IJ SOSY
PREFILLED_SYRINGE | INTRAMUSCULAR | Status: AC
  Filled 2021-10-21: qty 5

## 2021-10-21 MED ORDER — FAMOTIDINE 20 MG PO TABS: 20.0000 mg | ORAL_TABLET | Freq: Once | ORAL | Status: AC

## 2021-10-21 MED ORDER — ENOXAPARIN SODIUM 40 MG/0.4ML IJ SOSY: 40.0000 mg | PREFILLED_SYRINGE | INTRAMUSCULAR | Status: DC

## 2021-10-21 MED ORDER — PANTOPRAZOLE SODIUM 40 MG IV SOLR
40.0000 mg | Freq: Two times a day (BID) | INTRAVENOUS | Status: DC
  Administered 2021-10-21 – 2021-11-01 (×22): 40 mg via INTRAVENOUS
  Filled 2021-10-21 (×22): qty 10

## 2021-10-21 MED ORDER — PROMETHAZINE HCL 25 MG/ML IJ SOLN: 6.2500 mg | INTRAMUSCULAR | Status: DC | PRN

## 2021-10-21 MED ORDER — METHOCARBAMOL 1000 MG/10ML IJ SOLN
500.0000 mg | Freq: Three times a day (TID) | INTRAVENOUS | Status: DC | PRN
  Filled 2021-10-21: qty 5

## 2021-10-21 MED ORDER — ALVIMOPAN 12 MG PO CAPS
12.0000 mg | ORAL_CAPSULE | ORAL | Status: DC
  Filled 2021-10-21: qty 1

## 2021-10-21 MED ORDER — FENTANYL CITRATE (PF) 100 MCG/2ML IJ SOLN
INTRAMUSCULAR | Status: DC | PRN
  Administered 2021-10-21: 25 ug via INTRAVENOUS
  Administered 2021-10-21 (×2): 50 ug via INTRAVENOUS

## 2021-10-21 MED ORDER — OXYCODONE HCL 5 MG/5ML PO SOLN: 5.0000 mg | Freq: Once | ORAL | Status: DC | PRN

## 2021-10-21 MED ORDER — CHLORHEXIDINE GLUCONATE 0.12 % MT SOLN
OROMUCOSAL | Status: AC
  Administered 2021-10-21: 15 mL via OROMUCOSAL
  Filled 2021-10-21: qty 15

## 2021-10-21 MED ORDER — PROPOFOL 10 MG/ML IV BOLUS
INTRAVENOUS | Status: DC | PRN
  Administered 2021-10-21: 150 mg via INTRAVENOUS

## 2021-10-21 MED ORDER — MORPHINE SULFATE (PF) 2 MG/ML IV SOLN
2.0000 mg | INTRAVENOUS | Status: DC | PRN
  Administered 2021-10-30: 2 mg via INTRAVENOUS
  Filled 2021-10-21: qty 1

## 2021-10-21 MED ORDER — CHLORHEXIDINE GLUCONATE 0.12 % MT SOLN: 15.0000 mL | Freq: Once | OROMUCOSAL | Status: AC

## 2021-10-21 MED ORDER — FENTANYL CITRATE (PF) 100 MCG/2ML IJ SOLN: 25.0000 ug | INTRAMUSCULAR | Status: DC | PRN

## 2021-10-21 MED ORDER — BUPIVACAINE-EPINEPHRINE 0.25% -1:200000 IJ SOLN
INTRAMUSCULAR | Status: DC | PRN
  Administered 2021-10-21: 30 mL

## 2021-10-21 MED ORDER — PROCHLORPERAZINE EDISYLATE 10 MG/2ML IJ SOLN: 5.0000 mg | Freq: Four times a day (QID) | INTRAMUSCULAR | Status: DC | PRN

## 2021-10-21 MED ORDER — ACETAMINOPHEN 500 MG PO TABS: 1000.0000 mg | ORAL_TABLET | ORAL | Status: AC

## 2021-10-21 MED ORDER — VISTASEAL 10 ML SINGLE DOSE KIT
PACK | CUTANEOUS | Status: DC | PRN
  Administered 2021-10-21: 10 mL via TOPICAL

## 2021-10-21 MED ORDER — CELECOXIB 200 MG PO CAPS: 200.0000 mg | ORAL_CAPSULE | ORAL | Status: AC

## 2021-10-21 MED ORDER — SODIUM CHLORIDE 0.9 % IV SOLN
INTRAVENOUS | Status: AC
  Filled 2021-10-21: qty 2

## 2021-10-21 MED ORDER — KETAMINE HCL 10 MG/ML IJ SOLN
INTRAMUSCULAR | Status: DC | PRN
  Administered 2021-10-21: 20 mg via INTRAVENOUS

## 2021-10-21 MED ORDER — DROPERIDOL 2.5 MG/ML IJ SOLN: 0.6250 mg | Freq: Once | INTRAMUSCULAR | Status: DC | PRN

## 2021-10-21 MED ORDER — OXYCODONE HCL 5 MG PO TABS: 5.0000 mg | ORAL_TABLET | ORAL | Status: DC | PRN

## 2021-10-21 MED ORDER — GLYCOPYRROLATE 0.2 MG/ML IJ SOLN
INTRAMUSCULAR | Status: DC | PRN
  Administered 2021-10-21: .1 mg via INTRAVENOUS

## 2021-10-21 MED ORDER — PROPOFOL 10 MG/ML IV BOLUS
INTRAVENOUS | Status: AC
Start: 2021-10-21 — End: ?
  Filled 2021-10-21: qty 20

## 2021-10-21 MED ORDER — ALVIMOPAN 12 MG PO CAPS: 12.0000 mg | ORAL_CAPSULE | ORAL | Status: AC

## 2021-10-21 MED ORDER — ONDANSETRON 4 MG PO TBDP: 4.0000 mg | ORAL_TABLET | Freq: Four times a day (QID) | ORAL | Status: DC | PRN

## 2021-10-21 MED ORDER — SODIUM CHLORIDE 0.9 % IV SOLN
INTRAVENOUS | Status: DC
Start: 2021-10-21 — End: 2021-10-22

## 2021-10-21 MED ORDER — LIDOCAINE HCL (PF) 2 % IJ SOLN
INTRAMUSCULAR | Status: AC
Start: 2021-10-21 — End: ?
  Filled 2021-10-21: qty 5

## 2021-10-21 MED ORDER — FAMOTIDINE 20 MG PO TABS
ORAL_TABLET | ORAL | Status: AC
  Administered 2021-10-21: 20 mg via ORAL
  Filled 2021-10-21: qty 1

## 2021-10-21 MED ORDER — SODIUM CHLORIDE 0.9 % IV SOLN
2.0000 g | INTRAVENOUS | Status: AC
  Administered 2021-10-21 (×2): 2 g via INTRAVENOUS

## 2021-10-21 MED ORDER — GABAPENTIN 300 MG PO CAPS
ORAL_CAPSULE | ORAL | Status: AC
  Administered 2021-10-21: 300 mg via ORAL
  Filled 2021-10-21: qty 1

## 2021-10-21 MED ORDER — MELATONIN 5 MG PO TABS: 2.5000 mg | ORAL_TABLET | Freq: Every evening | ORAL | Status: DC | PRN

## 2021-10-21 MED ORDER — SODIUM CHLORIDE (PF) 0.9 % IJ SOLN
INTRAMUSCULAR | Status: AC
  Filled 2021-10-21: qty 50

## 2021-10-21 MED ORDER — MIDAZOLAM HCL 2 MG/2ML IJ SOLN
INTRAMUSCULAR | Status: AC
  Filled 2021-10-21: qty 2

## 2021-10-21 MED ORDER — DEXAMETHASONE SODIUM PHOSPHATE 10 MG/ML IJ SOLN
INTRAMUSCULAR | Status: DC | PRN
  Administered 2021-10-21: 10 mg via INTRAVENOUS

## 2021-10-21 MED ORDER — BUPIVACAINE-EPINEPHRINE (PF) 0.25% -1:200000 IJ SOLN
INTRAMUSCULAR | Status: AC
  Filled 2021-10-21: qty 30

## 2021-10-21 MED ORDER — ONDANSETRON HCL 4 MG/2ML IJ SOLN
INTRAMUSCULAR | Status: AC
  Filled 2021-10-21: qty 2

## 2021-10-21 MED ORDER — ORAL CARE MOUTH RINSE: 15.0000 mL | Freq: Once | OROMUCOSAL | Status: AC

## 2021-10-21 MED ORDER — SODIUM CHLORIDE 0.9 % IV SOLN
2.0000 g | Freq: Three times a day (TID) | INTRAVENOUS | Status: AC
  Administered 2021-10-21 – 2021-10-22 (×2): 2 g via INTRAVENOUS
  Filled 2021-10-21 (×2): qty 2

## 2021-10-21 MED ORDER — LEVOTHYROXINE SODIUM 50 MCG PO TABS
50.0000 ug | ORAL_TABLET | Freq: Every day | ORAL | Status: DC
  Administered 2021-10-22 – 2021-10-28 (×7): 50 ug via ORAL
  Filled 2021-10-21 (×7): qty 1

## 2021-10-21 MED ORDER — ONDANSETRON HCL 4 MG/2ML IJ SOLN: 4.0000 mg | Freq: Four times a day (QID) | INTRAMUSCULAR | Status: DC | PRN

## 2021-10-21 MED ORDER — ACETAMINOPHEN 500 MG PO TABS
ORAL_TABLET | ORAL | Status: AC
  Administered 2021-10-21: 1000 mg via ORAL
  Filled 2021-10-21: qty 2

## 2021-10-21 MED ORDER — LIDOCAINE HCL (CARDIAC) PF 100 MG/5ML IV SOSY
PREFILLED_SYRINGE | INTRAVENOUS | Status: DC | PRN
  Administered 2021-10-21: 80 mg via INTRAVENOUS

## 2021-10-21 MED ORDER — SUGAMMADEX SODIUM 200 MG/2ML IV SOLN
INTRAVENOUS | Status: DC | PRN
  Administered 2021-10-21: 300 mg via INTRAVENOUS

## 2021-10-21 MED ORDER — ROCURONIUM BROMIDE 100 MG/10ML IV SOLN
INTRAVENOUS | Status: DC | PRN
  Administered 2021-10-21: 50 mg via INTRAVENOUS
  Administered 2021-10-21: 20 mg via INTRAVENOUS
  Administered 2021-10-21: 30 mg via INTRAVENOUS

## 2021-10-21 MED ORDER — SODIUM CHLORIDE 0.9 % IV SOLN: INTRAVENOUS | Status: DC | PRN

## 2021-10-21 MED ORDER — GABAPENTIN 300 MG PO CAPS: 300.0000 mg | ORAL_CAPSULE | ORAL | Status: AC

## 2021-10-21 MED ORDER — ALLOPURINOL 100 MG PO TABS
100.0000 mg | ORAL_TABLET | Freq: Every evening | ORAL | Status: DC
  Administered 2021-10-21 – 2021-10-27 (×7): 100 mg via ORAL
  Filled 2021-10-21 (×7): qty 1

## 2021-10-21 MED ORDER — BUPIVACAINE LIPOSOME 1.3 % IJ SUSP
INTRAMUSCULAR | Status: AC
  Filled 2021-10-21: qty 20

## 2021-10-21 MED ORDER — ALBUMIN HUMAN 5 % IV SOLN
25.0000 g | Freq: Once | INTRAVENOUS | Status: AC
  Administered 2021-10-21: 25 g via INTRAVENOUS
  Filled 2021-10-21: qty 500

## 2021-10-21 MED ORDER — 0.9 % SODIUM CHLORIDE (POUR BTL) OPTIME
TOPICAL | Status: DC | PRN
  Administered 2021-10-21: 3000 mL

## 2021-10-21 MED ORDER — PROCHLORPERAZINE MALEATE 10 MG PO TABS: 10.0000 mg | ORAL_TABLET | Freq: Four times a day (QID) | ORAL | Status: DC | PRN

## 2021-10-21 MED ORDER — ACETAMINOPHEN 500 MG PO TABS
1000.0000 mg | ORAL_TABLET | Freq: Four times a day (QID) | ORAL | Status: DC
  Administered 2021-10-21 – 2021-10-28 (×28): 1000 mg via ORAL
  Filled 2021-10-21 (×29): qty 2

## 2021-10-21 MED ORDER — KETOROLAC TROMETHAMINE 15 MG/ML IJ SOLN
15.0000 mg | Freq: Four times a day (QID) | INTRAMUSCULAR | Status: DC
  Administered 2021-10-21 – 2021-10-22 (×4): 15 mg via INTRAVENOUS
  Filled 2021-10-21 (×4): qty 1

## 2021-10-21 MED ORDER — PHENYLEPHRINE HCL-NACL 20-0.9 MG/250ML-% IV SOLN
INTRAVENOUS | Status: DC | PRN
  Administered 2021-10-21: 50 ug/min via INTRAVENOUS

## 2021-10-21 MED ORDER — PHENYLEPHRINE 80 MCG/ML (10ML) SYRINGE FOR IV PUSH (FOR BLOOD PRESSURE SUPPORT)
PREFILLED_SYRINGE | INTRAVENOUS | Status: DC | PRN
  Administered 2021-10-21: 160 ug via INTRAVENOUS

## 2021-10-21 MED ORDER — DIPHENHYDRAMINE HCL 50 MG/ML IJ SOLN: 12.5000 mg | Freq: Four times a day (QID) | INTRAMUSCULAR | Status: DC | PRN

## 2021-10-21 MED ORDER — DEXMEDETOMIDINE HCL IN NACL 200 MCG/50ML IV SOLN
INTRAVENOUS | Status: DC | PRN
  Administered 2021-10-21: .2 ug/kg/h via INTRAVENOUS

## 2021-10-21 SURGICAL SUPPLY — 74 items
APPLIER CLIP 11 MED OPEN (CLIP)
APPLIER CLIP 13 LRG OPEN (CLIP)
APR CLP MED 11 20 MLT OPN (CLIP)
BARRIER ADH SEPRAFILM 3INX5IN (MISCELLANEOUS) ×3 IMPLANT
BLADE CLIPPER SURG (BLADE) ×1 IMPLANT
BULB RESERV EVAC DRAIN JP 100C (MISCELLANEOUS) ×2 IMPLANT
CATH URET ROBINSON 16FR STRL (CATHETERS) ×2 IMPLANT
CHLORAPREP W/TINT 26 (MISCELLANEOUS) ×2 IMPLANT
CLIP APPLIE 11 MED OPEN (CLIP) ×1 IMPLANT
CLIP APPLIE 13 LRG OPEN (CLIP) ×1 IMPLANT
DRAIN CHANNEL JP 19F (MISCELLANEOUS) ×2 IMPLANT
DRAPE LAPAROTOMY 100X77 ABD (DRAPES) ×2 IMPLANT
DRAPE LEGGINS SURG 28X43 STRL (DRAPES) IMPLANT
DRAPE UNDER BUTTOCK W/FLU (DRAPES) IMPLANT
DRSG OPSITE POSTOP 4X6 (GAUZE/BANDAGES/DRESSINGS) ×1 IMPLANT
DRSG TELFA 3X8 NADH (GAUZE/BANDAGES/DRESSINGS) IMPLANT
DRSG TELFA 4X3 1S NADH ST (GAUZE/BANDAGES/DRESSINGS) ×1 IMPLANT
ELECT BLADE 6.5 EXT (BLADE) ×2 IMPLANT
ELECT CAUTERY BLADE 6.4 (BLADE) ×2 IMPLANT
ELECT REM PT RETURN 9FT ADLT (ELECTROSURGICAL) ×2
ELECTRODE REM PT RTRN 9FT ADLT (ELECTROSURGICAL) ×1 IMPLANT
GAUZE 4X4 16PLY ~~LOC~~+RFID DBL (SPONGE) ×1 IMPLANT
GAUZE SPONGE 4X4 12PLY STRL (GAUZE/BANDAGES/DRESSINGS) ×1 IMPLANT
GLOVE BIO SURGEON STRL SZ7 (GLOVE) ×24 IMPLANT
GOWN STRL REUS W/ TWL LRG LVL3 (GOWN DISPOSABLE) ×6 IMPLANT
GOWN STRL REUS W/TWL LRG LVL3 (GOWN DISPOSABLE) ×20
HANDLE SUCTION POOLE (INSTRUMENTS) ×1 IMPLANT
KIT TURNOVER KIT A (KITS) ×2 IMPLANT
LIGASURE IMPACT 36 18CM CVD LR (INSTRUMENTS) ×1 IMPLANT
MANIFOLD NEPTUNE II (INSTRUMENTS) ×2 IMPLANT
MESH PHASIX RESORB RECT 20X25 (Mesh General) ×1 IMPLANT
MESH PHASIX RESORB RECT 25X30 (Mesh General) ×1 IMPLANT
NDL HYPO 25X1 1.5 SAFETY (NEEDLE) ×1 IMPLANT
NEEDLE HYPO 22GX1.5 SAFETY (NEEDLE) ×2 IMPLANT
NEEDLE HYPO 25X1 1.5 SAFETY (NEEDLE) ×2 IMPLANT
NS IRRIG 1000ML POUR BTL (IV SOLUTION) ×2 IMPLANT
PACK BASIN MAJOR ARMC (MISCELLANEOUS) ×2 IMPLANT
PACK BASIN MINOR ARMC (MISCELLANEOUS) ×2 IMPLANT
PACK COLON CLEAN CLOSURE (MISCELLANEOUS) ×2 IMPLANT
PAD DRESSING TELFA 3X8 NADH (GAUZE/BANDAGES/DRESSINGS) ×1 IMPLANT
PREVENA INCISION MGT 90 150 (MISCELLANEOUS) ×1 IMPLANT
RELOAD PROXIMATE 75MM BLUE (ENDOMECHANICALS) ×4 IMPLANT
RELOAD STAPLE 75 3.8 BLU REG (ENDOMECHANICALS) IMPLANT
SEPRAFILM MEMBRANE 5X6 (MISCELLANEOUS) IMPLANT
SOL PREP PVP 2OZ (MISCELLANEOUS)
SOLUTION PREP PVP 2OZ (MISCELLANEOUS) ×1 IMPLANT
SPONGE DRAIN TRACH 4X4 STRL 2S (GAUZE/BANDAGES/DRESSINGS) ×1 IMPLANT
SPONGE KITTNER 5P (MISCELLANEOUS) ×3 IMPLANT
SPONGE T-LAP 18X18 ~~LOC~~+RFID (SPONGE) ×10 IMPLANT
STAPLER CIRCULAR MANUAL XL 25 (STAPLE) ×1 IMPLANT
STAPLER PROXIMATE 75MM BLUE (STAPLE) ×1 IMPLANT
STAPLER SKIN PROX 35W (STAPLE) ×2 IMPLANT
SUCTION POOLE HANDLE (INSTRUMENTS)
SURGILUBE 2OZ TUBE FLIPTOP (MISCELLANEOUS) ×2 IMPLANT
SUT ETHIBOND 0 MO6 C/R (SUTURE) ×1 IMPLANT
SUT ETHILON 3-0 FS-10 30 BLK (SUTURE) ×4
SUT PDS AB 0 CT1 27 (SUTURE) ×8 IMPLANT
SUT SILK 2 0 (SUTURE) ×4
SUT SILK 2 0 SH CR/8 (SUTURE) IMPLANT
SUT SILK 2 0SH CR/8 30 (SUTURE) ×3 IMPLANT
SUT SILK 2-0 18XBRD TIE 12 (SUTURE) ×1 IMPLANT
SUT VIC AB 2-0 SH 27 (SUTURE) ×4
SUT VIC AB 2-0 SH 27XBRD (SUTURE) ×2 IMPLANT
SUT VIC AB 3-0 SH 27 (SUTURE)
SUT VIC AB 3-0 SH 27X BRD (SUTURE) IMPLANT
SUTURE EHLN 3-0 FS-10 30 BLK (SUTURE) IMPLANT
SYR 20ML LL LF (SYRINGE) ×4 IMPLANT
SYR TOOMEY 50ML (SYRINGE) ×1 IMPLANT
TAPE MICROFOAM 4IN (TAPE) ×1 IMPLANT
TOWEL OR 17X26 4PK STRL BLUE (TOWEL DISPOSABLE) ×1 IMPLANT
TRAY FOLEY MTR SLVR 16FR STAT (SET/KITS/TRAYS/PACK) ×2 IMPLANT
TRAY FOLEY SLVR 16FR LF STAT (SET/KITS/TRAYS/PACK) ×1 IMPLANT
WATER STERILE IRR 1000ML POUR (IV SOLUTION) ×1 IMPLANT
WATER STERILE IRR 500ML POUR (IV SOLUTION) ×1 IMPLANT

## 2021-10-21 NOTE — Op Note (Signed)
PROCEDURES: Ileostomy take down with stapled Ileoproctostomy EEA Baker Anastomosis 2. Abdominal wall reconstruction with bilateral myocutaneous flaps using a TAR release and  two Psahix Meshes  in  a home plate and diamond configuration polypropylene mesh measuring 30 x 25 cm and 20x 25cms 3. Incisional hernia repair greater 12 cms 4. 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 with endotracheal tube  Assistant: Waylan Boga RNFA (essential due to the complexity of the procedure and to have adequate exposure)  Findings: Tension free anastomosis with excellent perfusion, no evidence of intra-operative leak Large ventral hernia with parastomal hernia with loss of domain Great coverage of defect with reconstruction of  the abd wall   Estimated Blood Loss: 100cc         Drains: 19 FR x 2  ( intra-abdominal and another retrorectus)             Complications: none        Condition: stable  Procedure Details  The patient was seen again in the Holding Room. The benefits, complications, treatment options, and expected outcomes were discussed with the patient. The risks of bleeding, infection, recurrence of symptoms, failure to resolve symptoms,  bowel injury, any of which could require further surgery were reviewed with the patient.   The patient was taken to Operating Room, identified as Jenna [name] and the procedure verified.  A Time Out was held and the above information confirmed. For prepping I went ahead and place 2-0 silk suture at the ileostomy for closure and to contain contamination Generous elliptical incision was created from pubis to xiphoid incorporating the old scar.  The subcutaneous tissue is  divided and the fascia and the hernia sac are identified. The peritoneal cavity was entered.  Extensive adhesions were performed using a combination of electrocautery and Metzenbaum scissors.  There was significant adhesions of the small bowel to the abdominal wall and the omentum to the abdominal wall.    Total of at least 90 minutes of LOA was done to restore appropriate anatomy.  Patient then was turned to the ileostomy site where he was detach from the abdominal wall using electrocautery , no injuries were encountered I Was able to identify the sigmoid stump.  Given that we will get a do an abdominal wall reconstruction I thought the best option for the anastomosis was a Baker anastomosis using EEA stapler. On the ileum side I was able to remove the stitch and placed a dilator at 25 mm was just the right size. Tension there were external to the sigmoid colon with a colotomy was performed.  There was some stool retained and mucus that I was able to push distally.  I inserted the anvil into the sigmoid colon and was able to brought it about 5 cm from the colotomy.  Using 75 mm stapler the sigmoid colon was resected.  LigaSure was used to also resect the mesentery of the sigmoid colon.  Was able to place the EEA stapler via the ileostomy and perform a side-to-side functional L2 and ileal proctoscopy ostomy using a standard 25 mm EEA anastomosis.  The device was deployed without any issues.  Were able to inspect the anastomosis without any leaks and we  obtained 2 intact donuts.  The rest of the ileum was also divided with a 75 stapler in the standard fashion.  I was very happy with anastomosis. 33 blake drain placed in the pelvis next to the anastomosis. Exiting on the Left side.  We irrigated abdominal cavity with at least 3 L of normal saline.  We changed gloves and placed a clean closure tray  we entered the retrorectus space by identifying/ incising the posterior sheath bilaterally. This dissection is  then performed laterally to the linea semilunaris, the most lateral aspect of the retrorectus space, using a combination of both blunt and sharp dissection   The retromuscular space is then bluntly developed to as far as the lateral border of the psoas muscle. The Tranversus was divided in order to release rhe muscle complexes and obtain a tension free repair. The dissection is repeated on the opposite site and  Was carried superiorly to the central tendon of the diaphragm, using a subxiphoid and retrosternal dissection. Dissection also  was developed inferiorly to the retropubic space.   Next, the posterior rectus sheaths are approximated to one another with 0 PDS suture.  Using liposomal Marcaine TAP Block was performed on each side under direct visualization.   I Selected two Phasix meshes 30 x 25 cm and 20x25 c and in a diamond and home plate configuration place it anterior to the peritoneum. The mesh layed very flat on in a corset fashion.  I was very happy with the repair.  A 19 [place] Blake drain was placed in the retrorectus space in the standard fashion.  This retrorectus drain was placed on the right inferior abdominal wall.  Attention Was turned to the anterior sheath.  This was approximated with multiple running 0 PDS sutures.  We made sure that we released some of the tension that was attached to the subcutaneous tissue. The prior ostomy fascia defect was also closed using 0 PDS.   The exit of this drain was to the left upper quadrant.  The wound was irrigated with saline and sterile water.  It was approximated with a staples and a Prevena plus wound VAC was placed in the standard fashion.  Needle and laparotomy count were correct and there were no immediate complications  Caroleen Hamman, MD, FACS

## 2021-10-21 NOTE — Anesthesia Procedure Notes (Signed)
Procedure Name: Intubation Date/Time: 10/27/2021 11:17 AM  Performed by: Tollie Eth, CRNAPre-anesthesia Checklist: Patient identified, Patient being monitored, Timeout performed, Emergency Drugs available and Suction available Patient Re-evaluated:Patient Re-evaluated prior to induction Oxygen Delivery Method: Circle system utilized Preoxygenation: Pre-oxygenation with 100% oxygen Induction Type: IV induction Ventilation: Mask ventilation without difficulty Laryngoscope Size: Mac and 3 Grade View: Grade I Tube type: Oral Tube size: 7.0 mm Number of attempts: 1 Airway Equipment and Method: Stylet and Video-laryngoscopy Placement Confirmation: ETT inserted through vocal cords under direct vision, positive ETCO2 and breath sounds checked- equal and bilateral Secured at: 21 cm Tube secured with: Tape Dental Injury: Teeth and Oropharynx as per pre-operative assessment

## 2021-10-21 NOTE — Interval H&P Note (Signed)
History and Physical Interval Note:  10/16/2021 9:52 AM  Jenna [name]  has presented today for surgery, with the diagnosis of ventral hernia, Colon Cancer.  The various methods of treatment have been discussed with the patient and family. After consideration of risks, benefits and other options for treatment, the patient has consented to  Procedure(s) with comments: COLOSTOMY TAKEDOWN-RNFA to assist (N/A) - provider is requesting 4 hours total for these procedures HERNIA REPAIR VENTRAL ADULT (N/A) REPAIR ABDOMINAL WALL (N/A) as a surgical intervention.  The patient's history has been reviewed, patient examined, no change in status, stable for surgery.  I have reviewed the patient's chart and labs.  Questions were answered to the patient's satisfaction.     [place]

## 2021-10-21 NOTE — Anesthesia Postprocedure Evaluation (Signed)
Anesthesia Post Note  Patient: Jenna [name]  Procedure(s) Performed: COLOSTOMY TAKEDOWN-RNFA to assist [place] ABDOMINAL WALL  Patient location during evaluation: PACU Anesthesia Type: General Level of consciousness: awake and alert Pain management: pain level controlled Vital Signs Assessment: post-procedure vital signs reviewed and stable Respiratory status: spontaneous breathing, nonlabored ventilation, respiratory function stable and patient connected to nasal cannula oxygen Cardiovascular status: blood pressure returned to baseline and stable Postop Assessment: no apparent nausea or vomiting Anesthetic complications: no   No notable events documented.   Last Vitals:  Vitals:   10/09/2021 1730 10/19/2021 1755  BP: (!) 91/58 (!) 77/52  Pulse: 62 62  Resp: 15 18  Temp:  (!) 35.6 C  SpO2: 95% 95%    Last Pain:  Vitals:   10/23/2021 1755  TempSrc: Axillary  PainSc: 5                  Arita Miss

## 2021-10-21 NOTE — Progress Notes (Signed)
   10/11/2021 1755  Assess: MEWS Score  Temp (!) 96.1 F (35.6 C)  BP (!) 77/52  MAP (mmHg) (!) 61  Pulse Rate 62  Resp 18  Level of Consciousness Alert  SpO2 95 %  O2 Device Room Air  Assess: MEWS Score  MEWS Temp 1  MEWS Systolic 2  MEWS Pulse 0  MEWS RR 0  MEWS LOC 0  MEWS Score 3  MEWS Score Color Yellow  Assess: if the MEWS score is Yellow or Red  Were vital signs taken at a resting state? Yes  Focused Assessment No change from prior assessment  Does the patient meet 2 or more of the SIRS criteria? Yes  Does the patient have a confirmed or suspected source of infection? No  MEWS guidelines implemented *See Row Information* Yes  Treat  MEWS Interventions Administered scheduled meds/treatments  Pain Scale 0-10  Pain Score 5  Pain Type Surgical pain  Pain Location Abdomen  Pain Orientation Mid  Pain Descriptors / Indicators Aching  Pain Frequency Constant  Pain Onset Gradual  Patients Stated Pain Goal 1  Pain Intervention(s) Medication (See eMAR)  Multiple Pain Sites No  Take Vital Signs  Increase Vital Sign Frequency  Yellow: Q 2hr X 2 then Q 4hr X 2, if remains yellow, continue Q 4hrs  Escalate  MEWS: Escalate Yellow: discuss with charge nurse/RN and consider discussing with provider and RRT  Notify: Charge Nurse/RN  Name of Charge Nurse/RN Notified misty burns rn  Date Charge Nurse/RN Notified 10/29/2021  Time Charge Nurse/RN Notified 1807  Notify: Provider  Provider Name/Title diego pabon MD  Date Provider Notified 10/12/2021  Time Provider Notified 1807  Method of Notification Page (secure chat)  Notification Reason Critical result  Provider response No new orders  Date of Provider Response  (none at this time)  Time of Provider Response  (none at this time)  Assess: SIRS CRITERIA  SIRS Temperature  1  SIRS Pulse 0  SIRS Respirations  0  SIRS WBC 1  SIRS Score Sum  2   Patient arrived to unit from PACU. Vitals upon arrival were as follows. Patient is  alert and orientet and sipping on warm coffee. Reporting mild pain. MD aware of vitals no new orders at this time

## 2021-10-21 NOTE — Transfer of Care (Signed)
Immediate Anesthesia Transfer of Care Note  Patient: Jenna [name]  Procedure(s) Performed: COLOSTOMY TAKEDOWN-RNFA to assist [place] ABDOMINAL WALL  Patient Location: PACU  Anesthesia Type:General  Level of Consciousness: awake, drowsy and patient cooperative  Airway & Oxygen Therapy: Patient Spontanous Breathing and Patient connected to face mask oxygen  Post-op Assessment: Report given to RN and Post -op Vital signs reviewed and stable  Post vital signs: Reviewed and stable  Last Vitals:  Vitals Value Taken Time  BP 92/60 10/11/2021 1615  Temp    Pulse 77 10/20/2021 1620  Resp 19 10/19/2021 1620  SpO2 96 % 10/23/2021 1620  Vitals shown include unvalidated device data.  Last Pain:  Vitals:   10/15/2021 0807  TempSrc: Temporal  PainSc: 1          Complications: No notable events documented.

## 2021-10-21 NOTE — Anesthesia Preprocedure Evaluation (Signed)
Anesthesia Evaluation  Patient identified by MRN, date of birth, ID band Patient awake    Reviewed: Allergy & Precautions, NPO status , Patient's Chart, lab work & pertinent test results  History of Anesthesia Complications Negative for: history of anesthetic complications  Airway Mallampati: I  TM Distance: >3 FB Neck ROM: Full    Dental  (+) Missing,    Pulmonary neg sleep apnea, neg COPD, Current Smoker and Patient abstained from smoking.,  Pulmonary nodule on CT scan 05/2021   breath sounds clear to auscultation- rhonchi (-) wheezing      Cardiovascular + CAD (on CT scan 05/2021)  (-) Past MI, (-) Cardiac Stents and (-) CABG  Rhythm:Regular Rate:Normal - Systolic murmurs and - Diastolic murmurs H/o HTN   Neuro/Psych neg Seizures  Neuromuscular disease CVA, No Residual Symptoms negative psych ROS   GI/Hepatic Right hepatic lobe liver lesion S/P ileostomy   Endo/Other  neg diabetesHypothyroidism   Renal/GU CRFRenal disease     Musculoskeletal negative musculoskeletal ROS (+)   Abdominal (+) - obese,   Peds  Hematology negative hematology ROS (+)   Anesthesia Other Findings 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   Reproductive/Obstetrics                             Anesthesia Physical  Anesthesia Plan  ASA: 3  Anesthesia Plan: General   Post-op Pain Management: Minimal or no pain anticipated   Induction: Intravenous  PONV Risk Score and Plan: 1 and Dexamethasone and Ondansetron  Airway Management Planned: Oral ETT  Additional Equipment:   Intra-op Plan:   Post-operative Plan: Extubation in OR  Informed Consent: I have reviewed the patients History and Physical, chart, labs and discussed the procedure including the risks, benefits and alternatives for the proposed anesthesia with the patient or authorized representative who has  indicated his/her understanding and acceptance.     Dental advisory given  Plan Discussed with: CRNA and Anesthesiologist  Anesthesia Plan Comments:         Anesthesia Quick Evaluation

## 2021-10-22 ENCOUNTER — Encounter: Payer: Self-pay | Admitting: Surgery

## 2021-10-22 LAB — CBC
HCT: 28.1 % — ABNORMAL LOW (ref 36.0–46.0)
Hemoglobin: 9.1 g/dL — ABNORMAL LOW (ref 12.0–15.0)
MCH: 31.5 pg (ref 26.0–34.0)
MCHC: 32.4 g/dL (ref 30.0–36.0)
MCV: 97.2 fL (ref 80.0–100.0)
Platelets: 149 10*3/uL — ABNORMAL LOW (ref 150–400)
RBC: 2.89 MIL/uL — ABNORMAL LOW (ref 3.87–5.11)
RDW: 15.4 % (ref 11.5–15.5)
WBC: 4.1 10*3/uL (ref 4.0–10.5)
nRBC: 0 % (ref 0.0–0.2)

## 2021-10-22 LAB — BASIC METABOLIC PANEL
Anion gap: 4 — ABNORMAL LOW (ref 5–15)
BUN: 30 mg/dL — ABNORMAL HIGH (ref 8–23)
CO2: 17 mmol/L — ABNORMAL LOW (ref 22–32)
Calcium: 7.9 mg/dL — ABNORMAL LOW (ref 8.9–10.3)
Chloride: 114 mmol/L — ABNORMAL HIGH (ref 98–111)
Creatinine, Ser: 1.89 mg/dL — ABNORMAL HIGH (ref 0.44–1.00)
GFR, Estimated: 27 mL/min — ABNORMAL LOW (ref >60)
Glucose, Bld: 119 mg/dL — ABNORMAL HIGH (ref 70–99)
Potassium: 5.5 mmol/L — ABNORMAL HIGH (ref 3.5–5.1)
Sodium: 135 mmol/L (ref 135–145)

## 2021-10-22 LAB — POTASSIUM: Potassium: 5 mmol/L (ref 3.5–5.1)

## 2021-10-22 MED ORDER — ALBUMIN HUMAN 25 % IV SOLN
25.0000 g | Freq: Once | INTRAVENOUS | Status: AC
  Administered 2021-10-23: 25 g via INTRAVENOUS
  Filled 2021-10-22: qty 100

## 2021-10-22 MED ORDER — ALBUMIN HUMAN 5 % IV SOLN
25.0000 g | Freq: Once | INTRAVENOUS | Status: AC
  Administered 2021-10-22: 25 g via INTRAVENOUS
  Filled 2021-10-22: qty 500

## 2021-10-22 MED ORDER — SODIUM CHLORIDE 0.9 % IV SOLN: INTRAVENOUS | Status: DC | PRN

## 2021-10-22 MED ORDER — LACTATED RINGERS IV SOLN: INTRAVENOUS | Status: DC

## 2021-10-22 MED ORDER — ALVIMOPAN 12 MG PO CAPS
12.0000 mg | ORAL_CAPSULE | Freq: Two times a day (BID) | ORAL | Status: AC
  Administered 2021-10-22: 12 mg via ORAL
  Filled 2021-10-22: qty 1

## 2021-10-22 MED ORDER — LACTATED RINGERS IV BOLUS
1000.0000 mL | Freq: Once | INTRAVENOUS | Status: AC
  Administered 2021-10-22: 1000 mL via INTRAVENOUS

## 2021-10-22 NOTE — TOC Progression Note (Signed)
Transition of Care [facility]) - Progression Note    Patient Details  Name: Jenna [name] MRN: [id number] Date of Birth: 01-04-1941  Transition of Care [facility]) CM/SW [place], RN Phone Number: 10/22/2021, 12:26 PM  Clinical Narrative:      Transition of Care [facility]) Screening Note   Patient Details  Name: Jenna [name] Date of Birth: Mar 26, 1941   Transition of Care [facility]) CM/SW Contact:    Conception Oms, RN Phone Number: 10/22/2021, 12:26 PM    Transition of Care Department [facility]) has reviewed patient and no TOC needs have been identified at this time. We will continue to monitor patient advancement through interdisciplinary progression rounds. If new patient transition needs arise, please place a TOC consult.         Expected Discharge Plan and Services                                                 Social Determinants of Health (SDOH) Interventions    Readmission Risk Interventions     No data to display

## 2021-10-22 NOTE — Progress Notes (Signed)
[facility] Day(s): 1.   Post op day(s): 1 Day Post-Op.   Interval History:  Patient seen and examined No acute events or new complaints overnight.  BP has been soft Patient reports she is doing well; no pain No fever, chills, nausea, emesis She is without leukocytosis; WBC 4.1K Hgb 9.1; likely dilutional in nature She does have AKI this morning; sCr - 1.89; UO - 370 ccs Hyperkalemia to 5.5 Surgical Drains:  - LLQ: 160 ccs; serosanguinous  - RLQ (retrorectus): 260 ccs; serosanguinous She is on a CLD; tolerating No flatus Has not ambulated  Vital signs in last 24 hours: [min-max] current  Temp:  [96.1 F (35.6 C)-98.1 F (36.7 C)] 98.1 F (36.7 C) (07/21 0324) Pulse Rate:  [62-80] 74 (07/21 0324) Resp:  [11-25] 16 (07/21 0324) BP: (72-100)/(50-64) 91/57 (07/21 0324) SpO2:  [91 %-100 %] 98 % (07/21 0324)     Height: 5\' 4"  (162.6 cm) Weight: 71.7 kg BMI (Calculated): 27.11   Intake/Output last 2 shifts:  07/20 0701 - 07/21 0700 In: 2200 [I.V.:2000; IV Piggyback:200] Out: 890 [Urine:370; Drains:420; Blood:100]   Physical Exam:  Constitutional: alert, cooperative and no distress  Respiratory: breathing non-labored at rest  Cardiovascular: regular rate and sinus rhythm  Gastrointestinal: Soft, non-tender, non-distended, no rebound/guarding. Drains in RLQ/LLQ; both serosanguinous.  Genitourinary; Foley in place; good UO Integumentary: Prevena in place; good seal  Labs:     Latest Ref Rng & Units 10/22/2021    5:12 AM 10/24/2021    4:56 PM 09/28/2021    8:40 AM  CBC  WBC 4.0 - 10.5 K/uL 4.1  2.9  6.2   Hemoglobin 12.0 - 15.0 g/dL 9.1  11.8  10.8   Hematocrit 36.0 - 46.0 % 28.1  37.3  33.1   Platelets 150 - 400 K/uL 149  189  187       Latest Ref Rng & Units 10/22/2021    5:12 AM 10/19/2021    4:56 PM 09/28/2021    8:40 AM  CMP  Glucose 70 - 99 mg/dL 119   101   BUN 8 - 23 mg/dL 30   21   Creatinine 0.44 - 1.00  mg/dL 1.89  1.62  1.06   Sodium 135 - 145 mmol/L 135   136   Potassium 3.5 - 5.1 mmol/L 5.5   4.2   Chloride 98 - 111 mmol/L 114   107   CO2 22 - 32 mmol/L 17   21   Calcium 8.9 - 10.3 mg/dL 7.9   8.8   Total Protein 6.5 - 8.1 g/dL   6.9   Total Bilirubin 0.3 - 1.2 mg/dL   0.3   Alkaline Phos 38 - 126 U/L   41   AST 15 - 41 U/L   23   ALT 0 - 44 U/L   14      Imaging studies: No new pertinent imaging studies   Assessment/Plan:  81 y.o. female 1 Day Post-Op s/p ileostomy takedown, abdominal wall reconstruction, and placement of Prevena vac.   - Continue CLD today  - Continue foley catheter  - Continue Entereg  - Continue IVF resuscitation; AKI; monitor renal function - Correct hyperkalemia - Monitor abdominal examination; on-going bowel function   - Continue surgical drain; monitor output - Continue Prevena; usual 7 days   - Pain control prn; antiemetics prn - Mobilization as tolerated; low threshold to engage PT   All of the above findings  and recommendations were discussed with the patient, and the medical team, and all of patient's questions were answered to his expressed satisfaction.  -- Edison Simon, PA-C [facility] 10/22/2021, 8:20 AM M-F: 7am - 4pm

## 2021-10-22 NOTE — Progress Notes (Signed)
Mobility Specialist - Progress Note   10/22/21 1400  Mobility  Activity Refused mobility     Pt sleeping on arrival, awakened by voice. Family at bedside. Pt was initially motivated for activity, but politely declined at attempt d/t recent pain medication given and wanting to sleep at this time. Will attempt session another date/time.    Kathee Delton Mobility Specialist 10/22/21, 2:05 PM

## 2021-10-23 ENCOUNTER — Inpatient Hospital Stay: Payer: Medicare HMO

## 2021-10-23 DIAGNOSIS — E039 Hypothyroidism, unspecified: Secondary | ICD-10-CM | POA: Diagnosis not present

## 2021-10-23 DIAGNOSIS — J9811 Atelectasis: Secondary | ICD-10-CM | POA: Diagnosis not present

## 2021-10-23 DIAGNOSIS — N179 Acute kidney failure, unspecified: Secondary | ICD-10-CM | POA: Diagnosis present

## 2021-10-23 DIAGNOSIS — E871 Hypo-osmolality and hyponatremia: Secondary | ICD-10-CM | POA: Insufficient documentation

## 2021-10-23 DIAGNOSIS — I480 Paroxysmal atrial fibrillation: Secondary | ICD-10-CM | POA: Diagnosis present

## 2021-10-23 DIAGNOSIS — N189 Chronic kidney disease, unspecified: Secondary | ICD-10-CM

## 2021-10-23 DIAGNOSIS — Z9889 Other specified postprocedural states: Secondary | ICD-10-CM

## 2021-10-23 LAB — CBC
HCT: 24.7 % — ABNORMAL LOW (ref 36.0–46.0)
Hemoglobin: 8.1 g/dL — ABNORMAL LOW (ref 12.0–15.0)
MCH: 31.4 pg (ref 26.0–34.0)
MCHC: 32.8 g/dL (ref 30.0–36.0)
MCV: 95.7 fL (ref 80.0–100.0)
Platelets: 118 10*3/uL — ABNORMAL LOW (ref 150–400)
RBC: 2.58 MIL/uL — ABNORMAL LOW (ref 3.87–5.11)
RDW: 15.5 % (ref 11.5–15.5)
WBC: 4.6 10*3/uL (ref 4.0–10.5)
nRBC: 0 % (ref 0.0–0.2)

## 2021-10-23 LAB — PROCALCITONIN: Procalcitonin: 12.58 ng/mL

## 2021-10-23 LAB — TYPE AND SCREEN
ABO/RH(D): O POS
Antibody Screen: NEGATIVE

## 2021-10-23 LAB — BASIC METABOLIC PANEL
Anion gap: 8 (ref 5–15)
BUN: 26 mg/dL — ABNORMAL HIGH (ref 8–23)
CO2: 15 mmol/L — ABNORMAL LOW (ref 22–32)
Calcium: 8.1 mg/dL — ABNORMAL LOW (ref 8.9–10.3)
Chloride: 107 mmol/L (ref 98–111)
Creatinine, Ser: 1.97 mg/dL — ABNORMAL HIGH (ref 0.44–1.00)
GFR, Estimated: 26 mL/min — ABNORMAL LOW (ref >60)
Glucose, Bld: 102 mg/dL — ABNORMAL HIGH (ref 70–99)
Potassium: 4.5 mmol/L (ref 3.5–5.1)
Sodium: 130 mmol/L — ABNORMAL LOW (ref 135–145)

## 2021-10-23 MED ORDER — SODIUM CHLORIDE 0.9% FLUSH: 10.0000 mL | INTRAVENOUS | Status: DC | PRN

## 2021-10-23 MED ORDER — AMIODARONE HCL IN DEXTROSE 360-4.14 MG/200ML-% IV SOLN
60.0000 mg/h | INTRAVENOUS | Status: DC
  Administered 2021-10-23: 60 mg/h via INTRAVENOUS
  Filled 2021-10-23 (×2): qty 200

## 2021-10-23 MED ORDER — ENOXAPARIN SODIUM 80 MG/0.8ML IJ SOSY
1.0000 mg/kg | PREFILLED_SYRINGE | INTRAMUSCULAR | Status: DC
  Administered 2021-10-23 – 2021-10-25 (×3): 72.5 mg via SUBCUTANEOUS
  Filled 2021-10-23 (×3): qty 0.8
  Filled 2021-10-23: qty 0.72

## 2021-10-23 MED ORDER — CHLORHEXIDINE GLUCONATE CLOTH 2 % EX PADS
6.0000 | MEDICATED_PAD | Freq: Every day | CUTANEOUS | Status: DC
  Administered 2021-10-26 – 2021-11-01 (×7): 6 via TOPICAL

## 2021-10-23 MED ORDER — SODIUM CHLORIDE 0.9% FLUSH
10.0000 mL | Freq: Two times a day (BID) | INTRAVENOUS | Status: DC
  Administered 2021-10-23 – 2021-10-27 (×8): 10 mL
  Administered 2021-10-28: 20 mL
  Administered 2021-10-28: 10 mL
  Administered 2021-10-29: 20 mL
  Administered 2021-10-29 – 2021-11-01 (×5): 10 mL

## 2021-10-23 MED ORDER — AMIODARONE HCL IN DEXTROSE 360-4.14 MG/200ML-% IV SOLN
30.0000 mg/h | INTRAVENOUS | Status: DC
  Administered 2021-10-24: 30 mg/h via INTRAVENOUS
  Filled 2021-10-23 (×2): qty 200

## 2021-10-23 NOTE — Progress Notes (Signed)
CC: s/p abd  wall reconstruction Subjective: Pressure were soft again and they responded to appropriate crystalloid and colloid administration Feels better.  Urine output has been preserved. Right urine output Small bowel movement  Objective: Vital signs in last 24 hours: Temp:  [98.3 F (36.8 C)-99.1 F (37.3 C)] 99.1 F (37.3 C) (07/22 0728) Pulse Rate:  [84-97] 97 (07/22 0728) Resp:  [14-18] 18 (07/22 0728) BP: (85-111)/(48-55) 111/55 (07/22 0728) SpO2:  [92 %-100 %] 92 % (07/22 0728) Last BM Date : 10/24/2021  Intake/Output from previous day: 07/21 0701 - 07/22 0700 In: 3260 [P.O.:1560; IV Piggyback:1700] Out: 2570 [Urine:2050; Drains:520] Intake/Output this shift: No intake/output data recorded.  Physical exam:  Nad abD: SOFT, wound VAC in place.  Drains with mostly serous output from both areas Great,clear urine within the Foley  Lab Results: CBC  Recent Labs    10/22/21 0512 10/23/21 0635  WBC 4.1 4.6  HGB 9.1* 8.1*  HCT 28.1* 24.7*  PLT 149* 118*   BMET Recent Labs    10/22/21 0512 10/22/21 1532 10/23/21 0914  NA 135  --  130*  K 5.5* 5.0 4.5  CL 114*  --  107  CO2 17*  --  15*  GLUCOSE 119*  --  102*  BUN 30*  --  26*  CREATININE 1.89*  --  1.97*  CALCIUM 7.9*  --  8.1*   PT/INR No results for input(s): "LABPROT", "INR" in the last 72 hours. ABG No results for input(s): "PHART", "HCO3" in the last 72 hours.  Invalid input(s): "PCO2", "PO2"  Studies/Results: No results found.  Anti-infectives: Anti-infectives (From admission, onward)    Start     Dose/Rate Route Frequency Ordered Stop   10/03/2021 2200  cefoTEtan (CEFOTAN) 2 g in sodium chloride 0.9 % 100 mL IVPB        2 g 200 mL/hr over 30 Minutes Intravenous Every 8 hours 10/29/2021 1739 10/22/21 0639   10/05/2021 1455  sodium chloride 0.9 % with cefoTEtan (CEFOTAN) ADS Med       Note to Pharmacy: Register, Santiago Glad A: cabinet override      10/25/2021 1455 10/05/2021 1549   10/29/2021 0816   sodium chloride 0.9 % with cefoTEtan (CEFOTAN) ADS Med       Note to Pharmacy: Herby Abraham W: cabinet override      10/29/2021 0816 10/10/2021 1139   10/30/2021 0730  cefoTEtan (CEFOTAN) 2 g in sodium chloride 0.9 % 100 mL IVPB        2 g 200 mL/hr over 30 Minutes Intravenous On call to O.R. 10/17/2021 0728 10/20/2021 1527       Assessment/Plan:  Doing well.  Acidosis and acute kidney injury from major abdominal operation and likely intravascular depletion.  We seem to be [place] with her intravascular space and creatinine right now is a reflection of prior events. No evidence of active bleeding or anastomotic leak. DC Foley today Repeat labs in the morning Mobilize today  Caroleen Hamman, MD, [facility]  10/23/2021

## 2021-10-23 NOTE — Consult Note (Signed)
Initial Consultation Note   Patient: Jenna [name] SEG:[id number] DOB: March 27, 1941 PCP: [facility] DOA: 10/11/2021 DOS: the patient was seen and examined on 10/23/2021 Primary service: Jules Husbands, MD  Referring physician: Dr Dahlia Byes, [place] Reason for consult: A-Fib with RVR  Assessment/Plan: Assessment and Plan: Paroxysmal atrial fibrillation with RVR EKG showed A-fib with RVR Patient was transferred to stepdown unit Continue IV amiodarone drip with plan to transition to oral medication in the morning if patient continues to be in atrial fibrillation Continue Lovenox therapeutic dose with plan to transition to [place] if patient continues to be in atrial fibrillation in the morning Continue telemetry  Acute kidney injury on CKD stage 3A Metabolic acidosis Creatinine 1.97 (baseline creatinine at 0.9 - 1.1) Continue gentle hydration Renally adjust medications, avoid nephrotoxic agents/dehydration/hypotension  Hyponatremia Na 130, continue IV hydration  ?Diarrhea Patient complained of loose stools which started today after several months of constipation Monitor and consider if infectious - to do C. difficile and GI stool panel  ?  Atelectasis/Pneumonia per chest x-ray Patient has no elevated WBC, shortness of breath, fever Procalcitonin will be done prior to considering antibiotics for pneumonia Continue incentive telemetry  Hypothyroidism Continue Synthroid   TRH will continue to follow the patient.  HPI: Jenna [name] is a 82 y.o. female with past medical history of colon cancer, ventral hernia, hypothyroidism, hyperlipidemia who was admitted on 10/06/2021 and underwent colostomy reversal and abd wall reconstruction on 10/18/2021 (POD 3) who was being managed by the surgical team suddenly developed paroxysmal atrial fibrillation and TRH was consulted to help in management of the arrhythmia. At bedside, patient was not in any acute distress, family at  bedside.  She denies any chest pain, shortness of breath, headache, fever, chills, nausea, vomiting other than liquid stools which started today after many months of constipation.  Work-up done today showed normocytic anemia, sodium 130, potassium 4.4, chloride 107, bicarb 15, glucose 102, BUN 26, creatinine 1.97 (baseline creatinine at 1.0). Chest x-ray showed Cardiomegaly. There are no signs of pulmonary edema. Radiograph increased markings are seen in medial right lower lung fields suggesting atelectasis/pneumonia Crystalloid and colloid administration was provided due to soft blood pressure   Review of Systems: As mentioned in the history of present illness. All other systems reviewed and are negative. Past Medical History:  Diagnosis Date   BRCA negative 03/2020   MyRisk neg except MSH3 VUS   Colon cancer ([place])    Family history of breast cancer    IBIS=4.4%/riskscore=2.9%   Gout    Hyperlipidemia    Hypertension    Scarlet fever    Stroke ([place]) 05/2019   Thyroid disease    Past Surgical History:  Procedure Laterality Date   ABDOMINAL WALL DEFECT REPAIR N/A 10/13/2021   Procedure: REPAIR ABDOMINAL WALL;  Surgeon: Jules Husbands, MD;  Location: [facility] ORS;  Service: General;  Laterality: N/A;   APPLICATION OF WOUND VAC  11/16/2020   Procedure: APPLICATION OF WOUND VAC;  Surgeon: Fredirick Maudlin, MD;  Location: [place] [facility];  Service: General;;  Serial # [id number]   BREAST BIOPSY Right 1978   neg   CHOLECYSTECTOMY     COLECTOMY WITH COLOSTOMY CREATION/HARTMANN PROCEDURE N/A 11/16/2020   Procedure: SUBTOTAL COLECTOMY WITH ILEOSTOMY CREATION;  Surgeon: Fredirick Maudlin, MD;  Location: [facility] ORS;  Service: General;  Laterality: N/A;   COLOSTOMY TAKEDOWN N/A 10/13/2021   Procedure: COLOSTOMY TAKEDOWN-RNFA to assist;  Surgeon: Jules Husbands, MD;  Location: [facility] ORS;  Service:  General;  Laterality: N/A;  provider is requesting 4 hours total for these procedures   FLEXIBLE SIGMOIDOSCOPY N/A  09/09/2021   Procedure: FLEXIBLE SIGMOIDOSCOPY;  Surgeon: Lin Landsman, MD;  Location: [facility] ENDOSCOPY;  Service: Gastroenterology;  Laterality: N/A;   FLEXIBLE SIGMOIDOSCOPY N/A 09/10/2021   Procedure: FLEXIBLE SIGMOIDOSCOPY;  Surgeon: Lin Landsman, MD;  Location: [facility] ENDOSCOPY;  Service: Gastroenterology;  Laterality: N/A;   LAPAROTOMY N/A 11/16/2020   Procedure: EXPLORATORY LAPAROTOMY;  Surgeon: Fredirick Maudlin, MD;  Location: [facility] ORS;  Service: General;  Laterality: N/A;   PORTACATH PLACEMENT N/A 06/03/2021   Procedure: INSERTION PORT-A-CATH;  Surgeon: Jules Husbands, MD;  Location: [facility] ORS;  Service: General;  Laterality: N/A;  Provider requesting 1 hour / 60 minutes for procedure.   VENTRAL HERNIA REPAIR N/A 10/06/2021   Procedure: HERNIA REPAIR VENTRAL ADULT;  Surgeon: Jules Husbands, MD;  Location: [facility] ORS;  Service: General;  Laterality: N/A;   Social History:  reports that she has been smoking cigarettes. She has been smoking an average of .25 packs per day. She has never used smokeless tobacco. She reports that she does not drink alcohol and does not use drugs.  No Known Allergies  Family History  Problem Relation Age of Onset   Breast cancer Maternal Grandmother 32   Breast cancer Cousin 73    Prior to Admission medications   Medication Sig Start Date End Date Taking? Authorizing Provider  allopurinol (ZYLOPRIM) 100 MG tablet Take 100 mg by mouth every evening.   Yes [provider]  aspirin EC 81 MG tablet Take 81 mg by mouth every evening.   Yes [provider]  Calcium Carb-Cholecalciferol (CALCIUM 600+D) 600-20 MG-MCG TABS Take 1 tablet by mouth daily.   Yes [provider]  levothyroxine (SYNTHROID) 50 MCG tablet Take 50 mcg by mouth daily. 04/30/19  Yes [provider]  rosuvastatin (CRESTOR) 40 MG tablet Take 40 mg by mouth every evening.   Yes [provider]  lidocaine-prilocaine (EMLA) cream Apply small amount  over port site 2 hours prior to it being accessed and cover with plastic each time port is to be accessed 06/03/21   Cammie Sickle, MD    Physical Exam: Vitals:   10/23/21 0259 10/23/21 0728 10/23/21 1540 10/23/21 1809  BP: (!) 106/50 (!) 111/55 124/67 113/60  Pulse: 88 97 (!) 120 (!) 135  Resp: 14 18 18 18   Temp: 98.3 F (36.8 C) 99.1 F (37.3 C) 100.2 F (37.9 C) 99.5 F (37.5 C)  TempSrc: Oral Oral Oral Oral  SpO2: 100% 92% 95% 95%  Weight:      Height:       General: Elderly female. Awake and alert and oriented x3. Not in any acute distress.  HEENT: NCAT.  PERRLA. EOMI. Sclerae anicteric Neck: Neck supple without lymphadenopathy. No carotid bruits. No masses palpated.  Cardiovascular: Tachycardia.  Irregular rate with normal S1-S2 sounds. No murmurs, rubs or gallops auscultated. No JVD.  Respiratory: Clear breath sounds.  No accessory muscle use. Abdomen: Soft, nontender,  Drains in RLQ/LLQ, wound VAC in place.   Skin: No rashes, lesions, or ulcerations.  Dry, warm to touch. Musculoskeletal:  2+ dorsalis pedis and radial pulses. Good ROM.  No contractures  Psychiatric: Intact judgment and insight.  Mood appropriate to current condition. Neurologic: No focal neurological deficits. Strength is 5/5 x 4.  CN II - XII grossly intact.  Data Reviewed:  EKG personally reviewed showed A-fib with RVR  Family Communication: Family at bedside (all questions answered to satisfaction)  Primary team communication:  Thank you very much for involving [place] in the care of your patient.  Author: Bernadette Hoit, DO 10/23/2021 8:03 PM  For on call review [email or web address].

## 2021-10-23 NOTE — Consult Note (Signed)
ANTICOAGULATION CONSULT NOTE - Initial Consult  Pharmacy Consult for Enoxaparin Indication: atrial fibrillation  No Known Allergies  Patient Measurements: Height: _0  (162.6 cm) Weight: 71.7 kg (158 lb) IBW/kg (Calculated) : 54.7  Vital Signs: Temp: 99.5 F (37.5 C) (07/22 1809) Temp Source: Oral (07/22 1809) BP: 113/60 (07/22 1809) Pulse Rate: 135 (07/22 1809)  Labs: Recent Labs    10/27/2021 1656 10/22/21 0512 10/23/21 0635 10/23/21 0914  HGB 11.8* 9.1* 8.1*  --   HCT 37.3 28.1* 24.7*  --   PLT 189 149* 118*  --   CREATININE  --  1.89*  --  1.97*    Estimated Creatinine Clearance: 22.9 mL/min (A) (by C-G formula based on SCr of 1.97 mg/dL (H)).   Medical History: Past Medical History:  Diagnosis Date   BRCA negative 03/2020   MyRisk neg except MSH3 VUS   Colon cancer [facility])    Family history of breast cancer    IBIS=4.4%/riskscore=2.9%   Gout    Hyperlipidemia    Hypertension    Scarlet fever    Stroke [facility]) 05/2019   Thyroid disease     Assessment: Patient status post ileostomy. Medical history includes Afib,and  AKI. Pharmacy consulted to dose enoxaparin for A.fib. Chads-Vasc score of 6 with hx os stroke.   Plan:  Start Enoxaparin 13m/kg every 24 hours  Will plan to check anti-Xa level if plan duration is more than 5 days  Gayatri Teasdale [name]-Guzman PharmD, BCPS 10/23/2021 9:54 PM

## 2021-10-23 NOTE — Progress Notes (Signed)
Called HR 120s, EKG showing a fib w rvr, Consult placed to hospitalist. CXR no evidence of free air. Placed on telemetry Further recs per hospitalist

## 2021-10-23 NOTE — Progress Notes (Signed)
Patient noted to be tachy hR 120's and low grade tempt 100.2. Notified provider, Ekg performed with shows afib with rvr. Hospitalist consulted patient on yellow muse, charge nurse aware. Telelmetry in place, follow yellow muse protocol. Awaiting hospitalist visit.

## 2021-10-24 ENCOUNTER — Inpatient Hospital Stay
Admission: RE | Admit: 2021-10-24 | Discharge: 2021-10-24 | Disposition: A | Discharge status: Home / Self Care | Payer: Medicare HMO | Source: Home / Self Care | Attending: Surgery | Admitting: Surgery

## 2021-10-24 ENCOUNTER — Inpatient Hospital Stay (HOSPITAL_COMMUNITY)
Admission: RE | Admit: 2021-10-24 | Discharge: 2021-10-24 | Disposition: A | Discharge status: Home / Self Care | Payer: Medicare HMO | Source: Home / Self Care | Attending: Surgery | Admitting: Surgery

## 2021-10-24 DIAGNOSIS — I10 Essential (primary) hypertension: Secondary | ICD-10-CM | POA: Diagnosis not present

## 2021-10-24 DIAGNOSIS — C787 Secondary malignant neoplasm of liver and intrahepatic bile duct: Secondary | ICD-10-CM | POA: Diagnosis present

## 2021-10-24 DIAGNOSIS — E663 Overweight: Secondary | ICD-10-CM | POA: Diagnosis present

## 2021-10-24 DIAGNOSIS — I4891 Unspecified atrial fibrillation: Secondary | ICD-10-CM

## 2021-10-24 DIAGNOSIS — R9431 Abnormal electrocardiogram [ECG] [EKG]: Secondary | ICD-10-CM | POA: Diagnosis not present

## 2021-10-24 DIAGNOSIS — N179 Acute kidney failure, unspecified: Secondary | ICD-10-CM | POA: Diagnosis not present

## 2021-10-24 DIAGNOSIS — Z9889 Other specified postprocedural states: Secondary | ICD-10-CM | POA: Diagnosis not present

## 2021-10-24 DIAGNOSIS — C189 Malignant neoplasm of colon, unspecified: Secondary | ICD-10-CM | POA: Diagnosis present

## 2021-10-24 LAB — CBC
HCT: 27.6 % — ABNORMAL LOW (ref 36.0–46.0)
Hemoglobin: 9.3 g/dL — ABNORMAL LOW (ref 12.0–15.0)
MCH: 32.2 pg (ref 26.0–34.0)
MCHC: 33.7 g/dL (ref 30.0–36.0)
MCV: 95.5 fL (ref 80.0–100.0)
Platelets: 148 10*3/uL — ABNORMAL LOW (ref 150–400)
RBC: 2.89 MIL/uL — ABNORMAL LOW (ref 3.87–5.11)
RDW: 15.8 % — ABNORMAL HIGH (ref 11.5–15.5)
WBC: 8 10*3/uL (ref 4.0–10.5)
nRBC: 0 % (ref 0.0–0.2)

## 2021-10-24 LAB — COMPREHENSIVE METABOLIC PANEL
ALT: 13 U/L (ref 0–44)
AST: 16 U/L (ref 15–41)
Albumin: 2.9 g/dL — ABNORMAL LOW (ref 3.5–5.0)
Alkaline Phosphatase: 46 U/L (ref 38–126)
Anion gap: 9 (ref 5–15)
BUN: 20 mg/dL (ref 8–23)
CO2: 19 mmol/L — ABNORMAL LOW (ref 22–32)
Calcium: 8.5 mg/dL — ABNORMAL LOW (ref 8.9–10.3)
Chloride: 105 mmol/L (ref 98–111)
Creatinine, Ser: 1.61 mg/dL — ABNORMAL HIGH (ref 0.44–1.00)
GFR, Estimated: 33 mL/min — ABNORMAL LOW (ref >60)
Glucose, Bld: 117 mg/dL — ABNORMAL HIGH (ref 70–99)
Potassium: 3.9 mmol/L (ref 3.5–5.1)
Sodium: 133 mmol/L — ABNORMAL LOW (ref 135–145)
Total Bilirubin: 0.4 mg/dL (ref 0.3–1.2)
Total Protein: 5.7 g/dL — ABNORMAL LOW (ref 6.5–8.1)

## 2021-10-24 LAB — MAGNESIUM: Magnesium: 2.2 mg/dL (ref 1.7–2.4)

## 2021-10-24 LAB — PROTIME-INR
INR: 1.5 — ABNORMAL HIGH (ref 0.8–1.2)
Prothrombin Time: 18.2 seconds — ABNORMAL HIGH (ref 11.4–15.2)

## 2021-10-24 LAB — PHOSPHORUS: Phosphorus: 3.1 mg/dL (ref 2.5–4.6)

## 2021-10-24 MED ORDER — LOPERAMIDE HCL 2 MG PO CAPS
4.0000 mg | ORAL_CAPSULE | Freq: Three times a day (TID) | ORAL | Status: DC
  Administered 2021-10-24 – 2021-10-26 (×9): 4 mg via ORAL
  Filled 2021-10-24 (×9): qty 2

## 2021-10-24 MED ORDER — DILTIAZEM HCL 30 MG PO TABS
30.0000 mg | ORAL_TABLET | Freq: Three times a day (TID) | ORAL | Status: DC
  Administered 2021-10-24: 30 mg via ORAL
  Filled 2021-10-24: qty 1

## 2021-10-24 MED ORDER — PERFLUTREN LIPID MICROSPHERE
1.0000 mL | INTRAVENOUS | Status: AC | PRN
  Administered 2021-10-24: 2.5 mL via INTRAVENOUS

## 2021-10-24 MED ORDER — SODIUM CHLORIDE 0.9 % IV SOLN: INTRAVENOUS | Status: DC

## 2021-10-24 MED ORDER — FUROSEMIDE 10 MG/ML IJ SOLN
20.0000 mg | Freq: Once | INTRAMUSCULAR | Status: AC
Start: 2021-10-24 — End: 2021-10-24
  Administered 2021-10-24: 20 mg via INTRAVENOUS
  Filled 2021-10-24: qty 2

## 2021-10-24 MED ORDER — DILTIAZEM HCL 30 MG PO TABS
60.0000 mg | ORAL_TABLET | Freq: Four times a day (QID) | ORAL | Status: DC
  Administered 2021-10-24 (×2): 60 mg via ORAL
  Filled 2021-10-24 (×2): qty 2

## 2021-10-24 MED ORDER — AMIODARONE HCL 200 MG PO TABS
200.0000 mg | ORAL_TABLET | Freq: Every day | ORAL | Status: DC
Start: 2021-10-24 — End: 2021-10-25
  Administered 2021-10-24: 200 mg via ORAL
  Filled 2021-10-24: qty 1

## 2021-10-24 NOTE — Progress Notes (Signed)
POD # 3 A fib RVR xfer stepdown and placed on amio Cardizem started No rate controlled Multiple BM ( expected since she does not have 2/3 of her colon). Tolerating PO No bleeding Creat improving Good UO Some hypotensiom Pending echo  PE NAD Abd: soft, prevena in place drains serous, no peritonitis  A/P A fib appreciate hospitalist assistance May need to hold off on cardizem given labile bp Added imodium due to diarrhea Lovenox therapeutic per hospital ist, would be caution since she does have high risks bleeding , on the other hand she did have a stroke 2021 [facility] is fragile Extensive discussion w family held explaining in detail her situation Baby dose of lasix to decrease atrial stimulation May xfer to tele if ok w hospitalist Will wait until they have physically evaluate the pt

## 2021-10-24 NOTE — Assessment & Plan Note (Signed)
Continue statin. 

## 2021-10-24 NOTE — Progress Notes (Signed)
*  PRELIMINARY RESULTS* Echocardiogram 2D Echocardiogram has been performed. Definity IV ultrasound imaging agent used on this study.  Claretta Fraise 10/24/2021, 4:12 PM

## 2021-10-24 NOTE — Assessment & Plan Note (Addendum)
Known history of adenocarcinoma of the colon causing large bowel obstruction requiring emergent laparotomy and subtotal colectomy with bowel perforation.  Recent PET scan found metastases to liver and lung.  Patient following with oncology as outpatient.

## 2021-10-24 NOTE — Progress Notes (Signed)
1000 Patient up to chair without issues. 1100 Patient incontinent of urine and stool while working with PT. 1200 Diet changed to soft. Patient ate well. Wants to remain in chair.

## 2021-10-24 NOTE — Evaluation (Signed)
Physical Therapy Evaluation Patient Details Name: Jenna [name] MRN: [id number] DOB: 09/14/1940 Today's Date: 10/24/2021  History of Present Illness  Jenna [name] is a 81 year old female with known history of adenocarcinoma of the descending colon causing large bowel obstruction requiring emergent laparotomy and subtotal colectomy with end ileostomy by Dr. Celine Ahr on August 2022, she did Perforate.  She had 29 nodes that were negative for metastatic disease.  She has been followed by oncology and recent PET/CT is consistent with metastatic disease to the liver.  There is also lung lesion c/w metastasis. Patient is S/P ileostomy take down, hernia repair and abdominal wall reconstruction 10/22/2021.   Clinical Impression  Patient agreeable to PT assessment. She requires cues and supervision for sit to stand. She is able to take steps with RW and min A of 2 to [facility] and back to recliner. Attempted to ambulate more once cleaned up but was limited by dizziness. Patient will continue to benefit from skilled PT to improve mobility, strength and safety.         Recommendations for follow up therapy are one component of a multi-disciplinary discharge planning process, led by the attending physician.  Recommendations may be updated based on patient status, additional functional criteria and insurance authorization.  Follow Up Recommendations Home health PT      Assistance Recommended at Discharge Frequent or constant Supervision/Assistance  Patient can return home with the following  A little help with walking and/or transfers;A little help with bathing/dressing/bathroom;Help with stairs or ramp for entrance;Assist for transportation;Assistance with cooking/housework    Equipment Recommendations None recommended by PT  Recommendations for Other Services       Functional Status Assessment Patient has had a recent decline in their functional status and demonstrates the ability to make significant improvements  in function in a reasonable and predictable amount of time.     Precautions / Restrictions Precautions Precautions: Fall Restrictions Weight Bearing Restrictions: No      Mobility  Bed Mobility               General bed mobility comments: patient in recliner, NT    Transfers Overall transfer level: Needs assistance Equipment used: Rolling walker (2 wheels) Transfers: Sit to/from Stand Sit to Stand: Min guard           General transfer comment: cues for hand placement    Ambulation/Gait Ambulation/Gait assistance: Min guard, Min assist Gait Distance (Feet): 3 Feet Assistive device: Rolling walker (2 wheels) Gait Pattern/deviations: Step-to pattern, Decreased step length - right, Decreased step length - left, Shuffle Gait velocity: decreased     General Gait Details: patient able to take some steps forward and pivoting steps to [facility]. Limited by urinating and loose stool with mobility as well as dizziness. ( BP low 92/52)  Stairs            Wheelchair Mobility    Modified Rankin (Stroke Patients Only)       Balance Overall balance assessment: Needs assistance Sitting-balance support: Feet supported Sitting balance-Leahy Scale: Good     Standing balance support: Bilateral upper extremity supported, During functional activity, Reliant on assistive device for balance Standing balance-Leahy Scale: Fair                               Pertinent Vitals/Pain Pain Assessment Pain Assessment: Faces Faces Pain Scale: Hurts little more Pain Location: abdomen with movement Pain Descriptors / Indicators: Discomfort, Grimacing, Guarding, Sore  Pain Intervention(s): Monitored during session    Home Living Family/patient expects to be discharged to:: Private residence Living Arrangements: Spouse/significant other;Children Available Help at Discharge: Family;Available 24 hours/day Type of Home: House Home Access: Stairs to enter Entrance  Stairs-Rails: Right Entrance Stairs-Number of Steps: 2   Home Layout: Able to live on main level with bedroom/bathroom Home Equipment: Conservation officer, nature (2 wheels)      Prior Function Prior Level of Function : Independent/Modified Independent             Mobility Comments: was using walker prior to admission ADLs Comments: independent     Hand Dominance        Extremity/Trunk Assessment   Upper Extremity Assessment Upper Extremity Assessment: Overall WFL for tasks assessed    Lower Extremity Assessment Lower Extremity Assessment: Generalized weakness    Cervical / Trunk Assessment Cervical / Trunk Assessment: Normal  Communication   Communication: No difficulties  Cognition Arousal/Alertness: Awake/alert Behavior During Therapy: WFL for tasks assessed/performed Overall Cognitive Status: Within Functional Limits for tasks assessed                                          General Comments      Exercises     Assessment/Plan    PT Assessment Patient needs continued PT services  PT Problem List Decreased strength;Decreased mobility;Decreased activity tolerance;Decreased balance;Pain;Decreased safety awareness       PT Treatment Interventions DME instruction;Therapeutic exercise;Gait training;Balance training;Stair training;Functional mobility training;Therapeutic activities;Patient/family education    PT Goals (Current goals can be found in the Care Plan section)  Acute Rehab PT Goals Patient Stated Goal: to return home PT Goal Formulation: With patient/family Time For Goal Achievement: 11/07/21 Potential to Achieve Goals: Good    Frequency Min 2X/week     Co-evaluation               AM-PAC PT "6 Clicks" Mobility  Outcome Measure Help needed turning from your back to your side while in a flat bed without using bedrails?: A Little Help needed moving from lying on your back to sitting on the side of a flat bed without using  bedrails?: A Little Help needed moving to and from a bed to a chair (including a wheelchair)?: A Little Help needed standing up from a chair using your arms (e.g., wheelchair or bedside chair)?: A Little Help needed to walk in hospital room?: A Lot Help needed climbing 3-5 steps with a railing? : A Lot 6 Click Score: 16    End of Session Equipment Utilized During Treatment: Gait belt Activity Tolerance: Other (comment) (limited by dizziness, and loose stool) Patient left: in chair;with family/visitor present Nurse Communication: Mobility status PT Visit Diagnosis: Other abnormalities of gait and mobility (R26.89);Muscle weakness (generalized) (M62.81);Difficulty in walking, not elsewhere classified (R26.2);Pain Pain - part of body:  (abdomen)    Time: 7340-3709 PT Time Calculation (min) (ACUTE ONLY): 54 min   Charges:   PT Evaluation $PT Eval Moderate Complexity: 1 Mod PT Treatments $Therapeutic Activity: 23-37 mins        [employer], PT, [employer] 10/24/21,12:03 PM

## 2021-10-24 NOTE — Hospital Course (Addendum)
81 year old female with past medical history of colon cancer (adenocarcinoma with large bowel obstruction requiring emergent laparotomy and subtotal colectomy in August 2022), hypothyroidism and hyperlipidemia admitted on 7/20 by general surgery for colostomy reversal and abdominal wall reconstruction who developed new onset paroxysmal atrial fibrillation on 7/22.  Recent PET scan discovered metastases to liver and lung.   CARDIAC: Hospitalists were consulted for management of postoperative atrial fibrillation.  Patient started on IV amiodarone drip. Transitioned off and rate has been controlled. Cardiology consulted given hypotensive. Started low dose metoprolol. BP remained low, pt asymptomatic but not ambulating much. Have had to hold metoprolol at tiems d/t hypotension. Echo this admission EF 55-60, normal LV function, diastolic parameters indeterminate, normal RV fxn. 07/26: switching metoprolol to 12.5 mg bid and transition from lovenox to eliquis 5 mg bid at discretion of surgeon and hospitalist - cardiology has signed off. 07/27: BP low but stable, was hypotensive overnight which responded to IV fluids. 07/28-07/29: HR improving, BP stable. 07/29 PM: SOB and CXR concerning for worsening cardiomegaly (vs rotation?) and pt started on Lasix continued into 07/30.  RESPIRATORY / ID: Repeated CXR 07/24 to eval PNA given coughing: RLL findings appear progressed compared to previous CXR, initiated CAP treatment. Pt had been stable on room air, cough improving, using her incentive spirometer. New O2 requirement but she was declining CXR 07/29 "tired of Xrays," in evening O2 became dislodged and pt desaturated requiring BiPap, CXR at that time noted new cardiomegaly (vs rotation?), pt was transfer to SDU w/ blood transfusion and lasix x2 doses ordered, ABG improved on BiPap, pt has been on and off BiPap, encouraged compliance w this and pulmonary is following along  SURGICAL / GI: Concern for ileus 07/26 but no  evidence of serious complications. 07/27: no BM x4 days, (+)nausea mid-day progressed to vomiting. Placed NG tube for concern for obstruction, gen surg (Dr Dahlia Byes) following, NG helped bloating and symptoms. 07/28: TPN and PICC. CT showed ileus no obstruction or other intraabdominal concerns.  Progression goals: resolution/improvement of postoperative ileus, pending placement hoping to get inpatient rehab approved but anticipate will be here at least through mid next week pending medical stabilization.

## 2021-10-24 NOTE — Assessment & Plan Note (Signed)
Continue Synthroid °

## 2021-10-24 NOTE — Assessment & Plan Note (Signed)
Improving.  Continue gentle IV fluids.

## 2021-10-24 NOTE — Assessment & Plan Note (Signed)
Currently hypotensive in part due to atrial fibrillation and being dry.  Holding antihypertensives.

## 2021-10-24 NOTE — Progress Notes (Signed)
[facility] Progress Note  Patient: Jenna [name]    MHD:[id number]  DOA: 10/17/2021    Date of Service: the patient was seen and examined on 10/24/2021  Brief hospital course: 81 year old female with past medical history of colon cancer (adenocarcinoma with large bowel obstruction requiring emergent laparotomy and subtotal colectomy in August 2022), hypothyroidism and hyperlipidemia admitted on 7/20 by general surgery for colostomy reversal and abdominal wall reconstruction who developed new onset paroxysmal atrial fibrillation on 7/22.  Recent PET scan discovered metastases to liver and lung.  Hospitalists were consulted for management of atrial fibrillation.  Patient started on IV amiodarone drip.  Assessment and Plan: Assessment and Plan: * S/P colostomy takedown Management as per surgery.  Paroxysmal atrial fibrillation with RVR (HCC) Transition amiodarone to p.o.  Patient still with heart rate at times slightly elevated.  She is also still a little dry which led to her getting a little hypotensive when Cardizem was introduced.  Will restart Cardizem at very low dose and see how she tolerates.  If she is still having episodes of tachycardia and/or hypotension, will consult cardiology.  Echocardiogram done 7/23 with results pending.  Metastatic colon cancer to liver [facility]) Known history of adenocarcinoma of the colon causing large bowel obstruction requiring emergent laparotomy and subtotal colectomy with bowel perforation.  Recent PET scan found metastases to liver and lung.  Patient following with oncology as outpatient.  Acute kidney injury superimposed on stage IIIa CKD ([place]) Improving.  Continue gentle IV fluids.  Essential hypertension Currently hypotensive in part due to atrial fibrillation and being dry.  Holding antihypertensives.  Hyperlipidemia Continue statin  Acquired hypothyroidism Continue Synthroid  Overweight (BMI 25.0-29.9) Meets criteria for BMI greater  than 25       Body mass index is 27.12 kg/m.        Consultants: Hospitalists  Procedures: Echocardiogram with results pending  Antimicrobials: Preop Ancef  Code Status: Full code   Subjective: Patient tired, feels weak  Objective: Had some hypotension earlier Vitals:   10/24/21 1700 10/24/21 1740  BP: 103/61 103/61  Pulse: 98 99  Resp: (!) 22 (!) 21  Temp:    SpO2: 96% 96%    Intake/Output Summary (Last 24 hours) at 10/24/2021 1907 Last data filed at 10/24/2021 1018 Gross per 24 hour  Intake 20 ml  Output 520 ml  Net -500 ml   Filed Weights   10/20/2021 0807  Weight: 71.7 kg   Body mass index is 27.12 kg/m.  Exam:  General: Alert and oriented x2, no acute distress HEENT: Normocephalic and atraumatic, mucous membranes slightly dry Cardiovascular: Irregular rhythm, occasional episodes of tachycardia Respiratory: Clear to auscultation bilaterally Abdomen: Soft, tender, status post ileostomy takedown Musculoskeletal: No clubbing or cyanosis, 1+ pitting edema Skin: Wounds from surgery Psychiatry: Appropriate, no evidence of psychoses Neurology: No focal deficits  Data Reviewed: Improving renal function  Disposition:  Status is: Inpatient Remains inpatient appropriate because:  -As per general surgery -Improvement and stabilization of heart rate    Anticipated discharge date: 7/25   Family Communication: Left message for husband DVT Prophylaxis: SCDs Start: 10/13/2021 1636    Author: Annita Brod ,MD 10/24/2021 7:07 PM  To reach On-call, see care teams to locate the attending and reach out via [email or web address]. Between 7PM-7AM, please contact night-coverage If you still have difficulty reaching the attending provider, please page the [facility] (Director on Call) for [facility] on amion for assistance.

## 2021-10-24 NOTE — Assessment & Plan Note (Addendum)
CHADS2VASC 6. Off amio/dilt at this point d/t hypotension. Echocardiogram done 7/23, preserved EF, unable to assess diastolic function. Previous hospitalist discussed with general surgery.  Until she is further out from surgery, would not start full dose anticoagulation. Cardiology saw her 10/25/21 and started low dose metoprolol, will start Eliquis 5 mg bid in another 48 hours

## 2021-10-24 NOTE — Assessment & Plan Note (Signed)
Meets criteria for BMI greater than 25

## 2021-10-24 NOTE — Assessment & Plan Note (Signed)
Management as per surgery.

## 2021-10-25 ENCOUNTER — Other Ambulatory Visit: Payer: Self-pay

## 2021-10-25 ENCOUNTER — Inpatient Hospital Stay: Payer: Medicare HMO

## 2021-10-25 DIAGNOSIS — I10 Essential (primary) hypertension: Secondary | ICD-10-CM | POA: Diagnosis not present

## 2021-10-25 DIAGNOSIS — I480 Paroxysmal atrial fibrillation: Secondary | ICD-10-CM | POA: Diagnosis not present

## 2021-10-25 DIAGNOSIS — Z9889 Other specified postprocedural states: Secondary | ICD-10-CM | POA: Diagnosis not present

## 2021-10-25 LAB — COMPREHENSIVE METABOLIC PANEL
ALT: 9 U/L (ref 0–44)
AST: 12 U/L — ABNORMAL LOW (ref 15–41)
Albumin: 2.6 g/dL — ABNORMAL LOW (ref 3.5–5.0)
Alkaline Phosphatase: 49 U/L (ref 38–126)
Anion gap: 7 (ref 5–15)
BUN: 20 mg/dL (ref 8–23)
CO2: 20 mmol/L — ABNORMAL LOW (ref 22–32)
Calcium: 8.3 mg/dL — ABNORMAL LOW (ref 8.9–10.3)
Chloride: 106 mmol/L (ref 98–111)
Creatinine, Ser: 1.54 mg/dL — ABNORMAL HIGH (ref 0.44–1.00)
GFR, Estimated: 34 mL/min — ABNORMAL LOW (ref >60)
Glucose, Bld: 119 mg/dL — ABNORMAL HIGH (ref 70–99)
Potassium: 3.7 mmol/L (ref 3.5–5.1)
Sodium: 133 mmol/L — ABNORMAL LOW (ref 135–145)
Total Bilirubin: 0.6 mg/dL (ref 0.3–1.2)
Total Protein: 5.5 g/dL — ABNORMAL LOW (ref 6.5–8.1)

## 2021-10-25 LAB — ECHOCARDIOGRAM COMPLETE
AR max vel: 1.57 cm2
AV Peak grad: 6.3 mmHg
Ao pk vel: 1.25 m/s
Area-P 1/2: 5.75 cm2
Calc EF: 58.9 %
Height: 64 in
S' Lateral: 2.93 cm
Single Plane A2C EF: 68.6 %
Single Plane A4C EF: 49.4 %
Weight: 2527.99 oz

## 2021-10-25 LAB — CBC
HCT: 26.2 % — ABNORMAL LOW (ref 36.0–46.0)
Hemoglobin: 8.6 g/dL — ABNORMAL LOW (ref 12.0–15.0)
MCH: 31.3 pg (ref 26.0–34.0)
MCHC: 32.8 g/dL (ref 30.0–36.0)
MCV: 95.3 fL (ref 80.0–100.0)
Platelets: 146 10*3/uL — ABNORMAL LOW (ref 150–400)
RBC: 2.75 MIL/uL — ABNORMAL LOW (ref 3.87–5.11)
RDW: 15.7 % — ABNORMAL HIGH (ref 11.5–15.5)
WBC: 7.5 10*3/uL (ref 4.0–10.5)
nRBC: 0 % (ref 0.0–0.2)

## 2021-10-25 LAB — PROTIME-INR
INR: 1.3 — ABNORMAL HIGH (ref 0.8–1.2)
Prothrombin Time: 16.3 seconds — ABNORMAL HIGH (ref 11.4–15.2)

## 2021-10-25 LAB — SURGICAL PATHOLOGY

## 2021-10-25 MED ORDER — ZINC SULFATE 220 (50 ZN) MG PO CAPS
220.0000 mg | ORAL_CAPSULE | Freq: Every day | ORAL | Status: DC
  Administered 2021-10-25 – 2021-10-28 (×4): 220 mg via ORAL
  Filled 2021-10-25 (×4): qty 1

## 2021-10-25 MED ORDER — AMOXICILLIN-POT CLAVULANATE 500-125 MG PO TABS
1.0000 | ORAL_TABLET | Freq: Two times a day (BID) | ORAL | Status: DC
  Administered 2021-10-25 – 2021-10-26 (×3): 500 mg via ORAL
  Filled 2021-10-25 (×5): qty 1

## 2021-10-25 MED ORDER — IPRATROPIUM-ALBUTEROL 0.5-2.5 (3) MG/3ML IN SOLN
3.0000 mL | Freq: Four times a day (QID) | RESPIRATORY_TRACT | Status: DC | PRN
  Administered 2021-10-28: 3 mL via RESPIRATORY_TRACT
  Filled 2021-10-25: qty 3

## 2021-10-25 MED ORDER — METOPROLOL TARTRATE 25 MG PO TABS
12.5000 mg | ORAL_TABLET | Freq: Four times a day (QID) | ORAL | Status: DC
  Administered 2021-10-26 (×2): 12.5 mg via ORAL
  Filled 2021-10-25 (×6): qty 1

## 2021-10-25 MED ORDER — JUVEN PO PACK
1.0000 | PACK | Freq: Two times a day (BID) | ORAL | Status: DC
  Administered 2021-10-25 – 2021-10-28 (×6): 1 via ORAL

## 2021-10-25 MED ORDER — FUROSEMIDE 10 MG/ML IJ SOLN
20.0000 mg | Freq: Once | INTRAMUSCULAR | Status: AC
Start: 2021-10-25 — End: 2021-10-25
  Administered 2021-10-25: 20 mg via INTRAVENOUS
  Filled 2021-10-25: qty 2

## 2021-10-25 MED ORDER — AZITHROMYCIN 250 MG PO TABS
500.0000 mg | ORAL_TABLET | Freq: Once | ORAL | Status: AC
Start: 2021-10-25 — End: 2021-10-25
  Administered 2021-10-25: 500 mg via ORAL
  Filled 2021-10-25: qty 2

## 2021-10-25 MED ORDER — ASCORBIC ACID 500 MG PO TABS
500.0000 mg | ORAL_TABLET | Freq: Two times a day (BID) | ORAL | Status: DC
  Administered 2021-10-25 – 2021-10-28 (×7): 500 mg via ORAL
  Filled 2021-10-25 (×7): qty 1

## 2021-10-25 MED ORDER — IPRATROPIUM-ALBUTEROL 0.5-2.5 (3) MG/3ML IN SOLN
3.0000 mL | Freq: Four times a day (QID) | RESPIRATORY_TRACT | Status: DC
  Administered 2021-10-25: 3 mL via RESPIRATORY_TRACT
  Filled 2021-10-25: qty 3

## 2021-10-25 MED ORDER — AZITHROMYCIN 250 MG PO TABS
250.0000 mg | ORAL_TABLET | Freq: Every day | ORAL | Status: DC
  Administered 2021-10-26 – 2021-10-28 (×3): 250 mg via ORAL
  Filled 2021-10-25 (×3): qty 1

## 2021-10-25 MED ORDER — ENSURE MAX PROTEIN PO LIQD
11.0000 [oz_av] | Freq: Every day | ORAL | Status: DC
  Administered 2021-10-25 – 2021-10-27 (×3): 11 [oz_av] via ORAL
  Filled 2021-10-25: qty 330

## 2021-10-25 MED ORDER — ADULT MULTIVITAMIN W/MINERALS CH
1.0000 | ORAL_TABLET | Freq: Every day | ORAL | Status: DC
  Administered 2021-10-25 – 2021-10-28 (×4): 1 via ORAL
  Filled 2021-10-25 (×4): qty 1

## 2021-10-25 NOTE — Care Management Important Message (Signed)
Important Message  Patient Details  Name: KAROLYNA BIANCHINI MRN: [id number] Date of Birth: June 11, 1940   Medicare Important Message Given:  Yes     Dannette Barbara 10/25/2021, 11:43 AM

## 2021-10-25 NOTE — Progress Notes (Signed)
[place] Hospital Day(s): 4.   Post op day(s): 4 Days Post-Op.   Interval History:  Patient seen and examined No acute events or new complaints overnight.  Rate controlled; on PO amiodarone; Lovenox for anticoagulation; BP still soft; echo with EF 55-60% Patient reports she is doing well No fever, chills, nausea, emesis  She is without leukocytosis; 7.5K Hgb stable 8.6 Renal function improved; sCr - 1.54; UO - unmeasured x4 INR 1.3; PTT 16.3 Surgical Drains:             - LLQ: 175 ccs; serosanguinous             - RLQ (retrorectus): 35 ccs; serosanguinous She is on a soft diet; tolerating Having BM; loose Worked with therapies; HHPT; daughter still worried about mobility  Vital signs in last 24 hours: [min-max] current  Temp:  [98.1 F (36.7 C)-99.5 F (37.5 C)] 98.1 F (36.7 C) (07/24 0751) Pulse Rate:  [71-110] 71 (07/24 0751) Resp:  [18-28] 18 (07/24 0751) BP: (85-124)/(49-95) 92/59 (07/24 0751) SpO2:  [93 %-100 %] 95 % (07/24 0751)     Height: 5\' 4"  (162.6 cm) Weight: 71.7 kg BMI (Calculated): 27.11   Intake/Output last 2 shifts:  07/23 0701 - 07/24 0700 In: 4304.1 [I.V.:4304.1] Out: 210 [Drains:210]   Physical Exam:  Constitutional: alert, cooperative and no distress  Respiratory: breathing non-labored at rest  Cardiovascular: rate regular, seems to be in regular rhythm, appreciate few pVCs Gastrointestinal: Soft, non-tender, non-distended, no rebound/guarding. Drains in RLQ/LLQ; both serous Integumentary: Prevena in place; good seal; 4 days left  Labs:     Latest Ref Rng & Units 10/25/2021    4:10 AM 10/24/2021    8:29 AM 10/23/2021    6:35 AM  CBC  WBC 4.0 - 10.5 K/uL 7.5  8.0  4.6   Hemoglobin 12.0 - 15.0 g/dL 8.6  9.3  8.1   Hematocrit 36.0 - 46.0 % 26.2  27.6  24.7   Platelets 150 - 400 K/uL 146  148  118       Latest Ref Rng & Units 10/25/2021    4:10 AM 10/24/2021    8:29 AM 10/23/2021    9:14 AM  CMP   Glucose 70 - 99 mg/dL 119  117  102   BUN 8 - 23 mg/dL 20  20  26    Creatinine 0.44 - 1.00 mg/dL 1.54  1.61  1.97   Sodium 135 - 145 mmol/L 133  133  130   Potassium 3.5 - 5.1 mmol/L 3.7  3.9  4.5   Chloride 98 - 111 mmol/L 106  105  107   CO2 22 - 32 mmol/L 20  19  15    Calcium 8.9 - 10.3 mg/dL 8.3  8.5  8.1   Total Protein 6.5 - 8.1 g/dL 5.5  5.7    Total Bilirubin 0.3 - 1.2 mg/dL 0.6  0.4    Alkaline Phos 38 - 126 U/L 49  46    AST 15 - 41 U/L 12  16    ALT 0 - 44 U/L 9  13       Imaging studies: No new pertinent imaging studies   Assessment/Plan:  81 y.o. female 4 Days Post-Op s/p ileostomy takedown, abdominal wall reconstruction, and placement of Prevena vac   - Okay to continue soft diet from surgical perspective  - Monitor abdominal examination; on-going bowel function; imodium on board to help with diarrhea  - Pain  control prn; antiemetics prn  - Continue Prevena; 7 days (4 days left)  - Continue surgical drains; monitor and record output; may be able to DC before discharge  - Appreciate medicine admission with atrial fibrillation; controlled   - Continue therapeutic Lovenox   - Working with therapies; recommending HH PT; daughter still nervous about her functional status  - Discharge Planning: Progressing   All of the above findings and recommendations were discussed with the patient, patient's family (daughter at bedside), and the medical team, and all of their questions were answered to their expressed satisfaction.  -- Edison Simon, PA-C [facility] 10/25/2021, 10:36 AM M-F: 7am - 4pm

## 2021-10-25 NOTE — Consult Note (Signed)
Cardiology Consultation:   Patient ID: UDELL BLASINGAME MRN: [id number]; DOB: Jan 10, 1941  Admit date: 10/16/2021 Date of Consult: 10/25/2021  PCP:  [facility] Cardiologist:  Kate Sable, MD        Patient Profile:   Jenna [name] is a 81 y.o. female with a hx of colon cancer, previous stroke, essential hypertension and hyperlipidemia who is being seen 10/25/2021 for the evaluation of atrial fibrillation at the request of Dr. Sheppard Coil.  History of Present Illness:   Jenna [name] is a pleasant 81 year old female with past medical history of essential hypertension and hyperlipidemia.  She suffered an embolic stroke in 8029 and was seen in our office by Dr. Garen Lah for atrial fibrillation work-up.  She underwent 2-week outpatient monitor that showed no evidence of atrial fibrillation.  Echocardiogram was nonrevealing. In August 2022, she was diagnosed with obstructive colon cancer requiring emergent surgery.  Lymph nodes were negative and thus she initially did not require chemotherapy.  However, in January 2023 she had a PET scan done and was found to have liver and lung lesions highly suggestive of metastatic disease.  She was started on chemotherapy in March.  She presented for an elective surgery that included ileostomy takedown with incisional hernia repair which was done on July 28.  She developed atrial fibrillation with RVR on July 22.  Rate control was associated with hypotension and thus she was started on IV amiodarone.  Ventricular rate became more controlled and then she was switched to oral amiodarone 200 mg once daily.  She had acute kidney injury with a creatinine of 1.9 that improved with hydration.  When she was tachycardic, she felt palpitations and had increased shortness of breath.  As her ventricular rate became more controlled, she felt better.  She is still now in atrial fibrillation but ventricular rate is more  controlled.   Past Medical History:  Diagnosis Date   BRCA negative 03/2020   MyRisk neg except MSH3 VUS   Colon cancer [facility])    Family history of breast cancer    IBIS=4.4%/riskscore=2.9%   Gout    Hyperlipidemia    Hypertension    Scarlet fever    Stroke ([place]) 05/2019   Thyroid disease     Past Surgical History:  Procedure Laterality Date   ABDOMINAL WALL DEFECT REPAIR N/A 10/12/2021   Procedure: REPAIR ABDOMINAL WALL;  Surgeon: Jules Husbands, MD;  Location: [facility] ORS;  Service: General;  Laterality: N/A;   APPLICATION OF WOUND VAC  11/16/2020   Procedure: APPLICATION OF WOUND VAC;  Surgeon: Fredirick Maudlin, MD;  Location: [place] [facility];  Service: General;;  Serial # [id number]   BREAST BIOPSY Right 1978   neg   CHOLECYSTECTOMY     COLECTOMY WITH COLOSTOMY CREATION/HARTMANN PROCEDURE N/A 11/16/2020   Procedure: SUBTOTAL COLECTOMY WITH ILEOSTOMY CREATION;  Surgeon: Fredirick Maudlin, MD;  Location: [facility] OR[facility];  Service: General;  Laterality: N/A;   COLOSTOMY TAKEDOWN N/A 10/03/2021   Procedure: COLOSTOMY TAKEDOWN-RNFA to assist;  Surgeon: Jules Husbands, MD;  Location: [facility] ORS;  Service: General;  Laterality: N/A;  provider is requesting 4 hours total for these procedures   FLEXIBLE SIGMOIDOSCOPY N/A 09/09/2021   Procedure: FLEXIBLE SIGMOIDOSCOPY;  Surgeon: Lin Landsman, MD;  Location: [facility] ENDOSCOPY;  Service: Gastroenterology;  Laterality: N/A;   FLEXIBLE SIGMOIDOSCOPY N/A 09/10/2021   Procedure: FLEXIBLE SIGMOIDOSCOPY;  Surgeon: Lin Landsman, MD;  Location: [facility] ENDOSCOPY;  Service: Gastroenterology;  Laterality: N/A;  LAPAROTOMY N/A 11/16/2020   Procedure: EXPLORATORY LAPAROTOMY;  Surgeon: Fredirick Maudlin, MD;  Location: [facility] OR[facility];  Service: General;  Laterality: N/A;   PORTACATH PLACEMENT N/A 06/03/2021   Procedure: INSERTION PORT-A-CATH;  Surgeon: Jules Husbands, MD;  Location: [facility];  Service: General;  Laterality: N/A;  Provider requesting 1 hour / 60 minutes for  procedure.   VENTRAL HERNIA REPAIR N/A 10/27/2021   Procedure: HERNIA REPAIR VENTRAL ADULT;  Surgeon: Jules Husbands, MD;  Location: [facility] OR[facility];  Service: General;  Laterality: N/A;     Home Medications:  Prior to Admission medications   Medication Sig Start Date End Date Taking? Authorizing Provider  allopurinol (ZYLOPRIM) 100 MG tablet Take 100 mg by mouth every evening.   Yes [provider]  aspirin EC 81 MG tablet Take 81 mg by mouth every evening.   Yes [provider]  Calcium Carb-Cholecalciferol (CALCIUM 600+D) 600-20 MG-MCG TABS Take 1 tablet by mouth daily.   Yes [provider]  levothyroxine (SYNTHROID) 50 MCG tablet Take 50 mcg by mouth daily. 04/30/19  Yes [provider]  rosuvastatin (CRESTOR) 40 MG tablet Take 40 mg by mouth every evening.   Yes [provider]  lidocaine-prilocaine (EMLA) cream Apply small amount over port site 2 hours prior to it being accessed and cover with plastic each time port is to be accessed 06/03/21   Cammie Sickle, MD    Inpatient Medications: Scheduled Meds:  acetaminophen  1,000 mg Oral Q6H   allopurinol  100 mg Oral QPM   amoxicillin-clavulanate  1 tablet Oral Q12H   vitamin C  500 mg Oral BID   [START ON 10/26/2021] azithromycin  250 mg Oral Daily   azithromycin  500 mg Oral Once   Chlorhexidine Gluconate Cloth  6 each Topical Daily   enoxaparin (LOVENOX) injection  1 mg/kg Subcutaneous Q24H   levothyroxine  50 mcg Oral Daily   loperamide  4 mg Oral TID   metoprolol tartrate  12.5 mg Oral Q6H   multivitamin with minerals  1 tablet Oral Daily   nutrition supplement (JUVEN)  1 packet Oral BID BM   pantoprazole (PROTONIX) IV  40 mg Intravenous Q12H   pregabalin  50 mg Oral TID   Ensure Max Protein  11 oz Oral QHS   sodium chloride flush  10-40 mL Intracatheter Q12H   zinc sulfate  220 mg Oral Daily   Continuous Infusions:  sodium chloride Stopped (10/23/21 0003)   sodium chloride 50  mL/hr at 10/25/21 0000   methocarbamol (ROBAXIN) IV     PRN Meds: sodium chloride, diphenhydrAMINE **OR** diphenhydrAMINE, ipratropium-albuterol, melatonin, methocarbamol **OR** methocarbamol (ROBAXIN) IV, morphine injection, ondansetron **OR** ondansetron (ZOFRAN) IV, oxyCODONE, prochlorperazine **OR** prochlorperazine, sodium chloride flush  Allergies:   No Known Allergies  Social History:   Social History   Socioeconomic History   Marital status: Married    Spouse name: Not on file   Number of children: Not on file   Years of education: Not on file   Highest education level: Not on file  Occupational History   Not on file  Tobacco Use   Smoking status: Every Day    Packs/day: 0.25    Types: Cigarettes   Smokeless tobacco: Never   Tobacco comments:    smoking 4-5 cigarettes daily.   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 Lives with husband in [place].    Social Determinants of Health   Financial Resource Strain: Not on file  Food Insecurity: Not on file  Transportation Needs: Not on file  Physical Activity: Not on file  Stress: Not on file  Social Connections: Not on file  Intimate Partner Violence: Not on file    Family History:    Family History  Problem Relation Age of Onset   Breast cancer Maternal Grandmother 2   Breast cancer Cousin 40     ROS:  Please see the history of present illness.   All other ROS reviewed and negative.     Physical Exam/Data:   Vitals:   10/25/21 0751 10/25/21 1210 10/25/21 1346 10/25/21 1518  BP: (!) 92/59 98/65  106/62  Pulse: 71 66  95  Resp: 18 20  20   Temp: 98.1 F (36.7 C) 98.5 F (36.9 C)  97.9 F (36.6 C)  TempSrc:      SpO2: 95% 98% 95% 99%  Weight:      Height:         Intake/Output Summary (Last 24 hours) at 10/25/2021 1533 Last data filed at 10/25/2021 1425 Gross per 24 hour  Intake 4764.05 ml  Output 260 ml  Net 4504.05 ml      10/15/2021    8:07 AM 10/14/2021   10:21 AM 09/28/2021    8:56 AM  Last 3 Weights  Weight (lbs) 158 lb 158 lb 162 lb 9.6 oz  Weight (kg) 71.668 kg 71.668 kg 73.755 kg     Body mass index is 27.12 kg/m.  General:  Well nourished, well developed, in no acute distress HEENT: normal Neck: no JVD Vascular: No carotid bruits; Distal pulses 2+ bilaterally Cardiac:  normal S1, S2; irregularly irregular; no murmur  Lungs:  clear to auscultation bilaterally, no wheezing, rhonchi or rales  Abd: soft, nontender, no hepatomegaly  Ext: no edema Musculoskeletal:  No deformities, BUE and BLE strength normal and equal Skin: warm and dry  Neuro:  CNs 2-12 intact, no focal abnormalities noted Psych:  Normal affect   EKG:  The EKG was personally reviewed and demonstrates: EKG on July 18 showed sinus rhythm.  EKG in July 22 showed atrial fibrillation with RVR with low voltage. Telemetry:  Telemetry was personally reviewed and demonstrates: Atrial fibrillation  Relevant CV Studies: Echocardiogram was done today was personally reviewed by me.  It showed normal LV systolic function with no significant valvular abnormalities.  Laboratory Data:  High Sensitivity Troponin:  No results for input(s): "TROPONINIHS" in the last 720 hours.   Chemistry Recent Labs  Lab 10/23/21 0914 10/24/21 0829 10/25/21 0410  NA 130* 133* 133*  K 4.5 3.9 3.7  CL 107 105 106  CO2 15* 19* 20*  GLUCOSE 102* 117* 119*  BUN 26* 20 20  CREATININE 1.97* 1.61* 1.54*  CALCIUM 8.1* 8.5* 8.3*  MG  --  2.2  --   GFRNONAA 26* 33* 34*  ANIONGAP 8 9 7     Recent Labs  Lab 10/24/21 0829 10/25/21 0410  PROT 5.7* 5.5*  ALBUMIN 2.9* 2.6*  AST 16 12*  ALT 13 9  ALKPHOS 46 49  BILITOT 0.4 0.6   Lipids No results for input(s): "CHOL", "TRIG", "HDL",  "LABVLDL", "LDLCALC", "CHOLHDL" in the last 168 hours.  Hematology Recent Labs  Lab 10/23/21 [phone] 10/24/21  4854 10/25/21 0410  WBC 4.6 8.0 7.5  RBC 2.58* 2.89* 2.75*  HGB 8.1* 9.3* 8.6*  HCT 24.7* 27.6* 26.2*  MCV 95.7 95.5 95.3  MCH 31.4 32.2 31.3  MCHC 32.8 33.7 32.8  RDW 15.5 15.8* 15.7*  PLT 118* 148* 146*   Thyroid No results for input(s): "TSH", "FREET4" in the last 168 hours.  BNPNo results for input(s): "BNP", "PROBNP" in the last 168 hours.  DDimer No results for input(s): "DDIMER" in the last 168 hours.   Radiology/Studies:  DG Chest Port 1 View  Result Date: 10/25/2021 CLINICAL DATA:  Cough, shortness of breath EXAM: PORTABLE CHEST 1 VIEW COMPARISON:  10/23/2021 FINDINGS: Right-sided Port-A-Cath in satisfactory position. Calcified right upper lobe pulmonary nodule likely reflecting sequela prior granulomatous disease. Right lower lobe hazy airspace disease concerning for atelectasis versus pneumonia. No pleural effusion or pneumothorax. Heart and mediastinal contours are unremarkable. No acute osseous abnormality. IMPRESSION: 1. Right lower lobe hazy airspace disease concerning for atelectasis versus pneumonia. Electronically Signed   By: Kathreen Devoid M.D.   On: 10/25/2021 11:42   ECHOCARDIOGRAM COMPLETE  Result Date: 10/25/2021    ECHOCARDIOGRAM REPORT   Patient Name:   Jenna [name] Date of Exam: 10/24/2021 Medical Rec #:  [id number]        Height:       64.0 in Accession #:    [id number]       Weight:       158.0 lb Date of Birth:  1940-04-28        BSA:          1.770 m Patient Age:    33 years         BP:           108/69 mmHg Patient Gender: F                HR:           83 bpm. Exam Location:  [facility] Procedure: 2D Echo and Intracardiac Opacification Agent Indications:     Abnormal ECG R94.31                  Atrial Fibrillation I48.91  History:         Patient has no prior history of Echocardiogram examinations.  Sonographer:     Kathlen Brunswick RDCS Referring Phys:   [id number] DIEGO F PABON Diagnosing Phys: Kathlyn Sacramento MD  Sonographer Comments: Technically difficult study due to poor echo windows, no subcostal window and suboptimal apical window. Image acquisition challenging due to respiratory motion. IMPRESSIONS  1. Left ventricular ejection fraction, by estimation, is 55 to 60%. The left ventricle has normal function. Left ventricular endocardial border not optimally defined to evaluate regional wall motion. Left ventricular diastolic parameters are indeterminate.  2. Right ventricular systolic function is normal. The right ventricular size is normal. Tricuspid regurgitation signal is inadequate for assessing PA pressure.  3. The mitral valve is normal in structure. No evidence of mitral valve regurgitation. No evidence of mitral stenosis.  4. The aortic valve is normal in structure. Aortic valve regurgitation is not visualized. No aortic stenosis is present.  5. Technically difficult study due to poor echo windows, no subcostal window and suboptimal apical window. In addition, A-fib with RVR is noted. FINDINGS  Left Ventricle: Left ventricular ejection fraction, by estimation, is 55 to 60%. The left ventricle has normal function. Left ventricular endocardial border not optimally defined to evaluate regional wall motion. Definity contrast agent was  given IV to delineate the left ventricular endocardial borders. The left ventricular internal cavity size was normal in size. There is no left ventricular hypertrophy. Left ventricular diastolic parameters are indeterminate. Right Ventricle: The right ventricular size is normal. No increase in right ventricular wall thickness. Right ventricular systolic function is normal. Tricuspid regurgitation signal is inadequate for assessing PA pressure. Left Atrium: Left atrial size was normal in size. Right Atrium: Right atrial size was normal in size. Pericardium: There is no evidence of pericardial effusion. Mitral Valve: The mitral  valve is normal in structure. No evidence of mitral valve regurgitation. No evidence of mitral valve stenosis. Tricuspid Valve: The tricuspid valve is normal in structure. Tricuspid valve regurgitation is trivial. No evidence of tricuspid stenosis. Aortic Valve: The aortic valve is normal in structure. Aortic valve regurgitation is not visualized. No aortic stenosis is present. Aortic valve peak gradient measures 6.2 mmHg. Pulmonic Valve: The pulmonic valve was normal in structure. Pulmonic valve regurgitation is not visualized. No evidence of pulmonic stenosis. Aorta: The aortic root is normal in size and structure. Venous: The inferior vena cava was not well visualized. IAS/Shunts: No atrial level shunt detected by color flow Doppler.  LEFT VENTRICLE PLAX 2D LVIDd:         4.23 cm     Diastology LVIDs:         2.93 cm     LV e' medial:    11.20 cm/s LV PW:         1.08 cm     LV E/e' medial:  6.7 LV IVS:        0.86 cm     LV e' lateral:   12.60 cm/s LVOT diam:     1.80 cm     LV E/e' lateral: 5.9 LV SV:         29 LV SV Index:   16 LVOT Area:     2.54 cm  LV Volumes (MOD) LV vol d, MOD A2C: 67.8 ml LV vol d, MOD A4C: 72.3 ml LV vol s, MOD A2C: 21.3 ml LV vol s, MOD A4C: 36.6 ml LV SV MOD A2C:     46.5 ml LV SV MOD A4C:     72.3 ml LV SV MOD BP:      41.6 ml RIGHT VENTRICLE RV Basal diam:  2.05 cm RV S prime:     12.00 cm/s TAPSE (M-mode): 1.5 cm LEFT ATRIUM             Index LA diam:        2.40 cm 1.36 cm/m LA Vol (A2C):   32.1 ml 18.14 ml/m LA Vol (A4C):   25.9 ml 14.63 ml/m LA Biplane Vol: 29.8 ml 16.84 ml/m  AORTIC VALVE                 PULMONIC VALVE AV Area (Vmax): 1.57 cm     PV Vmax:       0.87 m/s AV Vmax:        125.00 cm/s  PV Peak grad:  3.0 mmHg AV Peak Grad:   6.2 mmHg LVOT Vmax:      77.00 cm/s LVOT Vmean:     53.000 cm/s LVOT VTI:       0.113 m  AORTA Ao Root diam: 2.90 cm MITRAL VALVE               TRICUSPID VALVE MV Area (PHT): 5.75 cm    TV Peak grad:   18.0  mmHg MV Decel Time: 132 msec     TV Vmax:        2.12 m/s MV E velocity: 74.60 cm/s MV A velocity: 51.00 cm/s  SHUNTS MV E/A ratio:  1.46        Systemic VTI:  0.11 m                            Systemic Diam: 1.80 cm Kathlyn Sacramento MD Electronically signed by Kathlyn Sacramento MD Signature Date/Time: 10/25/2021/9:50:17 AM    Final    DG CHEST PORT 1 VIEW  Result Date: 10/23/2021 CLINICAL DATA:  Fever EXAM: PORTABLE CHEST 1 VIEW COMPARISON:  Previous studies including the examination of 06/03/2021 FINDINGS: Transverse diameter of heart is increased. There are no signs of pulmonary edema. Increased markings are seen in right lower lung field. Rest of the lung fields are clear. There is minimal blunting of right lateral CP angle. There is no pneumothorax. Tip of right IJ chest port is seen in superior vena cava. IMPRESSION: Cardiomegaly. There are no signs of pulmonary edema. Radiograph increased markings are seen in medial right lower lung fields suggesting atelectasis/pneumonia. Electronically Signed   By: Elmer Picker M.D.   On: 10/23/2021 16:37     Assessment and Plan:   Paroxysmal atrial fibrillation with RVR: This was noted 2 days ago post surgery on July 20.  She is still in atrial fibrillation but ventricular rate is more controlled.  Her CHA2DS2-VASc score is 6 given her age, previous stroke, gender of and history of hypertension.  Thus, her cardioembolic risk is very high.  I discussed the indications anticoagulation with Dr. Dahlia Byes who recommends holding anticoagulation for at least another 48 hours given her postoperative status.  I think this is very reasonable.  Once cleared to start anticoagulation, recommend starting Eliquis 5 mg twice daily.  Given that she has been in atrial fibrillation for more than 48 hours, I discontinued amiodarone to minimize the risk of conversion to sinus rhythm.  Recommend continuing rate control.  I discontinued diltiazem and added small dose metoprolol. Essential hypertension: Her blood  pressure is on the low side but we will try to introduce a small dose metoprolol. Stage IV colon cancer currently chemotherapy Acute on chronic kidney disease, renal function improved with hydration.  Risk Assessment/Risk Scores:      CHA2DS2-VASc Score = 6   This indicates a 9.7% annual risk of stroke. The patient's score is based upon: CHF History: 0 HTN History: 1 Diabetes History: 0 Stroke History: 2 Vascular Disease History: 0 Age Score: 2 Gender Score: 1         For questions or updates, please contact [place] Please consult [email or web address] for contact info under    Signed, Kathlyn Sacramento, MD  10/25/2021 3:33 PM

## 2021-10-25 NOTE — Progress Notes (Signed)
PROGRESS NOTE    Jenna [name]  VFI:[id number] DOB: 10/20/1940  DOA: 10/13/2021 Date of Service: 10/25/21 PCP: [facility]     Brief Narrative / Hospital Course:   81 year old female with past medical history of colon cancer (adenocarcinoma with large bowel obstruction requiring emergent laparotomy and subtotal colectomy in August 2022), hypothyroidism and hyperlipidemia admitted on 7/20 by general surgery for colostomy reversal and abdominal wall reconstruction who developed new onset paroxysmal atrial fibrillation on 7/22.  Recent PET scan discovered metastases to liver and lung.  Hospitalists were consulted for management of atrial fibrillation.  Patient started on IV amiodarone drip, echo obtained, was able to transition to po amiodarone and cardizem but these held 07/24 d/t hypotension. On exam ausculation c/w NSR and this confirmed on telemetry. Cardiology will see patient, appreciate recs on need for continued anticoagulation vs Zio follow outpatient. Repeated CXR to eval PNA given coughing: RLL findings appear progressed compared to previous CXR, will initiate CAP treatment.     Subjective: Patient reports some coughing, daughter states cough is worse over past few days, no fever/chills, no SOB, pt has no palpitations or chest pain.      ASSESSMENT & PLAN:   Principal Problem:   S/P colostomy takedown Active Problems:   Paroxysmal atrial fibrillation with RVR (HCC)   Acute kidney injury superimposed on stage IIIa CKD ([place])   Metastatic colon cancer to liver [facility])   Essential hypertension   Hyperlipidemia   Acquired hypothyroidism   Overweight (BMI 25.0-29.9)  S/P colostomy takedown Management as per surgery.  Paroxysmal atrial fibrillation with RVR ([place]) CHADS2VASC 4. Off amio/dilt at this poitn d/t hypotension. Echocardiogram done 7/23, preserved EF, unable to assess diastolic function. Previous hospitalist discussed with general surgery.  Until  she is further out from surgery, would not start full dose anticoagulation. Cardiology to see the patient.   Cough Abnormal CXR concerning for RLL pneumonia Community Acquired Pneumonia Smoker - interested in quitting Starting Abx 10/25/21 a/ augmentin + azithro  Duonebs Pt declined nicotine replacement    Acquired hypothyroidism Continue Synthroid  Acute kidney injury superimposed on stage IIIa CKD ([place]) Improving.  Continue gentle IV fluids.  Essential hypertension Currently hypotensive in part due to atrial fibrillation and being dry.  Holding antihypertensives.  Hyperlipidemia Continue statin  Metastatic colon cancer to liver (HCC) Known history of adenocarcinoma of the colon causing large bowel obstruction requiring emergent laparotomy and subtotal colectomy with bowel perforation.  Recent PET scan found metastases to liver and lung.  Patient following with oncology as outpatient.  Overweight (BMI 25.0-29.9) Meets criteria for BMI greater than 25  DVT prophylaxis: SCD Code Status: FULL Family Communication: daughter at bedside               Objective: Vitals:   10/25/21 0508 10/25/21 0751 10/25/21 1210 10/25/21 1346  BP: 101/65 (!) 92/59 98/65   Pulse: 91 71 66   Resp: 20 18 20    Temp: 98.4 F (36.9 C) 98.1 F (36.7 C) 98.5 F (36.9 C)   TempSrc:      SpO2: 96% 95% 98% 95%  Weight:      Height:        Intake/Output Summary (Last 24 hours) at 10/25/2021 1354 Last data filed at 10/25/2021 1156 Gross per 24 hour  Intake 4524.05 ml  Output 260 ml  Net 4264.05 ml   Filed Weights   10/14/2021 0807  Weight: 71.7 kg    Examination:  Constitutional:  VS as above  General Appearance: alert, well-developed, well-nourished, NAD Eyes: Normal lids and conjunctive, non-icteric sclera PERRLA Ears, Nose, Mouth, Throat: Normal appearance Neck: No masses, trachea midline Respiratory: Normal respiratory effort Breath sounds clear at apices, poor  inspiratroy effort but coarse at bases  Cardiovascular: S1/S2 normal, RRR No lower extremity edema Gastrointestinal: Nontender, no masses No hernia appreciated Musculoskeletal:  No clubbing/cyanosis of digits Neurological: No cranial nerve deficit on limited exam Psychiatric: Normal judgment/insight Normal mood and affect       Scheduled Medications:   acetaminophen  1,000 mg Oral Q6H   allopurinol  100 mg Oral QPM   amiodarone  200 mg Oral Daily   vitamin C  500 mg Oral BID   Chlorhexidine Gluconate Cloth  6 each Topical Daily   diltiazem  30 mg Oral Q8H   enoxaparin (LOVENOX) injection  1 mg/kg Subcutaneous Q24H   ipratropium-albuterol  3 mL Nebulization Q6H   levothyroxine  50 mcg Oral Daily   loperamide  4 mg Oral TID   multivitamin with minerals  1 tablet Oral Daily   nutrition supplement (JUVEN)  1 packet Oral BID BM   pantoprazole (PROTONIX) IV  40 mg Intravenous Q12H   pregabalin  50 mg Oral TID   Ensure Max Protein  11 oz Oral QHS   sodium chloride flush  10-40 mL Intracatheter Q12H   zinc sulfate  220 mg Oral Daily    Continuous Infusions:  sodium chloride Stopped (10/23/21 0003)   sodium chloride 50 mL/hr at 10/25/21 0000   methocarbamol (ROBAXIN) IV      PRN Medications:  sodium chloride, diphenhydrAMINE **OR** diphenhydrAMINE, melatonin, methocarbamol **OR** methocarbamol (ROBAXIN) IV, morphine injection, ondansetron **OR** ondansetron (ZOFRAN) IV, oxyCODONE, prochlorperazine **OR** prochlorperazine, sodium chloride flush  Antimicrobials:  Anti-infectives (From admission, onward)    Start     Dose/Rate Route Frequency Ordered Stop   10/02/2021 2200  cefoTEtan (CEFOTAN) 2 g in sodium chloride 0.9 % 100 mL IVPB        2 g 200 mL/hr over 30 Minutes Intravenous Every 8 hours 10/27/2021 1739 10/22/21 0639   10/25/2021 1455  sodium chloride 0.9 % with cefoTEtan (CEFOTAN) ADS Med       Note to Pharmacy: Register, Santiago Glad A: cabinet override      10/25/2021 1455  10/11/2021 1549   10/18/2021 0816  sodium chloride 0.9 % with cefoTEtan (CEFOTAN) ADS Med       Note to Pharmacy: Herby Abraham W: cabinet override      10/11/2021 0816 10/10/2021 1139   10/26/2021 0730  cefoTEtan (CEFOTAN) 2 g in sodium chloride 0.9 % 100 mL IVPB        2 g 200 mL/hr over 30 Minutes Intravenous On call to O.R. 10/09/2021 0728 10/23/2021 1527       Data Reviewed: I have personally reviewed following labs and imaging studies  CBC: Recent Labs  Lab 10/13/2021 1656 10/22/21 0512 10/23/21 0635 10/24/21 0829 10/25/21 0410  WBC 2.9* 4.1 4.6 8.0 7.5  HGB 11.8* 9.1* 8.1* 9.3* 8.6*  HCT 37.3 28.1* 24.7* 27.6* 26.2*  MCV 99.5 97.2 95.7 95.5 95.3  PLT 189 149* 118* 148* 517*   Basic Metabolic Panel: Recent Labs  Lab 10/22/21 0512 10/22/21 1532 10/23/21 0914 10/24/21 0829 10/25/21 0410  NA 135  --  130* 133* 133*  K 5.5* 5.0 4.5 3.9 3.7  CL 114*  --  107 105 106  CO2 17*  --  15* 19* 20*  GLUCOSE 119*  --  102*  117* 119*  BUN 30*  --  26* 20 20  CREATININE 1.89*  --  1.97* 1.61* 1.54*  CALCIUM 7.9*  --  8.1* 8.5* 8.3*  MG  --   --   --  2.2  --   PHOS  --   --   --  3.1  --    GFR: Estimated Creatinine Clearance: 29.2 mL/min (A) (by C-G formula based on SCr of 1.54 mg/dL (H)). Liver Function Tests: Recent Labs  Lab 10/24/21 0829 10/25/21 0410  AST 16 12*  ALT 13 9  ALKPHOS 46 49  BILITOT 0.4 0.6  PROT 5.7* 5.5*  ALBUMIN 2.9* 2.6*   No results for input(s): "LIPASE", "AMYLASE" in the last 168 hours. No results for input(s): "AMMONIA" in the last 168 hours. Coagulation Profile: Recent Labs  Lab 10/24/21 0829 10/25/21 0410  INR 1.5* 1.3*   Cardiac Enzymes: No results for input(s): "CKTOTAL", "CKMB", "CKMBINDEX", "TROPONINI" in the last 168 hours. BNP (last 3 results) No results for input(s): "PROBNP" in the last 8760 hours. HbA1C: No results for input(s): "HGBA1C" in the last 72 hours. CBG: No results for input(s): "GLUCAP" in the last 168 hours. Lipid  Profile: No results for input(s): "CHOL", "HDL", "LDLCALC", "TRIG", "CHOLHDL", "LDLDIRECT" in the last 72 hours. Thyroid Function Tests: No results for input(s): "TSH", "T4TOTAL", "FREET4", "T3FREE", "THYROIDAB" in the last 72 hours. Anemia Panel: No results for input(s): "VITAMINB12", "FOLATE", "FERRITIN", "TIBC", "IRON", "RETICCTPCT" in the last 72 hours. Urine analysis:    Component Value Date/Time   COLORURINE YELLOW (A) 11/14/2020 2000   APPEARANCEUR HAZY (A) 11/14/2020 2000   LABSPEC 1.027 11/14/2020 2000   PHURINE 6.0 11/14/2020 2000   GLUCOSEU NEGATIVE 11/14/2020 2000   HGBUR NEGATIVE 11/14/2020 2000   BILIRUBINUR NEGATIVE 11/14/2020 2000   KETONESUR NEGATIVE 11/14/2020 2000   PROTEINUR NEGATIVE 11/14/2020 2000   NITRITE NEGATIVE 11/14/2020 2000   LEUKOCYTESUR MODERATE (A) 11/14/2020 2000   Sepsis Labs: @LABRCNTIP (procalcitonin:4,lacticidven:4)  No results found for this or any previous visit (from the past 240 hour(s)).       Radiology Studies last 96 hours: DG Chest Port 1 View  Result Date: 10/25/2021 CLINICAL DATA:  Cough, shortness of breath EXAM: PORTABLE CHEST 1 VIEW COMPARISON:  10/23/2021 FINDINGS: Right-sided Port-A-Cath in satisfactory position. Calcified right upper lobe pulmonary nodule likely reflecting sequela prior granulomatous disease. Right lower lobe hazy airspace disease concerning for atelectasis versus pneumonia. No pleural effusion or pneumothorax. Heart and mediastinal contours are unremarkable. No acute osseous abnormality. IMPRESSION: 1. Right lower lobe hazy airspace disease concerning for atelectasis versus pneumonia. Electronically Signed   By: Kathreen Devoid M.D.   On: 10/25/2021 11:42   ECHOCARDIOGRAM COMPLETE  Result Date: 10/25/2021    ECHOCARDIOGRAM REPORT   Patient Name:   Jenna [name] Date of Exam: 10/24/2021 Medical Rec #:  [id number]        Height:       64.0 in Accession #:    [id number]       Weight:       158.0 lb Date of Birth:   Feb 18, 1941        BSA:          1.770 m Patient Age:    17 years         BP:           108/69 mmHg Patient Gender: F                HR:  83 bpm. Exam Location:  [facility] Procedure: 2D Echo and Intracardiac Opacification Agent Indications:     Abnormal ECG R94.31                  Atrial Fibrillation I48.91  History:         Patient has no prior history of Echocardiogram examinations.  Sonographer:     Kathlen Brunswick RDCS Referring Phys:  [id number] DIEGO F PABON Diagnosing Phys: Kathlyn Sacramento MD  Sonographer Comments: Technically difficult study due to poor echo windows, no subcostal window and suboptimal apical window. Image acquisition challenging due to respiratory motion. IMPRESSIONS  1. Left ventricular ejection fraction, by estimation, is 55 to 60%. The left ventricle has normal function. Left ventricular endocardial border not optimally defined to evaluate regional wall motion. Left ventricular diastolic parameters are indeterminate.  2. Right ventricular systolic function is normal. The right ventricular size is normal. Tricuspid regurgitation signal is inadequate for assessing PA pressure.  3. The mitral valve is normal in structure. No evidence of mitral valve regurgitation. No evidence of mitral stenosis.  4. The aortic valve is normal in structure. Aortic valve regurgitation is not visualized. No aortic stenosis is present.  5. Technically difficult study due to poor echo windows, no subcostal window and suboptimal apical window. In addition, A-fib with RVR is noted. FINDINGS  Left Ventricle: Left ventricular ejection fraction, by estimation, is 55 to 60%. The left ventricle has normal function. Left ventricular endocardial border not optimally defined to evaluate regional wall motion. Definity contrast agent was given IV to delineate the left ventricular endocardial borders. The left ventricular internal cavity size was normal in size. There is no left ventricular hypertrophy. Left ventricular  diastolic parameters are indeterminate. Right Ventricle: The right ventricular size is normal. No increase in right ventricular wall thickness. Right ventricular systolic function is normal. Tricuspid regurgitation signal is inadequate for assessing PA pressure. Left Atrium: Left atrial size was normal in size. Right Atrium: Right atrial size was normal in size. Pericardium: There is no evidence of pericardial effusion. Mitral Valve: The mitral valve is normal in structure. No evidence of mitral valve regurgitation. No evidence of mitral valve stenosis. Tricuspid Valve: The tricuspid valve is normal in structure. Tricuspid valve regurgitation is trivial. No evidence of tricuspid stenosis. Aortic Valve: The aortic valve is normal in structure. Aortic valve regurgitation is not visualized. No aortic stenosis is present. Aortic valve peak gradient measures 6.2 mmHg. Pulmonic Valve: The pulmonic valve was normal in structure. Pulmonic valve regurgitation is not visualized. No evidence of pulmonic stenosis. Aorta: The aortic root is normal in size and structure. Venous: The inferior vena cava was not well visualized. IAS/Shunts: No atrial level shunt detected by color flow Doppler.  LEFT VENTRICLE PLAX 2D LVIDd:         4.23 cm     Diastology LVIDs:         2.93 cm     LV e' medial:    11.20 cm/s LV PW:         1.08 cm     LV E/e' medial:  6.7 LV IVS:        0.86 cm     LV e' lateral:   12.60 cm/s LVOT diam:     1.80 cm     LV E/e' lateral: 5.9 LV SV:         29 LV SV Index:   16 LVOT Area:     2.54 cm  LV Volumes (  MOD) LV vol d, MOD A2C: 67.8 ml LV vol d, MOD A4C: 72.3 ml LV vol s, MOD A2C: 21.3 ml LV vol s, MOD A4C: 36.6 ml LV SV MOD A2C:     46.5 ml LV SV MOD A4C:     72.3 ml LV SV MOD BP:      41.6 ml RIGHT VENTRICLE RV Basal diam:  2.05 cm RV S prime:     12.00 cm/s TAPSE (M-mode): 1.5 cm LEFT ATRIUM             Index LA diam:        2.40 cm 1.36 cm/m LA Vol (A2C):   32.1 ml 18.14 ml/m LA Vol (A4C):   25.9 ml  14.63 ml/m LA Biplane Vol: 29.8 ml 16.84 ml/m  AORTIC VALVE                 PULMONIC VALVE AV Area (Vmax): 1.57 cm     PV Vmax:       0.87 m/s AV Vmax:        125.00 cm/s  PV Peak grad:  3.0 mmHg AV Peak Grad:   6.2 mmHg LVOT Vmax:      77.00 cm/s LVOT Vmean:     53.000 cm/s LVOT VTI:       0.113 m  AORTA Ao Root diam: 2.90 cm MITRAL VALVE               TRICUSPID VALVE MV Area (PHT): 5.75 cm    TV Peak grad:   18.0 mmHg MV Decel Time: 132 msec    TV Vmax:        2.12 m/s MV E velocity: 74.60 cm/s MV A velocity: 51.00 cm/s  SHUNTS MV E/A ratio:  1.46        Systemic VTI:  0.11 m                            Systemic Diam: 1.80 cm Kathlyn Sacramento MD Electronically signed by Kathlyn Sacramento MD Signature Date/Time: 10/25/2021/9:50:17 AM    Final    DG CHEST PORT 1 VIEW  Result Date: 10/23/2021 CLINICAL DATA:  Fever EXAM: PORTABLE CHEST 1 VIEW COMPARISON:  Previous studies including the examination of 06/03/2021 FINDINGS: Transverse diameter of heart is increased. There are no signs of pulmonary edema. Increased markings are seen in right lower lung field. Rest of the lung fields are clear. There is minimal blunting of right lateral CP angle. There is no pneumothorax. Tip of right IJ chest port is seen in superior vena cava. IMPRESSION: Cardiomegaly. There are no signs of pulmonary edema. Radiograph increased markings are seen in medial right lower lung fields suggesting atelectasis/pneumonia. Electronically Signed   By: Elmer Picker M.D.   On: 10/23/2021 16:37            LOS: 4 days      Emeterio Reeve, DO [facility] 10/25/2021, 1:54 PM   Staff may message me via secure chat in [place]  but this may not receive immediate response,  please page for urgent matters!  If 7PM-7AM, please contact night-coverage [email or web address]  Dictation software was used to generate the above note. Typos may occur and escape review, as with typed/written notes. Please contact Dr Sheppard Coil directly  for clarity if needed.

## 2021-10-25 NOTE — Progress Notes (Signed)
Initial Nutrition Assessment  DOCUMENTATION CODES:   Not applicable  INTERVENTION:   -1 packet Juven BID, each packet provides 95 calories, 2.5 grams of protein (collagen), and 9.8 grams of carbohydrate (3 grams sugar); also contains 7 grams of L-arginine and L-glutamine, 300 mg vitamin C, 15 mg vitamin E, 1.2 mcg vitamin B-12, 9.5 mg zinc, 200 mg calcium, and 1.5 g  Calcium Beta-hydroxy-Beta-methylbutyrate to support wound healing  -Ensure Max po daily, each supplement provides 150 kcal and 30 grams of protein -MVI with minerals daily -500 mg vitamin C BID -220 mg zinc sulfate daily x 14 days  NUTRITION DIAGNOSIS:   Increased nutrient needs related to post-op healing as evidenced by estimated needs.  GOAL:   Patient will meet greater than or equal to 90% of their needs  MONITOR:   PO intake, Supplement acceptance  REASON FOR ASSESSMENT:   Malnutrition Screening Tool    ASSESSMENT:   Pt with past medical history of colon cancer (adenocarcinoma with large bowel obstruction requiring emergent laparotomy and subtotal colectomy in August 2022), hypothyroidism and hyperlipidemia admitted on 7/20 by general surgery for colostomy reversal and abdominal wall reconstruction who developed new onset paroxysmal atrial fibrillation  Pt admitted for colostomy takedown and a-fib.   7/20- s/p Ileostomy take down with stapled Ileoproctostomy EEA Baker Anastomosis; Abdominal wall reconstruction with bilateral myocutaneous flaps using a TAR release and  two Psahix Meshes  in  a home plate and diamond configuration polypropylene mesh measuring 30 x 25 cm and 20x 25cms; Incisional hernia repair greater 12 cms; Wound vac Placement 34 x 2 cm ( Prevena plus)  Reviewed I/O's: +4.1 L x 24 hours and +3.7 L since admission  Pt unavailable at time of visit. Attempted to speak with pt via call to hospital room phone, however, unable to reach. RD unable to obtain further nutrition-related history or  complete nutrition-focused physical exam at this time.    Pt currently on a soft diet. Noted meal completions 100%.    Reviewed wt hx; pt has experienced a 1.4% wt loss over the past 2 months, which is not significant for time frame.   Pt awaiting PT evaluation.   Medications reviewed and include cardizem and 0.9% sodium chloride infusion @ 50 ml/hr.   Labs reviewed: Na: 133, CBGS: 100 (inpatient orders for glycemic control are none).    Diet Order:   Diet Order             DIET SOFT Room service appropriate? Yes; Fluid consistency: Thin  Diet effective now                   EDUCATION NEEDS:   No education needs have been identified at this time  Skin:  Skin Assessment: Skin Integrity Issues: Skin Integrity Issues:: Wound VAC Wound Vac: abdomen  Last BM:  10/25/21 (type 7)  Height:   Ht Readings from Last 1 Encounters:  10/30/2021 5\' 4"  (1.626 m)    Weight:   Wt Readings from Last 1 Encounters:  10/27/2021 71.7 kg    Ideal Body Weight:  54.5 kg  BMI:  Body mass index is 27.12 kg/m.  Estimated Nutritional Needs:   Kcal:  1700-1900  Protein:  95-110 grams  Fluid:  > 1.7 L    Loistine Chance, RD, LDN, [place] Registered Dietitian II Certified Diabetes Care and Education Specialist Please refer to [facility] for RD and/or RD on-call/weekend/after hours pager

## 2021-10-25 NOTE — Progress Notes (Signed)
Spoke to the patient She has DME at home and does not need additional She has had [facility] in the past and would like to use them again,  I notified Alvis Lemmings Tommi Rumps) Her daughter will be staying with her and provides transportation

## 2021-10-25 NOTE — Plan of Care (Signed)
  Problem: Health Behavior/Discharge Planning: Goal: Ability to manage health-related needs will improve Outcome: Progressing   Problem: Clinical Measurements: Goal: Respiratory complications will improve Outcome: Progressing   Problem: Clinical Measurements: Goal: Cardiovascular complication will be avoided Outcome: Progressing   Problem: Elimination: Goal: Will not experience complications related to bowel motility Outcome: Progressing   Problem: Elimination: Goal: Will not experience complications related to urinary retention Outcome: Progressing   Problem: Pain Managment: Goal: General experience of comfort will improve Outcome: Progressing   Problem: Safety: Goal: Ability to remain free from injury will improve Outcome: Progressing

## 2021-10-25 NOTE — Progress Notes (Signed)
Patient transferred to room 237. All belongings transferred with patient. Report previously given and documented in hand off.

## 2021-10-26 DIAGNOSIS — Z9889 Other specified postprocedural states: Secondary | ICD-10-CM | POA: Diagnosis not present

## 2021-10-26 DIAGNOSIS — I4891 Unspecified atrial fibrillation: Secondary | ICD-10-CM | POA: Diagnosis not present

## 2021-10-26 LAB — CBC
HCT: 25.4 % — ABNORMAL LOW (ref 36.0–46.0)
Hemoglobin: 8.4 g/dL — ABNORMAL LOW (ref 12.0–15.0)
MCH: 31.5 pg (ref 26.0–34.0)
MCHC: 33.1 g/dL (ref 30.0–36.0)
MCV: 95.1 fL (ref 80.0–100.0)
Platelets: 153 10*3/uL (ref 150–400)
RBC: 2.67 MIL/uL — ABNORMAL LOW (ref 3.87–5.11)
RDW: 15.9 % — ABNORMAL HIGH (ref 11.5–15.5)
WBC: 3.7 10*3/uL — ABNORMAL LOW (ref 4.0–10.5)
nRBC: 0 % (ref 0.0–0.2)

## 2021-10-26 LAB — BASIC METABOLIC PANEL
Anion gap: 10 (ref 5–15)
BUN: 31 mg/dL — ABNORMAL HIGH (ref 8–23)
CO2: 17 mmol/L — ABNORMAL LOW (ref 22–32)
Calcium: 8.3 mg/dL — ABNORMAL LOW (ref 8.9–10.3)
Chloride: 108 mmol/L (ref 98–111)
Creatinine, Ser: 1.27 mg/dL — ABNORMAL HIGH (ref 0.44–1.00)
GFR, Estimated: 43 mL/min — ABNORMAL LOW (ref >60)
Glucose, Bld: 104 mg/dL — ABNORMAL HIGH (ref 70–99)
Potassium: 3.6 mmol/L (ref 3.5–5.1)
Sodium: 135 mmol/L (ref 135–145)

## 2021-10-26 MED ORDER — ENOXAPARIN SODIUM 80 MG/0.8ML IJ SOSY
1.0000 mg/kg | PREFILLED_SYRINGE | Freq: Two times a day (BID) | INTRAMUSCULAR | Status: DC
  Administered 2021-10-26 – 2021-10-29 (×7): 72.5 mg via SUBCUTANEOUS
  Filled 2021-10-26 (×7): qty 0.8

## 2021-10-26 NOTE — Progress Notes (Signed)
PROGRESS NOTE    Jenna [name]  KDT:[id number] DOB: 01-27-1941  DOA: 10/06/2021 Date of Service: 10/26/21 PCP: [facility]     Brief Narrative / Hospital Course:   81 year old female with past medical history of colon cancer (adenocarcinoma with large bowel obstruction requiring emergent laparotomy and subtotal colectomy in August 2022), hypothyroidism and hyperlipidemia admitted on 7/20 by general surgery for colostomy reversal and abdominal wall reconstruction who developed new onset paroxysmal atrial fibrillation on 7/22.  Recent PET scan discovered metastases to liver and lung.  Hospitalists were consulted for management of atrial fibrillation.  Patient started on IV amiodarone drip, echo obtained, was able to transition to po amiodarone and cardizem but these held 07/24 d/t hypotension. On exam ausculation c/w NSR and this confirmed on telemetry but then back into Afib. Cardiology will see patient, appreciate recs on need for continued anticoagulation vs Zio follow outpatient. Repeated CXR to eval PNA given coughing: RLL findings appear progressed compared to previous CXR, will initiate CAP treatment. 07/25: remains rate controlled, d/w cardiology and gen surg , team agrees to give another 1-2 days to allow stabilization on new medications, surgical recovery and surgical drain monitoring, holding ELiquis until tomorrow earliest    Subjective: Patient reports some coughing but better from yesterday, no fever/chills.  Patient's greatest concern is feeling overall weak, not sure she would be able to take care of herself at home.       ASSESSMENT & PLAN:   Principal Problem:   S/P colostomy takedown Active Problems:   Paroxysmal atrial fibrillation with RVR (HCC)   Acute kidney injury superimposed on stage IIIa CKD ([place])   Metastatic colon cancer to liver [facility])   Essential hypertension   Hyperlipidemia   Acquired hypothyroidism   Overweight (BMI 25.0-29.9)  S/P  colostomy takedown Management as per surgery.  Paroxysmal atrial fibrillation with RVR ([place]) CHADS2VASC 6. Off amio/dilt d/t hypotension. Echocardiogram done 7/23, preserved EF, unable to assess diastolic function. Previous hospitalist discussed with general surgery.  Until she is further out from surgery, would not start full dose anticoagulation. Cardiology started low-dose metoprolol and patient has tolerated this well but BP remains soft.  Cough Abnormal CXR concerning for RLL pneumonia Community Acquired Pneumonia Smoker - interested in quitting Starting Abx 10/25/21 a/ augmentin + azithro  Duonebs Pt declined nicotine replacement    Acquired hypothyroidism Continue Synthroid  Acute kidney injury superimposed on stage IIIa CKD ([place]) Improving.  Continue gentle IV fluids.  Lab Results  Component Value Date   CREATININE 1.27 (H) 10/26/2021   CREATININE 1.54 (H) 10/25/2021   CREATININE 1.61 (H) 10/24/2021   Essential hypertension Currently hypotensive in part due to atrial fibrillation and being dry.   Home antihypertensives held Cardiology following, started low-dose metoprolol BP remains a bit soft, continuing to monitor through today/tomorrow  Hyperlipidemia Continue statin  Metastatic colon cancer to liver ([place]) Known history of adenocarcinoma of the colon causing large bowel obstruction requiring emergent laparotomy and subtotal colectomy with bowel perforation.  Recent PET scan found metastases to liver and lung.  Patient following with oncology as outpatient.  Overweight (BMI 25.0-29.9) Meets criteria for BMI greater than 25  DVT prophylaxis: SCD and Lovenox Code Status: FULL Family Communication: daughter at bedside  Dispo: PT has recommended HH PT.              Objective: Vitals:   10/26/21 0000 10/26/21 0352 10/26/21 0534 10/26/21 1122  BP: (!) 91/55 (!) 103/56 (!) 91/58 115/72  Pulse:  96 97  (!) 106  Resp: 18 19 19 16   Temp: 99 F (37.2 C)  98.5 F (36.9 C)  98.6 F (37 C)  TempSrc: Oral   Oral  SpO2: 97%   97%  Weight:      Height:        Intake/Output Summary (Last 24 hours) at 10/26/2021 1323 Last data filed at 10/26/2021 1025 Gross per 24 hour  Intake 1487 ml  Output 758 ml  Net 729 ml    Filed Weights   10/18/2021 0807  Weight: 71.7 kg    Examination:  Constitutional:  VS as above General Appearance: alert, well-developed, well-nourished, NAD Eyes: Normal lids and conjunctive, non-icteric sclera PERRLA Ears, Nose, Mouth, Throat: Normal appearance Neck: No masses, trachea midline Respiratory: Normal respiratory effort Breath sounds clear improved compared to yesterday's exam Cardiovascular: S1/S2 normal, RRR No lower extremity edema Gastrointestinal: Nontender Musculoskeletal:  No clubbing/cyanosis of digits Neurological: No cranial nerve deficit on limited exam Psychiatric: Normal judgment/insight Normal mood and affect       Scheduled Medications:   acetaminophen  1,000 mg Oral Q6H   allopurinol  100 mg Oral QPM   amoxicillin-clavulanate  1 tablet Oral Q12H   vitamin C  500 mg Oral BID   azithromycin  250 mg Oral Daily   Chlorhexidine Gluconate Cloth  6 each Topical Daily   enoxaparin (LOVENOX) injection  1 mg/kg Subcutaneous Q12H   levothyroxine  50 mcg Oral Daily   loperamide  4 mg Oral TID   metoprolol tartrate  12.5 mg Oral Q6H   multivitamin with minerals  1 tablet Oral Daily   nutrition supplement (JUVEN)  1 packet Oral BID BM   pantoprazole (PROTONIX) IV  40 mg Intravenous Q12H   pregabalin  50 mg Oral TID   Ensure Max Protein  11 oz Oral QHS   sodium chloride flush  10-40 mL Intracatheter Q12H   zinc sulfate  220 mg Oral Daily    Continuous Infusions:  sodium chloride Stopped (10/23/21 0003)   sodium chloride 50 mL/hr at 10/26/21 1125   methocarbamol (ROBAXIN) IV      PRN Medications:  sodium chloride, diphenhydrAMINE **OR** diphenhydrAMINE,  ipratropium-albuterol, melatonin, methocarbamol **OR** methocarbamol (ROBAXIN) IV, morphine injection, ondansetron **OR** ondansetron (ZOFRAN) IV, oxyCODONE, prochlorperazine **OR** prochlorperazine, sodium chloride flush  Antimicrobials:  Anti-infectives (From admission, onward)    Start     Dose/Rate Route Frequency Ordered Stop   10/26/21 1000  azithromycin (ZITHROMAX) tablet 250 mg        250 mg Oral Daily 10/25/21 1357 10/30/21 0959   10/25/21 1445  amoxicillin-clavulanate (AUGMENTIN) 500-125 MG per tablet 500 mg        1 tablet Oral Every 12 hours 10/25/21 1357 10/30/21 0959   10/25/21 1445  azithromycin (ZITHROMAX) tablet 500 mg        500 mg Oral  Once 10/25/21 1357 10/25/21 1544   10/27/2021 2200  cefoTEtan (CEFOTAN) 2 g in sodium chloride 0.9 % 100 mL IVPB        2 g 200 mL/hr over 30 Minutes Intravenous Every 8 hours 10/10/2021 1739 10/22/21 0639   10/20/2021 1455  sodium chloride 0.9 % with cefoTEtan (CEFOTAN) ADS Med       Note to Pharmacy: Register, Santiago Glad A: cabinet override      10/31/2021 1455 10/11/2021 1549   10/26/2021 0816  sodium chloride 0.9 % with cefoTEtan (CEFOTAN) ADS Med       Note to Pharmacy: Trude Mcburney:  cabinet override      10/28/2021 0816 10/12/2021 1139   10/16/2021 0730  cefoTEtan (CEFOTAN) 2 g in sodium chloride 0.9 % 100 mL IVPB        2 g 200 mL/hr over 30 Minutes Intravenous On call to O.R. 10/04/2021 0728 10/05/2021 1527       Data Reviewed: I have personally reviewed following labs and imaging studies  CBC: Recent Labs  Lab 10/22/21 0512 10/23/21 0635 10/24/21 0829 10/25/21 0410 10/26/21 0550  WBC 4.1 4.6 8.0 7.5 3.7*  HGB 9.1* 8.1* 9.3* 8.6* 8.4*  HCT 28.1* 24.7* 27.6* 26.2* 25.4*  MCV 97.2 95.7 95.5 95.3 95.1  PLT 149* 118* 148* 146* 970    Basic Metabolic Panel: Recent Labs  Lab 10/22/21 0512 10/22/21 1532 10/23/21 0914 10/24/21 0829 10/25/21 0410 10/26/21 0550  NA 135  --  130* 133* 133* 135  K 5.5* 5.0 4.5 3.9 3.7 3.6  CL 114*  --   107 105 106 108  CO2 17*  --  15* 19* 20* 17*  GLUCOSE 119*  --  102* 117* 119* 104*  BUN 30*  --  26* 20 20 31*  CREATININE 1.89*  --  1.97* 1.61* 1.54* 1.27*  CALCIUM 7.9*  --  8.1* 8.5* 8.3* 8.3*  MG  --   --   --  2.2  --   --   PHOS  --   --   --  3.1  --   --     GFR: Estimated Creatinine Clearance: 35.4 mL/min (A) (by C-G formula based on SCr of 1.27 mg/dL (H)). Liver Function Tests: Recent Labs  Lab 10/24/21 0829 10/25/21 0410  AST 16 12*  ALT 13 9  ALKPHOS 46 49  BILITOT 0.4 0.6  PROT 5.7* 5.5*  ALBUMIN 2.9* 2.6*    No results for input(s): "LIPASE", "AMYLASE" in the last 168 hours. No results for input(s): "AMMONIA" in the last 168 hours. Coagulation Profile: Recent Labs  Lab 10/24/21 0829 10/25/21 0410  INR 1.5* 1.3*    Cardiac Enzymes: No results for input(s): "CKTOTAL", "CKMB", "CKMBINDEX", "TROPONINI" in the last 168 hours. BNP (last 3 results) No results for input(s): "PROBNP" in the last 8760 hours. HbA1C: No results for input(s): "HGBA1C" in the last 72 hours. CBG: No results for input(s): "GLUCAP" in the last 168 hours. Lipid Profile: No results for input(s): "CHOL", "HDL", "LDLCALC", "TRIG", "CHOLHDL", "LDLDIRECT" in the last 72 hours. Thyroid Function Tests: No results for input(s): "TSH", "T4TOTAL", "FREET4", "T3FREE", "THYROIDAB" in the last 72 hours. Anemia Panel: No results for input(s): "VITAMINB12", "FOLATE", "FERRITIN", "TIBC", "IRON", "RETICCTPCT" in the last 72 hours. Urine analysis:    Component Value Date/Time   COLORURINE YELLOW (A) 11/14/2020 2000   APPEARANCEUR HAZY (A) 11/14/2020 2000   LABSPEC 1.027 11/14/2020 2000   PHURINE 6.0 11/14/2020 2000   GLUCOSEU NEGATIVE 11/14/2020 2000   HGBUR NEGATIVE 11/14/2020 2000   BILIRUBINUR NEGATIVE 11/14/2020 2000   KETONESUR NEGATIVE 11/14/2020 2000   PROTEINUR NEGATIVE 11/14/2020 2000   NITRITE NEGATIVE 11/14/2020 2000   LEUKOCYTESUR MODERATE (A) 11/14/2020 2000   Sepsis  Labs: @LABRCNTIP (procalcitonin:4,lacticidven:4)  No results found for this or any previous visit (from the past 240 hour(s)).       Radiology Studies last 96 hours: DG Chest Port 1 View  Result Date: 10/25/2021 CLINICAL DATA:  Cough, shortness of breath EXAM: PORTABLE CHEST 1 VIEW COMPARISON:  10/23/2021 FINDINGS: Right-sided Port-A-Cath in satisfactory position. Calcified right upper lobe pulmonary nodule likely  reflecting sequela prior granulomatous disease. Right lower lobe hazy airspace disease concerning for atelectasis versus pneumonia. No pleural effusion or pneumothorax. Heart and mediastinal contours are unremarkable. No acute osseous abnormality. IMPRESSION: 1. Right lower lobe hazy airspace disease concerning for atelectasis versus pneumonia. Electronically Signed   By: Kathreen Devoid M.D.   On: 10/25/2021 11:42   ECHOCARDIOGRAM COMPLETE  Result Date: 10/25/2021    ECHOCARDIOGRAM REPORT   Patient Name:   Jenna [name] Date of Exam: 10/24/2021 Medical Rec #:  [id number]        Height:       64.0 in Accession #:    [id number]       Weight:       158.0 lb Date of Birth:  01-Aug-1940        BSA:          1.770 m Patient Age:    34 years         BP:           108/69 mmHg Patient Gender: F                HR:           83 bpm. Exam Location:  [facility] Procedure: 2D Echo and Intracardiac Opacification Agent Indications:     Abnormal ECG R94.31                  Atrial Fibrillation I48.91  History:         Patient has no prior history of Echocardiogram examinations.  Sonographer:     Kathlen Brunswick RDCS Referring Phys:  [id number] DIEGO F PABON Diagnosing Phys: Kathlyn Sacramento MD  Sonographer Comments: Technically difficult study due to poor echo windows, no subcostal window and suboptimal apical window. Image acquisition challenging due to respiratory motion. IMPRESSIONS  1. Left ventricular ejection fraction, by estimation, is 55 to 60%. The left ventricle has normal function. Left ventricular endocardial  border not optimally defined to evaluate regional wall motion. Left ventricular diastolic parameters are indeterminate.  2. Right ventricular systolic function is normal. The right ventricular size is normal. Tricuspid regurgitation signal is inadequate for assessing PA pressure.  3. The mitral valve is normal in structure. No evidence of mitral valve regurgitation. No evidence of mitral stenosis.  4. The aortic valve is normal in structure. Aortic valve regurgitation is not visualized. No aortic stenosis is present.  5. Technically difficult study due to poor echo windows, no subcostal window and suboptimal apical window. In addition, A-fib with RVR is noted. FINDINGS  Left Ventricle: Left ventricular ejection fraction, by estimation, is 55 to 60%. The left ventricle has normal function. Left ventricular endocardial border not optimally defined to evaluate regional wall motion. Definity contrast agent was given IV to delineate the left ventricular endocardial borders. The left ventricular internal cavity size was normal in size. There is no left ventricular hypertrophy. Left ventricular diastolic parameters are indeterminate. Right Ventricle: The right ventricular size is normal. No increase in right ventricular wall thickness. Right ventricular systolic function is normal. Tricuspid regurgitation signal is inadequate for assessing PA pressure. Left Atrium: Left atrial size was normal in size. Right Atrium: Right atrial size was normal in size. Pericardium: There is no evidence of pericardial effusion. Mitral Valve: The mitral valve is normal in structure. No evidence of mitral valve regurgitation. No evidence of mitral valve stenosis. Tricuspid Valve: The tricuspid valve is normal in structure. Tricuspid valve regurgitation is trivial. No evidence of tricuspid stenosis.  Aortic Valve: The aortic valve is normal in structure. Aortic valve regurgitation is not visualized. No aortic stenosis is present. Aortic valve  peak gradient measures 6.2 mmHg. Pulmonic Valve: The pulmonic valve was normal in structure. Pulmonic valve regurgitation is not visualized. No evidence of pulmonic stenosis. Aorta: The aortic root is normal in size and structure. Venous: The inferior vena cava was not well visualized. IAS/Shunts: No atrial level shunt detected by color flow Doppler.  LEFT VENTRICLE PLAX 2D LVIDd:         4.23 cm     Diastology LVIDs:         2.93 cm     LV e' medial:    11.20 cm/s LV PW:         1.08 cm     LV E/e' medial:  6.7 LV IVS:        0.86 cm     LV e' lateral:   12.60 cm/s LVOT diam:     1.80 cm     LV E/e' lateral: 5.9 LV SV:         29 LV SV Index:   16 LVOT Area:     2.54 cm  LV Volumes (MOD) LV vol d, MOD A2C: 67.8 ml LV vol d, MOD A4C: 72.3 ml LV vol s, MOD A2C: 21.3 ml LV vol s, MOD A4C: 36.6 ml LV SV MOD A2C:     46.5 ml LV SV MOD A4C:     72.3 ml LV SV MOD BP:      41.6 ml RIGHT VENTRICLE RV Basal diam:  2.05 cm RV S prime:     12.00 cm/s TAPSE (M-mode): 1.5 cm LEFT ATRIUM             Index LA diam:        2.40 cm 1.36 cm/m LA Vol (A2C):   32.1 ml 18.14 ml/m LA Vol (A4C):   25.9 ml 14.63 ml/m LA Biplane Vol: 29.8 ml 16.84 ml/m  AORTIC VALVE                 PULMONIC VALVE AV Area (Vmax): 1.57 cm     PV Vmax:       0.87 m/s AV Vmax:        125.00 cm/s  PV Peak grad:  3.0 mmHg AV Peak Grad:   6.2 mmHg LVOT Vmax:      77.00 cm/s LVOT Vmean:     53.000 cm/s LVOT VTI:       0.113 m  AORTA Ao Root diam: 2.90 cm MITRAL VALVE               TRICUSPID VALVE MV Area (PHT): 5.75 cm    TV Peak grad:   18.0 mmHg MV Decel Time: 132 msec    TV Vmax:        2.12 m/s MV E velocity: 74.60 cm/s MV A velocity: 51.00 cm/s  SHUNTS MV E/A ratio:  1.46        Systemic VTI:  0.11 m                            Systemic Diam: 1.80 cm Kathlyn Sacramento MD Electronically signed by Kathlyn Sacramento MD Signature Date/Time: 10/25/2021/9:50:17 AM    Final    DG CHEST PORT 1 VIEW  Result Date: 10/23/2021 CLINICAL DATA:  Fever EXAM: PORTABLE CHEST  1 VIEW COMPARISON:  Previous studies including the examination of  06/03/2021 FINDINGS: Transverse diameter of heart is increased. There are no signs of pulmonary edema. Increased markings are seen in right lower lung field. Rest of the lung fields are clear. There is minimal blunting of right lateral CP angle. There is no pneumothorax. Tip of right IJ chest port is seen in superior vena cava. IMPRESSION: Cardiomegaly. There are no signs of pulmonary edema. Radiograph increased markings are seen in medial right lower lung fields suggesting atelectasis/pneumonia. Electronically Signed   By: Elmer Picker M.D.   On: 10/23/2021 16:37            LOS: 5 days      Emeterio Reeve, DO [facility] 10/26/2021, 1:23 PM   Staff may message me via secure chat in [place]  but this may not receive immediate response,  please page for urgent matters!  If 7PM-7AM, please contact night-coverage [email or web address]  Dictation software was used to generate the above note. Typos may occur and escape review, as with typed/written notes. Please contact Dr Sheppard Coil directly for clarity if needed.

## 2021-10-26 NOTE — Consult Note (Signed)
ANTICOAGULATION CONSULT NOTE - Initial Consult  Pharmacy Consult for Enoxaparin Indication: atrial fibrillation  No Known Allergies  Patient Measurements: Height: 5' 4"  (162.6 cm) Weight: 71.7 kg (158 lb) IBW/kg (Calculated) : 54.7  Vital Signs: Temp: 98.5 F (36.9 C) (07/25 0352) Temp Source: Oral (07/25 0000) BP: 91/58 (07/25 0534) Pulse Rate: 97 (07/25 0352)  Labs: Recent Labs    10/24/21 0829 10/25/21 0410 10/26/21 0550  HGB 9.3* 8.6* 8.4*  HCT 27.6* 26.2* 25.4*  PLT 148* 146* 153  LABPROT 18.2* 16.3*  --   INR 1.5* 1.3*  --   CREATININE 1.61* 1.54* 1.27*     Estimated Creatinine Clearance: 35.4 mL/min (A) (by C-G formula based on SCr of 1.27 mg/dL (H)).   Medical History: Past Medical History:  Diagnosis Date   BRCA negative 03/2020   MyRisk neg except MSH3 VUS   Colon cancer [facility])    Family history of breast cancer    IBIS=4.4%/riskscore=2.9%   Gout    Hyperlipidemia    Hypertension    [place] fever    Stroke [facility]) 05/2019   Thyroid disease     Assessment: Patient status post ileostomy. Medical history includes Afib,and  AKI. Pharmacy consulted to dose enoxaparin for A.fib. Chads-Vasc score of 6 with hx os stroke.   Hgb 11.8-->9.3>8.6>8.4 PLT 189-->118-->146>153  Plan:  Adjusted Enoxaparin 23m/kg every 24 hours > Enoxaparin 148mkg q12h based on renal recovery. [Scr 1.97-->1.54>1.27 (baseline<1)] Will plan to check anti-Xa level if plan duration is more than 5 days on new regimen. Plan per cardiology is for DOAC after LMWH.  BrLorna DibblePharmD, BCVirginia Beach Ambulatory Surgery Centerlinical Pharmacist 10/26/2021 9:22 AM

## 2021-10-26 NOTE — Progress Notes (Signed)
[place] Hospital Day(s): 5.   Post op day(s): 5 Days Post-Op.   Interval History:  Patient seen and examined No acute events or new complaints overnight.  Rate controlled; cardiology now on-board; on Metoprolol only for rate control; echo with EF 55-60% Patient reports she is doing well, feels like "it will be a good day" No abdominal pain, fever, chills, nausea, emesis  She is actually mildly leukopenic this morning to 3.7 Hgb stable at 8.4 Renal function improved; sCr - 1.27; UO - 700 ccs Surgical Drains:             - LLQ: 83 ccs; serosanguinous             - RLQ (retrorectus): 25 ccs; serosanguinous She is on a soft diet; tolerating Having BM; loose Worked with therapies; HHPT; daughter still worried about mobility  Vital signs in last 24 hours: [min-max] current  Temp:  [97.9 F (36.6 C)-99.7 F (37.6 C)] 98.5 F (36.9 C) (07/25 0352) Pulse Rate:  [66-97] 97 (07/25 0352) Resp:  [18-20] 19 (07/25 0534) BP: (91-106)/(55-65) 91/58 (07/25 0534) SpO2:  [95 %-99 %] 97 % (07/25 0000)     Height: 5\' 4"  (162.6 cm) Weight: 71.7 kg BMI (Calculated): 27.11   Intake/Output last 2 shifts:  07/24 0701 - 07/25 0700 In: 3976 [P.O.:1197; I.V.:50] Out: 808 [Urine:700; Drains:108]   Physical Exam:  Constitutional: alert, cooperative and no distress  Respiratory: breathing non-labored at rest  Cardiovascular: rate regular, irregular Gastrointestinal: Soft, non-tender, non-distended, no rebound/guarding. Drains in RLQ/LLQ; both serous Integumentary: Prevena in place; good seal; 3 days left  Labs:     Latest Ref Rng & Units 10/26/2021    5:50 AM 10/25/2021    4:10 AM 10/24/2021    8:29 AM  CBC  WBC 4.0 - 10.5 K/uL 3.7  7.5  8.0   Hemoglobin 12.0 - 15.0 g/dL 8.4  8.6  9.3   Hematocrit 36.0 - 46.0 % 25.4  26.2  27.6   Platelets 150 - 400 K/uL 153  146  148       Latest Ref Rng & Units 10/26/2021    5:50 AM 10/25/2021    4:10 AM 10/24/2021     8:29 AM  CMP  Glucose 70 - 99 mg/dL 104  119  117   BUN 8 - 23 mg/dL 31  20  20    Creatinine 0.44 - 1.00 mg/dL 1.27  1.54  1.61   Sodium 135 - 145 mmol/L 135  133  133   Potassium 3.5 - 5.1 mmol/L 3.6  3.7  3.9   Chloride 98 - 111 mmol/L 108  106  105   CO2 22 - 32 mmol/L 17  20  19    Calcium 8.9 - 10.3 mg/dL 8.3  8.3  8.5   Total Protein 6.5 - 8.1 g/dL  5.5  5.7   Total Bilirubin 0.3 - 1.2 mg/dL  0.6  0.4   Alkaline Phos 38 - 126 U/L  49  46   AST 15 - 41 U/L  12  16   ALT 0 - 44 U/L  9  13      Imaging studies: No new pertinent imaging studies   Assessment/Plan:  81 y.o. female 5 Days Post-Op s/p ileostomy takedown, abdominal wall reconstruction, and placement of Prevena vac   - Okay to continue soft diet from surgical perspective  - Monitor abdominal examination; on-going bowel function; imodium on board to help  with diarrhea  - Pain control prn; antiemetics prn  - Continue Prevena; 7 days (3 days left); we can remove this before DC or if starts to interfere with mobility  - Continue surgical drains; monitor and record output; may be able to DC before discharge  - Appreciate cardiology assistance; hold formal anticoagulation for another 24 hours. If doing well, can start Eliquis tomorrow (07/26)  - Continue therapeutic Lovenox for now   - Working with therapies; recommending [place] PT; daughter still nervous about her functional status  - Further management per primary service; we will follow   - Discharge Planning: Nothing to add from surgical perspective, suspect she will benefit from another 24-48 hours given cardiac concerns and to further work with therapies.   All of the above findings and recommendations were discussed with the patient, patient's family (daughter at bedside), and the medical team, and all of their questions were answered to their expressed satisfaction.  -- Edison Simon, PA-C [facility] 10/26/2021, 7:31 AM M-F: 7am - 4pm

## 2021-10-26 NOTE — Progress Notes (Signed)
Physical Therapy Treatment Patient Details Name: Jenna [name] MRN: [id number] DOB: 11-21-1940 Today's Date: 10/26/2021   History of Present Illness Jenna [name] is a 81 year old female with known history of adenocarcinoma of the descending colon causing large bowel obstruction requiring emergent laparotomy and subtotal colectomy with end ileostomy by Dr. Celine Ahr on August 2022, she did Perforate.  She had 29 nodes that were negative for metastatic disease.  She has been followed by oncology and recent PET/CT is consistent with metastatic disease to the liver.  There is also lung lesion c/w metastasis. Patient is S/P ileostomy take down, hernia repair and abdominal wall reconstruction 10/23/2021.    PT Comments    Pt was pleasant and motivated to participate during the session and put forth good effort throughout. At onset of session nursing reported that the patient's NP requested pt to have an abdominal binder on with mobility with binder ordered but not received.  Nurse requested supine therex only at this time.  Pt put forth good effort with below therex with no adverse symptoms noted.  Pt will benefit from HHPT upon discharge to safely address deficits listed in patient problem list for decreased caregiver assistance and eventual return to PLOF.     Recommendations for follow up therapy are one component of a multi-disciplinary discharge planning process, led by the attending physician.  Recommendations may be updated based on patient status, additional functional criteria and insurance authorization.  Follow Up Recommendations  Home health PT     Assistance Recommended at Discharge Frequent or constant Supervision/Assistance  Patient can return home with the following A little help with walking and/or transfers;A little help with bathing/dressing/bathroom;Help with stairs or ramp for entrance;Assist for transportation;Assistance with cooking/housework   Equipment Recommendations  None  recommended by PT    Recommendations for Other Services       Precautions / Restrictions Precautions Precautions: Fall Restrictions Weight Bearing Restrictions: No Other Position/Activity Restrictions: multiple JP drains; per nursing on 7/25 pt to have abd binder on with mobility     Mobility  Bed Mobility               General bed mobility comments: NT; per patient's nurse the NP stated that pt is to have an abd binder on with mobility, nursing requested supine therex only until binder is obtained    Transfers                        Ambulation/Gait                   Stairs             Wheelchair Mobility    Modified Rankin (Stroke Patients Only)       Balance                                            Cognition Arousal/Alertness: Awake/alert Behavior During Therapy: WFL for tasks assessed/performed Overall Cognitive Status: Within Functional Limits for tasks assessed                                          Exercises Total Joint Exercises Ankle Circles/Pumps: AROM, Strengthening, Both, 5 reps, 10 reps (with manual resistance) Quad Sets: Strengthening, Both, 5 reps,  10 reps Gluteal Sets: Strengthening, Both, 10 reps Short Arc Quad: Strengthening, Both, 10 reps (with manual resistance) Heel Slides: Strengthening, Both, 5 reps Bridges: Strengthening, Both, 10 reps (gentle low amplitude) Other Exercises Other Exercises: HEP education with pt and family for BLE APs, QS, GS, SAQ, and bridges    General Comments        Pertinent Vitals/Pain Pain Assessment Pain Assessment: No/denies pain Pain Location: no pain at rest, min abdominal pain with movement Pain Intervention(s): Repositioned, Monitored during session    Home Living                          Prior Function            PT Goals (current goals can now be found in the care plan section) Progress towards PT goals: PT to  reassess next treatment    Frequency    Min 2X/week      PT Plan Current plan remains appropriate    Co-evaluation              AM-PAC PT "6 Clicks" Mobility   Outcome Measure  Help needed turning from your back to your side while in a flat bed without using bedrails?: A Little Help needed moving from lying on your back to sitting on the side of a flat bed without using bedrails?: A Little Help needed moving to and from a bed to a chair (including a wheelchair)?: A Little Help needed standing up from a chair using your arms (e.g., wheelchair or bedside chair)?: A Little Help needed to walk in hospital room?: A Lot Help needed climbing 3-5 steps with a railing? : A Lot 6 Click Score: 16    End of Session   Activity Tolerance: Patient tolerated treatment well Patient left: in bed;with family/visitor present;with call bell/phone within reach;with bed alarm set;with SCD's reapplied Nurse Communication: Mobility status PT Visit Diagnosis: Other abnormalities of gait and mobility (R26.89);Muscle weakness (generalized) (M62.81);Difficulty in walking, not elsewhere classified (R26.2);Pain     Time: 1041-1107 PT Time Calculation (min) (ACUTE ONLY): 26 min  Charges:  $Therapeutic Exercise: 23-37 mins                    D. Scott Aalyah Mansouri PT, DPT 10/26/21, 11:56 AM

## 2021-10-26 NOTE — Progress Notes (Addendum)
Cardiology Progress Note   Patient Name: Jenna [name] Date of Encounter: 10/26/2021  Primary Cardiologist: Kate Sable, MD  Subjective   Mild abdominal discomfort and dyspnea with activity in her room but overall feels that she is improving.  She denies palpitations.  She remains in A-fib/flutter in the 80-90 range at rest.  Inpatient Medications    Scheduled Meds:  acetaminophen  1,000 mg Oral Q6H   allopurinol  100 mg Oral QPM   amoxicillin-clavulanate  1 tablet Oral Q12H   vitamin C  500 mg Oral BID   azithromycin  250 mg Oral Daily   Chlorhexidine Gluconate Cloth  6 each Topical Daily   enoxaparin (LOVENOX) injection  1 mg/kg Subcutaneous Q12H   levothyroxine  50 mcg Oral Daily   loperamide  4 mg Oral TID   metoprolol tartrate  12.5 mg Oral Q6H   multivitamin with minerals  1 tablet Oral Daily   nutrition supplement (JUVEN)  1 packet Oral BID BM   pantoprazole (PROTONIX) IV  40 mg Intravenous Q12H   pregabalin  50 mg Oral TID   Ensure Max Protein  11 oz Oral QHS   sodium chloride flush  10-40 mL Intracatheter Q12H   zinc sulfate  220 mg Oral Daily   Continuous Infusions:  sodium chloride Stopped (10/23/21 0003)   sodium chloride 50 mL/hr at 10/25/21 1547   methocarbamol (ROBAXIN) IV     PRN Meds: sodium chloride, diphenhydrAMINE **OR** diphenhydrAMINE, ipratropium-albuterol, melatonin, methocarbamol **OR** methocarbamol (ROBAXIN) IV, morphine injection, ondansetron **OR** ondansetron (ZOFRAN) IV, oxyCODONE, prochlorperazine **OR** prochlorperazine, sodium chloride flush   Vital Signs    Vitals:   10/25/21 2100 10/26/21 0000 10/26/21 0352 10/26/21 0534  BP: 99/65 (!) 91/55 (!) 103/56 (!) 91/58  Pulse:  96 97   Resp: 20 18 19 19   Temp: 99.7 F (37.6 C) 99 F (37.2 C) 98.5 F (36.9 C)   TempSrc: Oral Oral    SpO2: 97% 97%    Weight:      Height:        Intake/Output Summary (Last 24 hours) at 10/26/2021 1021 Last data filed at 10/26/2021  0545 Gross per 24 hour  Intake 1247 ml  Output 778 ml  Net 469 ml   Filed Weights   10/30/2021 0807  Weight: 71.7 kg    Physical Exam   GEN: Well nourished, well developed, in no acute distress.  HEENT: Grossly normal.  Neck: Supple, no JVD, carotid bruits, or masses. Cardiac: Irregularly irregular, no murmurs, rubs, or gallops. No clubbing, cyanosis, edema.  Radials 2+, DP/PT 2+ and equal bilaterally.  Respiratory:  Respirations regular and unlabored, clear to auscultation bilaterally. GI: Soft, mild lower abdominal tenderness.  Nondistended, BS + x 4.  Dressing to lower abdomen without drainage. MS: no deformity or atrophy. Skin: warm and dry, no rash. Neuro:  Strength and sensation are intact. Psych: AAOx3.  Normal affect.  Labs    Chemistry Recent Labs  Lab 10/24/21 0829 10/25/21 0410 10/26/21 0550  NA 133* 133* 135  K 3.9 3.7 3.6  CL 105 106 108  CO2 19* 20* 17*  GLUCOSE 117* 119* 104*  BUN 20 20 31*  CREATININE 1.61* 1.54* 1.27*  CALCIUM 8.5* 8.3* 8.3*  PROT 5.7* 5.5*  --   ALBUMIN 2.9* 2.6*  --   AST 16 12*  --   ALT 13 9  --   ALKPHOS 46 49  --   BILITOT 0.4 0.6  --   [facility]  33* 34* 43*  ANIONGAP 9 7 10      Hematology Recent Labs  Lab 10/24/21 0829 10/25/21 0410 10/26/21 0550  WBC 8.0 7.5 3.7*  RBC 2.89* 2.75* 2.67*  HGB 9.3* 8.6* 8.4*  HCT 27.6* 26.2* 25.4*  MCV 95.5 95.3 95.1  MCH 32.2 31.3 31.5  MCHC 33.7 32.8 33.1  RDW 15.8* 15.7* 15.9*  PLT 148* 146* 153   BNP    Component Value Date/Time   BNP 50.0 05/12/2019 1406   Lipids  Lab Results  Component Value Date   CHOL 178 05/13/2019   HDL 28 (L) 05/13/2019   LDLCALC 103 (H) 05/13/2019   TRIG 236 (H) 05/13/2019   CHOLHDL 6.4 05/13/2019    HbA1c  Lab Results  Component Value Date   HGBA1C 5.6 05/13/2019    Radiology    DG Chest Port 1 View  Result Date: 10/25/2021 CLINICAL DATA:  Cough, shortness of breath EXAM: PORTABLE CHEST 1 VIEW COMPARISON:  10/23/2021 FINDINGS:  Right-sided Port-A-Cath in satisfactory position. Calcified right upper lobe pulmonary nodule likely reflecting sequela prior granulomatous disease. Right lower lobe hazy airspace disease concerning for atelectasis versus pneumonia. No pleural effusion or pneumothorax. Heart and mediastinal contours are unremarkable. No acute osseous abnormality. IMPRESSION: 1. Right lower lobe hazy airspace disease concerning for atelectasis versus pneumonia. Electronically Signed   By: Kathreen Devoid M.D.   On: 10/25/2021 11:42   DG CHEST PORT 1 VIEW  Result Date: 10/23/2021 CLINICAL DATA:  Fever EXAM: PORTABLE CHEST 1 VIEW COMPARISON:  Previous studies including the examination of 06/03/2021 FINDINGS: Transverse diameter of heart is increased. There are no signs of pulmonary edema. Increased markings are seen in right lower lung field. Rest of the lung fields are clear. There is minimal blunting of right lateral CP angle. There is no pneumothorax. Tip of right IJ chest port is seen in superior vena cava. IMPRESSION: Cardiomegaly. There are no signs of pulmonary edema. Radiograph increased markings are seen in medial right lower lung fields suggesting atelectasis/pneumonia. Electronically Signed   By: Elmer Picker M.D.   On: 10/23/2021 16:37    Telemetry    Atrial fibrillation/flutter, 80-100- Personally Reviewed  Cardiac Studies   2D Echocardiogram 7.23.2023  1. Left ventricular ejection fraction, by estimation, is 55 to 60%. The  left ventricle has normal function. Left ventricular endocardial border  not optimally defined to evaluate regional wall motion. Left ventricular  diastolic parameters are  indeterminate.   2. Right ventricular systolic function is normal. The right ventricular  size is normal. Tricuspid regurgitation signal is inadequate for assessing  PA pressure.   3. The mitral valve is normal in structure. No evidence of mitral valve  regurgitation. No evidence of mitral stenosis.   4.  The aortic valve is normal in structure. Aortic valve regurgitation is  not visualized. No aortic stenosis is present.   5. Technically difficult study due to poor echo windows, no subcostal  window and suboptimal apical window. In addition, A-fib with RVR is noted.   Patient Profile     81 y.o. female with a history of metastatic colon cancer, prior stroke, hypertension, and hyperlipidemia, who was admitted July 20 for ileostomy takedown and incisional hernia repair and subsequently developed atrial fibrillation with rapid ventricular response associated with hypotension.  Assessment & Plan    1.  Paroxysmal atrial fibrillation/flutter: Patient presented for elective ileostomy takedown and incisional hernia repair 2 days postoperatively developed rapid atrial fibrillation associated with hypotension.  She was initially  managed with amiodarone however, in the setting of inability to initiate oral anticoagulation, this was discontinued on July 24.  Normal LV function by echocardiogram without significant valvular disease.  She is currently rate controlled on oral beta-blocker therapy with soft but stable blood pressures.  Metoprolol held last night and early this AM.  May need to consider 12.5 mg BID when we consolidate tomorrow.  Medicine team initiated therapeutic dosing Lovenox today.  Provided that H&H stable, would plan to transition to oral Eliquis 5 mg twice daily tomorrow.  CHA2DS2-VASc equals 6.  Provided that patient remains minimally symptomatic, we can look to anticoagulate for 3 to 4 weeks prior to pursuing outpatient elective cardioversion if she remains in A-fib/flutter in the outpatient setting.  2.  Essential hypertension: Blood pressure soft.  Continue low-dose metoprolol in the setting of above.  3.  Stage IV colon cancer: Receiving chemotherapy in the outpatient setting.  Per medicine team.  4.  Acute on chronic stage III kidney disease: BUN and creatinine improved to 31/1.27 this  morning.  Follow-up.  Signed, Murray Hodgkins, NP  10/26/2021, 10:21 AM    For questions or updates, please contact   Please consult [email or web address] for contact info under Cardiology/STEMI.

## 2021-10-27 ENCOUNTER — Inpatient Hospital Stay: Payer: Medicare HMO

## 2021-10-27 DIAGNOSIS — I4891 Unspecified atrial fibrillation: Secondary | ICD-10-CM | POA: Diagnosis not present

## 2021-10-27 LAB — BASIC METABOLIC PANEL
Anion gap: 7 (ref 5–15)
BUN: 36 mg/dL — ABNORMAL HIGH (ref 8–23)
CO2: 18 mmol/L — ABNORMAL LOW (ref 22–32)
Calcium: 8.1 mg/dL — ABNORMAL LOW (ref 8.9–10.3)
Chloride: 107 mmol/L (ref 98–111)
Creatinine, Ser: 1.2 mg/dL — ABNORMAL HIGH (ref 0.44–1.00)
GFR, Estimated: 46 mL/min — ABNORMAL LOW (ref >60)
Glucose, Bld: 102 mg/dL — ABNORMAL HIGH (ref 70–99)
Potassium: 3.4 mmol/L — ABNORMAL LOW (ref 3.5–5.1)
Sodium: 132 mmol/L — ABNORMAL LOW (ref 135–145)

## 2021-10-27 LAB — CBC
HCT: 25.4 % — ABNORMAL LOW (ref 36.0–46.0)
Hemoglobin: 8.2 g/dL — ABNORMAL LOW (ref 12.0–15.0)
MCH: 31.1 pg (ref 26.0–34.0)
MCHC: 32.3 g/dL (ref 30.0–36.0)
MCV: 96.2 fL (ref 80.0–100.0)
Platelets: 164 10*3/uL (ref 150–400)
RBC: 2.64 MIL/uL — ABNORMAL LOW (ref 3.87–5.11)
RDW: 16.1 % — ABNORMAL HIGH (ref 11.5–15.5)
WBC: 5.4 10*3/uL (ref 4.0–10.5)
nRBC: 0 % (ref 0.0–0.2)

## 2021-10-27 MED ORDER — METOPROLOL TARTRATE 25 MG PO TABS
12.5000 mg | ORAL_TABLET | Freq: Two times a day (BID) | ORAL | Status: DC
  Administered 2021-10-27 – 2021-10-28 (×2): 12.5 mg via ORAL
  Filled 2021-10-27 (×2): qty 1

## 2021-10-27 MED ORDER — SODIUM CHLORIDE 0.9 % IV BOLUS
500.0000 mL | Freq: Once | INTRAVENOUS | Status: AC
  Administered 2021-10-27: 500 mL via INTRAVENOUS

## 2021-10-27 MED ORDER — MIDODRINE HCL 5 MG PO TABS
5.0000 mg | ORAL_TABLET | Freq: Once | ORAL | Status: AC
  Administered 2021-10-27: 5 mg via ORAL
  Filled 2021-10-27: qty 1

## 2021-10-27 MED ORDER — AMOXICILLIN-POT CLAVULANATE 875-125 MG PO TABS
1.0000 | ORAL_TABLET | Freq: Two times a day (BID) | ORAL | Status: DC
Start: 2021-10-27 — End: 2021-10-29
  Administered 2021-10-27 – 2021-10-28 (×3): 1 via ORAL
  Filled 2021-10-27 (×3): qty 1

## 2021-10-27 MED ORDER — SODIUM CHLORIDE 0.9 % IV BOLUS
1000.0000 mL | Freq: Once | INTRAVENOUS | Status: AC
Start: 2021-10-27 — End: 2021-10-28
  Administered 2021-10-27: 1000 mL via INTRAVENOUS

## 2021-10-27 NOTE — Progress Notes (Signed)
Physical Therapy Treatment Patient Details Name: Jenna [name] MRN: [id number] DOB: [birth year] Today's Date: 10/27/2021   History of Present Illness Jenna [name] is a 81 year old female with known history of adenocarcinoma of the descending colon causing large bowel obstruction requiring emergent laparotomy and subtotal colectomy with end ileostomy by Dr. Celine Ahr on August 2022, she did Perforate.  She had 29 nodes that were negative for metastatic disease.  She has been followed by oncology and recent PET/CT is consistent with metastatic disease to the liver.  There is also lung lesion c/w metastasis. Patient is S/P ileostomy take down, hernia repair and abdominal wall reconstruction 10/24/2021. Patient now with ileus 10/27/21.    PT Comments    Patient received in bed, she reports she is very tired, but agrees to PT session. Daughter at bedside. She requires min A for bed mobility with cues and HOB elevated. Patient requires increased time during session due to fatigue and needing rest breaks and also due to management of incontinence with mobility. She required min A for sit to stand and min guard/A for ambulation in room with RW. Patient will continue to benefit from skilled PT while here to improve strength, functional independence and safety with mobility.     Recommendations for follow up therapy are one component of a multi-disciplinary discharge planning process, led by the attending physician.  Recommendations may be updated based on patient status, additional functional criteria and insurance authorization.  Follow Up Recommendations  Acute inpatient rehab (3hours/day)     Assistance Recommended at Discharge Frequent or constant Supervision/Assistance  Patient can return home with the following A little help with walking and/or transfers;A little help with bathing/dressing/bathroom;Help with stairs or ramp for entrance;Assist for transportation;Assistance with cooking/housework   Equipment  Recommendations  None recommended by PT    Recommendations for Other Services       Precautions / Restrictions Precautions Precautions: Fall Restrictions Weight Bearing Restrictions: No Other Position/Activity Restrictions: Patient does not require abdominal binder for mobility, but may benefit and help with pain due to abdominal incisions, low BP.     Mobility  Bed Mobility Overal bed mobility: Needs Assistance Bed Mobility: Supine to Sit, Sit to Supine     Supine to sit: HOB elevated, Min assist Sit to supine: HOB elevated, Min assist   General bed mobility comments: No abdominal binder was ordered yesterday. Spoke with RN about this and one is being ordered today. Not imperitive to have on for mobility, but may help with supporting during mobility and may help improve BP with standing.    Transfers Overall transfer level: Needs assistance Equipment used: Rolling walker (2 wheels) Transfers: Sit to/from Stand Sit to Stand: Min guard           General transfer comment: cues for hand placement    Ambulation/Gait Ambulation/Gait assistance: Min guard Gait Distance (Feet): 35 Feet Assistive device: Rolling walker (2 wheels) Gait Pattern/deviations: Step-through pattern Gait velocity: WNL     General Gait Details: slightly impulsive this session, just wants to move. No dizziness reported during session. Continues to require diaper with mobility due to severe incontinence.   Stairs             Wheelchair Mobility    Modified Rankin (Stroke Patients Only)       Balance Overall balance assessment: Needs assistance Sitting-balance support: Feet supported Sitting balance-Leahy Scale: Good     Standing balance support: Bilateral upper extremity supported, During functional activity, Reliant on assistive device for balance  Standing balance-Leahy Scale: Fair Standing balance comment: she is able to stand briefly without UE support. Fatigues easily in  standing                            Cognition Arousal/Alertness: Awake/alert Behavior During Therapy: WFL for tasks assessed/performed Overall Cognitive Status: Within Functional Limits for tasks assessed                                 General Comments: patient tired. Has not slept well in 2 nights due to staff coming in all the time. Motivated to improve. Daughter at bedside and is very helpful and involved.        Exercises      General Comments        Pertinent Vitals/Pain Pain Assessment Pain Assessment: Faces Faces Pain Scale: Hurts little more Pain Location: no pain at rest, min abdominal pain with movement Pain Descriptors / Indicators: Discomfort, Sore Pain Intervention(s): Monitored during session, Repositioned    Home Living                          Prior Function            PT Goals (current goals can now be found in the care plan section) Acute Rehab PT Goals Patient Stated Goal: to return home, daughter not sure that she can manage the situation at home. Possibly go to rehab. PT Goal Formulation: With patient/family Time For Goal Achievement: 11/07/21 Potential to Achieve Goals: Good Progress towards PT goals: Progressing toward goals    Frequency    Min 2X/week      PT Plan Discharge plan needs to be updated    Co-evaluation              AM-PAC PT "6 Clicks" Mobility   Outcome Measure  Help needed turning from your back to your side while in a flat bed without using bedrails?: A Little Help needed moving from lying on your back to sitting on the side of a flat bed without using bedrails?: A Little Help needed moving to and from a bed to a chair (including a wheelchair)?: A Little Help needed standing up from a chair using your arms (e.g., wheelchair or bedside chair)?: A Little Help needed to walk in hospital room?: A Lot Help needed climbing 3-5 steps with a railing? : A Lot 6 Click Score: 16     End of Session Equipment Utilized During Treatment: Gait belt Activity Tolerance: Patient limited by fatigue Patient left: in bed;with call bell/phone within reach;with family/visitor present Nurse Communication: Mobility status;Other (comment) (abdominal binder) PT Visit Diagnosis: Other abnormalities of gait and mobility (R26.89);Muscle weakness (generalized) (M62.81);Difficulty in walking, not elsewhere classified (R26.2);Pain Pain - part of body:  (abdomen)     Time: 5697-9480 PT Time Calculation (min) (ACUTE ONLY): 45 min  Charges:  $Gait Training: 8-22 mins $Therapeutic Activity: 8-22 mins                     Darious Rehman, PT, GCS 10/27/21,1:52 PM

## 2021-10-27 NOTE — Progress Notes (Signed)
Inpatient Rehab Admissions Coordinator Note:   Per updated PT recommendations, patient was screened for CIR candidacy by Michel Santee, PT. At this time, pt appears to be a potential candidate for CIR. I will place an order for rehab consult for full assessment, per our protocol.  Please contact me any with questions.Shann Medal, PT, DPT [phone] 10/27/21 3:31 PM

## 2021-10-27 NOTE — Progress Notes (Addendum)
Pt BP at 83/55 MAP 65 HR 91. NP Foust made aware. Will contineu to monitor.  Update 0112: NP  Foust placed order. Will continue to monitor.  Update 0245: 500 ml bolus 0.9 sodium chloride completed. Will continue to monitor.  Update 0247: Pt BP at 82/45 MAP 57 after 500 ml bolus.. NP Foust made aware. Will continue to monitor.  Update 0257:  NP placed order. Will continue to monitor.  Yodate 0450: 500 ml bolus 0.9 sodium chloride completed. Will continue to monitor.  Update 0547: Pt BP at 84/ 52 MAP 63 after bolus and midodrine. Pt asymptomatic. NP Foust made aware. Will continue monitor.  Update 0559: NP placed order. Will continue to monitor.

## 2021-10-27 NOTE — Plan of Care (Signed)
  Problem: Health Behavior/Discharge Planning: Goal: Ability to manage health-related needs will improve Outcome: Progressing   Problem: Clinical Measurements: Goal: Will remain free from infection Outcome: Progressing   Problem: Clinical Measurements: Goal: Respiratory complications will improve Outcome: Progressing   Problem: Clinical Measurements: Goal: Cardiovascular complication will be avoided Outcome: Progressing   Problem: Elimination: Goal: Will not experience complications related to bowel motility Outcome: Progressing   Problem: Pain Managment: Goal: General experience of comfort will improve Outcome: Progressing   Problem: Safety: Goal: Ability to remain free from injury will improve Outcome: Progressing

## 2021-10-27 NOTE — Progress Notes (Signed)
Cardiology Progress Note   Patient Name: Jenna [name] Date of Encounter: 10/27/2021  Primary Cardiologist: Kate Sable, MD  Subjective   Mild lower abdominal tenderness.  Has noted some drainage from most superior incision.  Remains in A-fib/flutter.  Hypotension has resulted in holding of some doses of metoprolol though despite this, rates have been reasonably well controlled.  She did not ambulate at all yesterday.  Family are at bedside.  Inpatient Medications    Scheduled Meds:  acetaminophen  1,000 mg Oral Q6H   allopurinol  100 mg Oral QPM   amoxicillin-clavulanate  1 tablet Oral Q12H   vitamin C  500 mg Oral BID   azithromycin  250 mg Oral Daily   Chlorhexidine Gluconate Cloth  6 each Topical Daily   enoxaparin (LOVENOX) injection  1 mg/kg Subcutaneous Q12H   levothyroxine  50 mcg Oral Daily   loperamide  4 mg Oral TID   metoprolol tartrate  12.5 mg Oral BID   multivitamin with minerals  1 tablet Oral Daily   nutrition supplement (JUVEN)  1 packet Oral BID BM   pantoprazole (PROTONIX) IV  40 mg Intravenous Q12H   pregabalin  50 mg Oral TID   Ensure Max Protein  11 oz Oral QHS   sodium chloride flush  10-40 mL Intracatheter Q12H   zinc sulfate  220 mg Oral Daily   Continuous Infusions:  sodium chloride Stopped (10/23/21 0003)   sodium chloride 50 mL/hr at 10/27/21 0540   methocarbamol (ROBAXIN) IV     PRN Meds: sodium chloride, diphenhydrAMINE **OR** diphenhydrAMINE, ipratropium-albuterol, melatonin, methocarbamol **OR** methocarbamol (ROBAXIN) IV, morphine injection, ondansetron **OR** ondansetron (ZOFRAN) IV, oxyCODONE, prochlorperazine **OR** prochlorperazine, sodium chloride flush   Vital Signs    Vitals:   10/27/21 0540 10/27/21 0544 10/27/21 0647 10/27/21 0745  BP: (!) 84/52 (!) 81/52 (!) 85/47 (!) 96/52  Pulse:    97  Resp: 18 18  20   Temp: 98.7 F (37.1 C)   97.6 F (36.4 C)  TempSrc: Oral   Oral  SpO2: 96%   97%  Weight:      Height:         Intake/Output Summary (Last 24 hours) at 10/27/2021 1026 Last data filed at 10/27/2021 1023 Gross per 24 hour  Intake 1437.07 ml  Output 41 ml  Net 1396.07 ml   Filed Weights   10/14/2021 0807  Weight: 71.7 kg    Physical Exam   GEN: Well nourished, well developed, in no acute distress.  HEENT: Grossly normal.  Neck: Supple, no JVD, carotid bruits, or masses. Cardiac: Irregularly irregular, no murmurs, rubs, or gallops. No clubbing, cyanosis, edema.  Radials 2+, DP/PT 2+ and equal bilaterally.  Respiratory:  Respirations regular and unlabored, clear to auscultation bilaterally. GI: Soft, mild lower abdominal tenderness.  Mild drainage on dressing.  Serosanguineous drainage in JP drains.  Nondistended, BS + x 4. MS: no deformity or atrophy. Skin: warm and dry, no rash. Neuro:  Strength and sensation are intact. Psych: AAOx3.  Normal affect.  Labs    Chemistry Recent Labs  Lab 10/24/21 0829 10/25/21 0410 10/26/21 0550  NA 133* 133* 135  K 3.9 3.7 3.6  CL 105 106 108  CO2 19* 20* 17*  GLUCOSE 117* 119* 104*  BUN 20 20 31*  CREATININE 1.61* 1.54* 1.27*  CALCIUM 8.5* 8.3* 8.3*  PROT 5.7* 5.5*  --   ALBUMIN 2.9* 2.6*  --   AST 16 12*  --   ALT 13  9  --   ALKPHOS 46 49  --   BILITOT 0.4 0.6  --   GFRNONAA 33* 34* 43*  ANIONGAP 9 7 10      Hematology Recent Labs  Lab 10/24/21 0829 10/25/21 0410 10/26/21 0550  WBC 8.0 7.5 3.7*  RBC 2.89* 2.75* 2.67*  HGB 9.3* 8.6* 8.4*  HCT 27.6* 26.2* 25.4*  MCV 95.5 95.3 95.1  MCH 32.2 31.3 31.5  MCHC 33.7 32.8 33.1  RDW 15.8* 15.7* 15.9*  PLT 148* 146* 153     BNP    Component Value Date/Time   BNP 50.0 05/12/2019 1406   Lipids  Lab Results  Component Value Date   CHOL 178 05/13/2019   HDL 28 (L) 05/13/2019   LDLCALC 103 (H) 05/13/2019   TRIG 236 (H) 05/13/2019   CHOLHDL 6.4 05/13/2019    HbA1c  Lab Results  Component Value Date   HGBA1C 5.6 05/13/2019    Radiology    DG Chest Port 1  View  Result Date: 10/25/2021 CLINICAL DATA:  Cough, shortness of breath EXAM: PORTABLE CHEST 1 VIEW COMPARISON:  10/23/2021 FINDINGS: Right-sided Port-A-Cath in satisfactory position. Calcified right upper lobe pulmonary nodule likely reflecting sequela prior granulomatous disease. Right lower lobe hazy airspace disease concerning for atelectasis versus pneumonia. No pleural effusion or pneumothorax. Heart and mediastinal contours are unremarkable. No acute osseous abnormality. IMPRESSION: 1. Right lower lobe hazy airspace disease concerning for atelectasis versus pneumonia. Electronically Signed   By: Kathreen Devoid M.D.   On: 10/25/2021 11:42   DG CHEST PORT 1 VIEW  Result Date: 10/23/2021 CLINICAL DATA:  Fever EXAM: PORTABLE CHEST 1 VIEW COMPARISON:  Previous studies including the examination of 06/03/2021 FINDINGS: Transverse diameter of heart is increased. There are no signs of pulmonary edema. Increased markings are seen in right lower lung field. Rest of the lung fields are clear. There is minimal blunting of right lateral CP angle. There is no pneumothorax. Tip of right IJ chest port is seen in superior vena cava. IMPRESSION: Cardiomegaly. There are no signs of pulmonary edema. Radiograph increased markings are seen in medial right lower lung fields suggesting atelectasis/pneumonia. Electronically Signed   By: Elmer Picker M.D.   On: 10/23/2021 16:37    Telemetry    Atrial fibrillation, 80-90- Personally Reviewed  Cardiac Studies   2D Echocardiogram 7.23.2023   1. Left ventricular ejection fraction, by estimation, is 55 to 60%. The  left ventricle has normal function. Left ventricular endocardial border  not optimally defined to evaluate regional wall motion. Left ventricular  diastolic parameters are  indeterminate.   2. Right ventricular systolic function is normal. The right ventricular  size is normal. Tricuspid regurgitation signal is inadequate for assessing  PA pressure.    3. The mitral valve is normal in structure. No evidence of mitral valve  regurgitation. No evidence of mitral stenosis.   4. The aortic valve is normal in structure. Aortic valve regurgitation is  not visualized. No aortic stenosis is present.   5. Technically difficult study due to poor echo windows, no subcostal  window and suboptimal apical window. In addition, A-fib with RVR is noted.   Patient Profile     81 y.o. female with a history of metastatic colon cancer, prior stroke, hypertension, and hyperlipidemia, who was admitted July 20 for ileostomy takedown and incisional hernia repair and subsequently developed atrial fibrillation with rapid ventricular response associated with hypotension - now on midodrine.  Assessment & Plan    1.  Paroxysmal atrial fibrillation/flutter: Patient presented for elective ileostomy takedown and incisional hernia repair and 2 days postoperatively, developed rapid atrial fibrillation associate with hypotension.  She was initially managed with amiodarone however, in the setting of inability to initiate oral anticoagulation, this was discontinued on July 24.  Echo showed normal LV function without significant valvular disease.  She has been rate controlled on beta-blocker therapy though in the setting of hypotension, she is only been receiving about 2 doses a day.  In this setting, we will consolidate to metoprolol 12.5 mg twice daily.  Therapeutic dosing Lovenox was initiated yesterday.  Labs pending this morning.  If H&H stable, recommend starting Eliquis 5 mg twice daily (CHA2DS2-VASc equals 6).  Provided that rates are stable and she remains minimally symptomatic, we can like to anticoagulate for 3 to 4 weeks prior to pursuing outpatient elective cardioversion if necessary.  2.  Essential hypertension/hypotension: Pressures at times running in the 80s.  Now on midodrine.  Reducing metoprolol dosing to 12.5 mg twice daily with hold parameters for systolic less  than 90.  3.  Stage IV colon cancer: Receiving chemotherapy outpatient setting.  Per medicine team.  4.  Acute on chronic stage III kidney disease: Renal function stable July 25.  5.  Status post incisional hernia repair and ileostomy takedown: Per surgical team.  Signed, Murray Hodgkins, NP  10/27/2021, 10:26 AM    For questions or updates, please contact   Please consult [email or web address] for contact info under Cardiology/STEMI.

## 2021-10-27 NOTE — Progress Notes (Signed)
   10/27/21 0039  Assess: MEWS Score  Temp 98.7 F (37.1 C)  BP (!) 83/55  MAP (mmHg) 65  ECG Heart Rate 94  Resp 18  SpO2 98 %  O2 Device Room Air  Assess: MEWS Score  MEWS Temp 0  MEWS Systolic 1  MEWS Pulse 0  MEWS RR 0  MEWS LOC 0  MEWS Score 1  MEWS Score Color Green  Assess: if the MEWS score is Yellow or Red  Were vital signs taken at a resting state? Yes  Focused Assessment Change from prior assessment (see assessment flowsheet)  Does the patient meet 2 or more of the SIRS criteria? No  Does the patient have a confirmed or suspected source of infection? No  Treat  Pain Score 0  Take Vital Signs  Increase Vital Sign Frequency  Yellow: Q 2hr X 2 then Q 4hr X 2, if remains yellow, continue Q 4hrs  Escalate  MEWS: Escalate Yellow: discuss with charge nurse/RN and consider discussing with provider and RRT (Discuss with provider)  Notify: Provider  Provider Name/Title Neomia Glass NP  Date Provider Notified 10/27/21  Time Provider Notified 0133  Method of Notification Page  Notification Reason Critical result  Provider response At bedside;See new orders  Date of Provider Response 10/27/21  Time of Provider Response 0112  Assess: SIRS CRITERIA  SIRS Temperature  0  SIRS Pulse 1  SIRS Respirations  0  SIRS WBC 1  SIRS Score Sum  2

## 2021-10-27 NOTE — Progress Notes (Signed)
[place] [facility] Day(s): 6.   Post op day(s): 6 Days Post-Op.   Interval History:  Patient seen and examined Issues with soft BP overnight; boluses received Rate controlled; cardiology now on-board; on Metoprolol only for rate control; echo with EF 55-60% Patient reports she is doing okay; tired as she was awoken every few hours last night No abdominal pain, fever, chills, nausea, emesis  Labs are pending; awaiting collection  Surgical Drains:             - LLQ: 25 ccs; serosanguinous             - RLQ (retrorectus): 15 ccs; serosanguinous She is on a soft diet; tolerating Having BM; loose Worked with therapies; HHPT  Vital signs in last 24 hours: [min-max] current  Temp:  [97.9 F (36.6 C)-98.7 F (37.1 C)] 98.7 F (37.1 C) (07/26 0540) Pulse Rate:  [69-106] 98 (07/26 0440) Resp:  [16-20] 18 (07/26 0544) BP: (78-115)/(45-72) 85/47 (07/26 0647) SpO2:  [95 %-98 %] 96 % (07/26 0540)     Height: 5\' 4"  (162.6 cm) Weight: 71.7 kg BMI (Calculated): 27.11   Intake/Output last 2 shifts:  07/25 0701 - 07/26 0700 In: 1467.1 [P.O.:1080; I.V.:387.1] Out: 41 [Urine:1; Drains:40]   Physical Exam:  Constitutional: alert, cooperative and no distress  Respiratory: breathing non-labored at rest  Cardiovascular: rate regular, irregular Gastrointestinal: Soft, non-tender, non-distended, no rebound/guarding. Drains in RLQ/LLQ; both serous Integumentary: Prevena removed; laparotomy and previous ileostomy site loosely approximated with staples, there is some serous drainage from between staples, no significant drainage appreciable with palpation, no erythema  Labs:     Latest Ref Rng & Units 10/26/2021    5:50 AM 10/25/2021    4:10 AM 10/24/2021    8:29 AM  CBC  WBC 4.0 - 10.5 K/uL 3.7  7.5  8.0   Hemoglobin 12.0 - 15.0 g/dL 8.4  8.6  9.3   Hematocrit 36.0 - 46.0 % 25.4  26.2  27.6   Platelets 150 - 400 K/uL 153  146  148       Latest Ref Rng  & Units 10/26/2021    5:50 AM 10/25/2021    4:10 AM 10/24/2021    8:29 AM  CMP  Glucose 70 - 99 mg/dL 104  119  117   BUN 8 - 23 mg/dL 31  20  20    Creatinine 0.44 - 1.00 mg/dL 1.27  1.54  1.61   Sodium 135 - 145 mmol/L 135  133  133   Potassium 3.5 - 5.1 mmol/L 3.6  3.7  3.9   Chloride 98 - 111 mmol/L 108  106  105   CO2 22 - 32 mmol/L 17  20  19    Calcium 8.9 - 10.3 mg/dL 8.3  8.3  8.5   Total Protein 6.5 - 8.1 g/dL  5.5  5.7   Total Bilirubin 0.3 - 1.2 mg/dL  0.6  0.4   Alkaline Phos 38 - 126 U/L  49  46   AST 15 - 41 U/L  12  16   ALT 0 - 44 U/L  9  13      Imaging studies: No new pertinent imaging studies   Assessment/Plan:  81 y.o. female 6 Days Post-Op s/p ileostomy takedown, abdominal wall reconstruction, and placement of Prevena vac   - Okay to continue soft diet from surgical perspective  - Monitor abdominal examination; on-going bowel function; imodium on board to help with diarrhea  -  Pain control prn; antiemetics prn  - Prevena discontinued; wound can be open to air; okay for superficial dressing as need if she has some drainage  - Continue surgical drains for now; monitor and record output; suspect we can remove RLQ drain before discharge at a minimum  - Appreciate cardiology assistance; check H&H. If Hgb stable, we can start Eliquis today   - Working with therapies; recommending HH PT; daughter still nervous about her functional status  - Further management per primary service; we will follow   - Discharge Planning: Nothing to add from surgical perspective, no rush for discharge. Issues with BP overnight, may warrant another 24 hours observation.   All of the above findings and recommendations were discussed with the patient, patient's family (daughter at bedside), and the medical team, and all of their questions were answered to their expressed satisfaction.  -- Edison Simon, PA-C [facility] 10/27/2021, 7:17 AM M-F: 7am - 4pm

## 2021-10-27 NOTE — Progress Notes (Signed)
PROGRESS NOTE    Jenna [name]  KJZ:[id number] DOB: 1940-04-27  DOA: 10/02/2021 Date of Service: 10/27/21 PCP: [facility]     Brief Narrative / Hospital Course:   81 year old female with past medical history of colon cancer (adenocarcinoma with large bowel obstruction requiring emergent laparotomy and subtotal colectomy in August 2022), hypothyroidism and hyperlipidemia admitted on 7/20 by general surgery for colostomy reversal and abdominal wall reconstruction who developed new onset paroxysmal atrial fibrillation on 7/22.  Recent PET scan discovered metastases to liver and lung.   Hospitalists were consulted for management of atrial fibrillation.  Patient started on IV amiodarone drip. Transitioned off and rate has been controlled. Cardiology consulted given hypotensive. Started low dose metoprolol. BP remained low, pt asymptomatic but not ambulating (though was able to stand briefly 07/25). Have had to hold metoprolol at tiems d/t hypotension. 07/26: plan for lower dose metoprolol 12.5 mg bid. If H/H stable recommending Eliquis 5 mg bid for 3-4 weeks and consider outpatient General surgery is monitoring as well. Concern for ileus but no evidence of serious complications. Not appropriate for discharge as of today 10/27/21    Subjective: Patient reports feeling okay today, cough is improving, no CP/SOB. Small BM few days ago. Pain is controlled.      ASSESSMENT & PLAN:   Principal Problem:   S/P colostomy takedown Active Problems:   Paroxysmal atrial fibrillation with RVR (HCC)   Acute kidney injury superimposed on stage IIIa CKD ([place])   Metastatic colon cancer to liver [facility])   Essential hypertension   Hyperlipidemia   Acquired hypothyroidism   Overweight (BMI 25.0-29.9)   Atrial fibrillation (HCC)  Paroxysmal atrial fibrillation with RVR ([place]) CHADS2VASC 6. Off amio/dilt d/t hypotension. Echocardiogram done 7/23, preserved EF, unable to assess diastolic  function. Previous hospitalist discussed with general surgery.  Until she is further out from surgery, would not start full dose anticoagulation. Cardiology started low-dose metoprolol and patient has tolerated this well but BP remains soft.  Essential hypertension, now hypotensive Currently hypotensive in part due to atrial fibrillation and being dry.   Home antihypertensives held Cardiology following, started low-dose metoprolol BP remains a bit soft, continuing to monitor through today/tomorrow  Cough Abnormal CXR concerning for RLL pneumonia Community Acquired Pneumonia Smoker - interested in quitting Starting Abx 10/25/21 a/ augmentin + azithro  Duonebs Pt declined nicotine replacement    S/P colostomy takedown Postoperative ileus Management as per surgery.  Acquired hypothyroidism Continue Synthroid  Acute kidney injury superimposed on stage IIIa CKD ([place]) Improving.   Continue gentle IV fluids.  Lab Results  Component Value Date   CREATININE 1.20 (H) 10/27/2021   CREATININE 1.27 (H) 10/26/2021   CREATININE 1.54 (H) 10/25/2021   Essential hypertension Currently hypotensive in part due to atrial fibrillation and being dry.   Home antihypertensives held Cardiology following, started low-dose metoprolol BP remains a bit soft, continuing to monitor through today/tomorrow  Hyperlipidemia Continue statin  Metastatic colon cancer to liver ([place]) Known history of adenocarcinoma of the colon causing large bowel obstruction requiring emergent laparotomy and subtotal colectomy with bowel perforation.  Recent PET scan found metastases to liver and lung.  Patient following with oncology as outpatient.  Overweight (BMI 25.0-29.9) Meets criteria for BMI greater than 25  DVT prophylaxis: SCD and Lovenox Code Status: FULL Family Communication: daughter at bedside  Dispo: PT has recommended HH PT.              Objective: Vitals:   10/27/21 0544 10/27/21  1572  10/27/21 0745 10/27/21 1153  BP: (!) 81/52 (!) 85/47 (!) 96/52 (!) 99/52  Pulse:   97 97  Resp: 18  20 18   Temp:   97.6 F (36.4 C) 98.2 F (36.8 C)  TempSrc:   Oral Oral  SpO2:   97% 99%  Weight:      Height:        Intake/Output Summary (Last 24 hours) at 10/27/2021 1227 Last data filed at 10/27/2021 1023 Gross per 24 hour  Intake 1437.07 ml  Output 41 ml  Net 1396.07 ml    Filed Weights   10/02/2021 0807  Weight: 71.7 kg    Examination:  Constitutional:  VS as above General Appearance: alert, well-developed, well-nourished, NAD Eyes: Normal lids and conjunctive, non-icteric sclera PERRLA Ears, Nose, Mouth, Throat: Normal appearance Neck: No masses, trachea midline Respiratory: Normal respiratory effort Breath sounds clear except coarse at bilateral bases  Cardiovascular: S1/S2 normal, irreg irreg No lower extremity edema Gastrointestinal: Nontender Musculoskeletal:  No clubbing/cyanosis of digits Neurological: No cranial nerve deficit on limited exam Psychiatric: Normal judgment/insight Normal mood and affect       Scheduled Medications:   acetaminophen  1,000 mg Oral Q6H   allopurinol  100 mg Oral QPM   amoxicillin-clavulanate  1 tablet Oral Q12H   vitamin C  500 mg Oral BID   azithromycin  250 mg Oral Daily   Chlorhexidine Gluconate Cloth  6 each Topical Daily   enoxaparin (LOVENOX) injection  1 mg/kg Subcutaneous Q12H   levothyroxine  50 mcg Oral Daily   loperamide  4 mg Oral TID   metoprolol tartrate  12.5 mg Oral BID   multivitamin with minerals  1 tablet Oral Daily   nutrition supplement (JUVEN)  1 packet Oral BID BM   pantoprazole (PROTONIX) IV  40 mg Intravenous Q12H   pregabalin  50 mg Oral TID   Ensure Max Protein  11 oz Oral QHS   sodium chloride flush  10-40 mL Intracatheter Q12H   zinc sulfate  220 mg Oral Daily    Continuous Infusions:  sodium chloride Stopped (10/23/21 0003)   sodium chloride 50 mL/hr at 10/27/21 0540    methocarbamol (ROBAXIN) IV      PRN Medications:  sodium chloride, diphenhydrAMINE **OR** diphenhydrAMINE, ipratropium-albuterol, melatonin, methocarbamol **OR** methocarbamol (ROBAXIN) IV, morphine injection, ondansetron **OR** ondansetron (ZOFRAN) IV, oxyCODONE, prochlorperazine **OR** prochlorperazine, sodium chloride flush  Antimicrobials:  Anti-infectives (From admission, onward)    Start     Dose/Rate Route Frequency Ordered Stop   10/27/21 1045  amoxicillin-clavulanate (AUGMENTIN) 875-125 MG per tablet 1 tablet        1 tablet Oral Every 12 hours 10/27/21 0959 10/29/21 2359   10/26/21 1000  azithromycin (ZITHROMAX) tablet 250 mg        250 mg Oral Daily 10/25/21 1357 10/30/21 0959   10/25/21 1445  amoxicillin-clavulanate (AUGMENTIN) 500-125 MG per tablet 500 mg  Status:  Discontinued        1 tablet Oral Every 12 hours 10/25/21 1357 10/27/21 0959   10/25/21 1445  azithromycin (ZITHROMAX) tablet 500 mg        500 mg Oral  Once 10/25/21 1357 10/25/21 1544   10/25/2021 2200  cefoTEtan (CEFOTAN) 2 g in sodium chloride 0.9 % 100 mL IVPB        2 g 200 mL/hr over 30 Minutes Intravenous Every 8 hours 10/08/2021 1739 10/22/21 0639   10/15/2021 1455  sodium chloride 0.9 % with cefoTEtan (CEFOTAN) ADS Med  Note to Pharmacy: Register, Santiago Glad A: cabinet override      10/13/2021 1455 10/12/2021 1549   10/03/2021 0816  sodium chloride 0.9 % with cefoTEtan (CEFOTAN) ADS Med       Note to Pharmacy: Herby Abraham W: cabinet override      10/30/2021 0816 10/14/2021 1139   10/09/2021 0730  cefoTEtan (CEFOTAN) 2 g in sodium chloride 0.9 % 100 mL IVPB        2 g 200 mL/hr over 30 Minutes Intravenous On call to O.R. 10/12/2021 0728 10/15/2021 1527       Data Reviewed: I have personally reviewed following labs and imaging studies  CBC: Recent Labs  Lab 10/23/21 0635 10/24/21 0829 10/25/21 0410 10/26/21 0550 10/27/21 1110  WBC 4.6 8.0 7.5 3.7* 5.4  HGB 8.1* 9.3* 8.6* 8.4* 8.2*  HCT 24.7* 27.6* 26.2*  25.4* 25.4*  MCV 95.7 95.5 95.3 95.1 96.2  PLT 118* 148* 146* 153 841    Basic Metabolic Panel: Recent Labs  Lab 10/23/21 0914 10/24/21 0829 10/25/21 0410 10/26/21 0550 10/27/21 1110  NA 130* 133* 133* 135 132*  K 4.5 3.9 3.7 3.6 3.4*  CL 107 105 106 108 107  CO2 15* 19* 20* 17* 18*  GLUCOSE 102* 117* 119* 104* 102*  BUN 26* 20 20 31* 36*  CREATININE 1.97* 1.61* 1.54* 1.27* 1.20*  CALCIUM 8.1* 8.5* 8.3* 8.3* 8.1*  MG  --  2.2  --   --   --   PHOS  --  3.1  --   --   --     GFR: Estimated Creatinine Clearance: 37.5 mL/min (A) (by C-G formula based on SCr of 1.2 mg/dL (H)). Liver Function Tests: Recent Labs  Lab 10/24/21 0829 10/25/21 0410  AST 16 12*  ALT 13 9  ALKPHOS 46 49  BILITOT 0.4 0.6  PROT 5.7* 5.5*  ALBUMIN 2.9* 2.6*    No results for input(s): "LIPASE", "AMYLASE" in the last 168 hours. No results for input(s): "AMMONIA" in the last 168 hours. Coagulation Profile: Recent Labs  Lab 10/24/21 0829 10/25/21 0410  INR 1.5* 1.3*    Cardiac Enzymes: No results for input(s): "CKTOTAL", "CKMB", "CKMBINDEX", "TROPONINI" in the last 168 hours. BNP (last 3 results) No results for input(s): "PROBNP" in the last 8760 hours. HbA1C: No results for input(s): "HGBA1C" in the last 72 hours. CBG: No results for input(s): "GLUCAP" in the last 168 hours. Lipid Profile: No results for input(s): "CHOL", "HDL", "LDLCALC", "TRIG", "CHOLHDL", "LDLDIRECT" in the last 72 hours. Thyroid Function Tests: No results for input(s): "TSH", "T4TOTAL", "FREET4", "T3FREE", "THYROIDAB" in the last 72 hours. Anemia Panel: No results for input(s): "VITAMINB12", "FOLATE", "FERRITIN", "TIBC", "IRON", "RETICCTPCT" in the last 72 hours. Urine analysis:    Component Value Date/Time   COLORURINE YELLOW (A) 11/14/2020 2000   APPEARANCEUR HAZY (A) 11/14/2020 2000   LABSPEC 1.027 11/14/2020 2000   PHURINE 6.0 11/14/2020 2000   GLUCOSEU NEGATIVE 11/14/2020 2000   HGBUR NEGATIVE 11/14/2020  2000   BILIRUBINUR NEGATIVE 11/14/2020 2000   KETONESUR NEGATIVE 11/14/2020 2000   PROTEINUR NEGATIVE 11/14/2020 2000   NITRITE NEGATIVE 11/14/2020 2000   LEUKOCYTESUR MODERATE (A) 11/14/2020 2000   Sepsis Labs: @LABRCNTIP (procalcitonin:4,lacticidven:4)  No results found for this or any previous visit (from the past 240 hour(s)).       Radiology Studies last 96 hours: DG ABD ACUTE 2+V W 1V CHEST  Result Date: 10/27/2021 CLINICAL DATA:  Ileus following gastrointestinal surgery EXAM: DG ABDOMEN ACUTE WITH 1  VIEW CHEST COMPARISON:  CT done on 08/12/2021 FINDINGS: There is moderate dilation of small bowel loops measuring up to 4.5 cm in diameter. Stomach is not distended. There are metallic densities in the region of sigmoid, possibly contrast within diverticula. There is a large caliber catheter in abdomen with its tip in the pelvis, possibly a surgical drain. Skin staples are seen. There is previous left hip arthroplasty. Transverse diameter of the heart is increased. There is crowding of markings in the left parahilar region and both lower lung fields suggesting subsegmental atelectasis. There is slight improvement in the aeration of medial right lower lung field since 10/25/2021. Tip of right IJ chest port is seen in superior vena cava. IMPRESSION: There is mild-to-moderate dilation of small-bowel loops suggesting ileus. Linear densities in left mid and both lower lung fields suggest subsegmental atelectasis. Electronically Signed   By: Elmer Picker M.D.   On: 10/27/2021 12:15   DG Chest Port 1 View  Result Date: 10/25/2021 CLINICAL DATA:  Cough, shortness of breath EXAM: PORTABLE CHEST 1 VIEW COMPARISON:  10/23/2021 FINDINGS: Right-sided Port-A-Cath in satisfactory position. Calcified right upper lobe pulmonary nodule likely reflecting sequela prior granulomatous disease. Right lower lobe hazy airspace disease concerning for atelectasis versus pneumonia. No pleural effusion or  pneumothorax. Heart and mediastinal contours are unremarkable. No acute osseous abnormality. IMPRESSION: 1. Right lower lobe hazy airspace disease concerning for atelectasis versus pneumonia. Electronically Signed   By: Kathreen Devoid M.D.   On: 10/25/2021 11:42   ECHOCARDIOGRAM COMPLETE  Result Date: 10/25/2021    ECHOCARDIOGRAM REPORT   Patient Name:   SHABREKA COULON Date of Exam: 10/24/2021 Medical Rec #:  [id number]        Height:       64.0 in Accession #:    [id number]       Weight:       158.0 lb Date of Birth:  07-08-1940        BSA:          1.770 m Patient Age:    10 years         BP:           108/69 mmHg Patient Gender: F                HR:           83 bpm. Exam Location:  [facility] Procedure: 2D Echo and Intracardiac Opacification Agent Indications:     Abnormal ECG R94.31                  Atrial Fibrillation I48.91  History:         Patient has no prior history of Echocardiogram examinations.  Sonographer:     Kathlen Brunswick RDCS Referring Phys:  [id number] DIEGO F PABON Diagnosing Phys: Kathlyn Sacramento MD  Sonographer Comments: Technically difficult study due to poor echo windows, no subcostal window and suboptimal apical window. Image acquisition challenging due to respiratory motion. IMPRESSIONS  1. Left ventricular ejection fraction, by estimation, is 55 to 60%. The left ventricle has normal function. Left ventricular endocardial border not optimally defined to evaluate regional wall motion. Left ventricular diastolic parameters are indeterminate.  2. Right ventricular systolic function is normal. The right ventricular size is normal. Tricuspid regurgitation signal is inadequate for assessing PA pressure.  3. The mitral valve is normal in structure. No evidence of mitral valve regurgitation. No evidence of mitral stenosis.  4. The aortic valve is normal in structure. Aortic  valve regurgitation is not visualized. No aortic stenosis is present.  5. Technically difficult study due to poor echo windows, no  subcostal window and suboptimal apical window. In addition, A-fib with RVR is noted. FINDINGS  Left Ventricle: Left ventricular ejection fraction, by estimation, is 55 to 60%. The left ventricle has normal function. Left ventricular endocardial border not optimally defined to evaluate regional wall motion. Definity contrast agent was given IV to delineate the left ventricular endocardial borders. The left ventricular internal cavity size was normal in size. There is no left ventricular hypertrophy. Left ventricular diastolic parameters are indeterminate. Right Ventricle: The right ventricular size is normal. No increase in right ventricular wall thickness. Right ventricular systolic function is normal. Tricuspid regurgitation signal is inadequate for assessing PA pressure. Left Atrium: Left atrial size was normal in size. Right Atrium: Right atrial size was normal in size. Pericardium: There is no evidence of pericardial effusion. Mitral Valve: The mitral valve is normal in structure. No evidence of mitral valve regurgitation. No evidence of mitral valve stenosis. Tricuspid Valve: The tricuspid valve is normal in structure. Tricuspid valve regurgitation is trivial. No evidence of tricuspid stenosis. Aortic Valve: The aortic valve is normal in structure. Aortic valve regurgitation is not visualized. No aortic stenosis is present. Aortic valve peak gradient measures 6.2 mmHg. Pulmonic Valve: The pulmonic valve was normal in structure. Pulmonic valve regurgitation is not visualized. No evidence of pulmonic stenosis. Aorta: The aortic root is normal in size and structure. Venous: The inferior vena cava was not well visualized. IAS/Shunts: No atrial level shunt detected by color flow Doppler.  LEFT VENTRICLE PLAX 2D LVIDd:         4.23 cm     Diastology LVIDs:         2.93 cm     LV e' medial:    11.20 cm/s LV PW:         1.08 cm     LV E/e' medial:  6.7 LV IVS:        0.86 cm     LV e' lateral:   12.60 cm/s LVOT diam:      1.80 cm     LV E/e' lateral: 5.9 LV SV:         29 LV SV Index:   16 LVOT Area:     2.54 cm  LV Volumes (MOD) LV vol d, MOD A2C: 67.8 ml LV vol d, MOD A4C: 72.3 ml LV vol s, MOD A2C: 21.3 ml LV vol s, MOD A4C: 36.6 ml LV SV MOD A2C:     46.5 ml LV SV MOD A4C:     72.3 ml LV SV MOD BP:      41.6 ml RIGHT VENTRICLE RV Basal diam:  2.05 cm RV S prime:     12.00 cm/s TAPSE (M-mode): 1.5 cm LEFT ATRIUM             Index LA diam:        2.40 cm 1.36 cm/m LA Vol (A2C):   32.1 ml 18.14 ml/m LA Vol (A4C):   25.9 ml 14.63 ml/m LA Biplane Vol: 29.8 ml 16.84 ml/m  AORTIC VALVE                 PULMONIC VALVE AV Area (Vmax): 1.57 cm     PV Vmax:       0.87 m/s AV Vmax:        125.00 cm/s  PV Peak grad:  3.0 mmHg AV Peak  Grad:   6.2 mmHg LVOT Vmax:      77.00 cm/s LVOT Vmean:     53.000 cm/s LVOT VTI:       0.113 m  AORTA Ao Root diam: 2.90 cm MITRAL VALVE               TRICUSPID VALVE MV Area (PHT): 5.75 cm    TV Peak grad:   18.0 mmHg MV Decel Time: 132 msec    TV Vmax:        2.12 m/s MV E velocity: 74.60 cm/s MV A velocity: 51.00 cm/s  SHUNTS MV E/A ratio:  1.46        Systemic VTI:  0.11 m                            Systemic Diam: 1.80 cm Kathlyn Sacramento MD Electronically signed by Kathlyn Sacramento MD Signature Date/Time: 10/25/2021/9:50:17 AM    Final    DG CHEST PORT 1 VIEW  Result Date: 10/23/2021 CLINICAL DATA:  Fever EXAM: PORTABLE CHEST 1 VIEW COMPARISON:  Previous studies including the examination of 06/03/2021 FINDINGS: Transverse diameter of heart is increased. There are no signs of pulmonary edema. Increased markings are seen in right lower lung field. Rest of the lung fields are clear. There is minimal blunting of right lateral CP angle. There is no pneumothorax. Tip of right IJ chest port is seen in superior vena cava. IMPRESSION: Cardiomegaly. There are no signs of pulmonary edema. Radiograph increased markings are seen in medial right lower lung fields suggesting atelectasis/pneumonia. Electronically  Signed   By: Elmer Picker M.D.   On: 10/23/2021 16:37            LOS: 6 days      Emeterio Reeve, DO [facility] 10/27/2021, 12:27 PM   Staff may message me via secure chat in [place]  but this may not receive immediate response,  please page for urgent matters!  If 7PM-7AM, please contact night-coverage [email or web address]  Dictation software was used to generate the above note. Typos may occur and escape review, as with typed/written notes. Please contact Dr Sheppard Coil directly for clarity if needed.

## 2021-10-28 ENCOUNTER — Inpatient Hospital Stay: Payer: Medicare HMO

## 2021-10-28 ENCOUNTER — Other Ambulatory Visit: Payer: Self-pay

## 2021-10-28 DIAGNOSIS — Z9889 Other specified postprocedural states: Secondary | ICD-10-CM | POA: Diagnosis not present

## 2021-10-28 LAB — CBC
HCT: 24.6 % — ABNORMAL LOW (ref 36.0–46.0)
Hemoglobin: 8 g/dL — ABNORMAL LOW (ref 12.0–15.0)
MCH: 31.5 pg (ref 26.0–34.0)
MCHC: 32.5 g/dL (ref 30.0–36.0)
MCV: 96.9 fL (ref 80.0–100.0)
Platelets: 171 10*3/uL (ref 150–400)
RBC: 2.54 MIL/uL — ABNORMAL LOW (ref 3.87–5.11)
RDW: 16.3 % — ABNORMAL HIGH (ref 11.5–15.5)
WBC: 8.8 10*3/uL (ref 4.0–10.5)
nRBC: 0 % (ref 0.0–0.2)

## 2021-10-28 LAB — BASIC METABOLIC PANEL
Anion gap: 8 (ref 5–15)
BUN: 34 mg/dL — ABNORMAL HIGH (ref 8–23)
CO2: 19 mmol/L — ABNORMAL LOW (ref 22–32)
Calcium: 8.6 mg/dL — ABNORMAL LOW (ref 8.9–10.3)
Chloride: 107 mmol/L (ref 98–111)
Creatinine, Ser: 1.3 mg/dL — ABNORMAL HIGH (ref 0.44–1.00)
GFR, Estimated: 42 mL/min — ABNORMAL LOW (ref >60)
Glucose, Bld: 100 mg/dL — ABNORMAL HIGH (ref 70–99)
Potassium: 3.5 mmol/L (ref 3.5–5.1)
Sodium: 134 mmol/L — ABNORMAL LOW (ref 135–145)

## 2021-10-28 LAB — PHOSPHORUS: Phosphorus: 4.7 mg/dL — ABNORMAL HIGH (ref 2.5–4.6)

## 2021-10-28 LAB — MAGNESIUM: Magnesium: 1.9 mg/dL (ref 1.7–2.4)

## 2021-10-28 LAB — GLUCOSE, CAPILLARY: Glucose-Capillary: 107 mg/dL — ABNORMAL HIGH (ref 70–99)

## 2021-10-28 MED ORDER — LACTATED RINGERS IV BOLUS
500.0000 mL | Freq: Once | INTRAVENOUS | Status: AC
  Administered 2021-10-28: 500 mL via INTRAVENOUS

## 2021-10-28 MED ORDER — ENSURE ENLIVE PO LIQD
237.0000 mL | Freq: Two times a day (BID) | ORAL | Status: DC
  Administered 2021-10-28: 237 mL via ORAL

## 2021-10-28 MED ORDER — ENSURE ENLIVE PO LIQD
237.0000 mL | Freq: Three times a day (TID) | ORAL | Status: DC
  Administered 2021-10-28: 237 mL via ORAL

## 2021-10-28 MED ORDER — LACTATED RINGERS IV SOLN
INTRAVENOUS | Status: AC
Start: 2021-10-28 — End: 2021-10-29

## 2021-10-28 NOTE — Progress Notes (Signed)
POD # 7 A fib better HR Tolerating PO KUB some mild dilation SB ambulated No bleeding Creat stable Good UO Hemodynamics are better   PE NAD Abd: soft, incision clean and dry, no infection, no peritonitis. Drain w serous output   A/P Improving slowly Mobility and strength is the main issue No surgical issues at this time SOft diet and supplements Awaiting potential xfer to rehab

## 2021-10-28 NOTE — Progress Notes (Signed)
Inpatient Rehab Admissions:  Inpatient Rehab Consult received.  I spoke with patient and her daughter [name] on the telephone for rehabilitation assessment and to discuss goals and expectations of an inpatient rehab admission.  Both acknowledged understanding of CIR goals and expectations. Pt interested in pursuing CIR. Daughter is supportive. Daughter confirmed that she will be able to provide consistent support until mid-August. After that, pt would have intermittent support. Will continue to follow.  Signed: Gayland Curry, [place], [place] Admissions Coordinator [phone]

## 2021-10-28 NOTE — TOC Progression Note (Signed)
Transition of Care [facility]) - Progression Note    Patient Details  Name: Jenna [name] MRN: [id number] Date of Birth: 1940/11/16  Transition of Care [facility]) CM/SW Contact  Laurena Slimmer, RN Phone Number: 10/28/2021, 12:22 PM  Clinical Narrative:    Spoke with patient's daughter  regarding PT recommendations for AIR. Patient and family agreeable. Advised of pending referral.    Expected Discharge Plan: [place] Barriers to Discharge: Barriers Resolved  Expected Discharge Plan and Services Expected Discharge Plan: [place]   Discharge Planning Services: CM Consult Post Acute Care Choice: [place] arrangements for the past 2 months: Single Family Home                 DME Arranged: N/A         HH Arranged: PT HH Agency: [place] Date [place]: 10/25/21 Time [place] Agency Contacted: [id number] Representative spoke with at [place]: [place] ([place]) Interventions    Readmission Risk Interventions     No data to display

## 2021-10-28 NOTE — Progress Notes (Signed)
Nutrition Follow-up  DOCUMENTATION CODES:   Not applicable  INTERVENTION:   -Ensure Enlive po TID, each supplement provides 350 kcal and 20 grams of protein -MVI with minerals daily -Magic cup TID with meals, each supplement provides 290 kcal and 9 grams of protein  -500 mg vitamin C BID -200 mg zinc sulfate daily x 14 days -D/c Juven -D/c Ensure Max  NUTRITION DIAGNOSIS:   Increased nutrient needs related to post-op healing as evidenced by estimated needs.  Ongoing  GOAL:   Patient will meet greater than or equal to 90% of their needs  Progressing   MONITOR:   PO intake, Supplement acceptance  REASON FOR ASSESSMENT:   Malnutrition Screening Tool    ASSESSMENT:   Pt with past medical history of colon cancer (adenocarcinoma with large bowel obstruction requiring emergent laparotomy and subtotal colectomy in August 2022), hypothyroidism and hyperlipidemia admitted on 7/20 by general surgery for colostomy reversal and abdominal wall reconstruction who developed new onset paroxysmal atrial fibrillation  7/20- s/p Ileostomy take down with stapled Ileoproctostomy EEA Baker Anastomosis; Abdominal wall reconstruction with bilateral myocutaneous flaps using a TAR release and  two Psahix Meshes  in  a home plate and diamond configuration polypropylene mesh measuring 30 x 25 cm and 20x 25cms; Incisional hernia repair greater 12 cms; Wound vac Placement 34 x 2 cm ( Prevena plus)  Reviewed I/O's: +665 ml x 24 hours and +6.2 L since admission   Drain output: 275 ml x 24 hours   Spoke with pt and daughter at bedside. Pt reports decreased appetite. Per daughter, pt has been consuming a few bites at meals due to decreased appetite. Observed pt consume a few bites of grits and about 1/3 cup of ice cream. Pt likes Ensure, but states that the Juven supplements "take my appetite away". Pt and daughter are focused on getting protein in diet. They are amenable to Ensure Enlive supplements. RD  also took lunch order for pt.   Pt also complains of constipation. Last BM was about 3 days ago. Pt shares she intends to walk some today.   Discussed importance of good meal and supplement intake to promote healing.   Per chart review, pt for possible transfer to CIR once medically stable.   Labs reviewed: Na: 134, Phos: 4.7, CBGS: 100 (inpatient orders for glycemic control are none).    NUTRITION - FOCUSED PHYSICAL EXAM:  Flowsheet Row Most Recent Value  Orbital Region No depletion  Upper Arm Region Mild depletion  Thoracic and Lumbar Region No depletion  Buccal Region Mild depletion  Temple Region Mild depletion  Clavicle Bone Region No depletion  Clavicle and Acromion Bone Region No depletion  Scapular Bone Region No depletion  Dorsal Hand No depletion  Patellar Region No depletion  Anterior Thigh Region No depletion  Posterior Calf Region No depletion  Edema (RD Assessment) Moderate  Hair Reviewed  Eyes Reviewed  Mouth Reviewed  Skin Reviewed  Nails Reviewed       Diet Order:   Diet Order             DIET SOFT Room service appropriate? Yes; Fluid consistency: Thin  Diet effective now                   EDUCATION NEEDS:   No education needs have been identified at this time  Skin:  Skin Assessment: Skin Integrity Issues: Skin Integrity Issues:: Wound VAC Wound Vac: abdomen  Last BM:  10/25/21 (type 7)  Height:  Ht Readings from Last 1 Encounters:  10/23/2021 5\' 4"  (1.626 m)    Weight:   Wt Readings from Last 1 Encounters:  10/22/2021 71.7 kg    Ideal Body Weight:  54.5 kg  BMI:  Body mass index is 27.12 kg/m.  Estimated Nutritional Needs:   Kcal:  1700-1900  Protein:  95-110 grams  Fluid:  > 1.7 L    Loistine Chance, RD, LDN, [place] Registered Dietitian II Certified Diabetes Care and Education Specialist Please refer to [facility] for RD and/or RD on-call/weekend/after hours pager

## 2021-10-28 NOTE — Progress Notes (Signed)
Cross Cover [place] - Patient hypotensive and tafhycardic. AKI noted on labs. Recently placed NGT for ileus. At bedside husband reports she vomited with attempts of po fluids today.  Recorded I & O inaccurate as only drain output measured.  Nurse reports incontinence during day shift and 100 cc in [place] recently placed  Action - 500 cc LR bolus then 100 and hour. CBG monitoring every 4 hours.   Response - BP improving, MAP 66, heart rate now 104

## 2021-10-28 NOTE — Progress Notes (Signed)
PROGRESS NOTE    Jenna [name]  KGY:[id number] DOB: 1941/03/08  DOA: 10/22/2021 Date of Service: 10/28/21 PCP: [facility]     Brief Narrative / Hospital Course:   81 year old female with past medical history of colon cancer (adenocarcinoma with large bowel obstruction requiring emergent laparotomy and subtotal colectomy in August 2022), hypothyroidism and hyperlipidemia admitted on 7/20 by general surgery for colostomy reversal and abdominal wall reconstruction who developed new onset paroxysmal atrial fibrillation on 7/22.  Recent PET scan discovered metastases to liver and lung.   CARDIAC: Hospitalists were consulted for management of atrial fibrillation.  Patient started on IV amiodarone drip. Transitioned off and rate has been controlled. Cardiology consulted given hypotensive. Started low dose metoprolol. BP remained low, pt asymptomatic but not ambulating much. Have had to hold metoprolol at tiems d/t hypotension. 07/26: switching metoprolol to 12.5 mg bid and transition from lovenox to eliquis 5 mg bid at discretion of surgeon and hospitalist - cardiology has signed off. 07/27: BP low but stable.  RESPIRATORY / ID: Repeated CXR 07/24 to eval PNA given coughing: RLL findings appear progressed compared to previous CXR, initiated CAP treatment. Pt has been stable on room air, cough improving.  SURGICAL / GI: Concern for ileus but no evidence of serious complications. Not appropriate for discharge as of today 10/27/21 d/t ileus and hypotension but have begun process for possible placement in inpatient rehab when stable. 07/27: no BM x4 days, (+)nausea mid-day. Encouraged po intake and up w/ PT as much as possible.  Progression goals: resolution/improvement of postoperative ileus, pending placement hoping to get inpatient rehab approved    Subjective: Patient reports feeling okay today, cough is improving, some nausea in mid-day per RN report, no CP/SOB. Small BM 4 days  ago. Pain is controlled.      ASSESSMENT & PLAN:   Principal Problem:   S/P colostomy takedown Active Problems:   Paroxysmal atrial fibrillation with RVR (HCC)   Acute kidney injury superimposed on stage IIIa CKD ([place])   Metastatic colon cancer to liver [facility])   Essential hypertension   Hyperlipidemia   Acquired hypothyroidism   Overweight (BMI 25.0-29.9)   Atrial fibrillation (HCC)  Paroxysmal atrial fibrillation with RVR ([place]) CHADS2VASC 6. Off amio/dilt d/t hypotension. Echocardiogram done 7/23, preserved EF, unable to assess diastolic function. Previous hospitalist discussed with general surgery.  Until she is further out from surgery, would not start full dose anticoagulation. Cardiology started low-dose metoprolol and patient has tolerated this well but BP remains soft.   Essential hypertension, now hypotensive Currently hypotensive in part due to atrial fibrillation and being dry.   Home antihypertensives held Cardiology following --> s/o 07/26, started low-dose metoprolol BP remains a bit soft, continuing to monitor through today/tomorrow  Cough Abnormal CXR concerning for RLL pneumonia Community Acquired Pneumonia Smoker - interested in quitting Started Abx 10/25/21 w/ augmentin + azithro  Duonebs Pt declined nicotine replacement    S/P colostomy takedown Postoperative ileus Management as per surgery.  Acquired hypothyroidism Continue Synthroid  Acute kidney injury superimposed on stage IIIa CKD ([place]) Improving.   Encourage po fluids Lab Results  Component Value Date   CREATININE 1.30 (H) 10/28/2021   CREATININE 1.20 (H) 10/27/2021   CREATININE 1.27 (H) 10/26/2021   Essential hypertension Currently hypotensive in part due to atrial fibrillation and being dry.   Home antihypertensives held Cardiology following, started low-dose metoprolol BP remains a bit soft, continuing to monitor through today/tomorrow  Hyperlipidemia Continue statin  Metastatic  colon  cancer to liver [facility]) Known history of adenocarcinoma of the colon causing large bowel obstruction requiring emergent laparotomy and subtotal colectomy with bowel perforation.  Recent PET scan found metastases to liver and lung.  Patient following with oncology as outpatient.  Overweight (BMI 25.0-29.9) Meets criteria for BMI greater than 25  DVT prophylaxis: SCD and Lovenox Code Status: FULL Family Communication: daughter at bedside  Dispo: PT has recommended HH PT.              Objective: Vitals:   10/27/21 2324 10/28/21 0445 10/28/21 0805 10/28/21 1222  BP: (!) 100/54 99/60 (!) 96/59 105/65  Pulse: 97 95 96 90  Resp: 20 20 18 18   Temp:  98.2 F (36.8 C) 98.2 F (36.8 C) 98.1 F (36.7 C)  TempSrc:      SpO2: 97% 96% 96% 99%  Weight:      Height:        Intake/Output Summary (Last 24 hours) at 10/28/2021 1319 Last data filed at 10/28/2021 1000 Gross per 24 hour  Intake 2117.34 ml  Output 275 ml  Net 1842.34 ml   Filed Weights   10/31/2021 0807  Weight: 71.7 kg    Examination:  Constitutional:  VS as above General Appearance: alert, well-developed, well-nourished, NAD Eyes: Normal lids and conjunctive, non-icteric sclera PERRLA Ears, Nose, Mouth, Throat: Normal appearance Neck: No masses, trachea midline Respiratory: Normal respiratory effort Breath sounds clear except coarse at bilateral bases  Cardiovascular: S1/S2 normal, irreg irreg No lower extremity edema Gastrointestinal: Nontender, avoided deep palpation near surgical sites  Bowel sounds present  Musculoskeletal:  No clubbing/cyanosis of digits Neurological: No cranial nerve deficit on limited exam Psychiatric: Normal judgment/insight Normal mood and affect       Scheduled Medications:   acetaminophen  1,000 mg Oral Q6H   allopurinol  100 mg Oral QPM   amoxicillin-clavulanate  1 tablet Oral Q12H   vitamin C  500 mg Oral BID   azithromycin  250 mg Oral Daily    Chlorhexidine Gluconate Cloth  6 each Topical Daily   enoxaparin (LOVENOX) injection  1 mg/kg Subcutaneous Q12H   feeding supplement  237 mL Oral TID BM   levothyroxine  50 mcg Oral Daily   metoprolol tartrate  12.5 mg Oral BID   multivitamin with minerals  1 tablet Oral Daily   pantoprazole (PROTONIX) IV  40 mg Intravenous Q12H   pregabalin  50 mg Oral TID   sodium chloride flush  10-40 mL Intracatheter Q12H   zinc sulfate  220 mg Oral Daily    Continuous Infusions:  sodium chloride Stopped (10/23/21 0003)   methocarbamol (ROBAXIN) IV      PRN Medications:  sodium chloride, diphenhydrAMINE **OR** diphenhydrAMINE, ipratropium-albuterol, melatonin, methocarbamol **OR** methocarbamol (ROBAXIN) IV, morphine injection, ondansetron **OR** ondansetron (ZOFRAN) IV, oxyCODONE, prochlorperazine **OR** prochlorperazine, sodium chloride flush  Antimicrobials:  Anti-infectives (From admission, onward)    Start     Dose/Rate Route Frequency Ordered Stop   10/27/21 1045  amoxicillin-clavulanate (AUGMENTIN) 875-125 MG per tablet 1 tablet        1 tablet Oral Every 12 hours 10/27/21 0959 10/29/21 2359   10/26/21 1000  azithromycin (ZITHROMAX) tablet 250 mg        250 mg Oral Daily 10/25/21 1357 10/30/21 0959   10/25/21 1445  amoxicillin-clavulanate (AUGMENTIN) 500-125 MG per tablet 500 mg  Status:  Discontinued        1 tablet Oral Every 12 hours 10/25/21 1357 10/27/21 0959   10/25/21 1445  azithromycin [facility]) tablet 500 mg        500 mg Oral  Once 10/25/21 1357 10/25/21 1544   10/20/2021 2200  cefoTEtan (CEFOTAN) 2 g in sodium chloride 0.9 % 100 mL IVPB        2 g 200 mL/hr over 30 Minutes Intravenous Every 8 hours 10/30/2021 1739 10/22/21 0639   10/25/2021 1455  sodium chloride 0.9 % with cefoTEtan (CEFOTAN) ADS Med       Note to Pharmacy: Register, Santiago Glad A: cabinet override      10/24/2021 1455 10/22/2021 1549   10/13/2021 0816  sodium chloride 0.9 % with cefoTEtan (CEFOTAN) ADS Med       Note to  Pharmacy: Herby Abraham W: cabinet override      10/15/2021 0816 10/20/2021 1139   10/19/2021 0730  cefoTEtan (CEFOTAN) 2 g in sodium chloride 0.9 % 100 mL IVPB        2 g 200 mL/hr over 30 Minutes Intravenous On call to O.R. 10/08/2021 0728 10/26/2021 1527       Data Reviewed: I have personally reviewed following labs and imaging studies  CBC: Recent Labs  Lab 10/24/21 0829 10/25/21 0410 10/26/21 0550 10/27/21 1110 10/28/21 0525  WBC 8.0 7.5 3.7* 5.4 8.8  HGB 9.3* 8.6* 8.4* 8.2* 8.0*  HCT 27.6* 26.2* 25.4* 25.4* 24.6*  MCV 95.5 95.3 95.1 96.2 96.9  PLT 148* 146* 153 164 875   Basic Metabolic Panel: Recent Labs  Lab 10/24/21 0829 10/25/21 0410 10/26/21 0550 10/27/21 1110 10/28/21 0525  NA 133* 133* 135 132* 134*  K 3.9 3.7 3.6 3.4* 3.5  CL 105 106 108 107 107  CO2 19* 20* 17* 18* 19*  GLUCOSE 117* 119* 104* 102* 100*  BUN 20 20 31* 36* 34*  CREATININE 1.61* 1.54* 1.27* 1.20* 1.30*  CALCIUM 8.5* 8.3* 8.3* 8.1* 8.6*  MG 2.2  --   --   --  1.9  PHOS 3.1  --   --   --  4.7*   GFR: Estimated Creatinine Clearance: 34.6 mL/min (A) (by C-G formula based on SCr of 1.3 mg/dL (H)). Liver Function Tests: Recent Labs  Lab 10/24/21 0829 10/25/21 0410  AST 16 12*  ALT 13 9  ALKPHOS 46 49  BILITOT 0.4 0.6  PROT 5.7* 5.5*  ALBUMIN 2.9* 2.6*   No results for input(s): "LIPASE", "AMYLASE" in the last 168 hours. No results for input(s): "AMMONIA" in the last 168 hours. Coagulation Profile: Recent Labs  Lab 10/24/21 0829 10/25/21 0410  INR 1.5* 1.3*   Cardiac Enzymes: No results for input(s): "CKTOTAL", "CKMB", "CKMBINDEX", "TROPONINI" in the last 168 hours. BNP (last 3 results) No results for input(s): "PROBNP" in the last 8760 hours. HbA1C: No results for input(s): "HGBA1C" in the last 72 hours. CBG: No results for input(s): "GLUCAP" in the last 168 hours. Lipid Profile: No results for input(s): "CHOL", "HDL", "LDLCALC", "TRIG", "CHOLHDL", "LDLDIRECT" in the last 72  hours. Thyroid Function Tests: No results for input(s): "TSH", "T4TOTAL", "FREET4", "T3FREE", "THYROIDAB" in the last 72 hours. Anemia Panel: No results for input(s): "VITAMINB12", "FOLATE", "FERRITIN", "TIBC", "IRON", "RETICCTPCT" in the last 72 hours. Urine analysis:    Component Value Date/Time   COLORURINE YELLOW (A) 11/14/2020 2000   APPEARANCEUR HAZY (A) 11/14/2020 2000   LABSPEC 1.027 11/14/2020 2000   PHURINE 6.0 11/14/2020 2000   GLUCOSEU NEGATIVE 11/14/2020 2000   HGBUR NEGATIVE 11/14/2020 2000   BILIRUBINUR NEGATIVE 11/14/2020 2000   KETONESUR NEGATIVE 11/14/2020 2000  PROTEINUR NEGATIVE 11/14/2020 2000   NITRITE NEGATIVE 11/14/2020 2000   LEUKOCYTESUR MODERATE (A) 11/14/2020 2000   Sepsis Labs: @LABRCNTIP (procalcitonin:4,lacticidven:4)  No results found for this or any previous visit (from the past 240 hour(s)).       Radiology Studies last 96 hours: DG ABD ACUTE 2+V W 1V CHEST  Result Date: 10/27/2021 CLINICAL DATA:  Ileus following gastrointestinal surgery EXAM: DG ABDOMEN ACUTE WITH 1 VIEW CHEST COMPARISON:  CT done on 08/12/2021 FINDINGS: There is moderate dilation of small bowel loops measuring up to 4.5 cm in diameter. Stomach is not distended. There are metallic densities in the region of sigmoid, possibly contrast within diverticula. There is a large caliber catheter in abdomen with its tip in the pelvis, possibly a surgical drain. Skin staples are seen. There is previous left hip arthroplasty. Transverse diameter of the heart is increased. There is crowding of markings in the left parahilar region and both lower lung fields suggesting subsegmental atelectasis. There is slight improvement in the aeration of medial right lower lung field since 10/25/2021. Tip of right IJ chest port is seen in superior vena cava. IMPRESSION: There is mild-to-moderate dilation of small-bowel loops suggesting ileus. Linear densities in left mid and both lower lung fields suggest  subsegmental atelectasis. Electronically Signed   By: Elmer Picker M.D.   On: 10/27/2021 12:15   DG Chest Port 1 View  Result Date: 10/25/2021 CLINICAL DATA:  Cough, shortness of breath EXAM: PORTABLE CHEST 1 VIEW COMPARISON:  10/23/2021 FINDINGS: Right-sided Port-A-Cath in satisfactory position. Calcified right upper lobe pulmonary nodule likely reflecting sequela prior granulomatous disease. Right lower lobe hazy airspace disease concerning for atelectasis versus pneumonia. No pleural effusion or pneumothorax. Heart and mediastinal contours are unremarkable. No acute osseous abnormality. IMPRESSION: 1. Right lower lobe hazy airspace disease concerning for atelectasis versus pneumonia. Electronically Signed   By: Kathreen Devoid M.D.   On: 10/25/2021 11:42   ECHOCARDIOGRAM COMPLETE  Result Date: 10/25/2021    ECHOCARDIOGRAM REPORT   Patient Name:   EVALINA TABAK Date of Exam: 10/24/2021 Medical Rec #:  [id number]        Height:       64.0 in Accession #:    [id number]       Weight:       158.0 lb Date of Birth:  Nov 29, 1940        BSA:          1.770 m Patient Age:    78 years         BP:           108/69 mmHg Patient Gender: F                HR:           83 bpm. Exam Location:  [facility] Procedure: 2D Echo and Intracardiac Opacification Agent Indications:     Abnormal ECG R94.31                  Atrial Fibrillation I48.91  History:         Patient has no prior history of Echocardiogram examinations.  Sonographer:     Kathlen Brunswick RDCS Referring Phys:  [id number] DIEGO F PABON Diagnosing Phys: Kathlyn Sacramento MD  Sonographer Comments: Technically difficult study due to poor echo windows, no subcostal window and suboptimal apical window. Image acquisition challenging due to respiratory motion. IMPRESSIONS  1. Left ventricular ejection fraction, by estimation, is 55 to 60%. The left ventricle has normal  function. Left ventricular endocardial border not optimally defined to evaluate regional wall motion. Left  ventricular diastolic parameters are indeterminate.  2. Right ventricular systolic function is normal. The right ventricular size is normal. Tricuspid regurgitation signal is inadequate for assessing PA pressure.  3. The mitral valve is normal in structure. No evidence of mitral valve regurgitation. No evidence of mitral stenosis.  4. The aortic valve is normal in structure. Aortic valve regurgitation is not visualized. No aortic stenosis is present.  5. Technically difficult study due to poor echo windows, no subcostal window and suboptimal apical window. In addition, A-fib with RVR is noted. FINDINGS  Left Ventricle: Left ventricular ejection fraction, by estimation, is 55 to 60%. The left ventricle has normal function. Left ventricular endocardial border not optimally defined to evaluate regional wall motion. Definity contrast agent was given IV to delineate the left ventricular endocardial borders. The left ventricular internal cavity size was normal in size. There is no left ventricular hypertrophy. Left ventricular diastolic parameters are indeterminate. Right Ventricle: The right ventricular size is normal. No increase in right ventricular wall thickness. Right ventricular systolic function is normal. Tricuspid regurgitation signal is inadequate for assessing PA pressure. Left Atrium: Left atrial size was normal in size. Right Atrium: Right atrial size was normal in size. Pericardium: There is no evidence of pericardial effusion. Mitral Valve: The mitral valve is normal in structure. No evidence of mitral valve regurgitation. No evidence of mitral valve stenosis. Tricuspid Valve: The tricuspid valve is normal in structure. Tricuspid valve regurgitation is trivial. No evidence of tricuspid stenosis. Aortic Valve: The aortic valve is normal in structure. Aortic valve regurgitation is not visualized. No aortic stenosis is present. Aortic valve peak gradient measures 6.2 mmHg. Pulmonic Valve: The pulmonic valve  was normal in structure. Pulmonic valve regurgitation is not visualized. No evidence of pulmonic stenosis. Aorta: The aortic root is normal in size and structure. Venous: The inferior vena cava was not well visualized. IAS/Shunts: No atrial level shunt detected by color flow Doppler.  LEFT VENTRICLE PLAX 2D LVIDd:         4.23 cm     Diastology LVIDs:         2.93 cm     LV e' medial:    11.20 cm/s LV PW:         1.08 cm     LV E/e' medial:  6.7 LV IVS:        0.86 cm     LV e' lateral:   12.60 cm/s LVOT diam:     1.80 cm     LV E/e' lateral: 5.9 LV SV:         29 LV SV Index:   16 LVOT Area:     2.54 cm  LV Volumes (MOD) LV vol d, MOD A2C: 67.8 ml LV vol d, MOD A4C: 72.3 ml LV vol s, MOD A2C: 21.3 ml LV vol s, MOD A4C: 36.6 ml LV SV MOD A2C:     46.5 ml LV SV MOD A4C:     72.3 ml LV SV MOD BP:      41.6 ml RIGHT VENTRICLE RV Basal diam:  2.05 cm RV S prime:     12.00 cm/s TAPSE (M-mode): 1.5 cm LEFT ATRIUM             Index LA diam:        2.40 cm 1.36 cm/m LA Vol (A2C):   32.1 ml 18.14 ml/m LA Vol (A4C):  25.9 ml 14.63 ml/m LA Biplane Vol: 29.8 ml 16.84 ml/m  AORTIC VALVE                 PULMONIC VALVE AV Area (Vmax): 1.57 cm     PV Vmax:       0.87 m/s AV Vmax:        125.00 cm/s  PV Peak grad:  3.0 mmHg AV Peak Grad:   6.2 mmHg LVOT Vmax:      77.00 cm/s LVOT Vmean:     53.000 cm/s LVOT VTI:       0.113 m  AORTA Ao Root diam: 2.90 cm MITRAL VALVE               TRICUSPID VALVE MV Area (PHT): 5.75 cm    TV Peak grad:   18.0 mmHg MV Decel Time: 132 msec    TV Vmax:        2.12 m/s MV E velocity: 74.60 cm/s MV A velocity: 51.00 cm/s  SHUNTS MV E/A ratio:  1.46        Systemic VTI:  0.11 m                            Systemic Diam: 1.80 cm Kathlyn Sacramento MD Electronically signed by Kathlyn Sacramento MD Signature Date/Time: 10/25/2021/9:50:17 AM    Final             LOS: 7 days      Emeterio Reeve, DO [facility] 10/28/2021, 1:19 PM   Staff may message me via secure chat in [place]  but  this may not receive immediate response,  please page for urgent matters!  If 7PM-7AM, please contact night-coverage [email or web address]  Dictation software was used to generate the above note. Typos may occur and escape review, as with typed/written notes. Please contact Dr Sheppard Coil directly for clarity if needed.

## 2021-10-28 NOTE — Progress Notes (Signed)
Physical Therapy Treatment Patient Details Name: Jenna [name] MRN: [id number] DOB: [birth year] Today's Date: 10/28/2021   History of Present Illness Shanah is a 81 year old female with known history of adenocarcinoma of the descending colon causing large bowel obstruction requiring emergent laparotomy and subtotal colectomy with end ileostomy by Dr. Celine Ahr on August 2022, she did Perforate.  She had 29 nodes that were negative for metastatic disease.  She has been followed by oncology and recent PET/CT is consistent with metastatic disease to the liver.  There is also lung lesion c/w metastasis. Patient is S/P ileostomy take down, hernia repair and abdominal wall reconstruction 10/14/2021. Patient now with ileus 10/27/21.    PT Comments    Patient received in bed, OT in room for Eval. Daughter at bedside. She is agreeable to PT session, reports weakness, having some belching and some mild nausea. RN aware per daughter. Patient requires min A for bed mobility and transfers. She is limited by dizziness and weakness this session. BP low today. She is able to take a few steps at edge of bed with RW and min A. Patient encouraged to perform small bouts of mobility throughout the day with nursing staff as able. ( Up to recliner or BSC). She will continue to benefit from skilled PT to improve strength and functional independence.         Recommendations for follow up therapy are one component of a multi-disciplinary discharge planning process, led by the attending physician.  Recommendations may be updated based on patient status, additional functional criteria and insurance authorization.  Follow Up Recommendations  Acute inpatient rehab (3hours/day)     Assistance Recommended at Discharge Frequent or constant Supervision/Assistance  Patient can return home with the following A little help with bathing/dressing/bathroom;Help with stairs or ramp for entrance;Assist for transportation;Assistance with  cooking/housework;A lot of help with walking and/or transfers   Equipment Recommendations  None recommended by PT    Recommendations for Other Services       Precautions / Restrictions Precautions Precautions: Fall Restrictions Weight Bearing Restrictions: No Other Position/Activity Restrictions: Patient does not have to have abdominal binder for mobility, but may benefit and help with pain due to abdominal incisions, low BP.     Mobility  Bed Mobility Overal bed mobility: Needs Assistance Bed Mobility: Supine to Sit, Sit to Supine     Supine to sit: Min assist, HOB elevated Sit to supine: Min assist   General bed mobility comments: Patient now has abdominal binder. Doesn't fit real well but applied it in sitting. Not imperitive that she has it, but may assist with pain/support and BP    Transfers Overall transfer level: Needs assistance Equipment used: Rolling walker (2 wheels) Transfers: Sit to/from Stand Sit to Stand: Min assist           General transfer comment: increased time needed to get standing. Able to stand briefly x 3    Ambulation/Gait Ambulation/Gait assistance: Min assist Gait Distance (Feet): 3 Feet Assistive device: Rolling walker (2 wheels) Gait Pattern/deviations: Step-to pattern       General Gait Details: patient able to take a few side steps along edge of bed with min A. Fatigued with minimal acitivty this session. Had increased dizziness this session limiting ability.   Stairs             Wheelchair Mobility    Modified Rankin (Stroke Patients Only)       Balance Overall balance assessment: Needs assistance Sitting-balance support: Feet supported Sitting  balance-Leahy Scale: Good     Standing balance support: Bilateral upper extremity supported, During functional activity, Reliant on assistive device for balance Standing balance-Leahy Scale: Fair Standing balance comment: she is able to stand briefly without UE  support. Fatigues easily in standing                            Cognition Arousal/Alertness: Awake/alert Behavior During Therapy: WFL for tasks assessed/performed Overall Cognitive Status: Within Functional Limits for tasks assessed                                 General Comments: Patient reports she is weak        Exercises      General Comments        Pertinent Vitals/Pain Pain Assessment Pain Assessment: Faces Faces Pain Scale: Hurts little more Pain Location: no pain at rest, min abdominal pain with movement Pain Descriptors / Indicators: Discomfort, Sore Pain Intervention(s): Monitored during session, Repositioned    Home Living   Living Arrangements: Spouse/significant other Available Help at Discharge: Family;Available PRN/intermittently (available 24/7 until mid-August) Type of Home: House Home Access: Ramped entrance       Home Layout: Able to live on main level with bedroom/bathroom        Prior Function            PT Goals (current goals can now be found in the care plan section) Acute Rehab PT Goals Patient Stated Goal: to get some additional rehab PT Goal Formulation: With patient/family Time For Goal Achievement: 11/07/21 Potential to Achieve Goals: Good Progress towards PT goals: Not progressing toward goals - comment (weakness today and low BP leading to dizziness in sitting and standing)    Frequency    Min 2X/week      PT Plan Current plan remains appropriate    Co-evaluation              AM-PAC PT "6 Clicks" Mobility   Outcome Measure  Help needed turning from your back to your side while in a flat bed without using bedrails?: A Little Help needed moving from lying on your back to sitting on the side of a flat bed without using bedrails?: A Little Help needed moving to and from a bed to a chair (including a wheelchair)?: A Little Help needed standing up from a chair using your arms (e.g.,  wheelchair or bedside chair)?: A Little Help needed to walk in hospital room?: Total Help needed climbing 3-5 steps with a railing? : Total 6 Click Score: 14    End of Session Equipment Utilized During Treatment: Gait belt;Other (comment) (abdominal binder) Activity Tolerance: Patient limited by fatigue Patient left: in bed;with call bell/phone within reach;with bed alarm set;with family/visitor present Nurse Communication: Mobility status PT Visit Diagnosis: Other abnormalities of gait and mobility (R26.89);Muscle weakness (generalized) (M62.81);Difficulty in walking, not elsewhere classified (R26.2);Pain Pain - part of body:  (abdomen)     Time: 2094-7096 PT Time Calculation (min) (ACUTE ONLY): 35 min  Charges:  $Gait Training: 8-22 mins                     Evonna Stoltz, PT, GCS 10/28/21,2:24 PM

## 2021-10-28 NOTE — Care Management Important Message (Signed)
Important Message  Patient Details  Name: Jenna [name] MRN: [id number] Date of Birth: [birth year]   Medicare Important Message Given:  Yes     Dannette Barbara 10/28/2021, 11:02 AM

## 2021-10-28 NOTE — Consult Note (Signed)
   [facility] Inpatient Consult   10/28/2021  Jenna [name] 01-Aug-1940 [id number]  [place] [facility] [ACO] Patient: [employer]  Primary Care Provider:  [facility] is a [facility] provider  *Remote review coverage for [facility]  Patient screened for length of stay hospitalization with noted extreme high risk score for unplanned readmission risk and  to assess for potential [place] Management service needs for post hospital transition.  Review of patient's medical record reveals patient is currently being recommended for an inpatient rehabilitation level of care for post hospital needs per PT/OT evaluation. Also, reviewed inpatient [facility] RNCM notes for needs.  Currently, insurance approval CIR needs.  Plan:  Continue to follow progress and disposition to assess for post hospital care management needs.  Pending disposition for post hospital follow up needs will be followed.  For questions contact:   Natividad Brood, RN BSN [facility] Liaison  [phone] business mobile phone Toll free office [phone]  Fax number: [phone] [email or web address]

## 2021-10-28 NOTE — Progress Notes (Addendum)
RN alerted me that patient was describing chest/neck pain after working with PT.  I came to bedside.  Patient was describing soreness/tenderness, pointing to base/lateral side of left neck.  Patient reports some tenderness to palpation, on resisted shoulder shrugging she notes this exacerbates it.  Thought to be musculoskeletal in nature due to working with physical therapy, likely muscle strain.  She states that warm compress is helping and I encourage continuation of this.  No substernal chest pain, no new SOB, no diaphoresis.   RN alerted me a bit later that patient had experienced an episode of vomiting which she described as "felt like acid reflux", daughter earlier had reported that patient had been feeling a little bit bloated all day and nauseous.  I ordered KUB, alerted Dr Dahlia Byes - plan to place NG tube.

## 2021-10-28 NOTE — Evaluation (Signed)
Occupational Therapy Evaluation Patient Details Name: Jenna [name] MRN: [id number] DOB: Dec 20, 1940 Today's Date: 10/28/2021   History of Present Illness Jenna [name] is a 81 year old female with known history of adenocarcinoma of the descending colon causing large bowel obstruction requiring emergent laparotomy and subtotal colectomy with end ileostomy by Dr. Celine Ahr on August 2022, she did Perforate.  She had 29 nodes that were negative for metastatic disease.  She has been followed by oncology and recent PET/CT is consistent with metastatic disease to the liver.  There is also lung lesion c/w metastasis. Patient is S/P ileostomy take down, hernia repair and abdominal wall reconstruction 10/25/2021. Patient now with ileus 10/27/21.   Clinical Impression   Chart reviewed, RN cleared pt for participation in OT evaluation. Co tx completed with PT on this date. Pt is agreeable and motivated for further mobility, however limited by BP on this date. Pt + abdominal binder. PTA pt was mod I-I in all ADL, amb with RW. Pt requires MIN A for supine>sit, MIN A for STS. MAX A required for LB dressing, MOD A required for UB dressing, SET UP required for grooming tasks seated edge of bed. Pt and daughter educated re: importance of continuing mobility (even if therapy is not present, I.e. sitting on edge of bed, transfer to bedside chair for meals). At this time, pt presents with deficits in strength, endurance, activity tolerance all affecting safe and optimal ADL completion. Pt would benefit from intensive therapy to address functional deficits and facilitate return to PLOF. Pt is left as received, NAD, all needs met. OT will follow acutely.      Recommendations for follow up therapy are one component of a multi-disciplinary discharge planning process, led by the attending physician.  Recommendations may be updated based on patient status, additional functional criteria and insurance authorization.   Follow Up  Recommendations  Acute inpatient rehab (3hours/day)    Assistance Recommended at Discharge Frequent or constant Supervision/Assistance  Patient can return home with the following A lot of help with walking and/or transfers    Functional Status Assessment  Patient has had a recent decline in their functional status and demonstrates the ability to make significant improvements in function in a reasonable and predictable amount of time.  Equipment Recommendations  Other (comment) (per next venue of care)    Recommendations for Other Services       Precautions / Restrictions Precautions Precautions: Fall Restrictions Weight Bearing Restrictions: No Other Position/Activity Restrictions: Patient does not have to have abdominal binder for mobility, but may benefit and help with pain due to abdominal incisions, low BP.      Mobility Bed Mobility Overal bed mobility: Needs Assistance Bed Mobility: Supine to Sit, Sit to Supine     Supine to sit: Min assist, HOB elevated Sit to supine: Min assist        Transfers Overall transfer level: Needs assistance Equipment used: Rolling walker (2 wheels) Transfers: Sit to/from Stand Sit to Stand: Min assist                  Balance Overall balance assessment: Needs assistance Sitting-balance support: Feet supported Sitting balance-Leahy Scale: Good     Standing balance support: Bilateral upper extremity supported, During functional activity, Reliant on assistive device for balance Standing balance-Leahy Scale: Fair                             ADL either performed or assessed with  clinical judgement   ADL Overall ADL's : Needs assistance/impaired     Grooming: Wash/dry face;Sitting;Set up               Lower Body Dressing: Maximal assistance Lower Body Dressing Details (indicate cue type and reason): socks due to abdominal incision- pt and daugther educated on future use of AE PRN                      Vision Patient Visual Report: No change from baseline       Perception     Praxis      Pertinent Vitals/Pain Pain Assessment Pain Assessment: Faces Faces Pain Scale: Hurts little more Pain Location: abdominal pain with movement Pain Descriptors / Indicators: Discomfort, Sore Pain Intervention(s): Limited activity within patient's tolerance, Repositioned, Premedicated before session     Hand Dominance     Extremity/Trunk Assessment Upper Extremity Assessment Upper Extremity Assessment: Generalized weakness   Lower Extremity Assessment Lower Extremity Assessment: Generalized weakness   Cervical / Trunk Assessment Cervical / Trunk Assessment: Normal   Communication Communication Communication: No difficulties   Cognition Arousal/Alertness: Awake/alert Behavior During Therapy: WFL for tasks assessed/performed Overall Cognitive Status: Within Functional Limits for tasks assessed                                       General Comments  BP 107/65 (MAP 79) HR 74 seated at edge of bed; BP 87/76 (MAP 81) HR 124 after standing immediately; RN notified    Exercises Other Exercises Other Exercises: edu pt and daugther re: role of OT, role of rehab, discharge recommendations, importance of oob mobility, how to place bed into chair mode   Shoulder Instructions      Home Living Family/patient expects to be discharged to:: Private residence Living Arrangements: Spouse/significant other Available Help at Discharge: Family;Available 24 hours/day Type of Home: House Home Access: Ramped entrance Entrance Stairs-Number of Steps: 2 Entrance Stairs-Rails: Right Home Layout: Able to live on main level with bedroom/bathroom     Bathroom Shower/Tub: Occupational psychologist: Standard Bathroom Accessibility: Yes How Accessible: Accessible via walker Home Equipment: Conservation officer, nature (2 wheels)      Lives With: Spouse    Prior Functioning/Environment                Mobility Comments: amb with RW PTA ADLs Comments: MOD I-I in ADL, assist with IADL as needed        OT Problem List: Decreased strength;Decreased knowledge of use of DME or AE;Decreased knowledge of precautions;Decreased activity tolerance;Impaired balance (sitting and/or standing)      OT Treatment/Interventions: Self-care/ADL training;Patient/family education;Therapeutic exercise;Balance training;Energy conservation;Therapeutic activities    OT Goals(Current goals can be found in the care plan section) Acute Rehab OT Goals Patient Stated Goal: get stronger OT Goal Formulation: With patient/family Time For Goal Achievement: 11/11/21 Potential to Achieve Goals: Good ADL Goals Pt Will Perform Grooming: with modified independence;sitting;standing Pt Will Perform Lower Body Dressing: sit to/from stand;with modified independence Pt Will Transfer to Toilet: with modified independence;ambulating Pt Will Perform Toileting - Clothing Manipulation and hygiene: with modified independence;sit to/from stand  OT Frequency: Min 4X/week    Co-evaluation              AM-PAC OT "6 Clicks" Daily Activity     Outcome Measure Help from another person eating meals?: None Help from  another person taking care of personal grooming?: A Little Help from another person toileting, which includes using toliet, bedpan, or urinal?: A Lot Help from another person bathing (including washing, rinsing, drying)?: A Lot Help from another person to put on and taking off regular upper body clothing?: A Little Help from another person to put on and taking off regular lower body clothing?: A Lot 6 Click Score: 16   End of Session Equipment Utilized During Treatment: Gait belt;Rolling walker (2 wheels) Nurse Communication: Mobility status;Other (comment) (vital signs, order ted hose)  Activity Tolerance: Treatment limited secondary to medical complications (Comment) (low BP) Patient left: in  bed;with call bell/phone within reach;with family/visitor present  OT Visit Diagnosis: Unsteadiness on feet (R26.81);Muscle weakness (generalized) (M62.81)                Time: 2446-2863 OT Time Calculation (min): 37 min Charges:  OT General Charges $OT Visit: 1 Visit OT Evaluation $OT Eval Moderate Complexity: 1 Mod  Shanon Payor, OTD OTR/L  10/28/21, 3:29 PM

## 2021-10-29 ENCOUNTER — Inpatient Hospital Stay: Payer: Medicare HMO

## 2021-10-29 ENCOUNTER — Inpatient Hospital Stay: Payer: Self-pay

## 2021-10-29 ENCOUNTER — Telehealth: Payer: Self-pay

## 2021-10-29 DIAGNOSIS — Z9889 Other specified postprocedural states: Secondary | ICD-10-CM | POA: Diagnosis not present

## 2021-10-29 LAB — CBC
HCT: 23.8 % — ABNORMAL LOW (ref 36.0–46.0)
Hemoglobin: 7.6 g/dL — ABNORMAL LOW (ref 12.0–15.0)
MCH: 30.8 pg (ref 26.0–34.0)
MCHC: 31.9 g/dL (ref 30.0–36.0)
MCV: 96.4 fL (ref 80.0–100.0)
Platelets: 186 10*3/uL (ref 150–400)
RBC: 2.47 MIL/uL — ABNORMAL LOW (ref 3.87–5.11)
RDW: 16.4 % — ABNORMAL HIGH (ref 11.5–15.5)
WBC: 12.6 10*3/uL — ABNORMAL HIGH (ref 4.0–10.5)
nRBC: 0 % (ref 0.0–0.2)

## 2021-10-29 LAB — COMPREHENSIVE METABOLIC PANEL
ALT: 9 U/L (ref 0–44)
AST: 14 U/L — ABNORMAL LOW (ref 15–41)
Albumin: 2.1 g/dL — ABNORMAL LOW (ref 3.5–5.0)
Alkaline Phosphatase: 62 U/L (ref 38–126)
Anion gap: 13 (ref 5–15)
BUN: 32 mg/dL — ABNORMAL HIGH (ref 8–23)
CO2: 15 mmol/L — ABNORMAL LOW (ref 22–32)
Calcium: 8.7 mg/dL — ABNORMAL LOW (ref 8.9–10.3)
Chloride: 107 mmol/L (ref 98–111)
Creatinine, Ser: 1.36 mg/dL — ABNORMAL HIGH (ref 0.44–1.00)
GFR, Estimated: 40 mL/min — ABNORMAL LOW (ref >60)
Glucose, Bld: 107 mg/dL — ABNORMAL HIGH (ref 70–99)
Potassium: 4.1 mmol/L (ref 3.5–5.1)
Sodium: 135 mmol/L (ref 135–145)
Total Bilirubin: 1.2 mg/dL (ref 0.3–1.2)
Total Protein: 5.2 g/dL — ABNORMAL LOW (ref 6.5–8.1)

## 2021-10-29 LAB — GLUCOSE, CAPILLARY
Glucose-Capillary: 101 mg/dL — ABNORMAL HIGH (ref 70–99)
Glucose-Capillary: 102 mg/dL — ABNORMAL HIGH (ref 70–99)
Glucose-Capillary: 104 mg/dL — ABNORMAL HIGH (ref 70–99)
Glucose-Capillary: 129 mg/dL — ABNORMAL HIGH (ref 70–99)
Glucose-Capillary: 129 mg/dL — ABNORMAL HIGH (ref 70–99)
Glucose-Capillary: 97 mg/dL (ref 70–99)

## 2021-10-29 LAB — PHOSPHORUS: Phosphorus: 5.6 mg/dL — ABNORMAL HIGH (ref 2.5–4.6)

## 2021-10-29 LAB — PROTIME-INR
INR: 1.2 (ref 0.8–1.2)
Prothrombin Time: 15.2 seconds (ref 11.4–15.2)

## 2021-10-29 LAB — MAGNESIUM: Magnesium: 2 mg/dL (ref 1.7–2.4)

## 2021-10-29 LAB — HEPARIN ANTI-XA: Heparin LMW: 0.3 IU/mL

## 2021-10-29 MED ORDER — IOHEXOL 300 MG/ML  SOLN
100.0000 mL | Freq: Once | INTRAMUSCULAR | Status: AC | PRN
  Administered 2021-10-29: 100 mL via INTRAVENOUS

## 2021-10-29 MED ORDER — SODIUM CHLORIDE 0.9% FLUSH: 10.0000 mL | INTRAVENOUS | Status: DC | PRN

## 2021-10-29 MED ORDER — PIPERACILLIN-TAZOBACTAM 3.375 G IVPB
3.3750 g | Freq: Two times a day (BID) | INTRAVENOUS | Status: DC
  Administered 2021-10-29 – 2021-10-30 (×4): 3.375 g via INTRAVENOUS
  Filled 2021-10-29 (×4): qty 50

## 2021-10-29 MED ORDER — IOHEXOL 300 MG/ML  SOLN: 80.0000 mL | Freq: Once | INTRAMUSCULAR | Status: DC | PRN

## 2021-10-29 MED ORDER — TRAVASOL 10 % IV SOLN
INTRAVENOUS | Status: AC
  Filled 2021-10-29: qty 515

## 2021-10-29 MED ORDER — ALBUMIN HUMAN 25 % IV SOLN
25.0000 g | Freq: Once | INTRAVENOUS | Status: AC
  Administered 2021-10-29: 25 g via INTRAVENOUS
  Filled 2021-10-29: qty 100

## 2021-10-29 MED ORDER — SODIUM CHLORIDE 0.9 % IV SOLN
500.0000 mg | INTRAVENOUS | Status: DC
  Administered 2021-10-29 – 2021-10-31 (×3): 500 mg via INTRAVENOUS
  Filled 2021-10-29: qty 500
  Filled 2021-10-29: qty 5
  Filled 2021-10-29: qty 500
  Filled 2021-10-29: qty 5

## 2021-10-29 MED ORDER — INSULIN ASPART 100 UNIT/ML IJ SOLN
0.0000 [IU] | INTRAMUSCULAR | Status: DC
  Administered 2021-10-30 – 2021-10-31 (×6): 1 [IU] via SUBCUTANEOUS
  Administered 2021-10-31: 2 [IU] via SUBCUTANEOUS
  Administered 2021-10-31 – 2021-11-01 (×5): 1 [IU] via SUBCUTANEOUS
  Filled 2021-10-29 (×10): qty 1

## 2021-10-29 MED ORDER — SODIUM CHLORIDE 0.9 % IV BOLUS
500.0000 mL | Freq: Once | INTRAVENOUS | Status: AC
  Administered 2021-10-29: 500 mL via INTRAVENOUS

## 2021-10-29 MED ORDER — METOPROLOL TARTRATE 5 MG/5ML IV SOLN
2.5000 mg | Freq: Three times a day (TID) | INTRAVENOUS | Status: DC
  Administered 2021-10-29: 2.5 mg via INTRAVENOUS
  Filled 2021-10-29: qty 5

## 2021-10-29 MED ORDER — IOHEXOL 9 MG/ML PO SOLN: 500.0000 mL | ORAL | Status: DC

## 2021-10-29 MED ORDER — SODIUM CHLORIDE 0.9% FLUSH
10.0000 mL | Freq: Two times a day (BID) | INTRAVENOUS | Status: DC
  Administered 2021-10-29: 20 mL
  Administered 2021-10-29 – 2021-11-01 (×5): 10 mL

## 2021-10-29 MED ORDER — ENOXAPARIN SODIUM 100 MG/ML IJ SOSY
1.2500 mg/kg | PREFILLED_SYRINGE | Freq: Two times a day (BID) | INTRAMUSCULAR | Status: DC
Start: 2021-10-29 — End: 2021-11-01
  Administered 2021-10-29 – 2021-11-01 (×6): 90 mg via SUBCUTANEOUS
  Filled 2021-10-29 (×7): qty 0.9

## 2021-10-29 MED ORDER — LACTATED RINGERS IV SOLN: INTRAVENOUS | Status: AC

## 2021-10-29 MED ORDER — METOPROLOL TARTRATE 5 MG/5ML IV SOLN
5.0000 mg | Freq: Four times a day (QID) | INTRAVENOUS | Status: DC | PRN
Start: 2021-10-29 — End: 2021-11-02
  Administered 2021-10-29 – 2021-10-30 (×2): 5 mg via INTRAVENOUS
  Filled 2021-10-29 (×2): qty 5

## 2021-10-29 NOTE — Consult Note (Signed)
PHARMACY - TOTAL PARENTERAL NUTRITION CONSULT NOTE   Indication: Prolonged ileus  Patient Measurements: Height: 5\' 4"  (162.6 cm) Weight: 71.7 kg (158 lb) IBW/kg (Calculated) : 54.7 TPN AdjBW (KG): 58.9 Body mass index is 27.12 kg/m. Usual Weight: 71.7kg  Assessment:  [name] w/ h/o colon cancer (adenocarcinoma with large bowel obstruction requiring emergent laparotomy and subtotal colectomy in August 2022) on 7/28 now POD7 from an ileostomy takedown complicated by a post-op illeus and N/V 7/27 midday. Pharmacy consulted for TPN and electrolyte mgmt.  Glucose / Insulin: (goal gluc <180; current 100-110s) Electrolytes: OTF; No new labs collected yet this AM. K: 3.4>3.5>__ Mg: 1.9>__ Phos: 4.7>__ Renal: Scr 1.2>1.3  (baseline<1) Hepatic: LFTs WNL; Albumin 2.6 Intake / Output; MIVF:  on LR @100ml /h GI Imaging: 7/26 CT Abd - Mild-to-moderate dilation of small-bowel loops suggesting ileus. 7/26 KUB - Mild gaseous distention of small bowel similar to prior study, likely ileus in the postoperative setting. Recent postoperative changes in the abdomen and pelvis. 7/27  CT abd/pelvis w/ Con - pending result  GI Surgeries / Procedures:  August 2022 - large bowel obstruction requiring emergent laparotomy and subtotal colectomy October 21 2021 - Ileostomy take down.  Central access:  TPN start date: 10/29/2021  Nutritional Goals: Goal TPN rate is 75 mL/hr (provides 104 g of protein and 1875 kcals per day)  RD Assessment: Estimated Needs Total Energy Estimated Needs: 1700-1900 Total Protein Estimated Needs: 95-110 grams Total Fluid Estimated Needs: > 1.7 L  Current Nutrition:  TPN  Plan:  Start TPN at half rate 76mL/hr at 1800 on first evening. Plan to increase to goal rate tomorrow. 1st day rate will supply: 774 kcals; 51.5g protein, ~968ml fluid  NOTE: Dex was reduced 7/28 from 15>10% to compensate for overfill with volume of bag. Please increase back to 15% 7/29 once at  goal.  Electrolytes in TPN: Na 68mEq/L, K 22mEq/L, Ca 80mEq/L, Mg 25mEq/L, and Phos 31mmol/L. Cl:Ac 1:1 Add standard MVI and trace elements to TPN Will add Thiamine 100mg  (d1/3d) Initiate Sensitive q4h SSI and adjust as needed  Reduce MIVF to 50 mL/hr at 1800 Monitor TPN labs on Mon/Thurs, Will get additional set of labs in AM to assess new start.  Shanon Brow Grenda Lora 10/29/2021,9:41 AM

## 2021-10-29 NOTE — Progress Notes (Signed)
Peripherally Inserted Central Catheter Placement  The IV Nurse has discussed with the patient and/or persons authorized to consent for the patient, the purpose of this procedure and the potential benefits and risks involved with this procedure.  The benefits include less needle sticks, lab draws from the catheter, and the patient may be discharged home with the catheter. Risks include, but not limited to, infection, bleeding, blood clot (thrombus formation), and puncture of an artery; nerve damage and irregular heartbeat and possibility to perform a PICC exchange if needed/ordered by physician.  Alternatives to this procedure were also discussed.  Bard Power PICC patient education guide, fact sheet on infection prevention and patient information card has been provided to patient /or left at bedside.    PICC Placement Documentation  PICC Double Lumen 26/83/41 Right Basilic 37 cm 0 cm (Active)  Indication for Insertion or Continuance of Line Prolonged intravenous therapies 10/29/21 1200  Exposed Catheter (cm) 0 cm 10/29/21 1200  Site Assessment Clean, Dry, Intact 10/29/21 1200  Lumen #1 Status Flushed;Blood return noted;Saline locked 10/29/21 1200  Lumen #2 Status Flushed;Blood return noted;Saline locked 10/29/21 1200  Dressing Type Transparent;Securing device 10/29/21 1200  Dressing Status Clean, Dry, Intact;Antimicrobial disc in place 10/29/21 1200  Dressing Change Due 11/05/21 10/29/21 1200       Jule Economy Horton 10/29/2021, 12:47 PM

## 2021-10-29 NOTE — Progress Notes (Signed)
PT Cancellation Note  Patient Details Name: [name] MRN: [id number] DOB: 04-21-1940   Cancelled Treatment:    Reason Eval/Treat Not Completed: Other (comment)  Pt stated she was having a rough day today.  Drinking contrast for scan later.  Will return later this afternoon and continue as appropriate.   Chesley Noon 10/29/2021, 11:37 AM

## 2021-10-29 NOTE — Plan of Care (Signed)
  Problem: Clinical Measurements: Goal: Ability to maintain clinical measurements within normal limits will improve Outcome: Progressing   Problem: Clinical Measurements: Goal: Will remain free from infection Outcome: Progressing   Problem: Clinical Measurements: Goal: Diagnostic test results will improve Outcome: Progressing   

## 2021-10-29 NOTE — Consult Note (Addendum)
ANTICOAGULATION CONSULT NOTE - Initial Consult  Pharmacy Consult for Enoxaparin Indication: atrial fibrillation  No Known Allergies  Patient Measurements: Height: _0  (162.6 cm) Weight: 71.7 kg (158 lb) IBW/kg (Calculated) : 54.7  Vital Signs: Temp: 97.8 F (36.6 C) (07/28 0350) Temp Source: Oral (07/28 0350) BP: 105/60 (07/28 0642) Pulse Rate: 102 (07/28 0642)  Labs: Recent Labs    10/27/21 1110 10/28/21 0525  HGB 8.2* 8.0*  HCT 25.4* 24.6*  PLT 164 171  CREATININE 1.20* 1.30*     Estimated Creatinine Clearance: 34.6 mL/min (A) (by C-G formula based on SCr of 1.3 mg/dL (H)).   Medical History: Past Medical History:  Diagnosis Date   BRCA negative 03/2020   MyRisk neg except MSH3 VUS   Colon cancer [facility])    Family history of breast cancer    IBIS=4.4%/riskscore=2.9%   Gout    Hyperlipidemia    Hypertension    Scarlet fever    Stroke [facility]) 05/2019   Thyroid disease     Assessment: Patient status post ileostomy. Medical history includes Afib,and  AKI. Pharmacy consulted to dose enoxaparin for A.fib. Chads-Vasc score of 6 with hx os stroke.   Hgb 11.8-->9.3>8.6>8.4 PLT 189-->118-->146>153  Plan:  LMWH level ~4hrs post dose was 0.3; Based on this will need increase of ~25%.  Pt will likely remain on LMWH since starting TPN and NPO w/ N/V. AKI slow to improve - [Scr 1.97-->1.54>1.27>1.3>1.36 (baseline<1)] Enoxaparin 9m/kg every 12 hours > Enoxaparin 1.265mkg q12h .  Plan per cardiology is for DOAC after LMWH.  BrLorna DibblePharmD, [facility] Inclinical Pharmacist 10/29/2021 8:10 AM

## 2021-10-29 NOTE — Progress Notes (Signed)
Nutrition Follow-up  DOCUMENTATION CODES:   Not applicable  INTERVENTION:   -D/c Ensure  -TPN management per pharmacy -RD will follow for diet advancement and add supplements as appropriate  NUTRITION DIAGNOSIS:   Increased nutrient needs related to post-op healing as evidenced by estimated needs.  Ongoing  GOAL:   Patient will meet greater than or equal to 90% of their needs  Progressing  MONITOR:   PO intake, Supplement acceptance  REASON FOR ASSESSMENT:   Malnutrition Screening Tool    ASSESSMENT:   Pt with past medical history of colon cancer (adenocarcinoma with large bowel obstruction requiring emergent laparotomy and subtotal colectomy in August 2022), hypothyroidism and hyperlipidemia admitted on 7/20 by general surgery for colostomy reversal and abdominal wall reconstruction who developed new onset paroxysmal atrial fibrillation  7/20- s/p Ileostomy take down with stapled Ileoproctostomy EEA Baker Anastomosis; Abdominal wall reconstruction with bilateral myocutaneous flaps using a TAR release and  two Psahix Meshes  in  a home plate and diamond configuration polypropylene mesh measuring 30 x 25 cm and 20x 25cms; Incisional hernia repair greater 12 cms; Wound vac Placement 34 x 2 cm ( Prevena plus) 7/27- NGT placed, NPO  Reviewed I/O's: +2.1 L x 24 hours and +8.3 L since admission  UOP: 400 ml x 24 hours  NGT: 1000 ml x 24 hours  Drain output: 410 ml x 24 hours  Per MD notes, pt complained of bloating, nausea, and vomiting yesterday and another KUB was ordered (revealed some mild dilation in small bowel). NGT placed and pt now NPO  Case discussed with pharmacy; plan to start TPN today. TPN to initiate @ 37 ml/hr, which will provide 774 kcals and 52 grams protein, meeting 45% of estimated kcals and 55% of estimated protein needs.   Medications reviewed and include lactated ringers infusion @ 100 ml/hr.   Labs reviewed: CBGS: 97 (inpatient orders for  glycemic control are 0-6 units insulin aspart every 4 hours).    Diet Order:   Diet Order             Diet NPO time specified Except for: Ice Chips  Diet effective now                   EDUCATION NEEDS:   Education needs have been addressed  Skin:  Skin Assessment: Skin Integrity Issues: Skin Integrity Issues:: Wound VAC Wound Vac: abdomen  Last BM:  10/28/21 (type 7)  Height:   Ht Readings from Last 1 Encounters:  10/10/2021 5\' 4"  (1.626 m)    Weight:   Wt Readings from Last 1 Encounters:  10/11/2021 71.7 kg    Ideal Body Weight:  54.5 kg  BMI:  Body mass index is 27.12 kg/m.  Estimated Nutritional Needs:   Kcal:  1700-1900  Protein:  95-110 grams  Fluid:  > 1.7 L    Loistine Chance, RD, LDN, [place] Registered Dietitian II Certified Diabetes Care and Education Specialist Please refer to [facility] for RD and/or RD on-call/weekend/after hours pager

## 2021-10-29 NOTE — Progress Notes (Signed)
PT Cancellation Note  Patient Details Name: Jenna [name] MRN: [id number] DOB: 1940/11/19   Cancelled Treatment:    Reason Eval/Treat Not Completed: Patient at procedure or test/unavailable  Pt remains at imaging this pm.  Discussed with daughter who agreed with waiting until tomorrow to attempt session.   Chesley Noon 10/29/2021, 2:39 PM

## 2021-10-29 NOTE — Progress Notes (Signed)
POD # 8 Had some nausea and vomiting yesterday requiring NG KUB some mild dilation SB no evidence of free air, pers reviewed Still with soft blood pressure She denies any abdominal pain whatsoever Creat stable Productive cough Labs showed bump in wbc   PE NAD Abd: soft, incision clean and dry, no infection, no peritonitis. Drain w serous output   A/P  Postoperative ileus and pneumonia. Given these new change even though clinically she is not behaving as anastomotic leak I will definitely would like to proceed further work-up in the form of a CT scan of the abdomen pelvis.  I do understand that her creatinine is a little bit elevated but the benefit of obtaining contrast with IV and p.o. contrast for the outweighed the risk of more kidney injury. I had an extensive discussion with the daughter regarding the situation. She is frail. Actually had a conversation with RN about giving the contrast via the NG.  I actually did it myself and talk to the nurse. Her abdomen is not peritonitic We will continue to watch her closely

## 2021-10-29 NOTE — Progress Notes (Signed)
OT Cancellation Note  Patient Details Name: Jenna [name] MRN: [id number] DOB: [birth year]   Cancelled Treatment:    Reason Eval/Treat Not Completed: Other (comment) (pt declined, recently back from imaging, reporting fatigue. Spo2 90% on RA. Educated pt to attempt chair mode using bed features later tonight. OT will reattempt as able.Shanon Payor, OTD OTR/L  10/29/21, 3:11 PM

## 2021-10-29 NOTE — Progress Notes (Addendum)
PROGRESS NOTE    Jenna [name]  GNF:[id number] DOB: 1940/12/01  DOA: 10/04/2021 Date of Service: 10/29/21 [facility]CP: [facility]     Brief Narrative / Hospital Course:   81 year old female with past medical history of colon cancer (adenocarcinoma with large bowel obstruction requiring emergent laparotomy and subtotal colectomy in August 2022), hypothyroidism and hyperlipidemia admitted on 7/20 by general surgery for colostomy reversal and abdominal wall reconstruction who developed new onset paroxysmal atrial fibrillation on 7/22.  Recent PET scan discovered metastases to liver and lung.   CARDIAC: Hospitalists were consulted for management of atrial fibrillation.  Patient started on IV amiodarone drip. Transitioned off and rate has been controlled. Cardiology consulted given hypotensive. Started low dose metoprolol. BP remained low, pt asymptomatic but not ambulating much. Have had to hold metoprolol at tiems d/t hypotension. 07/26: switching metoprolol to 12.5 mg bid and transition from lovenox to eliquis 5 mg bid at discretion of surgeon and hospitalist - cardiology has signed off. 07/27: BP low but stable, was hypotensive overnight which responded to IV fluids.  RESPIRATORY / ID: Repeated CXR 07/24 to eval PNA given coughing: RLL findings appear progressed compared to previous CXR, initiated CAP treatment. Pt has been stable on room air, cough improving, usine her incentive spirometer.  SURGICAL / GI: Concern for ileus 07/26 but no evidence of serious complications. 07/27: no BM x4 days, (+)nausea mid-day progressed to vomiting. Placed NG tube for concern for obstruction, gen surg (Dr Dahlia Byes) following, NG helped bloating and symptoms. TPN and PICC planned 07/28. CT pending 07/28.   Progression goals: resolution/improvement of postoperative ileus/obstruction, pending placement hoping to get inpatient rehab approved but medically anticipate will be here at least through the weekend.     Subjective: Patient reports feeling okay today, cough is improving, some nausea in mid-day per RN report, no CP/SOB. Small BM 4 days ago. Pain is controlled.      ASSESSMENT & PLAN:   Principal Problem:   S/P colostomy takedown Active Problems:   Paroxysmal atrial fibrillation with RVR (HCC)   Acute kidney injury superimposed on stage IIIa CKD ([place])   Metastatic colon cancer to liver [facility])   Essential hypertension   Hyperlipidemia   Acquired hypothyroidism   Overweight (BMI 25.0-29.9)   Atrial fibrillation (HCC)  Paroxysmal atrial fibrillation with RVR ([place]) CHADS2VASC 6. Off amio/dilt d/t hypotension. Echocardiogram done 7/23, preserved EF, unable to assess diastolic function. Previous hospitalist discussed with general surgery.  Until she is further out from surgery, would not start full dose anticoagulation. Cardiology started low-dose metoprolol and patient has tolerated this well but BP remains soft.  Now on IV since is n.p.o. with NG in place.  Given change in condition, have reached out to cardiology as FYI in case need to reengage: advised Lopressor 5 mg IV every 6 hours as needed for rate over 846 hold for systolic pressure less than 100  Essential hypertension, now intermittently hypotensive Orthostatic hypotensive Currently hypotensive in part due to atrial fibrillation and being dry.   Home antihypertensives held Cardiology following --> s/o 07/26, started low-dose metoprolol now on IV since n.p.o. with NG in place BP remains a bit soft, continuing to monitor through today/tomorrow  Cough Abnormal CXR concerning for RLL pneumonia Community Acquired Pneumonia Smoker - interested in quitting Started Abx 10/25/21 w/ augmentin + azithro --> ID now that NG is in place, continue IV azithromycin and Zosyn Duonebs Pt declined nicotine replacement    S/P colostomy takedown Postoperative ileus Management as  per surgery. NG is in place PICC line planned for 10/29/2021,  TPN to start CT pending  Acquired hypothyroidism Continue Synthroid  Acute kidney injury superimposed on stage IIIa CKD ([place]) stable Lab Results  Component Value Date   CREATININE 1.36 (H) 10/29/2021   CREATININE 1.30 (H) 10/28/2021   CREATININE 1.20 (H) 10/27/2021   Hyperlipidemia Continue statin  Metastatic colon cancer to liver (HCC) Known history of adenocarcinoma of the colon causing large bowel obstruction requiring emergent laparotomy and subtotal colectomy with bowel perforation.  Recent PET scan found metastases to liver and lung.  Patient following with oncology as outpatient.  Secure chat to oncology physician on call as FYI sent 07/28  Overweight (BMI 25.0-29.9) Meets criteria for BMI greater than 25  DVT prophylaxis: SCD and Lovenox Code Status: FULL Family Communication: daughter at bedside  Dispo: PT has recommended HH PT.              Objective: Vitals:   10/29/21 0350 10/29/21 0642 10/29/21 0815 10/29/21 1131  BP: 94/60 105/60 (!) 92/53 107/63  Pulse: (!) 117 (!) 102 (!) 110 94  Resp: 19  17 17   Temp: 97.8 F (36.6 C)  98 F (36.7 C) 97.8 F (36.6 C)  TempSrc: Oral  Oral Oral  SpO2: 91%  92% 91%  Weight:      Height:        Intake/Output Summary (Last 24 hours) at 10/29/2021 1306 Last data filed at 10/29/2021 0800 Gross per 24 hour  Intake 2329.09 ml  Output 1980 ml  Net 349.09 ml   Filed Weights   10/28/2021 0807  Weight: 71.7 kg    Examination:  Constitutional:  VS as above General Appearance: alert, well-developed, well-nourished, NAD Eyes: Normal lids and conjunctive, non-icteric sclera PERRLA Ears, Nose, Mouth, Throat: Normal appearance Neck: No masses, trachea midline Respiratory: Normal respiratory effort Breath sounds clear except coarse at bilateral bases  Cardiovascular: S1/S2 normal, irreg irreg No lower extremity edema Gastrointestinal: Nontender, avoided deep palpation near surgical sites  Bowel sounds  present  Musculoskeletal:  No clubbing/cyanosis of digits Neurological: No cranial nerve deficit on limited exam Psychiatric: Normal judgment/insight Normal mood and affect       Scheduled Medications:   Chlorhexidine Gluconate Cloth  6 each Topical Daily   enoxaparin (LOVENOX) injection  1 mg/kg Subcutaneous Q12H   feeding supplement  237 mL Oral TID BM   insulin aspart  0-6 Units Subcutaneous Q4H   metoprolol tartrate  2.5 mg Intravenous Q8H   pantoprazole (PROTONIX) IV  40 mg Intravenous Q12H   sodium chloride flush  10-40 mL Intracatheter Q12H   sodium chloride flush  10-40 mL Intracatheter Q12H    Continuous Infusions:  sodium chloride Stopped (10/23/21 0003)   azithromycin     lactated ringers 100 mL/hr at 10/28/21 2240   lactated ringers     methocarbamol (ROBAXIN) IV     piperacillin-tazobactam (ZOSYN)  IV     TPN ADULT (ION)      PRN Medications:  sodium chloride, [DISCONTINUED] diphenhydrAMINE **OR** diphenhydrAMINE, ipratropium-albuterol, [DISCONTINUED] methocarbamol **OR** methocarbamol (ROBAXIN) IV, morphine injection, ondansetron **OR** ondansetron (ZOFRAN) IV, [DISCONTINUED] prochlorperazine **OR** prochlorperazine, sodium chloride flush, sodium chloride flush  Antimicrobials:  Anti-infectives (From admission, onward)    Start     Dose/Rate Route Frequency Ordered Stop   10/29/21 2100  azithromycin (ZITHROMAX) 500 mg in sodium chloride 0.9 % 250 mL IVPB        500 mg 250 mL/hr over 60 Minutes Intravenous  Every 24 hours 10/29/21 0949     10/29/21 1045  piperacillin-tazobactam (ZOSYN) IVPB 3.375 g        3.375 g 12.5 mL/hr over 240 Minutes Intravenous Every 12 hours 10/29/21 0949     10/27/21 1045  amoxicillin-clavulanate (AUGMENTIN) 875-125 MG per tablet 1 tablet  Status:  Discontinued        1 tablet Oral Every 12 hours 10/27/21 0959 10/29/21 0949   10/26/21 1000  azithromycin (ZITHROMAX) tablet 250 mg  Status:  Discontinued        250 mg Oral Daily  10/25/21 1357 10/29/21 0949   10/25/21 1445  amoxicillin-clavulanate (AUGMENTIN) 500-125 MG per tablet 500 mg  Status:  Discontinued        1 tablet Oral Every 12 hours 10/25/21 1357 10/27/21 0959   10/25/21 1445  azithromycin (ZITHROMAX) tablet 500 mg        500 mg Oral  Once 10/25/21 1357 10/25/21 1544   10/25/2021 2200  cefoTEtan (CEFOTAN) 2 g in sodium chloride 0.9 % 100 mL IVPB        2 g 200 mL/hr over 30 Minutes Intravenous Every 8 hours 10/05/2021 1739 10/22/21 0639   10/04/2021 1455  sodium chloride 0.9 % with cefoTEtan (CEFOTAN) ADS Med       Note to Pharmacy: Register, Santiago Glad A: cabinet override      10/09/2021 1455 10/12/2021 1549   10/29/2021 0816  sodium chloride 0.9 % with cefoTEtan (CEFOTAN) ADS Med       Note to Pharmacy: Herby Abraham W: cabinet override      10/06/2021 0816 10/06/2021 1139   10/06/2021 0730  cefoTEtan (CEFOTAN) 2 g in sodium chloride 0.9 % 100 mL IVPB        2 g 200 mL/hr over 30 Minutes Intravenous On call to O.R. 10/05/2021 0728 10/20/2021 1527       Data Reviewed: I have personally reviewed following labs and imaging studies  CBC: Recent Labs  Lab 10/25/21 0410 10/26/21 0550 10/27/21 1110 10/28/21 0525 10/29/21 0930  WBC 7.5 3.7* 5.4 8.8 12.6*  HGB 8.6* 8.4* 8.2* 8.0* 7.6*  HCT 26.2* 25.4* 25.4* 24.6* 23.8*  MCV 95.3 95.1 96.2 96.9 96.4  PLT 146* 153 164 171 433   Basic Metabolic Panel: Recent Labs  Lab 10/24/21 0829 10/25/21 0410 10/26/21 0550 10/27/21 1110 10/28/21 0525 10/29/21 0930  NA 133* 133* 135 132* 134* 135  K 3.9 3.7 3.6 3.4* 3.5 4.1  CL 105 106 108 107 107 107  CO2 19* 20* 17* 18* 19* 15*  GLUCOSE 117* 119* 104* 102* 100* 107*  BUN 20 20 31* 36* 34* 32*  CREATININE 1.61* 1.54* 1.27* 1.20* 1.30* 1.36*  CALCIUM 8.5* 8.3* 8.3* 8.1* 8.6* 8.7*  MG 2.2  --   --   --  1.9 2.0  PHOS 3.1  --   --   --  4.7* 5.6*   GFR: Estimated Creatinine Clearance: 33.1 mL/min (A) (by C-G formula based on SCr of 1.36 mg/dL (H)). Liver Function  Tests: Recent Labs  Lab 10/24/21 0829 10/25/21 0410 10/29/21 0930  AST 16 12* 14*  ALT 13 9 9   ALKPHOS 46 49 62  BILITOT 0.4 0.6 1.2  PROT 5.7* 5.5* 5.2*  ALBUMIN 2.9* 2.6* 2.1*   No results for input(s): "LIPASE", "AMYLASE" in the last 168 hours. No results for input(s): "AMMONIA" in the last 168 hours. Coagulation Profile: Recent Labs  Lab 10/24/21 0829 10/25/21 0410 10/29/21 0930  INR 1.5* 1.3* 1.2  Cardiac Enzymes: No results for input(s): "CKTOTAL", "CKMB", "CKMBINDEX", "TROPONINI" in the last 168 hours. BNP (last 3 results) No results for input(s): "PROBNP" in the last 8760 hours. HbA1C: No results for input(s): "HGBA1C" in the last 72 hours. CBG: Recent Labs  Lab 10/28/21 2208 10/29/21 0017 10/29/21 0359 10/29/21 0812 10/29/21 1130  GLUCAP 107* 104* 102* 101* 97   Lipid Profile: No results for input(s): "CHOL", "HDL", "LDLCALC", "TRIG", "CHOLHDL", "LDLDIRECT" in the last 72 hours. Thyroid Function Tests: No results for input(s): "TSH", "T4TOTAL", "FREET4", "T3FREE", "THYROIDAB" in the last 72 hours. Anemia Panel: No results for input(s): "VITAMINB12", "FOLATE", "FERRITIN", "TIBC", "IRON", "RETICCTPCT" in the last 72 hours. Urine analysis:    Component Value Date/Time   COLORURINE YELLOW (A) 11/14/2020 2000   APPEARANCEUR HAZY (A) 11/14/2020 2000   LABSPEC 1.027 11/14/2020 2000   PHURINE 6.0 11/14/2020 2000   GLUCOSEU NEGATIVE 11/14/2020 2000   HGBUR NEGATIVE 11/14/2020 2000   BILIRUBINUR NEGATIVE 11/14/2020 2000   KETONESUR NEGATIVE 11/14/2020 2000   PROTEINUR NEGATIVE 11/14/2020 2000   NITRITE NEGATIVE 11/14/2020 2000   LEUKOCYTESUR MODERATE (A) 11/14/2020 2000   Sepsis Labs: @LABRCNTIP (procalcitonin:4,lacticidven:4)  No results found for this or any previous visit (from the past 240 hour(s)).       Radiology Studies last 96 hours: [place] EKG SITE RITE  Result Date: 10/29/2021 If Site Rite image not attached, placement could not be  confirmed due to current cardiac rhythm.  DG ABD ACUTE 2+V W 1V CHEST  Result Date: 10/29/2021 CLINICAL DATA:  Postoperative ileus.  Abdominal pain. EXAM: DG ABDOMEN ACUTE WITH 1 VIEW CHEST COMPARISON:  October 28, 2021. FINDINGS: Distal tip of nasogastric tube is seen in expected position of proximal stomach. Surgical staples are noted. Mild bibasilar subsegmental atelectasis is noted. Surgical drain is seen with tip in the left upper quadrant. Mildly dilated small bowel loops are noted suggesting ileus. IMPRESSION: Mildly dilated small bowel loops are seen in left side of abdomen consistent with ileus. Distal tip of nasogastric tube seen in expected position of proximal stomach. Mild bibasilar subsegmental atelectasis. Electronically Signed   By: Marijo Conception M.D.   On: 10/29/2021 08:09   DG Abd 1 View  Result Date: 10/28/2021 CLINICAL DATA:  Feeding tube placement. EXAM: ABDOMEN - 1 VIEW COMPARISON:  October 28, 2021 FINDINGS: Nasogastric tube is seen with its distal tip noted within the body of the stomach. Its distal side hole is approximately 7.9 cm distal to the expected region of the gastroesophageal junction. The bowel gas pattern is normal. Radiopaque surgical clips are seen within the right upper quadrant. No radio-opaque calculi or other significant radiographic abnormality are seen. IMPRESSION: Nasogastric tube positioning, as described above. Electronically Signed   By: Virgina Norfolk M.D.   On: 10/28/2021 19:23   DG Abd 1 View  Result Date: 10/28/2021 CLINICAL DATA:  Vomiting yesterday. EXAM: ABDOMEN - 1 VIEW COMPARISON:  10/27/2021 FINDINGS: Skin clips and surgical drains consistent with recent surgery. Surgical clips in the right upper quadrant. Residual contrast material within sigmoid colonic diverticula. Mildly gas distended small bowel, similar to prior study. This is likely ileus. No radiopaque stones. Degenerative changes in the spine and right hip. Left hip arthroplasty.  IMPRESSION: Mild gaseous distention of small bowel similar to prior study, likely ileus in the postoperative setting. Recent postoperative changes in the abdomen and pelvis. Electronically Signed   By: Lucienne Capers M.D.   On: 10/28/2021 19:01   DG ABD ACUTE 2+V W 1V  CHEST  Result Date: 10/27/2021 CLINICAL DATA:  Ileus following gastrointestinal surgery EXAM: DG ABDOMEN ACUTE WITH 1 VIEW CHEST COMPARISON:  CT done on 08/12/2021 FINDINGS: There is moderate dilation of small bowel loops measuring up to 4.5 cm in diameter. Stomach is not distended. There are metallic densities in the region of sigmoid, possibly contrast within diverticula. There is a large caliber catheter in abdomen with its tip in the pelvis, possibly a surgical drain. Skin staples are seen. There is previous left hip arthroplasty. Transverse diameter of the heart is increased. There is crowding of markings in the left parahilar region and both lower lung fields suggesting subsegmental atelectasis. There is slight improvement in the aeration of medial right lower lung field since 10/25/2021. Tip of right IJ chest port is seen in superior vena cava. IMPRESSION: There is mild-to-moderate dilation of small-bowel loops suggesting ileus. Linear densities in left mid and both lower lung fields suggest subsegmental atelectasis. Electronically Signed   By: Elmer Picker M.D.   On: 10/27/2021 12:15            LOS: 8 days      Emeterio Reeve, DO [facility] 10/29/2021, 1:06 PM   Staff may message me via secure chat in [place]  but this may not receive immediate response,  please page for urgent matters!  If 7PM-7AM, please contact night-coverage [email or web address]  Dictation software was used to generate the above note. Typos may occur and escape review, as with typed/written notes. Please contact Dr Sheppard Coil directly for clarity if needed.

## 2021-10-29 NOTE — Telephone Encounter (Signed)
Per secure chat:  Jenna Reeve, Do: hi there, pt of Dr Rogue Bussing -here status post colostomy takedown, complicated by ileus/obstruction, scheduled for chemo early next week, family requested I reach out to Onc as FYI, thanks!  Dr. Tasia Catchings: he has appt with Dr.B on 8/1. again if he is not fully healed, no chemo will be given. how long will he stay in hospital?   [place]: not sue who long wil be in hospital, hopefully ileus/obstruction will resolve, plan is to go to inpatient rehab once improved.  Dr. Tasia Catchings: I will let his team know.  Seth Bake J: Will let Dr. B know when he gets back.

## 2021-10-30 ENCOUNTER — Inpatient Hospital Stay: Payer: Medicare HMO

## 2021-10-30 DIAGNOSIS — Z9889 Other specified postprocedural states: Secondary | ICD-10-CM | POA: Diagnosis not present

## 2021-10-30 LAB — CBC WITH DIFFERENTIAL/PLATELET
Abs Immature Granulocytes: 0.15 10*3/uL — ABNORMAL HIGH (ref 0.00–0.07)
Basophils Absolute: 0 10*3/uL (ref 0.0–0.1)
Basophils Relative: 0 %
Eosinophils Absolute: 0 10*3/uL (ref 0.0–0.5)
Eosinophils Relative: 0 %
HCT: 23.9 % — ABNORMAL LOW (ref 36.0–46.0)
Hemoglobin: 7.7 g/dL — ABNORMAL LOW (ref 12.0–15.0)
Immature Granulocytes: 1 %
Lymphocytes Relative: 5 %
Lymphs Abs: 0.7 10*3/uL (ref 0.7–4.0)
MCH: 31.3 pg (ref 26.0–34.0)
MCHC: 32.2 g/dL (ref 30.0–36.0)
MCV: 97.2 fL (ref 80.0–100.0)
Monocytes Absolute: 0.5 10*3/uL (ref 0.1–1.0)
Monocytes Relative: 4 %
Neutro Abs: 11.2 10*3/uL — ABNORMAL HIGH (ref 1.7–7.7)
Neutrophils Relative %: 90 %
Platelets: 197 10*3/uL (ref 150–400)
RBC: 2.46 MIL/uL — ABNORMAL LOW (ref 3.87–5.11)
RDW: 16.8 % — ABNORMAL HIGH (ref 11.5–15.5)
WBC: 12.6 10*3/uL — ABNORMAL HIGH (ref 4.0–10.5)
nRBC: 0.4 % — ABNORMAL HIGH (ref 0.0–0.2)

## 2021-10-30 LAB — GLUCOSE, CAPILLARY
Glucose-Capillary: 143 mg/dL — ABNORMAL HIGH (ref 70–99)
Glucose-Capillary: 150 mg/dL — ABNORMAL HIGH (ref 70–99)
Glucose-Capillary: 152 mg/dL — ABNORMAL HIGH (ref 70–99)
Glucose-Capillary: 151 mg/dL — ABNORMAL HIGH (ref 70–99)
Glucose-Capillary: 193 mg/dL — ABNORMAL HIGH (ref 70–99)

## 2021-10-30 LAB — CBC
HCT: 22.7 % — ABNORMAL LOW (ref 36.0–46.0)
Hemoglobin: 7.3 g/dL — ABNORMAL LOW (ref 12.0–15.0)
MCH: 31.1 pg (ref 26.0–34.0)
MCHC: 32.2 g/dL (ref 30.0–36.0)
MCV: 96.6 fL (ref 80.0–100.0)
Platelets: 181 10*3/uL (ref 150–400)
RBC: 2.35 MIL/uL — ABNORMAL LOW (ref 3.87–5.11)
RDW: 16.7 % — ABNORMAL HIGH (ref 11.5–15.5)
WBC: 12.3 10*3/uL — ABNORMAL HIGH (ref 4.0–10.5)
nRBC: 0.2 % (ref 0.0–0.2)

## 2021-10-30 LAB — MAGNESIUM: Magnesium: 2.2 mg/dL (ref 1.7–2.4)

## 2021-10-30 LAB — BLOOD GAS, ARTERIAL
Acid-base deficit: 8.1 mmol/L — ABNORMAL HIGH (ref 0.0–2.0)
Bicarbonate: 18.9 mmol/L — ABNORMAL LOW (ref 20.0–28.0)
O2 Content: 15 L/min
O2 Saturation: 99.5 %
Patient temperature: 37
pCO2 arterial: 43 mmHg (ref 32–48)
pH, Arterial: 7.25 — ABNORMAL LOW (ref 7.35–7.45)
pO2, Arterial: 115 mmHg — ABNORMAL HIGH (ref 83–108)

## 2021-10-30 LAB — BASIC METABOLIC PANEL
Anion gap: 7 (ref 5–15)
BUN: 42 mg/dL — ABNORMAL HIGH (ref 8–23)
CO2: 18 mmol/L — ABNORMAL LOW (ref 22–32)
Calcium: 8.4 mg/dL — ABNORMAL LOW (ref 8.9–10.3)
Chloride: 108 mmol/L (ref 98–111)
Creatinine, Ser: 1.12 mg/dL — ABNORMAL HIGH (ref 0.44–1.00)
GFR, Estimated: 50 mL/min — ABNORMAL LOW (ref >60)
Glucose, Bld: 204 mg/dL — ABNORMAL HIGH (ref 70–99)
Potassium: 4.5 mmol/L (ref 3.5–5.1)
Sodium: 133 mmol/L — ABNORMAL LOW (ref 135–145)

## 2021-10-30 LAB — LACTIC ACID, PLASMA: Lactic Acid, Venous: 1.1 mmol/L (ref 0.5–1.9)

## 2021-10-30 LAB — COMPREHENSIVE METABOLIC PANEL
ALT: 9 U/L (ref 0–44)
AST: 13 U/L — ABNORMAL LOW (ref 15–41)
Albumin: 2.4 g/dL — ABNORMAL LOW (ref 3.5–5.0)
Alkaline Phosphatase: 58 U/L (ref 38–126)
Anion gap: 8 (ref 5–15)
BUN: 37 mg/dL — ABNORMAL HIGH (ref 8–23)
CO2: 18 mmol/L — ABNORMAL LOW (ref 22–32)
Calcium: 8.5 mg/dL — ABNORMAL LOW (ref 8.9–10.3)
Chloride: 107 mmol/L (ref 98–111)
Creatinine, Ser: 1.25 mg/dL — ABNORMAL HIGH (ref 0.44–1.00)
GFR, Estimated: 44 mL/min — ABNORMAL LOW (ref >60)
Glucose, Bld: 148 mg/dL — ABNORMAL HIGH (ref 70–99)
Potassium: 4.3 mmol/L (ref 3.5–5.1)
Sodium: 133 mmol/L — ABNORMAL LOW (ref 135–145)
Total Bilirubin: 0.8 mg/dL (ref 0.3–1.2)
Total Protein: 5.5 g/dL — ABNORMAL LOW (ref 6.5–8.1)

## 2021-10-30 LAB — PROCALCITONIN: Procalcitonin: 1.59 ng/mL

## 2021-10-30 LAB — BRAIN NATRIURETIC PEPTIDE: B Natriuretic Peptide: 1170.7 pg/mL — ABNORMAL HIGH (ref 0.0–100.0)

## 2021-10-30 LAB — TRIGLYCERIDES: Triglycerides: 191 mg/dL — ABNORMAL HIGH (ref <150)

## 2021-10-30 LAB — PHOSPHORUS: Phosphorus: 4.9 mg/dL — ABNORMAL HIGH (ref 2.5–4.6)

## 2021-10-30 LAB — TROPONIN I (HIGH SENSITIVITY): Troponin I (High Sensitivity): 14 ng/L (ref <18)

## 2021-10-30 MED ORDER — SODIUM BICARBONATE 8.4 % IV SOLN
100.0000 meq | Freq: Once | INTRAVENOUS | Status: AC
Start: 2021-10-30 — End: 2021-10-30
  Administered 2021-10-30: 100 meq via INTRAVENOUS
  Filled 2021-10-30: qty 50

## 2021-10-30 MED ORDER — SODIUM CHLORIDE 0.9% IV SOLUTION
Freq: Once | INTRAVENOUS | Status: AC
Start: 2021-10-31 — End: 2021-10-31

## 2021-10-30 MED ORDER — SODIUM BICARBONATE 8.4 % IV SOLN
INTRAVENOUS | Status: AC
  Filled 2021-10-30: qty 50

## 2021-10-30 MED ORDER — LACTATED RINGERS IV SOLN: INTRAVENOUS | Status: DC

## 2021-10-30 MED ORDER — TRAVASOL 10 % IV SOLN
INTRAVENOUS | Status: AC
  Filled 2021-10-30: qty 1080

## 2021-10-30 NOTE — Progress Notes (Signed)
  PATIENT: Jenna [name]         WUJ:[id number]  DOB: 03/03/1941     Date of Service: 10/30/21  Subjective: Patient found with oxygen sats in 7s. Oxygen had become dislodged from wall. Improved initially improved with 5 L nasal cannula then required NRB. Oxygen requirement has been new since yest evening around 8PM. Had started treatment for pneumonia 7/24 RLL opacities Husband reports very poor attempts on incentive spirometer with very low volumes Patient states she feels better now Nurse reports decreased alertness from prior assessment. Chest xray image seen at bedside with considerably significant cardiomegalywithout history of heart failure Objective: Vitals:   10/30/21 2020 10/30/21 2030  BP: 106/64 101/60  Pulse: (!) 106 (!) 125  Resp: 20   Temp: (!) 97.5 F (36.4 C)   SpO2: 90% 93%  Chest xray image at  IMPRESSION: Worsening airspace opacities involving the mid to lower lung fields, right greater left, likely representing increased pleural effusion and associated atelectasis or infiltrate.  Echo 7/24 EF 55-60%, slighly limited but otherwise normal per report Procal improved 12.58 to 1.59 Renal function - stable GFR improved 50 Albumin remains low 2.4 BNP 1170, trop 14 Worsening anemia with HGB 11.8 on admit, now 7.7  Initiall ABG  Assessment:  Neuro intermittent confused, MAE equally, became lethargic prior to BIPAP initiation, general weakness Resp course crackles upper  airways, diminished bases CV a fib rate  100 GI NGT remains to LIWS  Plan:   Acute respiratory failure with hypoxia and hypercapnia BIPAP - wean as able Repeat abg 2 hours Low threshold for intubation given her debiilitated state Continue current antibiotics for pna Added flutter valve with incentive spirometer Repeat ABG with noted imporvement and repeat ordered for 0800 Ddimer slightly elevated, has risk factors for PE, defer to day provider to consider need  for CTA chest considering recent CTA testing  Uncompensated metabolic acidosis with bicarb deficit Resolved - was initaially given 2 amps bicar  Acute heart failure 40 lasix with blood transfusion initiation Strict I & O Daily weights Already on BB  Acute on chronic anemia, Transfuse 1 unit PRBC - consent obtained With new heart failure goal HGB above 8 Imporoved above 8 post transfusion. Additional 40 lasix ordered    Discussed above plan with husbancd who is in agreement   Author Kathlene Cote NP

## 2021-10-30 NOTE — Consult Note (Signed)
PHARMACY - TOTAL PARENTERAL NUTRITION CONSULT NOTE   Indication: Prolonged ileus  Patient Measurements: Height: 5\' 4"  (162.6 cm) Weight: 74.6 kg (164 lb 7.4 oz) IBW/kg (Calculated) : 54.7 TPN AdjBW (KG): 58.9 Body mass index is 28.23 kg/m. Usual Weight: 71.7kg  Assessment:  [name] w/ h/o colon cancer (adenocarcinoma with large bowel obstruction requiring emergent laparotomy and subtotal colectomy in August 2022) on 7/28 now POD8 from an ileostomy takedown complicated by a post-op illeus and N/V 7/27 midday. Pharmacy consulted for TPN and electrolyte mgmt.  Glucose / Insulin: (goal gluc <180; current 100-160s) Electrolytes:  K: 4.3 Mg: 2.2 Phos: 4.9 Renal: Scr 1.25 (baseline<1) Hepatic: LFTs WNL; Albumin 2.6 Intake / Output; MIVF:  on LR @100ml /h GI Imaging: 7/26 CT Abd - Mild-to-moderate dilation of small-bowel loops suggesting ileus. 7/26 KUB - Mild gaseous distention of small bowel similar to prior study, likely ileus in the postoperative setting. Recent postoperative changes in the abdomen and pelvis. 7/27  CT abd/pelvis w/ Con - pending result  GI Surgeries / Procedures:  August 2022 - large bowel obstruction requiring emergent laparotomy and subtotal colectomy October 21 2021 - Ileostomy take down.  Central access:  TPN start date: 10/29/2021  Nutritional Goals: Goal TPN rate is 75 mL/hr (provides 108 g of protein and 1767 kcals per day)  RD Assessment: Estimated Needs Total Energy Estimated Needs: 1700-1900 Total Protein Estimated Needs: 95-110 grams Total Fluid Estimated Needs: > 1.7 L  Current Nutrition:  TPN  Plan:  Increase TPN to goal rate 92mL/hr at 1800. Will supply: 1767 kcals; 108g protein, ~1876ml fluid  NOTE: Dex was reduced 7/28 from 15>10% to compensate for overfill with volume of bag. Please increase back to 15% 7/29 once at goal.  Electrolytes in TPN: Na 67mEq/L, K 32mEq/L, Ca 4mEq/L, Mg 18mEq/L, and Phos 22mmol/L(taken out of bag d/t  hyperphosphatemia). Cl:Ac 1:1 Add standard MVI and trace elements to TPN Will add Thiamine 100mg  (d2/3d) Initiate Sensitive q4h SSI and adjust as needed  Reduce MIVF to 50 mL/hr at 1800 Monitor TPN labs on Mon/Thurs, Will get additional set of labs in AM to assess new start.  Jenna [name] A Jenna [name] 10/30/2021,8:25 AM

## 2021-10-30 NOTE — Progress Notes (Signed)
Inpatient Rehab Admissions Coordinator:  Note pt on TPN. Pt not medically ready for possible CIR admission. Will continue to follow.   Gayland Curry, [place], [place] Admissions Coordinator [phone]

## 2021-10-30 NOTE — Progress Notes (Signed)
   10/30/21 2037  Clinical Encounter Type  Visited With Family  Visit Type Initial;Code  Consult/Referral To Chaplain   Chaplain responded to Rapid Response to provide support for the family member present.

## 2021-10-30 NOTE — Progress Notes (Signed)
Occupational Therapy Treatment Patient Details Name: Jenna [name] MRN: [id number] DOB: [birth year] Today's Date: 10/30/2021   History of present illness Nattaly is a 81 year old female with known history of adenocarcinoma of the descending colon causing large bowel obstruction requiring emergent laparotomy and subtotal colectomy with end ileostomy by Dr. Celine Ahr on August 2022, she did Perforate.  She had 29 nodes that were negative for metastatic disease.  She has been followed by oncology and recent PET/CT is consistent with metastatic disease to the liver.  There is also lung lesion c/w metastasis. Patient is S/P ileostomy take down, hernia repair and abdominal wall reconstruction 10/02/2021. Patient now with ileus 10/27/21.   OT comments  Chart reviewed, pt greeted in bed requesting to transfer to chair. Vital signs monitored throughout with education provided to pt and family [id number] to facilitate improved breathing. Pt with ted hose on throughout, no c/o dizziness. Increased time required for all mobility with significant rest break required at edge of bed following bed mobility. Pt transfers to bedside chair with MIN A with RW via step pivot transfer. Pt reports she urinated, declined clean up and requests staff comes back after she rests. RN and NT notified. Pt spo2 86-87% on 4 L via [place] in supine, down to 85% with to edge of bed with c/o SOB, >88% on 5 L via [place] seated at edge of bed, in chair. Rn aware of pt status. OT will continue to follow acutely.    Recommendations for follow up therapy are one component of a multi-disciplinary discharge planning process, led by the attending physician.  Recommendations may be updated based on patient status, additional functional criteria and insurance authorization.    Follow Up Recommendations  Acute inpatient rehab (3hours/day)    Assistance Recommended at Discharge Frequent or constant Supervision/Assistance  Patient can return home with the  following  A lot of help with walking and/or transfers   Equipment Recommendations  Other (comment) (per next venue of care)    Recommendations for Other Services      Precautions / Restrictions Precautions Precautions: Fall Restrictions Weight Bearing Restrictions: No Other Position/Activity Restrictions: Patient does not have to have abdominal binder for mobility, but may benefit and help with pain due to abdominal incisions, low BP.       Mobility Bed Mobility Overal bed mobility: Needs Assistance Bed Mobility: Supine to Sit     Supine to sit: Supervision, HOB elevated          Transfers Overall transfer level: Needs assistance   Transfers: Sit to/from Stand, Bed to chair/wheelchair/BSC Sit to Stand: Min assist, From elevated surface     Step pivot transfers: Min guard     General transfer comment: increased time, vcs for technique     Balance Overall balance assessment: Needs assistance Sitting-balance support: Feet supported       Standing balance support: Bilateral upper extremity supported, During functional activity, Reliant on assistive device for balance Standing balance-Leahy Scale: Fair                             ADL either performed or assessed with clinical judgement   ADL Overall ADL's : Needs assistance/impaired                                       General ADL Comments: SET UP for anticipated grooming  tasks    Extremity/Trunk Assessment              Vision       Perception     Praxis      Cognition Arousal/Alertness: Awake/alert Behavior During Therapy: WFL for tasks assessed/performed Overall Cognitive Status: Within Functional Limits for tasks assessed                                          Exercises      Shoulder Instructions       General Comments spo2 down to 85% on 4 L via [place] while seated at edge of bed >88% on 5 L via [place]    Pertinent Vitals/ Pain        Pain Assessment Pain Assessment: Faces Faces Pain Scale: Hurts little more Pain Location: generalized Pain Descriptors / Indicators: Grimacing Pain Intervention(s): Limited activity within patient's tolerance, Monitored during session, Repositioned  Home Living                                          Prior Functioning/Environment              Frequency  Min 4X/week        Progress Toward Goals  OT Goals(current goals can now be found in the care plan section)  Progress towards OT goals: Progressing toward goals     Plan Discharge plan remains appropriate    Co-evaluation                 AM-PAC OT "6 Clicks" Daily Activity     Outcome Measure   Help from another person eating meals?: None Help from another person taking care of personal grooming?: A Little Help from another person toileting, which includes using toliet, bedpan, or urinal?: A Lot Help from another person bathing (including washing, rinsing, drying)?: A Lot Help from another person to put on and taking off regular upper body clothing?: A Little Help from another person to put on and taking off regular lower body clothing?: A Lot 6 Click Score: 16    End of Session Equipment Utilized During Treatment: Rolling walker (2 wheels);Oxygen  OT Visit Diagnosis: Unsteadiness on feet (R26.81);Muscle weakness (generalized) (M62.81)   Activity Tolerance Patient tolerated treatment well   Patient Left in chair;with call bell/phone within reach;with chair alarm set;with family/visitor present   Nurse Communication Mobility status        Time: 9024-0973 OT Time Calculation (min): 28 min  Charges: OT General Charges $OT Visit: 1 Visit OT Treatments $Therapeutic Activity: 23-37 mins  Shanon Payor, OTD OTR/L  10/30/21, 1:03 PM

## 2021-10-30 NOTE — Progress Notes (Signed)
PROGRESS NOTE    Jenna [name]  WUJ:[id number] DOB: 1940/06/17  DOA: 10/30/2021 Date of Service: 10/30/21 PCP: [facility]     Brief Narrative / Hospital Course:   81 year old female with past medical history of colon cancer (adenocarcinoma with large bowel obstruction requiring emergent laparotomy and subtotal colectomy in August 2022), hypothyroidism and hyperlipidemia admitted on 7/20 by general surgery for colostomy reversal and abdominal wall reconstruction who developed new onset paroxysmal atrial fibrillation on 7/22.  Recent PET scan discovered metastases to liver and lung.   CARDIAC: Hospitalists were consulted for management of atrial fibrillation.  Patient started on IV amiodarone drip. Transitioned off and rate has been controlled. Cardiology consulted given hypotensive. Started low dose metoprolol. BP remained low, pt asymptomatic but not ambulating much. Have had to hold metoprolol at tiems d/t hypotension. 07/26: switching metoprolol to 12.5 mg bid and transition from lovenox to eliquis 5 mg bid at discretion of surgeon and hospitalist - cardiology has signed off. 07/27: BP low but stable, was hypotensive overnight which responded to IV fluids. 07/28-07/29: HR improving, BP stable.  RESPIRATORY / ID: Repeated CXR 07/24 to eval PNA given coughing: RLL findings appear progressed compared to previous CXR, initiated CAP treatment. Pt has been stable on room air, cough improving, usine her incentive spirometer.  SURGICAL / GI: Concern for ileus 07/26 but no evidence of serious complications. 07/27: no BM x4 days, (+)nausea mid-day progressed to vomiting. Placed NG tube for concern for obstruction, gen surg (Dr Dahlia Byes) following, NG helped bloating and symptoms. 07/28: TPN and PICC. CT showed ileus no obstruction or other intraabdominal concerns. 07/29: SOB improved on O2, pt feels can't get a deep breath, encouraged IC and OOB to chair Progression goals:  resolution/improvement of postoperative ileus, pending placement hoping to get inpatient rehab approved but anticipate will be here at least through the weekend / early next week pending medical stabilization.    Subjective: Patient reports feeling okay today, cough is improving, some nausea in mid-day per RN report, no CP/SOB. Small BM 4 days ago. Pain is controlled.      ASSESSMENT & PLAN:   Principal Problem:   S/P colostomy takedown Active Problems:   Paroxysmal atrial fibrillation with RVR (HCC)   Acute kidney injury superimposed on stage IIIa CKD ([place])   Metastatic colon cancer to liver [facility])   Essential hypertension   Hyperlipidemia   Acquired hypothyroidism   Overweight (BMI 25.0-29.9)   Atrial fibrillation (HCC)  Paroxysmal atrial fibrillation with RVR ([place]) CHADS2VASC 6. Off amio/dilt d/t hypotension. Echocardiogram done 7/23, preserved EF, unable to assess diastolic function. Previous hospitalist discussed with general surgery.  Until she is further out from surgery, would not start full dose anticoagulation. Cardiology started low-dose metoprolol and patient has tolerated this well but BP remains soft.  Now on IV since is n.p.o. with NG in place.  Given change in condition, have reached out to cardiology as FYI in case need to reengage: advised Lopressor 5 mg IV every 6 hours as needed for rate over 956 hold for systolic pressure less than 100  Essential hypertension, now intermittently hypotensive Orthostatic hypotensive Currently hypotensive in part due to atrial fibrillation and being dry. CT abdomen no infection. Home antihypertensives held. Cardiology following --> s/o 07/26, started low-dose metoprolol now on IV since n.p.o. with NG in place BP remains a bit soft, but improving  Cough Abnormal CXR concerning for RLL pneumonia Community Acquired Pneumonia Smoker - interested in quitting Started Abx 10/25/21 w/  augmentin + azithro --> ID now that NG is in place,  continue IV azithromycin and Zosyn. May d/c tomorrow but would consider CXR first  Duonebs Pt declined nicotine replacement   Pt declined CXR today   S/P colostomy takedown Postoperative ileus Management as per surgery. NG is in place PICC line and TPN 10/29/2021 CT abd/pelv no obstruction or other sign complication   Acquired hypothyroidism Continue Synthroid  Acute kidney injury superimposed on stage IIIa CKD ([place]) Stable/improving Lab Results  Component Value Date   CREATININE 1.25 (H) 10/30/2021   CREATININE 1.36 (H) 10/29/2021   CREATININE 1.30 (H) 10/28/2021   Hyperlipidemia Continue statin  Metastatic colon cancer to liver (HCC) Known history of adenocarcinoma of the colon causing large bowel obstruction requiring emergent laparotomy and subtotal colectomy with bowel perforation.  Recent PET scan found metastases to liver and lung.  Patient following with oncology as outpatient.  Secure chat to oncology physician on call as FYI sent 07/28  Overweight (BMI 25.0-29.9) Meets criteria for BMI greater than 25   DVT prophylaxis: SCD and Lovenox Code Status: FULL Family Communication: daughter at bedside, also met her husband today  Dispo: eval for inpatient rehab is ongoing but remains inpatient for now               Objective: Vitals:   10/30/21 0408 10/30/21 0807 10/30/21 1246 10/30/21 1256  BP: (!) 101/52 118/65  122/79  Pulse: 84 89  (!) 101  Resp: _0 Temp: 97.6 F (36.4 C) 98.1 F (36.7 C)  98.2 F (36.8 C)  TempSrc: Oral     SpO2: 93% 92% 90% 94%  Weight:      Height:        Intake/Output Summary (Last 24 hours) at 10/30/2021 1307 Last data filed at 10/30/2021 0445 Gross per 24 hour  Intake 1868.83 ml  Output 1980 ml  Net -111.17 ml   Filed Weights   10/29/2021 0807 10/29/21 1655  Weight: 71.7 kg 74.6 kg    Examination:  Constitutional:  VS as above General Appearance: alert, well-developed, well-nourished, NAD Eyes: Normal  lids and conjunctive, non-icteric sclera PERRLA Ears, Nose, Mouth, Throat: Normal appearance Neck: No masses, trachea midline Respiratory: Normal respiratory effort Breath sounds clear except might be a bit more coarse today at bilateral bases  Cardiovascular: S1/S2 normal, irreg irreg No lower extremity edema Gastrointestinal: Nontender, avoided deep palpation near surgical sites  Bowel sounds present  Musculoskeletal:  No clubbing/cyanosis of digits Neurological: No cranial nerve deficit on limited exam Psychiatric: Normal judgment/insight Normal mood and affect       Scheduled Medications:   Chlorhexidine Gluconate Cloth  6 each Topical Daily   enoxaparin (LOVENOX) injection  1.25 mg/kg Subcutaneous Q12H   insulin aspart  0-6 Units Subcutaneous Q4H   pantoprazole (PROTONIX) IV  40 mg Intravenous Q12H   sodium chloride flush  10-40 mL Intracatheter Q12H   sodium chloride flush  10-40 mL Intracatheter Q12H    Continuous Infusions:  sodium chloride Stopped (10/23/21 0003)   azithromycin Stopped (10/29/21 2201)   lactated ringers 50 mL/hr at 10/30/21 0400   lactated ringers     methocarbamol (ROBAXIN) IV     piperacillin-tazobactam (ZOSYN)  IV 3.375 g (10/30/21 1042)   TPN ADULT (ION) 37 mL/hr at 10/30/21 0400   TPN ADULT (ION)      PRN Medications:  sodium chloride, [DISCONTINUED] diphenhydrAMINE **OR** diphenhydrAMINE, iohexol, ipratropium-albuterol, [DISCONTINUED] methocarbamol **OR** methocarbamol (ROBAXIN) IV, metoprolol tartrate, morphine injection,  ondansetron **OR** ondansetron (ZOFRAN) IV, [DISCONTINUED] prochlorperazine **OR** prochlorperazine, sodium chloride flush, sodium chloride flush  Antimicrobials:  Anti-infectives (From admission, onward)    Start     Dose/Rate Route Frequency Ordered Stop   10/29/21 2100  azithromycin (ZITHROMAX) 500 mg in sodium chloride 0.9 % 250 mL IVPB        500 mg 250 mL/hr over 60 Minutes Intravenous Every 24 hours  10/29/21 0949     10/29/21 1045  piperacillin-tazobactam (ZOSYN) IVPB 3.375 g        3.375 g 12.5 mL/hr over 240 Minutes Intravenous Every 12 hours 10/29/21 0949     10/27/21 1045  amoxicillin-clavulanate (AUGMENTIN) 875-125 MG per tablet 1 tablet  Status:  Discontinued        1 tablet Oral Every 12 hours 10/27/21 0959 10/29/21 0949   10/26/21 1000  azithromycin (ZITHROMAX) tablet 250 mg  Status:  Discontinued        250 mg Oral Daily 10/25/21 1357 10/29/21 0949   10/25/21 1445  amoxicillin-clavulanate (AUGMENTIN) 500-125 MG per tablet 500 mg  Status:  Discontinued        1 tablet Oral Every 12 hours 10/25/21 1357 10/27/21 0959   10/25/21 1445  azithromycin (ZITHROMAX) tablet 500 mg        500 mg Oral  Once 10/25/21 1357 10/25/21 1544   10/03/2021 2200  cefoTEtan (CEFOTAN) 2 g in sodium chloride 0.9 % 100 mL IVPB        2 g 200 mL/hr over 30 Minutes Intravenous Every 8 hours 10/02/2021 1739 10/22/21 0639   10/17/2021 1455  sodium chloride 0.9 % with cefoTEtan (CEFOTAN) ADS Med       Note to Pharmacy: Register, Santiago Glad A: cabinet override      10/02/2021 1455 10/06/2021 1549   10/05/2021 0816  sodium chloride 0.9 % with cefoTEtan (CEFOTAN) ADS Med       Note to Pharmacy: Herby Abraham W: cabinet override      10/05/2021 0816 10/31/2021 1139   10/07/2021 0730  cefoTEtan (CEFOTAN) 2 g in sodium chloride 0.9 % 100 mL IVPB        2 g 200 mL/hr over 30 Minutes Intravenous On call to O.R. 10/02/2021 0728 10/09/2021 1527       Data Reviewed: I have personally reviewed following labs and imaging studies  CBC: Recent Labs  Lab 10/26/21 0550 10/27/21 1110 10/28/21 0525 10/29/21 0930 10/30/21 0610  WBC 3.7* 5.4 8.8 12.6* 12.3*  HGB 8.4* 8.2* 8.0* 7.6* 7.3*  HCT 25.4* 25.4* 24.6* 23.8* 22.7*  MCV 95.1 96.2 96.9 96.4 96.6  PLT 153 164 171 186 476   Basic Metabolic Panel: Recent Labs  Lab 10/24/21 0829 10/25/21 0410 10/26/21 0550 10/27/21 1110 10/28/21 0525 10/29/21 0930 10/30/21 0610  NA 133*   < >  135 132* 134* 135 133*  K 3.9   < > 3.6 3.4* 3.5 4.1 4.3  CL 105   < > 108 107 107 107 107  CO2 19*   < > 17* 18* 19* 15* 18*  GLUCOSE 117*   < > 104* 102* 100* 107* 148*  BUN 20   < > 31* 36* 34* 32* 37*  CREATININE 1.61*   < > 1.27* 1.20* 1.30* 1.36* 1.25*  CALCIUM 8.5*   < > 8.3* 8.1* 8.6* 8.7* 8.5*  MG 2.2  --   --   --  1.9 2.0 2.2  PHOS 3.1  --   --   --  4.7* 5.6*  4.9*   < > = values in this interval not displayed.   GFR: Estimated Creatinine Clearance: 36.7 mL/min (A) (by C-G formula based on SCr of 1.25 mg/dL (H)). Liver Function Tests: Recent Labs  Lab 10/24/21 0829 10/25/21 0410 10/29/21 0930 10/30/21 0610  AST 16 12* 14* 13*  ALT _0 ALKPHOS 46 49 62 58  BILITOT 0.4 0.6 1.2 0.8  PROT 5.7* 5.5* 5.2* 5.5*  ALBUMIN 2.9* 2.6* 2.1* 2.4*   No results for input(s): "LIPASE", "AMYLASE" in the last 168 hours. No results for input(s): "AMMONIA" in the last 168 hours. Coagulation Profile: Recent Labs  Lab 10/24/21 0829 10/25/21 0410 10/29/21 0930  INR 1.5* 1.3* 1.2   Cardiac Enzymes: No results for input(s): "CKTOTAL", "CKMB", "CKMBINDEX", "TROPONINI" in the last 168 hours. BNP (last 3 results) No results for input(s): "PROBNP" in the last 8760 hours. HbA1C: No results for input(s): "HGBA1C" in the last 72 hours. CBG: Recent Labs  Lab 10/29/21 2020 10/29/21 2340 10/30/21 0417 10/30/21 0809 10/30/21 1256  GLUCAP 129* 129* 143* 150* 152*   Lipid Profile: Recent Labs    10/30/21 0610  TRIG 191*   Thyroid Function Tests: No results for input(s): "TSH", "T4TOTAL", "FREET4", "T3FREE", "THYROIDAB" in the last 72 hours. Anemia Panel: No results for input(s): "VITAMINB12", "FOLATE", "FERRITIN", "TIBC", "IRON", "RETICCTPCT" in the last 72 hours. Urine analysis:    Component Value Date/Time   COLORURINE YELLOW (A) 11/14/2020 2000   APPEARANCEUR HAZY (A) 11/14/2020 2000   LABSPEC 1.027 11/14/2020 2000   PHURINE 6.0 11/14/2020 2000   GLUCOSEU NEGATIVE  11/14/2020 2000   HGBUR NEGATIVE 11/14/2020 2000   BILIRUBINUR NEGATIVE 11/14/2020 2000   KETONESUR NEGATIVE 11/14/2020 2000   PROTEINUR NEGATIVE 11/14/2020 2000   NITRITE NEGATIVE 11/14/2020 2000   LEUKOCYTESUR MODERATE (A) 11/14/2020 2000   Sepsis Labs: _1 (procalcitonin:4,lacticidven:4)  No results found for this or any previous visit (from the past 240 hour(s)).       Radiology Studies last 96 hours: CT ABDOMEN PELVIS W CONTRAST  Result Date: 10/29/2021 CLINICAL DATA:  Abdominal pain, nonlocalized EXAM: CT ABDOMEN AND PELVIS WITH CONTRAST TECHNIQUE: Multidetector CT imaging of the abdomen and pelvis was performed using the standard protocol following bolus administration of intravenous contrast. RADIATION DOSE REDUCTION: This exam was performed according to the departmental dose-optimization program which includes automated exposure control, adjustment of the mA and/or kV according to patient size and/or use of iterative reconstruction technique. CONTRAST:  162m OMNIPAQUE IOHEXOL 300 MG/ML  SOLN COMPARISON:  CT chest, abdomen and pelvis dated Aug 12, 2021. FINDINGS: Lower chest: Small bilateral pleural effusions and atelectasis. Hepatobiliary: Small lesion which was previously described in the anterior inferior right lobe of the liver is no longer visualized. Suspicious liver lesions. Status post cholecystectomy. No biliary dilatation. Pancreas: Unremarkable. No pancreatic ductal dilatation or surrounding inflammatory changes. Spleen: Normal in size without focal abnormality. Adrenals/Urinary Tract: Bilateral adrenal glands are unremarkable. No hydronephrosis. Nonobstructing stone of the right kidney. Stable simple appearing cyst of the left kidney. Small locule of air is seen in the urinary bladder, correlate for history of recent instrumentation. No evidence of bladder wall thickening, however streak artifact from left hip arthroplasty somewhat limits evaluation. Stomach/Bowel:  Interval ileostomy takedown. Diffusely dilated small bowel with a gradual transition of the site of the anastomosis. Normal appearance of the anastomosis. Small bowel feces sign seen just upstream of the anastomosis, compatible with delayed transit. No bowel wall thickening or evidence of pneumatosis. Contrast material  is seen in the proximal small bowel loops which becomes diluted, no definite passage of contrast beyond the anastomosis. Diverticulosis of the sigmoid colon with retained contrast material seen in several diverticula. Vascular/Lymphatic: Aortic atherosclerosis. New prominent subcentimeter mesenteric lymph nodes seen in the right lower quadrant, reference node measuring 9 mm on series 2, image 57. No pathologically enlarged lymph nodes seen in the pelvis. Reproductive: No pelvic mass. Other: Trace abdominal ascites and small locules of intraperitoneal free air, likely postsurgical. Postsurgical changes of the anterior right abdominal wall with surgical drain place. Musculoskeletal: Prior left total hip arthroplasty. No aggressive appearing osseous lesions. IMPRESSION: 1. Interval ileostomy takedown. Diffusely dilated small bowel, findings are compatible with ileus. 2. Small bilateral pleural effusions and atelectasis. 3. New prominent subcentimeter mesenteric lymph nodes are seen in the right lower quadrant, likely reactive. Recommend attention on follow-up per clinical protocol. 4. Small locule of air is seen in the urinary bladder, likely due to recent instrumentation. Infection is an additional consideration. Correlate with urinalysis. 5.  Aortic Atherosclerosis (ICD10-I70.0). Electronically Signed   By: Yetta Glassman M.D.   On: 10/29/2021 14:52   DG Chest Port 1 View  Result Date: 10/29/2021 CLINICAL DATA:  PICC line placement. EXAM: PORTABLE CHEST 1 VIEW COMPARISON:  Same day FINDINGS: Interval placement of right-sided PICC line with distal tip in expected position of the SVC. Right  internal jugular Port-A-Cath is unchanged. Stable bibasilar atelectasis and effusions are noted. IMPRESSION: Interval placement of right-sided PICC line with distal tip in expected position of the SVC. Electronically Signed   By: Marijo Conception M.D.   On: 10/29/2021 14:09   [place] EKG SITE RITE  Result Date: 10/29/2021 If Site Rite image not attached, placement could not be confirmed due to current cardiac rhythm.  DG ABD ACUTE 2+V W 1V CHEST  Result Date: 10/29/2021 CLINICAL DATA:  Postoperative ileus.  Abdominal pain. EXAM: DG ABDOMEN ACUTE WITH 1 VIEW CHEST COMPARISON:  October 28, 2021. FINDINGS: Distal tip of nasogastric tube is seen in expected position of proximal stomach. Surgical staples are noted. Mild bibasilar subsegmental atelectasis is noted. Surgical drain is seen with tip in the left upper quadrant. Mildly dilated small bowel loops are noted suggesting ileus. IMPRESSION: Mildly dilated small bowel loops are seen in left side of abdomen consistent with ileus. Distal tip of nasogastric tube seen in expected position of proximal stomach. Mild bibasilar subsegmental atelectasis. Electronically Signed   By: Marijo Conception M.D.   On: 10/29/2021 08:09   DG Abd 1 View  Result Date: 10/28/2021 CLINICAL DATA:  Feeding tube placement. EXAM: ABDOMEN - 1 VIEW COMPARISON:  October 28, 2021 FINDINGS: Nasogastric tube is seen with its distal tip noted within the body of the stomach. Its distal side hole is approximately 7.9 cm distal to the expected region of the gastroesophageal junction. The bowel gas pattern is normal. Radiopaque surgical clips are seen within the right upper quadrant. No radio-opaque calculi or other significant radiographic abnormality are seen. IMPRESSION: Nasogastric tube positioning, as described above. Electronically Signed   By: Virgina Norfolk M.D.   On: 10/28/2021 19:23   DG Abd 1 View  Result Date: 10/28/2021 CLINICAL DATA:  Vomiting yesterday. EXAM: ABDOMEN - 1 VIEW  COMPARISON:  10/27/2021 FINDINGS: Skin clips and surgical drains consistent with recent surgery. Surgical clips in the right upper quadrant. Residual contrast material within sigmoid colonic diverticula. Mildly gas distended small bowel, similar to prior study. This is likely ileus. No radiopaque stones.  Degenerative changes in the spine and right hip. Left hip arthroplasty. IMPRESSION: Mild gaseous distention of small bowel similar to prior study, likely ileus in the postoperative setting. Recent postoperative changes in the abdomen and pelvis. Electronically Signed   By: Lucienne Capers M.D.   On: 10/28/2021 19:01   DG ABD ACUTE 2+V W 1V CHEST  Result Date: 10/27/2021 CLINICAL DATA:  Ileus following gastrointestinal surgery EXAM: DG ABDOMEN ACUTE WITH 1 VIEW CHEST COMPARISON:  CT done on 08/12/2021 FINDINGS: There is moderate dilation of small bowel loops measuring up to 4.5 cm in diameter. Stomach is not distended. There are metallic densities in the region of sigmoid, possibly contrast within diverticula. There is a large caliber catheter in abdomen with its tip in the pelvis, possibly a surgical drain. Skin staples are seen. There is previous left hip arthroplasty. Transverse diameter of the heart is increased. There is crowding of markings in the left parahilar region and both lower lung fields suggesting subsegmental atelectasis. There is slight improvement in the aeration of medial right lower lung field since 10/25/2021. Tip of right IJ chest port is seen in superior vena cava. IMPRESSION: There is mild-to-moderate dilation of small-bowel loops suggesting ileus. Linear densities in left mid and both lower lung fields suggest subsegmental atelectasis. Electronically Signed   By: Elmer Picker M.D.   On: 10/27/2021 12:15            LOS: 9 days      Emeterio Reeve, DO [facility] 10/30/2021, 1:07 PM   Staff may message me via secure chat in [place]  but this may not receive  immediate response,  please page for urgent matters!  If 7PM-7AM, please contact night-coverage [email or web address]  Dictation software was used to generate the above note. Typos may occur and escape review, as with typed/written notes. Please contact Dr Sheppard Coil directly for clarity if needed.

## 2021-10-30 NOTE — Progress Notes (Signed)
10/30/2021  Subjective: Patient is 9 Days Post-Op status post ileostomy reversal with abdominal wall reconstruction.  No acute events overnight.  Patient was started on TPN yesterday after CT scan confirmed postoperative ileus.  CT scan images personally reviewed and discussed with the patient today.  Overall there is no evidence of any contrast extravasation but the small bowel is diffusely dilated.  No flatus today.  Denies any nausea.  Vital signs: Temp:  [97.6 F (36.4 C)-98.7 F (37.1 C)] 98.1 F (36.7 C) (07/29 0807) Pulse Rate:  [83-130] 89 (07/29 0807) Resp:  [16-20] 16 (07/29 0807) BP: (98-118)/(52-78) 118/65 (07/29 0807) SpO2:  [88 %-96 %] 92 % (07/29 0807) Weight:  [74.6 kg] 74.6 kg (07/28 1655)   Intake/Output: 07/28 0701 - 07/29 0700 In: 1868.8 [I.V.:825.1; NG/GT:600; IV Piggyback:443.8] Out: 2150 [Urine:1100; Emesis/NG output:1010; Drains:40] Last BM Date : 10/25/21  Physical Exam: Constitutional: No acute distress Abdomen: Soft, nondistended, appropriately sore to palpation.  Incisions are clean, dry with some old drainage on the gauze that is serosanguineous.  No evidence of infection.  Drain with serosanguineous fluid as well.  NG tube with bilious contents.  Labs:  Recent Labs    10/29/21 0930 10/30/21 0610  WBC 12.6* 12.3*  HGB 7.6* 7.3*  HCT 23.8* 22.7*  PLT 186 181   Recent Labs    10/29/21 0930 10/30/21 0610  NA 135 133*  K 4.1 4.3  CL 107 107  CO2 15* 18*  GLUCOSE 107* 148*  BUN 32* 37*  CREATININE 1.36* 1.25*  CALCIUM 8.7* 8.5*   Recent Labs    10/29/21 0930  LABPROT 15.2  INR 1.2    Imaging: CT ABDOMEN PELVIS W CONTRAST  Result Date: 10/29/2021 CLINICAL DATA:  Abdominal pain, nonlocalized EXAM: CT ABDOMEN AND PELVIS WITH CONTRAST TECHNIQUE: Multidetector CT imaging of the abdomen and pelvis was performed using the standard protocol following bolus administration of intravenous contrast. RADIATION DOSE REDUCTION: This exam was  performed according to the departmental dose-optimization program which includes automated exposure control, adjustment of the mA and/or kV according to patient size and/or use of iterative reconstruction technique. CONTRAST:  121mL OMNIPAQUE IOHEXOL 300 MG/ML  SOLN COMPARISON:  CT chest, abdomen and pelvis dated Aug 12, 2021. FINDINGS: Lower chest: Small bilateral pleural effusions and atelectasis. Hepatobiliary: Small lesion which was previously described in the anterior inferior right lobe of the liver is no longer visualized. Suspicious liver lesions. Status post cholecystectomy. No biliary dilatation. Pancreas: Unremarkable. No pancreatic ductal dilatation or surrounding inflammatory changes. Spleen: Normal in size without focal abnormality. Adrenals/Urinary Tract: Bilateral adrenal glands are unremarkable. No hydronephrosis. Nonobstructing stone of the right kidney. Stable simple appearing cyst of the left kidney. Small locule of air is seen in the urinary bladder, correlate for history of recent instrumentation. No evidence of bladder wall thickening, however streak artifact from left hip arthroplasty somewhat limits evaluation. Stomach/Bowel: Interval ileostomy takedown. Diffusely dilated small bowel with a gradual transition of the site of the anastomosis. Normal appearance of the anastomosis. Small bowel feces sign seen just upstream of the anastomosis, compatible with delayed transit. No bowel wall thickening or evidence of pneumatosis. Contrast material is seen in the proximal small bowel loops which becomes diluted, no definite passage of contrast beyond the anastomosis. Diverticulosis of the sigmoid colon with retained contrast material seen in several diverticula. Vascular/Lymphatic: Aortic atherosclerosis. New prominent subcentimeter mesenteric lymph nodes seen in the right lower quadrant, reference node measuring 9 mm on series 2, image 57. No pathologically  enlarged lymph nodes seen in the pelvis.  Reproductive: No pelvic mass. Other: Trace abdominal ascites and small locules of intraperitoneal free air, likely postsurgical. Postsurgical changes of the anterior right abdominal wall with surgical drain place. Musculoskeletal: Prior left total hip arthroplasty. No aggressive appearing osseous lesions. IMPRESSION: 1. Interval ileostomy takedown. Diffusely dilated small bowel, findings are compatible with ileus. 2. Small bilateral pleural effusions and atelectasis. 3. New prominent subcentimeter mesenteric lymph nodes are seen in the right lower quadrant, likely reactive. Recommend attention on follow-up per clinical protocol. 4. Small locule of air is seen in the urinary bladder, likely due to recent instrumentation. Infection is an additional consideration. Correlate with urinalysis. 5.  Aortic Atherosclerosis (ICD10-I70.0). Electronically Signed   By: Yetta Glassman M.D.   On: 10/29/2021 14:52   DG Chest Port 1 View  Result Date: 10/29/2021 CLINICAL DATA:  PICC line placement. EXAM: PORTABLE CHEST 1 VIEW COMPARISON:  Same day FINDINGS: Interval placement of right-sided PICC line with distal tip in expected position of the SVC. Right internal jugular Port-A-Cath is unchanged. Stable bibasilar atelectasis and effusions are noted. IMPRESSION: Interval placement of right-sided PICC line with distal tip in expected position of the SVC. Electronically Signed   By: Marijo Conception M.D.   On: 10/29/2021 14:09    Assessment/Plan: This is a 81 y.o. female s/p ileostomy reversal with abdominal wall reconstruction.  - Discussed with the patient and her daughter the CT scan findings.  At this point the patient has a postoperative ileus and for now we will have to wait until return of bowel function.  In the meantime, we will continue NG tube, TPN for nutrition, appropriate pain control, and also antibiotics for her pneumonia.  Encouraged her to be out of bed as tolerated and to use her incentive spirometer.  She  may chew gum in order to promote bowel function.   Melvyn Neth, [facility]

## 2021-10-30 NOTE — Progress Notes (Incomplete)
     Subjective: Patient found with oxygen sats in 70s. Oxygen had become dislodged from wall. Improved initially improved with 5 L nasal cannula then required NRB. Osygen requirement has been new since yest evening around 8PM. Had started treatment for pneumonia 7/24 RLL opacities Husband reports very poor attempts on incentive spirometer with very volumes Patient states she feels better now Nurse reports decreased alertness from prior assessment. Chest xray image seen at bedside with considerably significant cardiomegaly without history of heart failure Objective: Vitals:   10/30/21 2020 10/30/21 2030  BP: 106/64 101/60  Pulse: (!) 106 (!) 125  Resp: 20   Temp: (!) 97.5 F (36.4 C)   SpO2: 90% 93%  Chest xray image at  IMPRESSION: Worsening airspace opacities involving the mid to lower lung fields, right greater left, likely representing increased pleural effusion and associated atelectasis or infiltrate.  Assessment: ***  Plan:   Acute respiratory failure with hypoxia  Uncompensated metabolic acidosis with bicarb deficit  Acute heart failure   Acute on chronic anemia

## 2021-10-30 NOTE — Plan of Care (Signed)

## 2021-10-31 DIAGNOSIS — Z9889 Other specified postprocedural states: Secondary | ICD-10-CM | POA: Diagnosis not present

## 2021-10-31 LAB — BLOOD GAS, ARTERIAL
Acid-base deficit: 7.4 mmol/L — ABNORMAL HIGH (ref 0.0–2.0)
Acid-base deficit: 5.5 mmol/L — ABNORMAL HIGH (ref 0.0–2.0)
Acid-base deficit: 3.8 mmol/L — ABNORMAL HIGH (ref 0.0–2.0)
Acid-base deficit: 4 mmol/L — ABNORMAL HIGH (ref 0.0–2.0)
Bicarbonate: 20.5 mmol/L (ref 20.0–28.0)
Bicarbonate: 20.7 mmol/L (ref 20.0–28.0)
Bicarbonate: 21.5 mmol/L (ref 20.0–28.0)
Bicarbonate: 22.6 mmol/L (ref 20.0–28.0)
Delivery systems: POSITIVE
Delivery systems: POSITIVE
Expiratory PAP: 8 cmH2O
Expiratory PAP: 8 cmH2O
FIO2: 100 %
FIO2: 50 %
FIO2: 0.6 %
Inspiratory PAP: 16 cmH2O
Inspiratory PAP: 16 cmH2O
O2 Content: 60 L/min
O2 Saturation: 99.4 %
O2 Saturation: 97.6 %
O2 Saturation: 99.7 %
O2 Saturation: 93.7 %
Patient temperature: 37
Patient temperature: 37
Patient temperature: 37
Patient temperature: 37
pCO2 arterial: 50 mmHg — ABNORMAL HIGH (ref 32–48)
pCO2 arterial: 42 mmHg (ref 32–48)
pCO2 arterial: 39 mmHg (ref 32–48)
pCO2 arterial: 46 mmHg (ref 32–48)
pH, Arterial: 7.22 — ABNORMAL LOW (ref 7.35–7.45)
pH, Arterial: 7.3 — ABNORMAL LOW (ref 7.35–7.45)
pH, Arterial: 7.35 (ref 7.35–7.45)
pH, Arterial: 7.3 — ABNORMAL LOW (ref 7.35–7.45)
pO2, Arterial: 108 mmHg (ref 83–108)
pO2, Arterial: 77 mmHg — ABNORMAL LOW (ref 83–108)
pO2, Arterial: 109 mmHg — ABNORMAL HIGH (ref 83–108)
pO2, Arterial: 65 mmHg — ABNORMAL LOW (ref 83–108)

## 2021-10-31 LAB — GLUCOSE, CAPILLARY
Glucose-Capillary: 159 mg/dL — ABNORMAL HIGH (ref 70–99)
Glucose-Capillary: 167 mg/dL — ABNORMAL HIGH (ref 70–99)
Glucose-Capillary: 172 mg/dL — ABNORMAL HIGH (ref 70–99)
Glucose-Capillary: 175 mg/dL — ABNORMAL HIGH (ref 70–99)
Glucose-Capillary: 199 mg/dL — ABNORMAL HIGH (ref 70–99)
Glucose-Capillary: 221 mg/dL — ABNORMAL HIGH (ref 70–99)

## 2021-10-31 LAB — BASIC METABOLIC PANEL
Anion gap: 7 (ref 5–15)
BUN: 44 mg/dL — ABNORMAL HIGH (ref 8–23)
CO2: 21 mmol/L — ABNORMAL LOW (ref 22–32)
Calcium: 8.3 mg/dL — ABNORMAL LOW (ref 8.9–10.3)
Chloride: 108 mmol/L (ref 98–111)
Creatinine, Ser: 1.12 mg/dL — ABNORMAL HIGH (ref 0.44–1.00)
GFR, Estimated: 50 mL/min — ABNORMAL LOW (ref >60)
Glucose, Bld: 196 mg/dL — ABNORMAL HIGH (ref 70–99)
Potassium: 4.4 mmol/L (ref 3.5–5.1)
Sodium: 136 mmol/L (ref 135–145)

## 2021-10-31 LAB — D-DIMER, QUANTITATIVE: D-Dimer, Quant: 2.42 ug/mL-FEU — ABNORMAL HIGH (ref 0.00–0.50)

## 2021-10-31 LAB — CBC
HCT: 26.9 % — ABNORMAL LOW (ref 36.0–46.0)
Hemoglobin: 8.6 g/dL — ABNORMAL LOW (ref 12.0–15.0)
MCH: 31 pg (ref 26.0–34.0)
MCHC: 32 g/dL (ref 30.0–36.0)
MCV: 97.1 fL (ref 80.0–100.0)
Platelets: 182 10*3/uL (ref 150–400)
RBC: 2.77 MIL/uL — ABNORMAL LOW (ref 3.87–5.11)
RDW: 16.3 % — ABNORMAL HIGH (ref 11.5–15.5)
WBC: 9 10*3/uL (ref 4.0–10.5)
nRBC: 0.4 % — ABNORMAL HIGH (ref 0.0–0.2)

## 2021-10-31 LAB — PHOSPHORUS: Phosphorus: 3.1 mg/dL (ref 2.5–4.6)

## 2021-10-31 LAB — TRIGLYCERIDES: Triglycerides: 270 mg/dL — ABNORMAL HIGH (ref <150)

## 2021-10-31 LAB — MAGNESIUM: Magnesium: 2.3 mg/dL (ref 1.7–2.4)

## 2021-10-31 LAB — PREPARE RBC (CROSSMATCH)

## 2021-10-31 LAB — BRAIN NATRIURETIC PEPTIDE: B Natriuretic Peptide: 1003.6 pg/mL — ABNORMAL HIGH (ref 0.0–100.0)

## 2021-10-31 MED ORDER — REVEFENACIN 175 MCG/3ML IN SOLN
175.0000 ug | Freq: Every day | RESPIRATORY_TRACT | Status: DC
  Administered 2021-10-31 – 2021-11-01 (×2): 175 ug via RESPIRATORY_TRACT
  Filled 2021-10-31 (×2): qty 3

## 2021-10-31 MED ORDER — FUROSEMIDE 10 MG/ML IJ SOLN
40.0000 mg | Freq: Once | INTRAMUSCULAR | Status: AC
  Administered 2021-10-31: 40 mg via INTRAVENOUS
  Filled 2021-10-31: qty 4

## 2021-10-31 MED ORDER — ARFORMOTEROL TARTRATE 15 MCG/2ML IN NEBU
15.0000 ug | INHALATION_SOLUTION | Freq: Two times a day (BID) | RESPIRATORY_TRACT | Status: DC
  Administered 2021-10-31 – 2021-11-01 (×3): 15 ug via RESPIRATORY_TRACT
  Filled 2021-10-31 (×4): qty 2

## 2021-10-31 MED ORDER — ALBUTEROL SULFATE (2.5 MG/3ML) 0.083% IN NEBU
2.5000 mg | INHALATION_SOLUTION | Freq: Four times a day (QID) | RESPIRATORY_TRACT | Status: DC | PRN
Start: 2021-10-31 — End: 2021-11-02

## 2021-10-31 MED ORDER — PIPERACILLIN-TAZOBACTAM 3.375 G IVPB
3.3750 g | Freq: Three times a day (TID) | INTRAVENOUS | Status: DC
  Administered 2021-10-31 – 2021-11-01 (×4): 3.375 g via INTRAVENOUS
  Filled 2021-10-31 (×4): qty 50

## 2021-10-31 MED ORDER — TRAVASOL 10 % IV SOLN
INTRAVENOUS | Status: DC
  Filled 2021-10-31: qty 1080

## 2021-10-31 NOTE — Progress Notes (Signed)
Patient transported to ICU on BiPAP. Settings listed below. Report given to ICU RRT. Patient left on BiPAP. RN in room.   10/31/21 0234  BiPAP/CPAP/SIPAP  BiPAP/CPAP/SIPAP Pt Type Adult  Mask Type Full face mask  Mask Size Medium  Set Rate 16 breaths/min  Respiratory Rate 25 breaths/min  IPAP 16 cmH20  EPAP 8 cmH2O  Oxygen Percent 50 %  Minute Ventilation 11.1  Leak 0  Peak Inspiratory Pressure (PIP) 15  Tidal Volume (Vt) 522  BiPAP/CPAP/SIPAP BiPAP  Patient Home Equipment No  Auto Titrate No  Press High Alarm 25 cmH2O  Press Low Alarm 5 cmH2O  BiPAP/CPAP /SiPAP Vitals  Pulse Rate (!) 105  Resp (!) 25  SpO2 99 %  MEWS Score/Color  MEWS Score 3  MEWS Score Color Yellow

## 2021-10-31 NOTE — Progress Notes (Signed)
PT Cancellation Note  Patient Details Name: Jenna [name] MRN: [id number] DOB: 1941-03-07   Cancelled Treatment:    Reason Eval/Treat Not Completed: Other (comment) Pt noted to have transferred to a higher level of care & pt requiring fluctuating levels of O2. Per protocol, will d/c current orders & await new ones once pt more medically stable.  Lavone Nian, PT, DPT 10/31/21, 8:19 AM   Waunita Schooner 10/31/2021, 8:16 AM

## 2021-10-31 NOTE — Consult Note (Signed)
NAME:  Jenna [name], MRN:  [id number], DOB:  1940/11/30, LOS: 10 ADMISSION DATE:  10/05/2021, CONSULTATION DATE: 10/31/2021 REFERRING MD: Emeterio Reeve, DO, CHIEF COMPLAINT: Acute respiratory failure on BiPAP  History of Present Illness:  Ms. Asher is a 81 year old smoker, with a history of colon cancer with initial diagnosis on 30 November 2020 when she was noted to have an obstructive lesion.  She was staged at that time as a stage IIa and was treated with surgery and at that time margins and lymph nodes were clean.  The patient underwent follow-up and cancer staging and in February 2023 was diagnosed with stage IVb carcinoma with imaging showing liver lesions and findings suspicious for pulmonary metastasis.  She was placed on FOLFOX q. 14 days therapy.  She also had been complaining of shortness of breath and issues with intermittent atrial fibrillation that were being worked up by cardiology.  Ended on 20 July for ostomy takedown ventral hernia repair and repair of the abdominal wall.  Patient had defects from her prior surgery in 2022.  Per the patient's family she was somewhat debilitated even prior to surgery.  Postoperatively she had several issues including atrial fibrillation, suspected pneumonia, increasing respiratory distress, ileus and currently has to be fed via TPN.  Last night the patient developed worsening respiratory distress and required BiPAP.  She was transferred to the stepdown unit for management.  The patient was noted to have significant fluid overload with BNP over 1000.  Patient was also noted to be anemic, she was transfused, she has received Lasix with good diuresis but no significant improvement in her respiratory status.  PCCM has been consulted to try to optimize status.  Discussion with the patient's husband at bedside reveals that the patient has advanced directives and would not want to be intubated and desires DNR status.  There seems to be consensus in the  family in this regard.  Pertinent  Medical History  Metastatic colon cancer stage Ivb Paroxysmal atrial fibrillation Prior CVA  Significant Hospital Events: Including procedures, antibiotic start and stop dates in addition to other pertinent events   10/30/2021: Admitted to stepdown, BiPAP initiated  Interim History / Subjective:  Patient is currently on BiPAP, uncomfortable with the BiPAP, wishes to have it off however developed hypoxia even on high flow.  Patient very debilitated.  On discussion with regards to advanced directives agrees with husband who is at bedside.  Objective   Blood pressure 101/66, pulse (!) 104, temperature 98.1 F (36.7 C), temperature source Axillary, resp. rate 17, height 5' 4"  (1.626 m), weight 74.6 kg, SpO2 100 %.    FiO2 (%):  [50 %-100 %] 50 %   Intake/Output Summary (Last 24 hours) at 10/31/2021 1103 Last data filed at 10/31/2021 0800 Gross per 24 hour  Intake 1712.3 ml  Output 1800 ml  Net -87.7 ml   Filed Weights   10/12/2021 0807 10/29/21 1655  Weight: 71.7 kg 74.6 kg    Examination: GENERAL: Elderly woman, chronically ill and frail appearing, poor cough mechanics, on BiPAP HEAD: Normocephalic, atraumatic.  EYES: Pupils equal, round, reactive to light.  No scleral icterus.  MOUTH: Dry oral mucosa. NECK: Supple. No thyromegaly. Trachea midline. No JVD.  No adenopathy. PULMONARY: Good air entry bilaterally on BiPAP.  Diffuse rhonchi throughout, occasional wheezes. CARDIOVASCULAR: S1 and S2.  Tachycardic ABDOMEN: Soft slightly distended some tenderness to palpation, midline incision with staples, ileostomy wound healing.  JP drain present with serosanguineous fluid. MUSCULOSKELETAL: No joint deformity,  no clubbing, no edema.  NEUROLOGIC: Lethargic but arousable, very debilitated.  Overt localizing sign. SKIN: Intact,warm,dry.   Resolved Hospital Problem list     Assessment & Plan:  Acute respiratory failure in the setting of the volume  overload, HFpEF exacerbation Cannot exclude potential pneumonia Poor cough mechanics Diurese as tolerated Try to wean off of BiPAP to high flow O2 maintain sats of 88% or better Pulmonary hygiene Bronchodilator therapy Patient is DNI/DNR if condition worsens transition to comfort measures  Possible pneumonia On empiric antibiotics, continue Chest physiotherapy if tolerates (MetaNeb off BiPAP)  Anemia acute on chronic Acute blood loss anemia superimposed on chronic anemia Transfused, last night Monitor and transfuse if hemoglobin less than 7  Acute kidney injury superimposed on stage IIIa CKD Stable to improved Avoid nephrotoxins as able  Status post colostomy takedown Postoperative ileus Patient n.p.o. Nutrition via TPN Monitor electrolytes and correct as needed  Metastatic colon cancer stage IVb This issue adds complexity to her management Oncology palliative care consultation  Nutrition Status: Nutrition Problem: Increased nutrient needs Etiology: post-op healing Signs/Symptoms: estimated needs Interventions: TPN   Best Practice (right click and "Reselect all SmartList Selections" daily)   Diet/type: TPN DVT prophylaxis: systemic dose LMWH GI prophylaxis: PPI Lines: Central line (PICC line double-lumen, right basilic, Port-A-Cath, right chest) Foley:  N/A pure wick in place Code Status:  DNR Last date of multidisciplinary goals of care discussion [7/30]  Discussed with patient's husband, and daughter at bedside, reiterated patient has advanced directives.  Patient to be DNR.  Patient's husband is interested in palliative care consultation.  Labs   CBC: Recent Labs  Lab 10/28/21 0525 10/29/21 0930 10/30/21 0610 10/30/21 2127 10/31/21 0435  WBC 8.8 12.6* 12.3* 12.6* 9.0  NEUTROABS  --   --   --  11.2*  --   HGB 8.0* 7.6* 7.3* 7.7* 8.6*  HCT 24.6* 23.8* 22.7* 23.9* 26.9*  MCV 96.9 96.4 96.6 97.2 97.1  PLT 171 186 181 197 287    Basic Metabolic  Panel: Recent Labs  Lab 10/28/21 0525 10/29/21 0930 10/30/21 0610 10/30/21 2127 10/31/21 0435  NA 134* 135 133* 133* 136  K 3.5 4.1 4.3 4.5 4.4  CL 107 107 107 108 108  CO2 19* 15* 18* 18* 21*  GLUCOSE 100* 107* 148* 204* 196*  BUN 34* 32* 37* 42* 44*  CREATININE 1.30* 1.36* 1.25* 1.12* 1.12*  CALCIUM 8.6* 8.7* 8.5* 8.4* 8.3*  MG 1.9 2.0 2.2  --  2.3  PHOS 4.7* 5.6* 4.9*  --  3.1   GFR: Estimated Creatinine Clearance: 41 mL/min (A) (by C-G formula based on SCr of 1.12 mg/dL (H)). Recent Labs  Lab 10/29/21 0930 10/30/21 0610 10/30/21 2127 10/31/21 0435  PROCALCITON  --   --  1.59  --   WBC 12.6* 12.3* 12.6* 9.0  LATICACIDVEN  --   --  1.1  --     Liver Function Tests: Recent Labs  Lab 10/25/21 0410 10/29/21 0930 10/30/21 0610  AST 12* 14* 13*  ALT 9 9 9   ALKPHOS 49 62 58  BILITOT 0.6 1.2 0.8  PROT 5.5* 5.2* 5.5*  ALBUMIN 2.6* 2.1* 2.4*   No results for input(s): "LIPASE", "AMYLASE" in the last 168 hours. No results for input(s): "AMMONIA" in the last 168 hours.  ABG    Component Value Date/Time   PHART 7.35 10/31/2021 1025   PCO2ART 39 10/31/2021 1025   PO2ART 109 (H) 10/31/2021 1025   HCO3 21.5 10/31/2021 1025  ACIDBASEDEF 3.8 (H) 10/31/2021 1025   O2SAT 99.7 10/31/2021 1025    BNP    Component Value Date/Time   BNP 1,003.6 (H) 10/31/2021 0500    BNP (last 3 results) Recent Labs    10/30/21 2127 10/31/21 0500  BNP 1,170.7* 1,003.6*    Coagulation Profile: Recent Labs  Lab 10/25/21 0410 10/29/21 0930  INR 1.3* 1.2    Cardiac Enzymes: No results for input(s): "CKTOTAL", "CKMB", "CKMBINDEX", "TROPONINI" in the last 168 hours.  HbA1C: Hgb A1c MFr Bld  Date/Time Value Ref Range Status  05/13/2019 04:49 AM 5.6 4.8 - 5.6 % Final    Comment:    (NOTE)         Prediabetes: 5.7 - 6.4         Diabetes: >6.4         Glycemic control for adults with diabetes: <7.0     CBG: Recent Labs  Lab 10/30/21 1647 10/30/21 2050  10/31/21 0055 10/31/21 0246 10/31/21 0722  GLUCAP 151* 193* 221* 199* 159*    Review of Systems:   Cannot be obtained due to acute critical illness and respiratory distress on BiPAP.  Past Medical History:  She,  has a past medical history of BRCA negative (03/2020), Colon cancer [facility]), Family history of breast cancer, Gout, Hyperlipidemia, Hypertension, Scarlet fever, Stroke ([place]) (05/2019), and Thyroid disease.   Surgical History:   Past Surgical History:  Procedure Laterality Date   ABDOMINAL WALL DEFECT REPAIR N/A 10/31/2021   Procedure: REPAIR ABDOMINAL WALL;  Surgeon: Jules Husbands, MD;  Location: [facility] ORS;  Service: General;  Laterality: N/A;   APPLICATION OF WOUND VAC  11/16/2020   Procedure: APPLICATION OF WOUND VAC;  Surgeon: Fredirick Maudlin, MD;  Location: [facility] ORS;  Service: General;;  Serial # [id number]   BREAST BIOPSY Right 1978   neg   CHOLECYSTECTOMY     COLECTOMY WITH COLOSTOMY CREATION/HARTMANN PROCEDURE N/A 11/16/2020   Procedure: SUBTOTAL COLECTOMY WITH ILEOSTOMY CREATION;  Surgeon: Fredirick Maudlin, MD;  Location: [facility] ORS;  Service: General;  Laterality: N/A;   COLOSTOMY TAKEDOWN N/A 10/25/2021   Procedure: COLOSTOMY TAKEDOWN-RNFA to assist;  Surgeon: Jules Husbands, MD;  Location: [facility] ORS;  Service: General;  Laterality: N/A;  provider is requesting 4 hours total for these procedures   FLEXIBLE SIGMOIDOSCOPY N/A 09/09/2021   Procedure: FLEXIBLE SIGMOIDOSCOPY;  Surgeon: Lin Landsman, MD;  Location: [facility] ENDOSCOPY;  Service: Gastroenterology;  Laterality: N/A;   FLEXIBLE SIGMOIDOSCOPY N/A 09/10/2021   Procedure: FLEXIBLE SIGMOIDOSCOPY;  Surgeon: Lin Landsman, MD;  Location: [facility] ENDOSCOPY;  Service: Gastroenterology;  Laterality: N/A;   LAPAROTOMY N/A 11/16/2020   Procedure: EXPLORATORY LAPAROTOMY;  Surgeon: Fredirick Maudlin, MD;  Location: [facility] ORS;  Service: General;  Laterality: N/A;   PORTACATH PLACEMENT N/A 06/03/2021   Procedure: INSERTION  PORT-A-CATH;  Surgeon: Jules Husbands, MD;  Location: [facility] ORS;  Service: General;  Laterality: N/A;  Provider requesting 1 hour / 60 minutes for procedure.   VENTRAL HERNIA REPAIR N/A 10/20/2021   Procedure: HERNIA REPAIR VENTRAL ADULT;  Surgeon: Jules Husbands, MD;  Location: [facility] ORS;  Service: General;  Laterality: N/A;     Social History:   reports that she has been smoking cigarettes. She has been smoking an average of .25 packs per day. She has never used smokeless tobacco. She reports that she does not drink alcohol and does not use drugs.   Family History:  Her family history includes Breast cancer (age of onset: 71) in  her cousin; Breast cancer (age of onset: 49) in her maternal grandmother.   Allergies No Known Allergies   Home Medications  Prior to Admission medications   Medication Sig Start Date End Date Taking? Authorizing Provider  allopurinol (ZYLOPRIM) 100 MG tablet Take 100 mg by mouth every evening.   Yes [provider]  aspirin EC 81 MG tablet Take 81 mg by mouth every evening.   Yes [provider]  Calcium Carb-Cholecalciferol (CALCIUM 600+D) 600-20 MG-MCG TABS Take 1 tablet by mouth daily.   Yes [provider]  levothyroxine (SYNTHROID) 50 MCG tablet Take 50 mcg by mouth daily. 04/30/19  Yes [provider]  rosuvastatin (CRESTOR) 40 MG tablet Take 40 mg by mouth every evening.   Yes [provider]  lidocaine-prilocaine (EMLA) cream Apply small amount over port site 2 hours prior to it being accessed and cover with plastic each time port is to be accessed 06/03/21   Cammie Sickle, MD     Scheduled Meds:  arformoterol  15 mcg Nebulization BID   Chlorhexidine Gluconate Cloth  6 each Topical Daily   enoxaparin (LOVENOX) injection  1.25 mg/kg Subcutaneous Q12H   insulin aspart  0-6 Units Subcutaneous Q4H   pantoprazole (PROTONIX) IV  40 mg Intravenous Q12H   revefenacin  175 mcg Nebulization Daily   sodium  chloride flush  10-40 mL Intracatheter Q12H   sodium chloride flush  10-40 mL Intracatheter Q12H   Continuous Infusions:  sodium chloride Stopped (10/23/21 0003)   azithromycin Stopped (10/30/21 2253)   methocarbamol (ROBAXIN) IV     piperacillin-tazobactam (ZOSYN)  IV 3.375 g (10/31/21 1000)   TPN ADULT (ION) 75 mL/hr at 10/31/21 0800   TPN ADULT (ION)     PRN Meds:.sodium chloride, albuterol, [DISCONTINUED] diphenhydrAMINE **OR** diphenhydrAMINE, iohexol, [DISCONTINUED] methocarbamol **OR** methocarbamol (ROBAXIN) IV, metoprolol tartrate, morphine injection, ondansetron **OR** ondansetron (ZOFRAN) IV, [DISCONTINUED] prochlorperazine **OR** prochlorperazine, sodium chloride flush, sodium chloride flush   Critical care time: 60     C. Derrill Kay, MD Advanced Bronchoscopy PCCM [facility]    *This note was dictated using voice recognition software/Dragon.  Despite best efforts to proofread, errors can occur which can change the meaning. Any transcriptional errors that result from this process are unintentional and may not be fully corrected at the time of dictation.

## 2021-10-31 NOTE — Progress Notes (Signed)
10/31/2021  Subjective: Patient is 10 Days Post-Op status post ileostomy reversal with abdominal wall reconstruction.  Patient was transferred to the ICU overnight due to worsening respiratory distress.  Labs showed a white blood cell count of 12.6, hemoglobin 7.7, creatinine 1.12, BNP 1170.7, lactic acid 1.1.  She was alternate between BiPAP and high flow nasal cannula.  This morning, under BiPAP, her ABG is much improved with normal pH, improved CO2, improved PO2.  She received a unit of red blood cell transfusion last night with 2 doses of 40 mg IV Lasix.  She has diuresed very well and repeat BNP this morning was 1003.6.  Otherwise still no bowel function and continues with NG tube in place with TPN.  Vital signs: Temp:  [97.5 F (36.4 C)-98.5 F (36.9 C)] 98.1 F (36.7 C) (07/30 0800) Pulse Rate:  [27-125] 100 (07/30 1300) Resp:  [10-34] 15 (07/30 1300) BP: (96-116)/(59-81) 113/59 (07/30 1300) SpO2:  [83 %-100 %] 96 % (07/30 1300) FiO2 (%):  [50 %-100 %] 50 % (07/30 1131)   Intake/Output: 07/29 0701 - 07/30 0700 In: 384 [Blood:384] Out: 1600 [Urine:1600] Last BM Date : 10/24/21  Physical Exam: Constitutional: No acute distress, somewhat sleepy Cardiac: Low-grade tachycardia Pulm: Currently now on high flow nasal cannula Abdomen: Soft, nondistended, with mild soreness to palpation.  Midline incision is stable with staples and is clean dry and intact.  And ileostomy wound is healing appropriately also clean dry and intact.  Drain with serosanguineous fluid.  Labs:  Recent Labs    10/30/21 2127 10/31/21 0435  WBC 12.6* 9.0  HGB 7.7* 8.6*  HCT 23.9* 26.9*  PLT 197 182   Recent Labs    10/30/21 2127 10/31/21 0435  NA 133* 136  K 4.5 4.4  CL 108 108  CO2 18* 21*  GLUCOSE 204* 196*  BUN 42* 44*  CREATININE 1.12* 1.12*  CALCIUM 8.4* 8.3*   Recent Labs    10/29/21 0930  LABPROT 15.2  INR 1.2    Imaging: DG Chest 1 View  Result Date: 10/30/2021 CLINICAL DATA:   Hypoxia. EXAM: CHEST  1 VIEW COMPARISON:  Chest radiograph dated 10/29/2021. FINDINGS: Support lines and tubes in similar position. Interval worsening of airspace opacities involving the mid to lower lung field, right greater left, compared to prior radiograph. Findings likely represent increased pleural effusion and associated atelectasis or infiltrate. No pneumothorax. Stable cardiomegaly. Atherosclerotic calcification of the aorta. Degenerative changes of the spine. No acute osseous pathology. IMPRESSION: Worsening airspace opacities involving the mid to lower lung fields, right greater left, likely representing increased pleural effusion and associated atelectasis or infiltrate. Electronically Signed   By: Anner Crete M.D.   On: 10/30/2021 21:27    Assessment/Plan: This is a 81 y.o. female s/p ileostomy reversal with abdominal wall reconstruction.  - Was transferred to ICU last night due to hypoxemic respiratory failure.  She is currently better and then transition off BiPAP onto high flow nasal cannula.  Patient understands that she may need BiPAP again if her numbers deteriorate.  ICU team will be working today on more aggressive pulmonary toileting diuresing. - From abdominal standpoint, continue NG tube to suction, continue TPN for nutrition while awaiting return of bowel function.  Wounds are healing appropriately without any evidence of infection. - Continue antibiotics for her pneumonia.   Melvyn Neth, [facility]

## 2021-10-31 NOTE — Consult Note (Signed)
PHARMACY - TOTAL PARENTERAL NUTRITION CONSULT NOTE   Indication: Prolonged ileus  Patient Measurements: Height: 5\' 4"  (162.6 cm) Weight: 74.6 kg (164 lb 7.4 oz) IBW/kg (Calculated) : 54.7 TPN AdjBW (KG): 58.9 Body mass index is 28.23 kg/m. Usual Weight: 71.7kg  Assessment:  [name] w/ h/o colon cancer (adenocarcinoma with large bowel obstruction requiring emergent laparotomy and subtotal colectomy in August 2022) on 7/28 now POD9 from an ileostomy takedown complicated by a post-op illeus and N/V 7/27 midday. Pharmacy consulted for TPN and electrolyte mgmt.  Glucose / Insulin: (goal gluc <180; current 100-160s) Electrolytes:  K: 4.4 Mg: 2.3 Phos: 3.1 Renal: Scr 1.12 (baseline<1) Hepatic: LFTs WNL; Albumin 2.6 Intake / Output; MIVF:  on LR @100ml /h, net(+7.4L) GI Imaging: 7/26 CT Abd - Mild-to-moderate dilation of small-bowel loops suggesting ileus. 7/26 KUB - Mild gaseous distention of small bowel similar to prior study, likely ileus in the postoperative setting. Recent postoperative changes in the abdomen and pelvis. 7/27  CT abd/pelvis w/ Con - Interval ileostomy takedown. Diffusely dilated small bowel, findings are compatible with ileus.  GI Surgeries / Procedures:  August 2022 - large bowel obstruction requiring emergent laparotomy and subtotal colectomy October 21 2021 - Ileostomy take down.  Central access:  TPN start date: 10/29/2021  Nutritional Goals: Goal TPN rate is 75 mL/hr (provides 108 g of protein and 1832 kcals per day)  RD Assessment: Estimated Needs Total Energy Estimated Needs: 1700-1900 Total Protein Estimated Needs: 95-110 grams Total Fluid Estimated Needs: > 1.7 L  Current Nutrition:  TPN  Plan:  Increase TPN to goal rate 85mL/hr at 1800. Will supply: 1832 kcals; 108g protein, ~1811ml fluid  NOTE: Dex was reduced 7/28 from 15>10% to compensate for overfill with volume of bag. Please increase back to 15% 7/29 once at goal.  Electrolytes in TPN: Na  14mEq/L, K 70mEq/L, Ca 32mEq/L, Mg 61mEq/L, and Phos 93mmol/L. Cl:Ac 1:1 Add standard MVI and trace elements to TPN Will add Thiamine 100mg  (d3/3d) Initiate Sensitive q4h SSI and adjust as needed  Reduce MIVF to 50 mL/hr at 1800 Monitor TPN labs on Mon/Thurs, Will get additional set of labs in AM to assess new start.  Tam Savoia A Naileah Karg 10/31/2021,10:06 AM

## 2021-10-31 NOTE — Progress Notes (Signed)
PROGRESS NOTE    Jenna [name]  IEP:[id number] DOB: 1940/11/23  DOA: 10/29/2021 Date of Service: 10/31/21 PCP: [facility]     Brief Narrative / Hospital Course:   81 year old female with past medical history of colon cancer (adenocarcinoma with large bowel obstruction requiring emergent laparotomy and subtotal colectomy in August 2022), hypothyroidism and hyperlipidemia admitted on 7/20 by general surgery for colostomy reversal and abdominal wall reconstruction who developed new onset paroxysmal atrial fibrillation on 7/22.  Recent PET scan discovered metastases to liver and lung.   CARDIAC: Hospitalists were consulted for management of postoperative atrial fibrillation.  Patient started on IV amiodarone drip. Transitioned off and rate has been controlled. Cardiology consulted given hypotensive. Started low dose metoprolol. BP remained low, pt asymptomatic but not ambulating much. Have had to hold metoprolol at tiems d/t hypotension. Echo this admission EF 55-60, normal LV function, diastolic parameters indeterminate, normal RV fxn. 07/26: switching metoprolol to 12.5 mg bid and transition from lovenox to eliquis 5 mg bid at discretion of surgeon and hospitalist - cardiology has signed off. 07/27: BP low but stable, was hypotensive overnight which responded to IV fluids. 07/28-07/29: HR improving, BP stable. 07/29 PM: SOB and CXR concerning for worsening cardiomegaly (vs rotation?) and pt started on Lasix continued into 07/30.  RESPIRATORY / ID: Repeated CXR 07/24 to eval PNA given coughing: RLL findings appear progressed compared to previous CXR, initiated CAP treatment. Pt had been stable on room air, cough improving, using her incentive spirometer. New O2 requirement but she was declining CXR 07/29 "tired of Xrays," in evening O2 became dislodged and pt desaturated requiring BiPap, CXR at that time noted new cardiomegaly (vs rotation?), pt was transfer to SDU w/ blood  transfusion and lasix x2 doses ordered, ABG improved on BiPap, pt has been on and off BiPap, encouraged compliance w this and pulmonary is following along  SURGICAL / GI: Concern for ileus 07/26 but no evidence of serious complications. 07/27: no BM x4 days, (+)nausea mid-day progressed to vomiting. Placed NG tube for concern for obstruction, gen surg (Dr Dahlia Byes) following, NG helped bloating and symptoms. 07/28: TPN and PICC. CT showed ileus no obstruction or other intraabdominal concerns.  Progression goals: resolution/improvement of postoperative ileus, pending placement hoping to get inpatient rehab approved but anticipate will be here at least through mid next week pending medical stabilization.    Subjective: Patient reports feeling okay today, cough is improving, some nausea in mid-day per RN report, no CP/SOB. Small BM 4 days ago. Pain is controlled.      ASSESSMENT & PLAN:   Principal Problem:   S/P colostomy takedown Active Problems:   Paroxysmal atrial fibrillation with RVR (HCC)   Acute kidney injury superimposed on stage IIIa CKD ([place])   Metastatic colon cancer to liver [facility])   Essential hypertension   Hyperlipidemia   Acquired hypothyroidism   Overweight (BMI 25.0-29.9)   Atrial fibrillation (HCC)  CARDIAC: Paroxysmal atrial fibrillation with RVR ([place]) - RESOLVED Likely HFpEF exacerbation now, suspect fluid overload d/t TPN CHADS2VASC 6.  Echocardiogram done 7/23, preserved EF, unable to assess diastolic function. Essential hypertension, now intermittently hypotensive Orthostatic hypotensive Anticoagulation: currently heparin. Until she is further out from surgery, would not start full dose anticoagulation,but plan for ELiquis.  Rate control: Off amio/dilt d/t hypotension. Cardiology started low-dose metoprolol and patient has tolerated this well but BP remains soft.  Now on IV since is n.p.o. with NG in place.  Given change in condition, have reached out  to cardiology as  FYI in case need to reengage: advised Lopressor 5 mg IV every 6 hours as needed for rate over 453 hold for systolic pressure less than 100 CHF: diuresing   RESPIRATORY Abnormal CXR concerning for RLL pneumonia Community Acquired Pneumonia Smoker - interested in quitting Pulmonary following  Pt declined CXR yesterday but resp status decompensated, CXR yesterday evening appears CHF related, see above   Antibiotics: Started Abx 10/25/21 w/ augmentin + azithro --> ID now that NG is in place, continue IV azithromycin and Zosyn. Initial plan was to d/c today but will continue for now given respiratory deterioration but suspect this more likely d/t CHF   Bronchodilation: duonebs O2 support: HFNC / BiPap Other: treating CHF / diuresing   GASTROINTESTINAL/SURGICAL S/P colostomy takedown Postoperative ileus Metastatic colon cancer to liver [facility]) Management as per surgery. NG is in place PICC line and TPN 10/29/2021 CT abd/pelv no obstruction or other sign complication   ENDOCRINE Acquired hypothyroidism Continue Synthroid  RENAL Acute kidney injury superimposed on stage IIIa CKD ([place]) Stable/improving Lab Results  Component Value Date   CREATININE 1.12 (H) 10/31/2021   CREATININE 1.12 (H) 10/30/2021   CREATININE 1.25 (H) 10/30/2021    Hyperlipidemia Holding po statin  Overweight (BMI 25.0-29.9) Meets criteria for BMI greater than 25   DVT prophylaxis: SCD and Lovenox Code Status: FULL Family Communication: daughter at bedside, also met her husband today  Dispo: eval for inpatient rehab is ongoing but remains inpatient for now               Objective: Vitals:   10/31/21 1131 10/31/21 1200 10/31/21 1300 10/31/21 1400  BP: 115/75 109/60 (!) 113/59 106/63  Pulse: (!) 114 (!) 27 100 (!) 107  Resp: 15 18 15  (!) 22  Temp:      TempSrc:      SpO2: 99% (!) 84% 96% 100%  Weight:      Height:        Intake/Output Summary (Last 24 hours) at 10/31/2021 1519 Last  data filed at 10/31/2021 1200 Gross per 24 hour  Intake 2037.21 ml  Output 1800 ml  Net 237.21 ml   Filed Weights   10/22/2021 0807 10/29/21 1655  Weight: 71.7 kg 74.6 kg    Examination:  Constitutional:  VS as above General Appearance: alert, well-developed, well-nourished, NAD Eyes: Normal lids and conjunctive, non-icteric sclera Ears, Nose, Mouth, Throat: Dry MM NG in place  Neck: No masses, trachea midline Respiratory: Normal respiratory effort Breath sounds coarse all fields  Cardiovascular: S1/S2 normal, irreg irreg No lower extremity edema Gastrointestinal: Nontender, avoided deep palpation near surgical sites  Bowel sounds faint  Musculoskeletal:  No clubbing/cyanosis of digits Neurological: No cranial nerve deficit on limited exam Psychiatric: Normal judgment/insight Anxious mood and affect       Scheduled Medications:   arformoterol  15 mcg Nebulization BID   Chlorhexidine Gluconate Cloth  6 each Topical Daily   enoxaparin (LOVENOX) injection  1.25 mg/kg Subcutaneous Q12H   insulin aspart  0-6 Units Subcutaneous Q4H   pantoprazole (PROTONIX) IV  40 mg Intravenous Q12H   revefenacin  175 mcg Nebulization Daily   sodium chloride flush  10-40 mL Intracatheter Q12H   sodium chloride flush  10-40 mL Intracatheter Q12H    Continuous Infusions:  sodium chloride Stopped (10/23/21 0003)   azithromycin Stopped (10/30/21 2253)   methocarbamol (ROBAXIN) IV     piperacillin-tazobactam (ZOSYN)  IV 12.5 mL/hr at 10/31/21 1200   TPN ADULT (ION)  75 mL/hr at 10/31/21 1200   TPN ADULT (ION)      PRN Medications:  sodium chloride, albuterol, [DISCONTINUED] diphenhydrAMINE **OR** diphenhydrAMINE, iohexol, [DISCONTINUED] methocarbamol **OR** methocarbamol (ROBAXIN) IV, metoprolol tartrate, morphine injection, ondansetron **OR** ondansetron (ZOFRAN) IV, [DISCONTINUED] prochlorperazine **OR** prochlorperazine, sodium chloride flush, sodium chloride  flush  Antimicrobials:  Anti-infectives (From admission, onward)    Start     Dose/Rate Route Frequency Ordered Stop   10/31/21 0915  piperacillin-tazobactam (ZOSYN) IVPB 3.375 g        3.375 g 12.5 mL/hr over 240 Minutes Intravenous Every 8 hours 10/31/21 0822     10/29/21 2100  azithromycin (ZITHROMAX) 500 mg in sodium chloride 0.9 % 250 mL IVPB        500 mg 250 mL/hr over 60 Minutes Intravenous Every 24 hours 10/29/21 0949     10/29/21 1045  piperacillin-tazobactam (ZOSYN) IVPB 3.375 g  Status:  Discontinued        3.375 g 12.5 mL/hr over 240 Minutes Intravenous Every 12 hours 10/29/21 0949 10/31/21 0822   10/27/21 1045  amoxicillin-clavulanate (AUGMENTIN) 875-125 MG per tablet 1 tablet  Status:  Discontinued        1 tablet Oral Every 12 hours 10/27/21 0959 10/29/21 0949   10/26/21 1000  azithromycin (ZITHROMAX) tablet 250 mg  Status:  Discontinued        250 mg Oral Daily 10/25/21 1357 10/29/21 0949   10/25/21 1445  amoxicillin-clavulanate (AUGMENTIN) 500-125 MG per tablet 500 mg  Status:  Discontinued        1 tablet Oral Every 12 hours 10/25/21 1357 10/27/21 0959   10/25/21 1445  azithromycin (ZITHROMAX) tablet 500 mg        500 mg Oral  Once 10/25/21 1357 10/25/21 1544   10/23/2021 2200  cefoTEtan (CEFOTAN) 2 g in sodium chloride 0.9 % 100 mL IVPB        2 g 200 mL/hr over 30 Minutes Intravenous Every 8 hours 10/17/2021 1739 10/22/21 0639   10/04/2021 1455  sodium chloride 0.9 % with cefoTEtan (CEFOTAN) ADS Med       Note to Pharmacy: Register, Santiago Glad A: cabinet override      10/27/2021 1455 10/13/2021 1549   10/13/2021 0816  sodium chloride 0.9 % with cefoTEtan (CEFOTAN) ADS Med       Note to Pharmacy: Herby Abraham W: cabinet override      10/06/2021 0816 10/30/2021 1139   10/28/2021 0730  cefoTEtan (CEFOTAN) 2 g in sodium chloride 0.9 % 100 mL IVPB        2 g 200 mL/hr over 30 Minutes Intravenous On call to O.R. 10/25/2021 0728 10/22/2021 1527       Data Reviewed: I have personally  reviewed following labs and imaging studies  CBC: Recent Labs  Lab 10/28/21 0525 10/29/21 0930 10/30/21 0610 10/30/21 2127 10/31/21 0435  WBC 8.8 12.6* 12.3* 12.6* 9.0  NEUTROABS  --   --   --  11.2*  --   HGB 8.0* 7.6* 7.3* 7.7* 8.6*  HCT 24.6* 23.8* 22.7* 23.9* 26.9*  MCV 96.9 96.4 96.6 97.2 97.1  PLT 171 186 181 197 612   Basic Metabolic Panel: Recent Labs  Lab 10/28/21 0525 10/29/21 0930 10/30/21 0610 10/30/21 2127 10/31/21 0435  NA 134* 135 133* 133* 136  K 3.5 4.1 4.3 4.5 4.4  CL 107 107 107 108 108  CO2 19* 15* 18* 18* 21*  GLUCOSE 100* 107* 148* 204* 196*  BUN 34* 32* 37* 42* 44*  CREATININE 1.30* 1.36* 1.25* 1.12* 1.12*  CALCIUM 8.6* 8.7* 8.5* 8.4* 8.3*  MG 1.9 2.0 2.2  --  2.3  PHOS 4.7* 5.6* 4.9*  --  3.1   GFR: Estimated Creatinine Clearance: 41 mL/min (A) (by C-G formula based on SCr of 1.12 mg/dL (H)). Liver Function Tests: Recent Labs  Lab 10/25/21 0410 10/29/21 0930 10/30/21 0610  AST 12* 14* 13*  ALT 9 9 9   ALKPHOS 49 62 58  BILITOT 0.6 1.2 0.8  PROT 5.5* 5.2* 5.5*  ALBUMIN 2.6* 2.1* 2.4*   No results for input(s): "LIPASE", "AMYLASE" in the last 168 hours. No results for input(s): "AMMONIA" in the last 168 hours. Coagulation Profile: Recent Labs  Lab 10/25/21 0410 10/29/21 0930  INR 1.3* 1.2   Cardiac Enzymes: No results for input(s): "CKTOTAL", "CKMB", "CKMBINDEX", "TROPONINI" in the last 168 hours. BNP (last 3 results) No results for input(s): "PROBNP" in the last 8760 hours. HbA1C: No results for input(s): "HGBA1C" in the last 72 hours. CBG: Recent Labs  Lab 10/30/21 2050 10/31/21 0055 10/31/21 0246 10/31/21 0722 10/31/21 1159  GLUCAP 193* 221* 199* 159* 167*   Lipid Profile: Recent Labs    10/30/21 0610 10/31/21 0435  TRIG 191* 270*   Thyroid Function Tests: No results for input(s): "TSH", "T4TOTAL", "FREET4", "T3FREE", "THYROIDAB" in the last 72 hours. Anemia Panel: No results for input(s): "VITAMINB12",  "FOLATE", "FERRITIN", "TIBC", "IRON", "RETICCTPCT" in the last 72 hours. Urine analysis:    Component Value Date/Time   COLORURINE YELLOW (A) 11/14/2020 2000   APPEARANCEUR HAZY (A) 11/14/2020 2000   LABSPEC 1.027 11/14/2020 2000   PHURINE 6.0 11/14/2020 2000   GLUCOSEU NEGATIVE 11/14/2020 2000   HGBUR NEGATIVE 11/14/2020 2000   BILIRUBINUR NEGATIVE 11/14/2020 2000   KETONESUR NEGATIVE 11/14/2020 2000   PROTEINUR NEGATIVE 11/14/2020 2000   NITRITE NEGATIVE 11/14/2020 2000   LEUKOCYTESUR MODERATE (A) 11/14/2020 2000   Sepsis Labs: @LABRCNTIP (procalcitonin:4,lacticidven:4)  No results found for this or any previous visit (from the past 240 hour(s)).       Radiology Studies last 96 hours: DG Chest 1 View  Result Date: 10/30/2021 CLINICAL DATA:  Hypoxia. EXAM: CHEST  1 VIEW COMPARISON:  Chest radiograph dated 10/29/2021. FINDINGS: Support lines and tubes in similar position. Interval worsening of airspace opacities involving the mid to lower lung field, right greater left, compared to prior radiograph. Findings likely represent increased pleural effusion and associated atelectasis or infiltrate. No pneumothorax. Stable cardiomegaly. Atherosclerotic calcification of the aorta. Degenerative changes of the spine. No acute osseous pathology. IMPRESSION: Worsening airspace opacities involving the mid to lower lung fields, right greater left, likely representing increased pleural effusion and associated atelectasis or infiltrate. Electronically Signed   By: Anner Crete M.D.   On: 10/30/2021 21:27   CT ABDOMEN PELVIS W CONTRAST  Result Date: 10/29/2021 CLINICAL DATA:  Abdominal pain, nonlocalized EXAM: CT ABDOMEN AND PELVIS WITH CONTRAST TECHNIQUE: Multidetector CT imaging of the abdomen and pelvis was performed using the standard protocol following bolus administration of intravenous contrast. RADIATION DOSE REDUCTION: This exam was performed according to the departmental dose-optimization  program which includes automated exposure control, adjustment of the mA and/or kV according to patient size and/or use of iterative reconstruction technique. CONTRAST:  160m OMNIPAQUE IOHEXOL 300 MG/ML  SOLN COMPARISON:  CT chest, abdomen and pelvis dated Aug 12, 2021. FINDINGS: Lower chest: Small bilateral pleural effusions and atelectasis. Hepatobiliary: Small lesion which was previously described in the anterior inferior right lobe of the liver is no  longer visualized. Suspicious liver lesions. Status post cholecystectomy. No biliary dilatation. Pancreas: Unremarkable. No pancreatic ductal dilatation or surrounding inflammatory changes. Spleen: Normal in size without focal abnormality. Adrenals/Urinary Tract: Bilateral adrenal glands are unremarkable. No hydronephrosis. Nonobstructing stone of the right kidney. Stable simple appearing cyst of the left kidney. Small locule of air is seen in the urinary bladder, correlate for history of recent instrumentation. No evidence of bladder wall thickening, however streak artifact from left hip arthroplasty somewhat limits evaluation. Stomach/Bowel: Interval ileostomy takedown. Diffusely dilated small bowel with a gradual transition of the site of the anastomosis. Normal appearance of the anastomosis. Small bowel feces sign seen just upstream of the anastomosis, compatible with delayed transit. No bowel wall thickening or evidence of pneumatosis. Contrast material is seen in the proximal small bowel loops which becomes diluted, no definite passage of contrast beyond the anastomosis. Diverticulosis of the sigmoid colon with retained contrast material seen in several diverticula. Vascular/Lymphatic: Aortic atherosclerosis. New prominent subcentimeter mesenteric lymph nodes seen in the right lower quadrant, reference node measuring 9 mm on series 2, image 57. No pathologically enlarged lymph nodes seen in the pelvis. Reproductive: No pelvic mass. Other: Trace abdominal  ascites and small locules of intraperitoneal free air, likely postsurgical. Postsurgical changes of the anterior right abdominal wall with surgical drain place. Musculoskeletal: Prior left total hip arthroplasty. No aggressive appearing osseous lesions. IMPRESSION: 1. Interval ileostomy takedown. Diffusely dilated small bowel, findings are compatible with ileus. 2. Small bilateral pleural effusions and atelectasis. 3. New prominent subcentimeter mesenteric lymph nodes are seen in the right lower quadrant, likely reactive. Recommend attention on follow-up per clinical protocol. 4. Small locule of air is seen in the urinary bladder, likely due to recent instrumentation. Infection is an additional consideration. Correlate with urinalysis. 5.  Aortic Atherosclerosis (ICD10-I70.0). Electronically Signed   By: Yetta Glassman M.D.   On: 10/29/2021 14:52   DG Chest Port 1 View  Result Date: 10/29/2021 CLINICAL DATA:  PICC line placement. EXAM: PORTABLE CHEST 1 VIEW COMPARISON:  Same day FINDINGS: Interval placement of right-sided PICC line with distal tip in expected position of the SVC. Right internal jugular Port-A-Cath is unchanged. Stable bibasilar atelectasis and effusions are noted. IMPRESSION: Interval placement of right-sided PICC line with distal tip in expected position of the SVC. Electronically Signed   By: Marijo Conception M.D.   On: 10/29/2021 14:09   [place] EKG SITE RITE  Result Date: 10/29/2021 If Site Rite image not attached, placement could not be confirmed due to current cardiac rhythm.  DG ABD ACUTE 2+V W 1V CHEST  Result Date: 10/29/2021 CLINICAL DATA:  Postoperative ileus.  Abdominal pain. EXAM: DG ABDOMEN ACUTE WITH 1 VIEW CHEST COMPARISON:  October 28, 2021. FINDINGS: Distal tip of nasogastric tube is seen in expected position of proximal stomach. Surgical staples are noted. Mild bibasilar subsegmental atelectasis is noted. Surgical drain is seen with tip in the left upper quadrant. Mildly  dilated small bowel loops are noted suggesting ileus. IMPRESSION: Mildly dilated small bowel loops are seen in left side of abdomen consistent with ileus. Distal tip of nasogastric tube seen in expected position of proximal stomach. Mild bibasilar subsegmental atelectasis. Electronically Signed   By: Marijo Conception M.D.   On: 10/29/2021 08:09   DG Abd 1 View  Result Date: 10/28/2021 CLINICAL DATA:  Feeding tube placement. EXAM: ABDOMEN - 1 VIEW COMPARISON:  October 28, 2021 FINDINGS: Nasogastric tube is seen with its distal tip noted within the body  of the stomach. Its distal side hole is approximately 7.9 cm distal to the expected region of the gastroesophageal junction. The bowel gas pattern is normal. Radiopaque surgical clips are seen within the right upper quadrant. No radio-opaque calculi or other significant radiographic abnormality are seen. IMPRESSION: Nasogastric tube positioning, as described above. Electronically Signed   By: Virgina Norfolk M.D.   On: 10/28/2021 19:23   DG Abd 1 View  Result Date: 10/28/2021 CLINICAL DATA:  Vomiting yesterday. EXAM: ABDOMEN - 1 VIEW COMPARISON:  10/27/2021 FINDINGS: Skin clips and surgical drains consistent with recent surgery. Surgical clips in the right upper quadrant. Residual contrast material within sigmoid colonic diverticula. Mildly gas distended small bowel, similar to prior study. This is likely ileus. No radiopaque stones. Degenerative changes in the spine and right hip. Left hip arthroplasty. IMPRESSION: Mild gaseous distention of small bowel similar to prior study, likely ileus in the postoperative setting. Recent postoperative changes in the abdomen and pelvis. Electronically Signed   By: Lucienne Capers M.D.   On: 10/28/2021 19:01            LOS: 10 days      Emeterio Reeve, DO [facility] 10/31/2021, 3:19 PM   Staff may message me via secure chat in [place]  but this may not receive immediate response,  please page for  urgent matters!  If 7PM-7AM, please contact night-coverage [email or web address]  Dictation software was used to generate the above note. Typos may occur and escape review, as with typed/written notes. Please contact Dr Sheppard Coil directly for clarity if needed.

## 2021-10-31 NOTE — Progress Notes (Signed)
Responded to patient's husband asking for help turning his wife.  She appeared uncomfortable, unable to get comfortable and states "my back is killing me".  With the help of charge and tech, placed patient on NRB and HFNC 15L, able to get sats up to 96-100% and clean and turn patient and replace purewick and change bed linens.  Patient continues having tachypnea and pain in back.  Medicated patient per order notifying primary nurse and charge nurse prior to administration.   Patient comfortable and able to tolerate teeth brushing and oral suctioning.  Patient had gurgling sounds in throat prior and able to clear secretions enough for patient to speak clearly.  Patient comfortable, husband at bedside.  Able to remove NRB and keep HFNC at 15L.  Sats 91-94%.

## 2021-10-31 NOTE — Progress Notes (Signed)
Pt taken off bipap and placed on 6lpm [place], sats 93%, respiratory rate 24/min, tolerating well at this time. Will continue to monitor.

## 2021-11-01 ENCOUNTER — Inpatient Hospital Stay: Payer: Medicare HMO

## 2021-11-01 DIAGNOSIS — Z515 Encounter for palliative care: Secondary | ICD-10-CM

## 2021-11-01 DIAGNOSIS — Z9889 Other specified postprocedural states: Secondary | ICD-10-CM | POA: Diagnosis not present

## 2021-11-01 LAB — COMPREHENSIVE METABOLIC PANEL
ALT: 8 U/L (ref 0–44)
AST: 14 U/L — ABNORMAL LOW (ref 15–41)
Albumin: 2.3 g/dL — ABNORMAL LOW (ref 3.5–5.0)
Alkaline Phosphatase: 51 U/L (ref 38–126)
Anion gap: 8 (ref 5–15)
BUN: 47 mg/dL — ABNORMAL HIGH (ref 8–23)
CO2: 24 mmol/L (ref 22–32)
Calcium: 8.5 mg/dL — ABNORMAL LOW (ref 8.9–10.3)
Chloride: 113 mmol/L — ABNORMAL HIGH (ref 98–111)
Creatinine, Ser: 0.92 mg/dL (ref 0.44–1.00)
GFR, Estimated: >60 mL/min (ref >60)
Glucose, Bld: 172 mg/dL — ABNORMAL HIGH (ref 70–99)
Potassium: 4.5 mmol/L (ref 3.5–5.1)
Sodium: 145 mmol/L (ref 135–145)
Total Bilirubin: 0.4 mg/dL (ref 0.3–1.2)
Total Protein: 5.7 g/dL — ABNORMAL LOW (ref 6.5–8.1)

## 2021-11-01 LAB — TYPE AND SCREEN
ABO/RH(D): O POS
Antibody Screen: NEGATIVE
Unit division: 0

## 2021-11-01 LAB — BRAIN NATRIURETIC PEPTIDE: B Natriuretic Peptide: 796.2 pg/mL — ABNORMAL HIGH (ref 0.0–100.0)

## 2021-11-01 LAB — MAGNESIUM: Magnesium: 2.4 mg/dL (ref 1.7–2.4)

## 2021-11-01 LAB — CBC
HCT: 28.9 % — ABNORMAL LOW (ref 36.0–46.0)
Hemoglobin: 8.3 g/dL — ABNORMAL LOW (ref 12.0–15.0)
MCH: 33.1 pg (ref 26.0–34.0)
MCHC: 28.7 g/dL — ABNORMAL LOW (ref 30.0–36.0)
MCV: 115.1 fL — ABNORMAL HIGH (ref 80.0–100.0)
Platelets: 189 10*3/uL (ref 150–400)
RBC: 2.51 MIL/uL — ABNORMAL LOW (ref 3.87–5.11)
RDW: 18 % — ABNORMAL HIGH (ref 11.5–15.5)
WBC: 7.1 10*3/uL (ref 4.0–10.5)
nRBC: 0.3 % — ABNORMAL HIGH (ref 0.0–0.2)

## 2021-11-01 LAB — GLUCOSE, CAPILLARY
Glucose-Capillary: 149 mg/dL — ABNORMAL HIGH (ref 70–99)
Glucose-Capillary: 159 mg/dL — ABNORMAL HIGH (ref 70–99)
Glucose-Capillary: 170 mg/dL — ABNORMAL HIGH (ref 70–99)
Glucose-Capillary: 184 mg/dL — ABNORMAL HIGH (ref 70–99)

## 2021-11-01 LAB — BPAM RBC
Blood Product Expiration Date: 202308312359
ISSUE DATE / TIME: 202307300058
Unit Type and Rh: 5100

## 2021-11-01 LAB — PHOSPHORUS: Phosphorus: 2.3 mg/dL — ABNORMAL LOW (ref 2.5–4.6)

## 2021-11-01 LAB — TRIGLYCERIDES: Triglycerides: 211 mg/dL — ABNORMAL HIGH (ref <150)

## 2021-11-01 MED ORDER — MORPHINE 100MG IN NS 100ML (1MG/ML) PREMIX INFUSION
0.0000 mg/h | INTRAVENOUS | Status: DC
  Administered 2021-11-01: 5 mg/h via INTRAVENOUS
  Filled 2021-11-01: qty 100

## 2021-11-01 MED ORDER — SODIUM CHLORIDE 0.9 % IV SOLN: INTRAVENOUS | Status: DC

## 2021-11-01 MED ORDER — ONDANSETRON 4 MG PO TBDP: 4.0000 mg | ORAL_TABLET | Freq: Four times a day (QID) | ORAL | Status: DC | PRN

## 2021-11-01 MED ORDER — MORPHINE BOLUS VIA INFUSION: 5.0000 mg | INTRAVENOUS | Status: DC | PRN

## 2021-11-01 MED ORDER — POLYVINYL ALCOHOL 1.4 % OP SOLN: 1.0000 [drp] | Freq: Four times a day (QID) | OPHTHALMIC | Status: DC | PRN

## 2021-11-01 MED ORDER — LORAZEPAM 2 MG/ML IJ SOLN: 2.0000 mg | INTRAMUSCULAR | Status: DC | PRN

## 2021-11-01 MED ORDER — ACETAMINOPHEN 325 MG PO TABS: 650.0000 mg | ORAL_TABLET | Freq: Four times a day (QID) | ORAL | Status: DC | PRN

## 2021-11-01 MED ORDER — ACETAMINOPHEN 650 MG RE SUPP: 650.0000 mg | Freq: Four times a day (QID) | RECTAL | Status: DC | PRN

## 2021-11-01 MED ORDER — ONDANSETRON HCL 4 MG/2ML IJ SOLN: 4.0000 mg | Freq: Four times a day (QID) | INTRAMUSCULAR | Status: DC | PRN

## 2021-11-01 MED ORDER — GLYCOPYRROLATE 1 MG PO TABS: 1.0000 mg | ORAL_TABLET | ORAL | Status: DC | PRN

## 2021-11-01 MED ORDER — HALOPERIDOL LACTATE 5 MG/ML IJ SOLN: 2.5000 mg | INTRAMUSCULAR | Status: DC | PRN

## 2021-11-01 MED ORDER — TRACE MINERALS CU-MN-SE-ZN 300-55-60-3000 MCG/ML IV SOLN
INTRAVENOUS | Status: DC
  Filled 2021-11-01: qty 672

## 2021-11-01 MED ORDER — GLYCOPYRROLATE 0.2 MG/ML IJ SOLN: 0.2000 mg | INTRAMUSCULAR | Status: DC | PRN

## 2021-11-02 ENCOUNTER — Inpatient Hospital Stay: Payer: Medicare HMO | Admitting: Internal Medicine

## 2021-11-02 ENCOUNTER — Inpatient Hospital Stay: Payer: Medicare HMO

## 2021-11-02 NOTE — Progress Notes (Signed)
Patient seen and examined.  Events over the weekend noted.  Incident summary she had significant dearterialization from a pulmonary perspective requiring BiPAP.  This is secondary to hypoxic respiratory failure. CT scan personally reviewed as well has multiple abdominal x-ray.  There is no evidence of anastomotic leak there is no evidence of intra-abdominal abscess or any evidence of complications related to her surgery.  There is evidence of ileus that is suspected due to her disease process and to the reaction that she has had from A-fib, pneumonia and hypoxic respiratory failure in addition to a back surgery.  PE she is on BiPAP she answers simple questions.  She denies any abdominal pain Abd: Soft nontender incision healing well without evidence of infection.  Drain with serous fluid.  No peritonitis.  A/P toxic respiratory failure in a patient with minimal physiologic reserve. I do think that the A-fib and combined pneumonia was way too much for her physiologic reserve.  This in addition to the trauma response from the operation are making her body give up.  She is severely weak. I had a discussion with the family and they also have observed the rapid decline in her overall health for the last few days. I confirm with them that she does not want to be intubated She is not tolerating weaning off the BiPAP at this point I have also discussed with Dr. Sheppard Coil who will have further discussion regarding next step in treatment. I do not have anything much to offer from a surgical perspective other than continuation of TPN and NG tube Unfortunate situation

## 2021-11-02 NOTE — Progress Notes (Signed)
PROGRESS NOTE    Jenna [name]  QPR:[id number] DOB: 1940-10-01  DOA: 10/19/2021 Date of Service: 2021-11-10 PCP: [facility]     Brief Narrative / Hospital Course:   81 year old female with past medical history of colon cancer (adenocarcinoma with large bowel obstruction requiring emergent laparotomy and subtotal colectomy in August 2022), hypothyroidism and hyperlipidemia admitted on 7/20 by general surgery for colostomy reversal and abdominal wall reconstruction who developed new onset paroxysmal atrial fibrillation on 7/22.  Recent PET scan discovered metastases to liver and lung.   CARDIAC: Hospitalists were consulted for management of postoperative atrial fibrillation.  Patient started on IV amiodarone drip. Transitioned off and rate has been controlled. Cardiology consulted given hypotensive. Started low dose metoprolol. BP remained low, pt asymptomatic but not ambulating much. Have had to hold metoprolol at tiems d/t hypotension. Echo this admission EF 55-60, normal LV function, diastolic parameters indeterminate, normal RV fxn. 07/26: switching metoprolol to 12.5 mg bid and transition from lovenox to eliquis 5 mg bid at discretion of surgeon and hospitalist - cardiology has signed off. 07/27: BP low but stable, was hypotensive overnight which responded to IV fluids. 07/28-07/29: HR improving, BP stable. 07/29 PM: SOB and CXR concerning for worsening cardiomegaly (vs rotation?) and pt started on Lasix continued into 07/30.  RESPIRATORY / ID: Repeated CXR 07/24 to eval PNA given coughing: RLL findings appear progressed compared to previous CXR, initiated CAP treatment. Pt had been stable on room air, cough improving, using her incentive spirometer. New O2 requirement but she was declining CXR 07/29 "tired of Xrays," in evening O2 became dislodged and pt desaturated requiring BiPap, CXR at that time noted new cardiomegaly (vs rotation?), pt was transfer to SDU w/ blood  transfusion and lasix x2 doses ordered, ABG improved on BiPap, pt has been on and off BiPap, encouraged compliance w this and pulmonary is following along  SURGICAL / GI: Concern for ileus 07/26 but no evidence of serious complications. 07/27: no BM x4 days, (+)nausea mid-day progressed to vomiting. Placed NG tube for concern for obstruction, gen surg (Dr Dahlia Byes) following, NG helped bloating and symptoms. 07/28: TPN and PICC. CT showed ileus no obstruction or other intraabdominal concerns.  GOALS OF CARE - COMFORT MEASURES: See full IPAL note November 10, 2021.  Given patient's continued decline and minimal to the low likelihood of recovery, as well as underlying cancer, medical team and family in shared decision making have opted to discontinue aggressive measures at this time and place patient on comfort care protocol. Progression goals: resolution/improvement of postoperative ileus, pending placement hoping to get inpatient rehab approved but anticipate will be here at least through mid next week pending medical stabilization.    Subjective: Patient is somnolent today, on BiPap. Resting comfortably.      ASSESSMENT & PLAN:   Principal Problem:   S/P colostomy takedown Active Problems:   Paroxysmal atrial fibrillation with RVR (HCC)   Acute kidney injury superimposed on stage IIIa CKD ([place])   Metastatic colon cancer to liver [facility])   Essential hypertension   Hyperlipidemia   Acquired hypothyroidism   Overweight (BMI 25.0-29.9)   Atrial fibrillation (HCC)  CARDIAC: Paroxysmal atrial fibrillation with RVR ([place]) - RESOLVED Likely HFpEF exacerbation now, suspect fluid overload d/t TPN CHADS2VASC 6.  Echocardiogram done 7/23, preserved EF, unable to assess diastolic function. Essential hypertension, now intermittently hypotensive Orthostatic hypotensive Anticoagulation: currently heparin. Until she is further out from surgery, would not start full dose anticoagulation,but plan for ELiquis.  Rate  control:  Off amio/dilt d/t hypotension. Cardiology started low-dose metoprolol and patient has tolerated this well but BP remains soft.  Now on IV since is n.p.o. with NG in place.  Given change in condition, have reached out to cardiology as FYI in case need to reengage: advised Lopressor 5 mg IV every 6 hours as needed for rate over 035 hold for systolic pressure less than 100 CHF: diuresing  11-04-21: comfort measures - discontinuing the above   RESPIRATORY Abnormal CXR concerning for RLL pneumonia Community Acquired Pneumonia Smoker - interested in quitting Pulmonary following  Pt declined CXR yesterday but resp status decompensated, CXR yesterday evening appears CHF related, see above   Antibiotics: Started Abx 10/25/21 w/ augmentin + azithro --> ID now that NG is in place, continue IV azithromycin and Zosyn. Initial plan was to d/c today but will continue for now given respiratory deterioration but suspect this more likely d/t CHF   Bronchodilation: duonebs O2 support: HFNC / BiPap Other: treating CHF / diuresing 11/04/21: comfort measures - discontinuing the above   GASTROINTESTINAL/SURGICAL S/P colostomy takedown Postoperative ileus Metastatic colon cancer to liver [facility]) Management as per surgery. NG is in place PICC line and TPN 10/29/2021 CT abd/pelv no obstruction or other sign complication  00/93/8182: comfort measures - discontinuing the above, discussed option to leave NG tube in place as this may be providing some comfort to relieve abdominal distention, though it is suctioning minimally patient's abdomen is still a bit distended and I do not think NG tube is causing any undue pain.  Family is in agreement, questions answered.  ENDOCRINE Acquired hypothyroidism Continue Synthroid 2021/11/04: comfort measures - discontinuing the above  RENAL Acute kidney injury superimposed on stage IIIa CKD ([place]) Stable/improving Lab Results  Component Value Date   CREATININE  0.92 2021-11-04   CREATININE 1.12 (H) 10/31/2021   CREATININE 1.12 (H) 10/30/2021  2021/11/04: comfort measures - discontinuing the above  Hyperlipidemia Holding po statin 11-04-21: comfort measures - discontinuing the above  Overweight (BMI 25.0-29.9) Meets criteria for BMI greater than 25 2021-11-04: comfort measures - discontinuing the above  DVT prophylaxis: SCD and Lovenox Code Status: DNR Family Communication: see IPAL note Dispo: Now on inpatient comfort measures only status, anticipate hospital death within the next 24 hours once BiPAP is discontinued             Objective: Vitals:   11/04/2021 0800 11/04/2021 0900 11-04-21 1124 11-04-21 1200  BP: 122/73 120/72  123/66  Pulse: 99 98 (!) 102 98  Resp: (!) 25 19 (!) 21 19  Temp:      TempSrc:      SpO2: 100% 100% 98% 97%  Weight:      Height:        Intake/Output Summary (Last 24 hours) at Nov 04, 2021 1334 Last data filed at 2021-11-04 1000 Gross per 24 hour  Intake 408.69 ml  Output 2815 ml  Net -2406.31 ml   Filed Weights   10/29/2021 0807 10/29/21 1655  Weight: 71.7 kg 74.6 kg    Examination:  Constitutional:  VS as above General Appearance: somnolent, NAD Ears, Nose, Mouth, Throat: Dry MM NG in place  Neck: No masses, trachea midline Respiratory: respiratory effort minima - BiPap is in place  Breath sounds coarse all fields  Cardiovascular: S1/S2 normal, irreg irreg No lower extremity edema Gastrointestinal: Tympanic to percussion Nontender, avoided deep palpation near surgical sites  Bowel sounds faint  Musculoskeletal:  No clubbing/cyanosis of digits      Scheduled Medications:  arformoterol  15 mcg Nebulization BID   Chlorhexidine Gluconate Cloth  6 each Topical Daily   enoxaparin (LOVENOX) injection  1.25 mg/kg Subcutaneous Q12H   insulin aspart  0-6 Units Subcutaneous Q4H   pantoprazole (PROTONIX) IV  40 mg Intravenous Q12H   revefenacin  175 mcg Nebulization Daily    sodium chloride flush  10-40 mL Intracatheter Q12H   sodium chloride flush  10-40 mL Intracatheter Q12H    Continuous Infusions:  sodium chloride Stopped (10/23/21 0003)   sodium chloride     azithromycin 500 mg (10/31/21 2027)   methocarbamol (ROBAXIN) IV     morphine     piperacillin-tazobactam (ZOSYN)  IV 3.375 g (11-26-2021 0531)   TPN ADULT (ION) 75 mL/hr at 10/31/21 1755   TPN ADULT (ION)      PRN Medications:  sodium chloride, acetaminophen **OR** acetaminophen, albuterol, [DISCONTINUED] diphenhydrAMINE **OR** diphenhydrAMINE, glycopyrrolate **OR** glycopyrrolate **OR** glycopyrrolate, haloperidol lactate, iohexol, LORazepam, [DISCONTINUED] methocarbamol **OR** methocarbamol (ROBAXIN) IV, metoprolol tartrate, morphine injection, morphine, ondansetron **OR** ondansetron (ZOFRAN) IV, polyvinyl alcohol, sodium chloride flush, sodium chloride flush  Antimicrobials:  Anti-infectives (From admission, onward)    Start     Dose/Rate Route Frequency Ordered Stop   10/31/21 0915  piperacillin-tazobactam (ZOSYN) IVPB 3.375 g        3.375 g 12.5 mL/hr over 240 Minutes Intravenous Every 8 hours 10/31/21 0822     10/29/21 2100  azithromycin (ZITHROMAX) 500 mg in sodium chloride 0.9 % 250 mL IVPB        500 mg 250 mL/hr over 60 Minutes Intravenous Every 24 hours 10/29/21 0949     10/29/21 1045  piperacillin-tazobactam (ZOSYN) IVPB 3.375 g  Status:  Discontinued        3.375 g 12.5 mL/hr over 240 Minutes Intravenous Every 12 hours 10/29/21 0949 10/31/21 0822   10/27/21 1045  amoxicillin-clavulanate (AUGMENTIN) 875-125 MG per tablet 1 tablet  Status:  Discontinued        1 tablet Oral Every 12 hours 10/27/21 0959 10/29/21 0949   10/26/21 1000  azithromycin (ZITHROMAX) tablet 250 mg  Status:  Discontinued        250 mg Oral Daily 10/25/21 1357 10/29/21 0949   10/25/21 1445  amoxicillin-clavulanate (AUGMENTIN) 500-125 MG per tablet 500 mg  Status:  Discontinued        1 tablet Oral Every 12  hours 10/25/21 1357 10/27/21 0959   10/25/21 1445  azithromycin (ZITHROMAX) tablet 500 mg        500 mg Oral  Once 10/25/21 1357 10/25/21 1544   10/20/2021 2200  cefoTEtan (CEFOTAN) 2 g in sodium chloride 0.9 % 100 mL IVPB        2 g 200 mL/hr over 30 Minutes Intravenous Every 8 hours 10/02/2021 1739 10/22/21 0639   10/29/2021 1455  sodium chloride 0.9 % with cefoTEtan (CEFOTAN) ADS Med       Note to Pharmacy: Register, Santiago Glad A: cabinet override      10/08/2021 1455 10/04/2021 1549   10/07/2021 0816  sodium chloride 0.9 % with cefoTEtan (CEFOTAN) ADS Med       Note to Pharmacy: Herby Abraham W: cabinet override      10/25/2021 0816 10/15/2021 1139   10/23/2021 0730  cefoTEtan (CEFOTAN) 2 g in sodium chloride 0.9 % 100 mL IVPB        2 g 200 mL/hr over 30 Minutes Intravenous On call to O.R. 10/10/2021 0728 10/25/2021 1527       Data Reviewed: I have  personally reviewed following labs and imaging studies  CBC: Recent Labs  Lab 10/29/21 0930 10/30/21 0610 10/30/21 2127 10/31/21 0435 11/19/2021 0430  WBC 12.6* 12.3* 12.6* 9.0 7.1  NEUTROABS  --   --  11.2*  --   --   HGB 7.6* 7.3* 7.7* 8.6* 8.3*  HCT 23.8* 22.7* 23.9* 26.9* 28.9*  MCV 96.4 96.6 97.2 97.1 115.1*  PLT 186 181 197 182 161   Basic Metabolic Panel: Recent Labs  Lab 10/28/21 0525 10/29/21 0930 10/30/21 0610 10/30/21 2127 10/31/21 0435 2021-11-19 0953  NA 134* 135 133* 133* 136 145  K 3.5 4.1 4.3 4.5 4.4 4.5  CL 107 107 107 108 108 113*  CO2 19* 15* 18* 18* 21* 24  GLUCOSE 100* 107* 148* 204* 196* 172*  BUN 34* 32* 37* 42* 44* 47*  CREATININE 1.30* 1.36* 1.25* 1.12* 1.12* 0.92  CALCIUM 8.6* 8.7* 8.5* 8.4* 8.3* 8.5*  MG 1.9 2.0 2.2  --  2.3 2.4  PHOS 4.7* 5.6* 4.9*  --  3.1 2.3*   GFR: Estimated Creatinine Clearance: 49.9 mL/min (by C-G formula based on SCr of 0.92 mg/dL). Liver Function Tests: Recent Labs  Lab 10/29/21 0930 10/30/21 0610 Nov 19, 2021 0953  AST 14* 13* 14*  ALT 9 9 8   ALKPHOS 62 58 51  BILITOT 1.2 0.8 0.4   PROT 5.2* 5.5* 5.7*  ALBUMIN 2.1* 2.4* 2.3*   No results for input(s): "LIPASE", "AMYLASE" in the last 168 hours. No results for input(s): "AMMONIA" in the last 168 hours. Coagulation Profile: Recent Labs  Lab 10/29/21 0930  INR 1.2   Cardiac Enzymes: No results for input(s): "CKTOTAL", "CKMB", "CKMBINDEX", "TROPONINI" in the last 168 hours. BNP (last 3 results) No results for input(s): "PROBNP" in the last 8760 hours. HbA1C: No results for input(s): "HGBA1C" in the last 72 hours. CBG: Recent Labs  Lab 10/31/21 2001 11/19/2021 0000 11/19/2021 0446 11/19/2021 0721 19-Nov-2021 1134  GLUCAP 175* 170* 149* 159* 184*   Lipid Profile: Recent Labs    10/31/21 0435 11/19/2021 0953  TRIG 270* 211*   Thyroid Function Tests: No results for input(s): "TSH", "T4TOTAL", "FREET4", "T3FREE", "THYROIDAB" in the last 72 hours. Anemia Panel: No results for input(s): "VITAMINB12", "FOLATE", "FERRITIN", "TIBC", "IRON", "RETICCTPCT" in the last 72 hours. Urine analysis:    Component Value Date/Time   COLORURINE YELLOW (A) 11/14/2020 2000   APPEARANCEUR HAZY (A) 11/14/2020 2000   LABSPEC 1.027 11/14/2020 2000   PHURINE 6.0 11/14/2020 2000   GLUCOSEU NEGATIVE 11/14/2020 2000   HGBUR NEGATIVE 11/14/2020 2000   BILIRUBINUR NEGATIVE 11/14/2020 2000   KETONESUR NEGATIVE 11/14/2020 2000   PROTEINUR NEGATIVE 11/14/2020 2000   NITRITE NEGATIVE 11/14/2020 2000   LEUKOCYTESUR MODERATE (A) 11/14/2020 2000   Sepsis Labs: @LABRCNTIP (procalcitonin:4,lacticidven:4)  No results found for this or any previous visit (from the past 240 hour(s)).       Radiology Studies last 96 hours: DG ABD ACUTE 2+V W 1V CHEST  Result Date: November 19, 2021 CLINICAL DATA:  Status post colostomy takedown. EXAM: DG ABDOMEN ACUTE WITH 1 VIEW CHEST COMPARISON:  October 30, 2021. FINDINGS: Stable cardiomegaly. Nasogastric tube is seen entering the stomach. Right internal jugular Port-A-Cath is unchanged in position. Mild central  pulmonary vascular congestion is noted. Bibasilar atelectasis or edema is noted with associated pleural effusions. Dilated small bowel loops are seen in the left side of the abdomen. No colonic dilatation is noted. Surgical drain is noted. IMPRESSION: Dilated small bowel loops are noted concerning for ileus or possibly distal  small bowel obstruction. Mild central pulmonary vascular congestion is noted with bibasilar atelectasis or edema and associated pleural effusions. Electronically Signed   By: Marijo Conception M.D.   On: 11/19/2021 11:05   DG Chest 1 View  Result Date: 10/30/2021 CLINICAL DATA:  Hypoxia. EXAM: CHEST  1 VIEW COMPARISON:  Chest radiograph dated 10/29/2021. FINDINGS: Support lines and tubes in similar position. Interval worsening of airspace opacities involving the mid to lower lung field, right greater left, compared to prior radiograph. Findings likely represent increased pleural effusion and associated atelectasis or infiltrate. No pneumothorax. Stable cardiomegaly. Atherosclerotic calcification of the aorta. Degenerative changes of the spine. No acute osseous pathology. IMPRESSION: Worsening airspace opacities involving the mid to lower lung fields, right greater left, likely representing increased pleural effusion and associated atelectasis or infiltrate. Electronically Signed   By: Anner Crete M.D.   On: 10/30/2021 21:27   CT ABDOMEN PELVIS W CONTRAST  Result Date: 10/29/2021 CLINICAL DATA:  Abdominal pain, nonlocalized EXAM: CT ABDOMEN AND PELVIS WITH CONTRAST TECHNIQUE: Multidetector CT imaging of the abdomen and pelvis was performed using the standard protocol following bolus administration of intravenous contrast. RADIATION DOSE REDUCTION: This exam was performed according to the departmental dose-optimization program which includes automated exposure control, adjustment of the mA and/or kV according to patient size and/or use of iterative reconstruction technique. CONTRAST:   156mL OMNIPAQUE IOHEXOL 300 MG/ML  SOLN COMPARISON:  CT chest, abdomen and pelvis dated Aug 12, 2021. FINDINGS: Lower chest: Small bilateral pleural effusions and atelectasis. Hepatobiliary: Small lesion which was previously described in the anterior inferior right lobe of the liver is no longer visualized. Suspicious liver lesions. Status post cholecystectomy. No biliary dilatation. Pancreas: Unremarkable. No pancreatic ductal dilatation or surrounding inflammatory changes. Spleen: Normal in size without focal abnormality. Adrenals/Urinary Tract: Bilateral adrenal glands are unremarkable. No hydronephrosis. Nonobstructing stone of the right kidney. Stable simple appearing cyst of the left kidney. Small locule of air is seen in the urinary bladder, correlate for history of recent instrumentation. No evidence of bladder wall thickening, however streak artifact from left hip arthroplasty somewhat limits evaluation. Stomach/Bowel: Interval ileostomy takedown. Diffusely dilated small bowel with a gradual transition of the site of the anastomosis. Normal appearance of the anastomosis. Small bowel feces sign seen just upstream of the anastomosis, compatible with delayed transit. No bowel wall thickening or evidence of pneumatosis. Contrast material is seen in the proximal small bowel loops which becomes diluted, no definite passage of contrast beyond the anastomosis. Diverticulosis of the sigmoid colon with retained contrast material seen in several diverticula. Vascular/Lymphatic: Aortic atherosclerosis. New prominent subcentimeter mesenteric lymph nodes seen in the right lower quadrant, reference node measuring 9 mm on series 2, image 57. No pathologically enlarged lymph nodes seen in the pelvis. Reproductive: No pelvic mass. Other: Trace abdominal ascites and small locules of intraperitoneal free air, likely postsurgical. Postsurgical changes of the anterior right abdominal wall with surgical drain place.  Musculoskeletal: Prior left total hip arthroplasty. No aggressive appearing osseous lesions. IMPRESSION: 1. Interval ileostomy takedown. Diffusely dilated small bowel, findings are compatible with ileus. 2. Small bilateral pleural effusions and atelectasis. 3. New prominent subcentimeter mesenteric lymph nodes are seen in the right lower quadrant, likely reactive. Recommend attention on follow-up per clinical protocol. 4. Small locule of air is seen in the urinary bladder, likely due to recent instrumentation. Infection is an additional consideration. Correlate with urinalysis. 5.  Aortic Atherosclerosis (ICD10-I70.0). Electronically Signed   By: Hosie Poisson.D.  On: 10/29/2021 14:52   DG Chest Port 1 View  Result Date: 10/29/2021 CLINICAL DATA:  PICC line placement. EXAM: PORTABLE CHEST 1 VIEW COMPARISON:  Same day FINDINGS: Interval placement of right-sided PICC line with distal tip in expected position of the SVC. Right internal jugular Port-A-Cath is unchanged. Stable bibasilar atelectasis and effusions are noted. IMPRESSION: Interval placement of right-sided PICC line with distal tip in expected position of the SVC. Electronically Signed   By: Marijo Conception M.D.   On: 10/29/2021 14:09   [place] EKG SITE RITE  Result Date: 10/29/2021 If Site Rite image not attached, placement could not be confirmed due to current cardiac rhythm.  DG ABD ACUTE 2+V W 1V CHEST  Result Date: 10/29/2021 CLINICAL DATA:  Postoperative ileus.  Abdominal pain. EXAM: DG ABDOMEN ACUTE WITH 1 VIEW CHEST COMPARISON:  October 28, 2021. FINDINGS: Distal tip of nasogastric tube is seen in expected position of proximal stomach. Surgical staples are noted. Mild bibasilar subsegmental atelectasis is noted. Surgical drain is seen with tip in the left upper quadrant. Mildly dilated small bowel loops are noted suggesting ileus. IMPRESSION: Mildly dilated small bowel loops are seen in left side of abdomen consistent with ileus. Distal tip  of nasogastric tube seen in expected position of proximal stomach. Mild bibasilar subsegmental atelectasis. Electronically Signed   By: Marijo Conception M.D.   On: 10/29/2021 08:09   DG Abd 1 View  Result Date: 10/28/2021 CLINICAL DATA:  Feeding tube placement. EXAM: ABDOMEN - 1 VIEW COMPARISON:  October 28, 2021 FINDINGS: Nasogastric tube is seen with its distal tip noted within the body of the stomach. Its distal side hole is approximately 7.9 cm distal to the expected region of the gastroesophageal junction. The bowel gas pattern is normal. Radiopaque surgical clips are seen within the right upper quadrant. No radio-opaque calculi or other significant radiographic abnormality are seen. IMPRESSION: Nasogastric tube positioning, as described above. Electronically Signed   By: Virgina Norfolk M.D.   On: 10/28/2021 19:23   DG Abd 1 View  Result Date: 10/28/2021 CLINICAL DATA:  Vomiting yesterday. EXAM: ABDOMEN - 1 VIEW COMPARISON:  10/27/2021 FINDINGS: Skin clips and surgical drains consistent with recent surgery. Surgical clips in the right upper quadrant. Residual contrast material within sigmoid colonic diverticula. Mildly gas distended small bowel, similar to prior study. This is likely ileus. No radiopaque stones. Degenerative changes in the spine and right hip. Left hip arthroplasty. IMPRESSION: Mild gaseous distention of small bowel similar to prior study, likely ileus in the postoperative setting. Recent postoperative changes in the abdomen and pelvis. Electronically Signed   By: Lucienne Capers M.D.   On: 10/28/2021 19:01            LOS: 11 days      Emeterio Reeve, DO [facility] 2021/11/09, 1:34 PM   Staff may message me via secure chat in [place]  but this may not receive immediate response,  please page for urgent matters!  If 7PM-7AM, please contact night-coverage [email or web address]  Dictation software was used to generate the above note. Typos may occur and escape  review, as with typed/written notes. Please contact Dr Sheppard Coil directly for clarity if needed.

## 2021-11-02 NOTE — TOC Progression Note (Signed)
Transition of Care [facility]) - Progression Note    Patient Details  Name: Jenna [name] MRN: [id number] Date of Birth: 09-06-1940  Transition of Care [facility]) CM/SW Contact  Shelbie Hutching, RN Phone Number: 2021/11/21, 10:30 AM  Clinical Narrative:    Patient has been transferred to the ICU stepdown status on Bipap.  CIR is signing off for now due to medical instability.  PT and OT will need to be re-consulted once patient more stable.  TOC continues to follow to assist with discharge planning.    Expected Discharge Plan: [place] Barriers to Discharge: Barriers Resolved  Expected Discharge Plan and Services Expected Discharge Plan: [place]   Discharge Planning Services: CM Consult Post Acute Care Choice: [place] arrangements for the past 2 months: Single Family Home                 DME Arranged: N/A         HH Arranged: PT HH Agency: [place] Date [place]: 10/25/21 Time [place] Agency Contacted: [id number] Representative spoke with at [place]: [place] ([place]) Interventions    Readmission Risk Interventions     No data to display

## 2021-11-02 NOTE — Progress Notes (Signed)
Patient removed BIPAP mask; RN attempted to place mask back on; patient increasingly agitated and removed mask again; RN placed patient on 5 L of oxygen via nasal canula; oxygen saturation improved to 94%.

## 2021-11-02 NOTE — Progress Notes (Signed)
Inpatient Rehab Admissions Coordinator:  Note pt's change in status. Now on BiPAP. Due to transfer to ICU, therapies have discontinued orders. Pt is not medically ready for possible CIR admission. AC will sign off. Please reconsult if pt becomes medically appropriate for CIR.   Gayland Curry, [place], [place] Admissions Coordinator [phone]

## 2021-11-02 NOTE — Consult Note (Signed)
[place] at [facility] [phone] [phone]   Name: Jenna [name] Date: 2021/11/07 MRN: [id number]  DOB: [birth year]  Patient Care Team: [facility] as PCP - General Kate Sable, MD as PCP - Cardiology (Cardiology) Clent Jacks, RN as Oncology Nurse Navigator Cammie Sickle, MD as Consulting Physician (Oncology)    REASON FOR CONSULTATION: Jenna [name] is a 81 y.o. female with multiple medical problems including stage IVb adenocarcinoma of colon with history of large bowel obstruction requiring emergency laparotomy and subtotal colectomy in August 2022, hypothyroidism, hyperlipidemia.  Patient has recently been on FOLFOX chemotherapy.  Patient was admitted on 7/20 for colostomy reversal and abdominal wall reconstruction.  Her hospitalization has been complicated by A-fib, ileus, pneumonia, and hypoxic respiratory failure requiring BiPAP.  Palliative care was consulted to help address goals.  SOCIAL HISTORY:     reports that she has been smoking cigarettes. She has been smoking an average of .25 packs per day. She has never used smokeless tobacco. She reports that she does not drink alcohol and does not use drugs.  Patient is married and lives at home with her husband.  She has a son and daughter who are involved.  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  ADVANCE DIRECTIVES:  Not on file  CODE STATUS: DNR  PAST MEDICAL HISTORY: Past Medical History:  Diagnosis Date   BRCA negative 03/2020   MyRisk neg except MSH3 VUS   Colon cancer ([place])    Family history of breast cancer    IBIS=4.4%/riskscore=2.9%   Gout    Hyperlipidemia    Hypertension    Scarlet fever    Stroke ([place]) 05/2019   Thyroid disease     PAST SURGICAL HISTORY:  Past Surgical History:  Procedure Laterality Date   ABDOMINAL WALL DEFECT REPAIR N/A 10/31/2021   Procedure:  REPAIR ABDOMINAL WALL;  Surgeon: Jules Husbands, MD;  Location: [facility] ORS;  Service: General;  Laterality: N/A;   APPLICATION OF WOUND VAC  11/16/2020   Procedure: APPLICATION OF WOUND VAC;  Surgeon: Fredirick Maudlin, MD;  Location: [place] [facility];  Service: General;;  Serial # [id number]   BREAST BIOPSY Right 1978   neg   CHOLECYSTECTOMY     COLECTOMY WITH COLOSTOMY CREATION/HARTMANN PROCEDURE N/A 11/16/2020   Procedure: SUBTOTAL COLECTOMY WITH ILEOSTOMY CREATION;  Surgeon: Fredirick Maudlin, MD;  Location: [facility] ORS;  Service: General;  Laterality: N/A;   COLOSTOMY TAKEDOWN N/A 10/04/2021   Procedure: COLOSTOMY TAKEDOWN-RNFA to assist;  Surgeon: Jules Husbands, MD;  Location: [facility] ORS;  Service: General;  Laterality: N/A;  provider is requesting 4 hours total for these procedures   FLEXIBLE SIGMOIDOSCOPY N/A 09/09/2021   Procedure: FLEXIBLE SIGMOIDOSCOPY;  Surgeon: Lin Landsman, MD;  Location: [facility] ENDOSCOPY;  Service: Gastroenterology;  Laterality: N/A;   FLEXIBLE SIGMOIDOSCOPY N/A 09/10/2021   Procedure: FLEXIBLE SIGMOIDOSCOPY;  Surgeon: Lin Landsman, MD;  Location: [facility] ENDOSCOPY;  Service: Gastroenterology;  Laterality: N/A;   LAPAROTOMY N/A 11/16/2020   Procedure: EXPLORATORY LAPAROTOMY;  Surgeon: Fredirick Maudlin, MD;  Location: [facility] ORS;  Service: General;  Laterality: N/A;   PORTACATH PLACEMENT N/A 06/03/2021   Procedure: INSERTION PORT-A-CATH;  Surgeon: Jules Husbands, MD;  Location: [facility] ORS;  Service: General;  Laterality: N/A;  Provider requesting 1 hour / 60 minutes for procedure.   VENTRAL HERNIA REPAIR N/A 10/22/2021   Procedure: HERNIA REPAIR VENTRAL ADULT;  Surgeon: Dahlia Byes, [place]  F, MD;  Location: [facility] ORS;  Service: General;  Laterality: N/A;    HEMATOLOGY/ONCOLOGY HISTORY:  Oncology History Overview Note    # Colol[place]oy prior- 5.8 prior  Procedure: Subtotal colectomy   TUMOR  Tumor Site: Descending colon  Histologic Type: Adenocarcinoma  Histologic Grade: G2,  moderately differentiated  Tumor Size: Greatest dimension: 4.5 cm  Tumor Extent: Invades through the muscularis propria into pericolorectal  tissue, see comment  Macroscopic Tumor Perforation: Not identified, see comment  Lymph-Vascular Invasion: Not identified  Perineural Invasion: Present  Treatment Effect: No known surgical therapy   MARGINS  Margin Status for Invasive Carcinoma: All margins negative for invasive  carcinoma  Margins examined: Proximal, distal, mesenteric  Margin Status for Non-Invasive Tumor: All margins negative for  high-grade dysplasia/intramucosal carcinoma and low-grade dysplasia   REGIONAL LYMPH NODES  Regional Lymph Nodes: All regional lymph nodes negative for tumor  Number of lymph nodes examined: 29  Tumor Deposits: Not identified   PATHOLOGIC STAGE CLASSIFICATION (pTNM, AJCC 8th Edition):  TNM Descriptors: Not applicable  pT3 at least, see comment  pN0  pM - Not applicable    # JAN [id number] tumor marker [Ct DNA]-CT scan-liver lesion lung nodules/FEB 2023- PET scan-progressive disease.  # MARCH 6th- FOLFOX [bev ineligible-stroke 2021]   ;# NGS- PD-L1/TPS- 0%.; NGS- K/N-Ras- WLID type     # HLP; Acute Stroke [2021s/p TPA;[facility]]; Gout on allopurinol   Cancer of left colon ([place])  11/30/2020 Initial Diagnosis   Cancer of left colon ([place])   12/29/2020 Cancer Staging   Staging form: Colon and Rectum, AJCC 8th Edition - Pathologic: Stage IIA (pT3, pN0, cM0) - Signed by Cammie Sickle, MD on 12/29/2020 Total positive nodes: 0   05/31/2021 Cancer Staging   Staging form: Colon and Rectum, AJCC 8th Edition - Pathologic: Stage IVB (pTX, pNX, cM1b) - Signed by Cammie Sickle, MD on 05/31/2021   06/07/2021 -  Chemotherapy   Patient is on Treatment Plan : COLORECTAL FOLFOX q14d       ALLERGIES:  has No Known Allergies.  MEDICATIONS:  Current Facility-Administered Medications  Medication Dose Route Frequency Provider Last Rate Last  Admin   0.9 %  sodium chloride infusion   Intravenous PRN Annita Brod, MD   Stopped at 10/23/21 0003   0.9 %  sodium chloride infusion   Intravenous Continuous Emeterio Reeve, DO       acetaminophen (TYLENOL) tablet 650 mg  650 mg Oral Q6H PRN Emeterio Reeve, DO       Or   acetaminophen (TYLENOL) suppository 650 mg  650 mg Rectal Q6H PRN Emeterio Reeve, DO       albuterol (PROVENTIL) (2.5 MG/3ML) 0.083% nebulizer solution 2.5 mg  2.5 mg Nebulization Q6H PRN Tyler Pita, MD       arformoterol [facility]) nebulizer solution 15 mcg  15 mcg Nebulization BID Tyler Pita, MD   15 mcg at 2021/11/07 0731   azithromycin (ZITHROMAX) 500 mg in sodium chloride 0.9 % 250 mL IVPB  500 mg Intravenous Q24H Emeterio Reeve, DO 250 mL/hr at 10/31/21 2027 500 mg at 10/31/21 2027   Chlorhexidine Gluconate Cloth 2 % PADS 6 each  6 each Topical Daily Annita Brod, MD   6 each at 11-07-21 1114   diphenhydrAMINE (BENADRYL) injection 12.5 mg  12.5 mg Intravenous Q6H PRN Annita Brod, MD       enoxaparin (LOVENOX) injection 90 mg  1.25 mg/kg Subcutaneous Q12H Lorna Dibble, [facility]  90 mg at 11-09-21 1004   glycopyrrolate (ROBINUL) tablet 1 mg  1 mg Oral Q4H PRN Emeterio Reeve, DO       Or   glycopyrrolate (ROBINUL) injection 0.2 mg  0.2 mg Subcutaneous Q4H PRN Emeterio Reeve, DO       Or   glycopyrrolate (ROBINUL) injection 0.2 mg  0.2 mg Intravenous Q4H PRN Emeterio Reeve, DO       haloperidol lactate (HALDOL) injection 2.5-5 mg  2.5-5 mg Intravenous Q4H PRN Emeterio Reeve, DO       insulin aspart (novoLOG) injection 0-6 Units  0-6 Units Subcutaneous Q4H Lorna Dibble, RPH   1 Units at 09-Nov-2021 1217   iohexol (OMNIPAQUE) 300 MG/ML solution 80 mL  80 mL Intravenous Once PRN Pabon, Diego F, MD       LORazepam (ATIVAN) injection 2-4 mg  2-4 mg Intravenous Q4H PRN Emeterio Reeve, DO       methocarbamol (ROBAXIN) 500 mg in dextrose 5 % 50 mL IVPB  500 mg  Intravenous Q8H PRN Annita Brod, MD       metoprolol tartrate (LOPRESSOR) injection 5 mg  5 mg Intravenous Q6H PRN Emeterio Reeve, DO   5 mg at 10/30/21 2027   morphine (PF) 2 MG/ML injection 2 mg  2 mg Intravenous Q3H PRN Annita Brod, MD   2 mg at 10/30/21 2255   morphine 157m in NS 1073m(71m59mL) infusion - premix  0-20 mg/hr Intravenous Continuous AleEmeterio ReeveO       morphine bolus via infusion 5 mg  5 mg Intravenous Q5 min PRN AleEmeterio ReeveO       ondansetron (ZOFRAN-ODT) disintegrating tablet 4 mg  4 mg Oral Q6H PRN AleEmeterio ReeveO       Or   ondansetron (ZOSouthern Eye Surgery Center LLCnjection 4 mg  4 mg Intravenous Q6H PRN AleEmeterio ReeveO       pantoprazole (PROTONIX) injection 40 mg  40 mg Intravenous Q12H KriAnnita BrodD   40 mg at 07/Aug 08, 202304   piperacillin-tazobactam (ZOSYN) IVPB 3.375 g  3.375 g Intravenous Q8H DolVirl Cagey RPH 12.5 mL/hr at 07/08-Aug-202331 3.375 g at 10/10/06/2331   polyvinyl alcohol (LIQUIFILM TEARS) 1.4 % ophthalmic solution 1 drop  1 drop Both Eyes QID PRN AleEmeterio ReeveO       revefenacin (YUPELRI) nebulizer solution 175 mcg  175 mcg Nebulization Daily GonTyler PitaD   175 mcg at 07/Aug 08, 202331   sodium chloride flush (NS) 0.9 % injection 10-40 mL  10-40 mL Intracatheter Q12H KriAnnita BrodD   10 mL at 07/08-Aug-202306   sodium chloride flush (NS) 0.9 % injection 10-40 mL  10-40 mL Intracatheter PRN KriAnnita BrodD       sodium chloride flush (NS) 0.9 % injection 10-40 mL  10-40 mL Intracatheter Q12H Pabon, Diego F, MD   10 mL at 07/08-08-202306   sodium chloride flush (NS) 0.9 % injection 10-40 mL  10-40 mL Intracatheter PRN Pabon, DieMarjory LiesD       TPN ADULT (ION)   Intravenous Continuous TPN NazRito Ehrlich RPH 75 mL/hr at 10/31/21 1755 New Bag at 10/31/21 1755   TPN ADULT (ION)   Intravenous Continuous TPN ChaBenita GutterPH        VITAL SIGNS: BP 123/66   Pulse 98   Temp 97.9 F (36.6 C)  (Oral)   Resp 19   Ht 5' 4"  (1.626 m)  Wt 164 lb 7.4 oz (74.6 kg)   SpO2 97%   BMI 28.23 kg/m  Filed Weights   10/11/2021 0807 10/29/21 1655  Weight: 158 lb (71.7 kg) 164 lb 7.4 oz (74.6 kg)    Estimated body mass index is 28.23 kg/m as calculated from the following:   Height as of this encounter: 5' 4"  (1.626 m).   Weight as of this encounter: 164 lb 7.4 oz (74.6 kg).  LABS: CBC:    Component Value Date/Time   WBC 7.1 2021/11/29 0430   HGB 8.3 (L) 2021-11-29 0430   HCT 28.9 (L) 11-29-21 0430   PLT 189 2021/11/29 0430   MCV 115.1 (H) 2021/11/29 0430   NEUTROABS 11.2 (H) 10/30/2021 2127   LYMPHSABS 0.7 10/30/2021 2127   MONOABS 0.5 10/30/2021 2127   EOSABS 0.0 10/30/2021 2127   BASOSABS 0.0 10/30/2021 2127   Comprehensive Metabolic Panel:    Component Value Date/Time   NA 145 2021/11/29 0953   K 4.5 11/29/2021 0953   CL 113 (H) 2021-11-29 0953   CO2 24 11/29/2021 0953   BUN 47 (H) 11/29/21 0953   CREATININE 0.92 November 29, 2021 0953   GLUCOSE 172 (H) 11/29/21 0953   CALCIUM 8.5 (L) 29-Nov-2021 0953   AST 14 (L) 29-Nov-2021 0953   ALT 8 11/29/21 0953   ALKPHOS 51 11-29-21 0953   BILITOT 0.4 2021/11/29 0953   PROT 5.7 (L) 2021/11/29 0953   ALBUMIN 2.3 (L) Nov 29, 2021 0953    RADIOGRAPHIC STUDIES: DG ABD ACUTE 2+V W 1V CHEST  Result Date: 11/29/21 CLINICAL DATA:  Status post colostomy takedown. EXAM: DG ABDOMEN ACUTE WITH 1 VIEW CHEST COMPARISON:  October 30, 2021. FINDINGS: Stable cardiomegaly. Nasogastric tube is seen entering the stomach. Right internal jugular Port-A-Cath is unchanged in position. Mild central pulmonary vascular congestion is noted. Bibasilar atelectasis or edema is noted with associated pleural effusions. Dilated small bowel loops are seen in the left side of the abdomen. No colonic dilatation is noted. Surgical drain is noted. IMPRESSION: Dilated small bowel loops are noted concerning for ileus or possibly distal small bowel obstruction. Mild  central pulmonary vascular congestion is noted with bibasilar atelectasis or edema and associated pleural effusions. Electronically Signed   By: Marijo Conception M.D.   On: 11/29/21 11:05   DG Chest 1 View  Result Date: 10/30/2021 CLINICAL DATA:  Hypoxia. EXAM: CHEST  1 VIEW COMPARISON:  Chest radiograph dated 10/29/2021. FINDINGS: Support lines and tubes in similar position. Interval worsening of airspace opacities involving the mid to lower lung field, right greater left, compared to prior radiograph. Findings likely represent increased pleural effusion and associated atelectasis or infiltrate. No pneumothorax. Stable cardiomegaly. Atherosclerotic calcification of the aorta. Degenerative changes of the spine. No acute osseous pathology. IMPRESSION: Worsening airspace opacities involving the mid to lower lung fields, right greater left, likely representing increased pleural effusion and associated atelectasis or infiltrate. Electronically Signed   By: Anner Crete M.D.   On: 10/30/2021 21:27   CT ABDOMEN PELVIS W CONTRAST  Result Date: 10/29/2021 CLINICAL DATA:  Abdominal pain, nonlocalized EXAM: CT ABDOMEN AND PELVIS WITH CONTRAST TECHNIQUE: Multidetector CT imaging of the abdomen and pelvis was performed using the standard protocol following bolus administration of intravenous contrast. RADIATION DOSE REDUCTION: This exam was performed according to the departmental dose-optimization program which includes automated exposure control, adjustment of the mA and/or kV according to patient size and/or use of iterative reconstruction technique. CONTRAST:  16m OMNIPAQUE IOHEXOL 300 MG/ML  SOLN COMPARISON:  CT chest, abdomen and pelvis dated Aug 12, 2021. FINDINGS: Lower chest: Small bilateral pleural effusions and atelectasis. Hepatobiliary: Small lesion which was previously described in the anterior inferior right lobe of the liver is no longer visualized. Suspicious liver lesions. Status post  cholecystectomy. No biliary dilatation. Pancreas: Unremarkable. No pancreatic ductal dilatation or surrounding inflammatory changes. Spleen: Normal in size without focal abnormality. Adrenals/Urinary Tract: Bilateral adrenal glands are unremarkable. No hydronephrosis. Nonobstructing stone of the right kidney. Stable simple appearing cyst of the left kidney. Small locule of air is seen in the urinary bladder, correlate for history of recent instrumentation. No evidence of bladder wall thickening, however streak artifact from left hip arthroplasty somewhat limits evaluation. Stomach/Bowel: Interval ileostomy takedown. Diffusely dilated small bowel with a gradual transition of the site of the anastomosis. Normal appearance of the anastomosis. Small bowel feces sign seen just upstream of the anastomosis, compatible with delayed transit. No bowel wall thickening or evidence of pneumatosis. Contrast material is seen in the proximal small bowel loops which becomes diluted, no definite passage of contrast beyond the anastomosis. Diverticulosis of the sigmoid colon with retained contrast material seen in several diverticula. Vascular/Lymphatic: Aortic atherosclerosis. New prominent subcentimeter mesenteric lymph nodes seen in the right lower quadrant, reference node measuring 9 mm on series 2, image 57. No pathologically enlarged lymph nodes seen in the pelvis. Reproductive: No pelvic mass. Other: Trace abdominal ascites and small locules of intraperitoneal free air, likely postsurgical. Postsurgical changes of the anterior right abdominal wall with surgical drain place. Musculoskeletal: Prior left total hip arthroplasty. No aggressive appearing osseous lesions. IMPRESSION: 1. Interval ileostomy takedown. Diffusely dilated small bowel, findings are compatible with ileus. 2. Small bilateral pleural effusions and atelectasis. 3. New prominent subcentimeter mesenteric lymph nodes are seen in the right lower quadrant, likely  reactive. Recommend attention on follow-up per clinical protocol. 4. Small locule of air is seen in the urinary bladder, likely due to recent instrumentation. Infection is an additional consideration. Correlate with urinalysis. 5.  Aortic Atherosclerosis (ICD10-I70.0). Electronically Signed   By: Yetta Glassman M.D.   On: 10/29/2021 14:52   DG Chest Port 1 View  Result Date: 10/29/2021 CLINICAL DATA:  PICC line placement. EXAM: PORTABLE CHEST 1 VIEW COMPARISON:  Same day FINDINGS: Interval placement of right-sided PICC line with distal tip in expected position of the SVC. Right internal jugular Port-A-Cath is unchanged. Stable bibasilar atelectasis and effusions are noted. IMPRESSION: Interval placement of right-sided PICC line with distal tip in expected position of the SVC. Electronically Signed   By: Marijo Conception M.D.   On: 10/29/2021 14:09   [place] EKG SITE RITE  Result Date: 10/29/2021 If Site Rite image not attached, placement could not be confirmed due to current cardiac rhythm.  DG ABD ACUTE 2+V W 1V CHEST  Result Date: 10/29/2021 CLINICAL DATA:  Postoperative ileus.  Abdominal pain. EXAM: DG ABDOMEN ACUTE WITH 1 VIEW CHEST COMPARISON:  October 28, 2021. FINDINGS: Distal tip of nasogastric tube is seen in expected position of proximal stomach. Surgical staples are noted. Mild bibasilar subsegmental atelectasis is noted. Surgical drain is seen with tip in the left upper quadrant. Mildly dilated small bowel loops are noted suggesting ileus. IMPRESSION: Mildly dilated small bowel loops are seen in left side of abdomen consistent with ileus. Distal tip of nasogastric tube seen in expected position of proximal stomach. Mild bibasilar subsegmental atelectasis. Electronically Signed   By: Marijo Conception M.D.   On: 10/29/2021 08:09   DG  Abd 1 View  Result Date: 10/28/2021 CLINICAL DATA:  Feeding tube placement. EXAM: ABDOMEN - 1 VIEW COMPARISON:  October 28, 2021 FINDINGS: Nasogastric tube is seen with  its distal tip noted within the body of the stomach. Its distal side hole is approximately 7.9 cm distal to the expected region of the gastroesophageal junction. The bowel gas pattern is normal. Radiopaque surgical clips are seen within the right upper quadrant. No radio-opaque calculi or other significant radiographic abnormality are seen. IMPRESSION: Nasogastric tube positioning, as described above. Electronically Signed   By: Virgina Norfolk M.D.   On: 10/28/2021 19:23   DG Abd 1 View  Result Date: 10/28/2021 CLINICAL DATA:  Vomiting yesterday. EXAM: ABDOMEN - 1 VIEW COMPARISON:  10/27/2021 FINDINGS: Skin clips and surgical drains consistent with recent surgery. Surgical clips in the right upper quadrant. Residual contrast material within sigmoid colonic diverticula. Mildly gas distended small bowel, similar to prior study. This is likely ileus. No radiopaque stones. Degenerative changes in the spine and right hip. Left hip arthroplasty. IMPRESSION: Mild gaseous distention of small bowel similar to prior study, likely ileus in the postoperative setting. Recent postoperative changes in the abdomen and pelvis. Electronically Signed   By: Lucienne Capers M.D.   On: 10/28/2021 19:01   DG ABD ACUTE 2+V W 1V CHEST  Result Date: 10/27/2021 CLINICAL DATA:  Ileus following gastrointestinal surgery EXAM: DG ABDOMEN ACUTE WITH 1 VIEW CHEST COMPARISON:  CT done on 08/12/2021 FINDINGS: There is moderate dilation of small bowel loops measuring up to 4.5 cm in diameter. Stomach is not distended. There are metallic densities in the region of sigmoid, possibly contrast within diverticula. There is a large caliber catheter in abdomen with its tip in the pelvis, possibly a surgical drain. Skin staples are seen. There is previous left hip arthroplasty. Transverse diameter of the heart is increased. There is crowding of markings in the left parahilar region and both lower lung fields suggesting subsegmental atelectasis.  There is slight improvement in the aeration of medial right lower lung field since 10/25/2021. Tip of right IJ chest port is seen in superior vena cava. IMPRESSION: There is mild-to-moderate dilation of small-bowel loops suggesting ileus. Linear densities in left mid and both lower lung fields suggest subsegmental atelectasis. Electronically Signed   By: Elmer Picker M.D.   On: 10/27/2021 12:15   DG Chest Port 1 View  Result Date: 10/25/2021 CLINICAL DATA:  Cough, shortness of breath EXAM: PORTABLE CHEST 1 VIEW COMPARISON:  10/23/2021 FINDINGS: Right-sided Port-A-Cath in satisfactory position. Calcified right upper lobe pulmonary nodule likely reflecting sequela prior granulomatous disease. Right lower lobe hazy airspace disease concerning for atelectasis versus pneumonia. No pleural effusion or pneumothorax. Heart and mediastinal contours are unremarkable. No acute osseous abnormality. IMPRESSION: 1. Right lower lobe hazy airspace disease concerning for atelectasis versus pneumonia. Electronically Signed   By: Kathreen Devoid M.D.   On: 10/25/2021 11:42   ECHOCARDIOGRAM COMPLETE  Result Date: 10/25/2021    ECHOCARDIOGRAM REPORT   Patient Name:   LAWAN NANEZ Date of Exam: 10/24/2021 Medical Rec #:  [id number]        Height:       64.0 in Accession #:    [id number]       Weight:       158.0 lb Date of Birth:  1940-05-30        BSA:          1.770 m Patient Age:    41 years  BP:           108/69 mmHg Patient Gender: F                HR:           83 bpm. Exam Location:  [facility] Procedure: 2D Echo and Intracardiac Opacification Agent Indications:     Abnormal ECG R94.31                  Atrial Fibrillation I48.91  History:         Patient has no prior history of Echocardiogram examinations.  Sonographer:     Kathlen Brunswick RDCS Referring Phys:  [id number] DIEGO F PABON Diagnosing Phys: Kathlyn [place] MD  Sonographer Comments: Technically difficult study due to poor echo windows, no subcostal window  and suboptimal apical window. Image acquisition challenging due to respiratory motion. IMPRESSIONS  1. Left ventricular ejection fraction, by estimation, is 55 to 60%. The left ventricle has normal function. Left ventricular endocardial border not optimally defined to evaluate regional wall motion. Left ventricular diastolic parameters are indeterminate.  2. Right ventricular systolic function is normal. The right ventricular size is normal. Tricuspid regurgitation signal is inadequate for assessing PA pressure.  3. The mitral valve is normal in structure. No evidence of mitral valve regurgitation. No evidence of mitral stenosis.  4. The aortic valve is normal in structure. Aortic valve regurgitation is not visualized. No aortic stenosis is present.  5. Technically difficult study due to poor echo windows, no subcostal window and suboptimal apical window. In addition, A-fib with RVR is noted. FINDINGS  Left Ventricle: Left ventricular ejection fraction, by estimation, is 55 to 60%. The left ventricle has normal function. Left ventricular endocardial border not optimally defined to evaluate regional wall motion. Definity contrast agent was given IV to delineate the left ventricular endocardial Alfio Loescher. The left ventricular internal cavity size was normal in size. There is no left ventricular hypertrophy. Left ventricular diastolic parameters are indeterminate. Right Ventricle: The right ventricular size is normal. No increase in right ventricular wall thickness. Right ventricular systolic function is normal. Tricuspid regurgitation signal is inadequate for assessing PA pressure. Left Atrium: Left atrial size was normal in size. Right Atrium: Right atrial size was normal in size. Pericardium: There is no evidence of pericardial effusion. Mitral Valve: The mitral valve is normal in structure. No evidence of mitral valve regurgitation. No evidence of mitral valve stenosis. Tricuspid Valve: The tricuspid valve is normal  in structure. Tricuspid valve regurgitation is trivial. No evidence of tricuspid stenosis. Aortic Valve: The aortic valve is normal in structure. Aortic valve regurgitation is not visualized. No aortic stenosis is present. Aortic valve peak gradient measures 6.2 mmHg. Pulmonic Valve: The pulmonic valve was normal in structure. Pulmonic valve regurgitation is not visualized. No evidence of pulmonic stenosis. Aorta: The aortic root is normal in size and structure. Venous: The inferior vena cava was not well visualized. IAS/Shunts: No atrial level shunt detected by color flow Doppler.  LEFT VENTRICLE PLAX 2D LVIDd:         4.23 cm     Diastology LVIDs:         2.93 cm     LV e' medial:    11.20 cm/s LV PW:         1.08 cm     LV E/e' medial:  6.7 LV IVS:        0.86 cm     LV e' lateral:   12.60 cm/s LVOT  diam:     1.80 cm     LV E/e' lateral: 5.9 LV SV:         29 LV SV Index:   16 LVOT Area:     2.54 cm  LV Volumes (MOD) LV vol d, MOD A2C: 67.8 ml LV vol d, MOD A4C: 72.3 ml LV vol s, MOD A2C: 21.3 ml LV vol s, MOD A4C: 36.6 ml LV SV MOD A2C:     46.5 ml LV SV MOD A4C:     72.3 ml LV SV MOD BP:      41.6 ml RIGHT VENTRICLE RV Basal diam:  2.05 cm RV S prime:     12.00 cm/s TAPSE (M-mode): 1.5 cm LEFT ATRIUM             Index LA diam:        2.40 cm 1.36 cm/m LA Vol (A2C):   32.1 ml 18.14 ml/m LA Vol (A4C):   25.9 ml 14.63 ml/m LA Biplane Vol: 29.8 ml 16.84 ml/m  AORTIC VALVE                 PULMONIC VALVE AV Area (Vmax): 1.57 cm     PV Vmax:       0.87 m/s AV Vmax:        125.00 cm/s  PV Peak grad:  3.0 mmHg AV Peak Grad:   6.2 mmHg LVOT Vmax:      77.00 cm/s LVOT Vmean:     53.000 cm/s LVOT VTI:       0.113 m  AORTA Ao Root diam: 2.90 cm MITRAL VALVE               TRICUSPID VALVE MV Area (PHT): 5.75 cm    TV Peak grad:   18.0 mmHg MV Decel Time: 132 msec    TV Vmax:        2.12 m/s MV E velocity: 74.60 cm/s MV A velocity: 51.00 cm/s  SHUNTS MV E/A ratio:  1.46        Systemic VTI:  0.11 m                             Systemic Diam: 1.80 cm Kathlyn Sacramento MD Electronically signed by Kathlyn Sacramento MD Signature Date/Time: 10/25/2021/9:50:17 AM    Final    DG CHEST PORT 1 VIEW  Result Date: 10/23/2021 CLINICAL DATA:  Fever EXAM: PORTABLE CHEST 1 VIEW COMPARISON:  Previous studies including the examination of 06/03/2021 FINDINGS: Transverse diameter of heart is increased. There are no signs of pulmonary edema. Increased markings are seen in right lower lung field. Rest of the lung fields are clear. There is minimal blunting of right lateral CP angle. There is no pneumothorax. Tip of right IJ chest port is seen in superior vena cava. IMPRESSION: Cardiomegaly. There are no signs of pulmonary edema. Radiograph increased markings are seen in medial right lower lung fields suggesting atelectasis/pneumonia. Electronically Signed   By: Elmer Picker M.D.   On: 10/23/2021 16:37    PERFORMANCE STATUS (ECOG) : 4 - Bedbound  Review of Systems Unable to complete  Physical Exam General: Critically ill-appearing Cardiovascular: Irregular Pulmonary: On BiPAP Extremities: no edema, no joint deformities Skin: no rashes Neurological: Poorly responsive  IMPRESSION: Dr. Sheppard Coil and I met with patient's husband, ex-husband, son, and daughter.  Family all relate feeling that patient is suffering in light of her overall clinical decline.  They say  that patient has talked for years about not wanting aggressive measures at end-of-life and they do not feel the patient would be in agreement with the current scope of treatment.  They describe a moment of clarity earlier in the hospitalization when patient "begged" to be taken off the BiPAP.  We discussed the option of transitioning to comfort care and all family verbalized agreement with that plan.  They recognize that patient is nearing end-of-life and state that they just want to keep her as comfortable as possible.  PLAN: -Transition to comfort care -Anticipate  in-hospital death  Case and plan discussed with Dr. Sheppard Coil  Time Total: 60 minutes  Visit consisted of counseling and education dealing with the complex and emotionally intense issues of symptom management and palliative care in the setting of serious and potentially life-threatening illness.Greater than 50%  of this time was spent counseling and coordinating care related to the above assessment and plan.  Signed by: Altha Harm, PhD, NP-C

## 2021-11-02 NOTE — Progress Notes (Signed)
Nutrition Follow-up  DOCUMENTATION CODES:   Not applicable  INTERVENTION:   Continue TPN per pharmacy- currently provides 1832kcal/day and 108g/day protein   Daily weights   Monitor triglycerides   NUTRITION DIAGNOSIS:   Increased nutrient needs related to post-op healing as evidenced by estimated needs.  GOAL:   Patient will meet greater than or equal to 90% of their needs -met with TPN   MONITOR:   Diet advancement, Labs, Weight trends, Skin, I & O's, Other (Comment) (TPN)  ASSESSMENT:   81 y/o female with h/o metastatic colon cancer s/p exploratory laparotomy 8/22 ( with mobilization of the splenic flexure, subtotal colectomy, end ileostomy, wound VAC placement), CVA, gout, HTN, HLD, hypothyroid, PAF, CKD III, diverticulosis and CHF who is s/p ileostomy take down, abdominal wall reconstruction, incisional hernia repair and wound vac placement 3/14 complicated by post op ileus, CAP and volume overload.  Visited pt's room today. Pt currently on bipap with respiratory desaturation. Pt remains on TPN at goal rate. Refeed labs stable. Triglycerides slightly elevated; will monitor. KUB from today reports dilated small bowel loops concerning for ileus or possible distal small bowel obstruction. NGT remains in place with 236m output. Pt with concerns for volume overload. No new weight since admission; will order daily weights. IVF discontinued and TPN concentrated. No BM or flatus yet.   Medications reviewed and include: lovenox, insulin, protonix, azithromycin, zosyn  Labs reviewed: K 4.5 wnl, BUN 47(H), P 2.3(L), Mg 2.4 wnl BNP- 796(H) Triglycerides- 211(H) Hgb 8.3(L), Hct 28.9(L) Cbgs- 159, 149, 170 x 24 hrs  Diet Order:   Diet Order             Diet NPO time specified Except for: Ice Chips  Diet effective now                  EDUCATION NEEDS:   Education needs have been addressed  Skin:  Skin Assessment: Skin Integrity Issues: Skin Integrity Issues:: Wound  VAC Wound Vac: abdomen  Last BM:  10/28/21 (type 7)  Height:   Ht Readings from Last 1 Encounters:  10/25/2021 _0  (1.626 m)    Weight:   Wt Readings from Last 1 Encounters:  10/29/21 74.6 kg    Ideal Body Weight:  54.5 kg  BMI:  Body mass index is 28.23 kg/m.  Estimated Nutritional Needs:   Kcal:  1700-1900kcal/day  Protein:  85-95g/day  Fluid:  1.4-1.6L/day  CKoleen DistanceMS, RD, LDN Please refer to AM[facility] Ltdfor RD and/or RD on-call/weekend/after hours pager

## 2021-11-02 NOTE — Consult Note (Signed)
PHARMACY - TOTAL PARENTERAL NUTRITION CONSULT NOTE   Indication: Prolonged ileus  Patient Measurements: Height: 5\' 4"  (162.6 cm) Weight: 74.6 kg (164 lb 7.4 oz) IBW/kg (Calculated) : 54.7 TPN AdjBW (KG): 58.9 Body mass index is 28.23 kg/m. Usual Weight: 71.7kg  Assessment:  [name] w/ h/o colon cancer (adenocarcinoma with large bowel obstruction requiring emergent laparotomy and subtotal colectomy in August 2022) on 7/28 now POD9 from an ileostomy takedown complicated by a post-op illeus and N/V 7/27 midday. Pharmacy consulted for TPN and electrolyte mgmt.  Glucose / Insulin: No apparent history of diabetes. Does appear to have impaired glucose tolerance. BG goal 140 - 180. Patient has maintained near goal range with low amount of insulin requirement Electrolytes: Mild hypophosphatemia. Mild hyperchloremia Renal: Scr < 1. BUN is elevated Hepatic: No transaminitis. LFTs within normal limits Intake / Output; MIVF: I&O: + 4.9L. No MIVF GI Imaging: 7/26 CT Abd - Mild-to-moderate dilation of small-bowel loops suggesting ileus. 7/26 KUB - Mild gaseous distention of small bowel similar to prior study, likely ileus in the postoperative setting. Recent postoperative changes in the abdomen and pelvis. 7/27  CT abd/pelvis w/ Con - Interval ileostomy takedown. Diffusely dilated small bowel, findings are compatible with ileus.  GI Surgeries / Procedures:  August 2022 - large bowel obstruction requiring emergent laparotomy and subtotal colectomy October 21 2021 - Ileostomy take down.  Central access:  TPN start date: 10/29/2021  Nutritional Goals: Goal TPN rate is 60 mL/hr (provides 100.8 g of protein and 1895 kcals per day)  RD Assessment: Estimated Needs Total Energy Estimated Needs: 1700-1900 Total Protein Estimated Needs: 95-110 grams Total Fluid Estimated Needs: > 1.7 L  Current Nutrition:  TPN  Plan:  --Modify TPN to new goal rate of 60 cc/hr to decrease fluid content given  concerns for CHF --Electrolytes in TPN: Na 54mEq/L, K 71mEq/L, Ca 60mEq/L, Mg 28mEq/L, and Phos 52mmol/L. Cl:Ac 1:1 Add standard MVI and trace elements to TPN Noted mild hypophosphatemia, plan to replace tomorrow AM if continues to trend downwards --Continue Sensitive q4h SSI and adjust as needed  --Monitor TPN labs on Mon/Thurs, daily until stable  Jenna [name] Jenna [name] November 03, 2021,10:58 AM

## 2021-11-02 NOTE — Progress Notes (Signed)
   2021/11/04 1440  Clinical Encounter Type  Visited With Family;Other (Comment) (EOL)  Visit Type Initial;Spiritual support;Social support  Referral From Other (Comment)  Spiritual Encounters  Spiritual Needs Grief support   Chaplain Deloria Lair responded to need to support family as they make  change in status to comfort care. Spouse, three adult children and granddaugther at bedside.  Chaplain B offered her compassionate presence and sought to assess what their needs might be. Family just preferring to be at bedside in mutual support.  F/u up about an hour later to see that bereavement cart was arriving from dietary.

## 2021-11-02 NOTE — IPAL (Signed)
  Interdisciplinary Goals of Care Family Meeting   Date carried out: 15-Nov-2021  Location of the meeting: Conference room  Member's involved: Physician, Bedside Registered Nurse, Family Member or next of kin, and Palliative care team member  Durable Power of Attorney or acting medical decision maker: patient's husband, 2 adult children     Discussion: We discussed goals of care for Jenna [name] with patient's husband, patient's 2 adult children, additional family member and along with oncology palliative care team, [employer].  Discussed patient's clinical course.  Family is not wanting to see her suffer, and states that she would not want aggressive measures particularly intubation.  Given underlying cancer, and her decline while here in the hospital, she would probably not be a good candidate for continued chemotherapy and family agrees that that would probably not be something that she would want to pursue.  Family is opting for comfort measures only at this time, allowed some time for additional family to arrive at bedside and I evaluated the patient again, explained everything about the process to family, all questions answered.  Code status: Full DNR  Disposition: In-patient comfort care  Time spent for the meeting: 35 minutes     Emeterio Reeve, DO  11/15/2021, 1:25 PM

## 2021-11-02 NOTE — Discharge Summary (Signed)
Physician Discharge Summary   Patient: Jenna [name] MRN: [id number]  DOB: 06-20-1940   Admit:     Date of Admission: 10/23/2021 Admitted from: home   Discharge: Date of discharge: 11/12/21 Disposition:  n/a Condition at discharge:  deceased   CODE STATUS: DNR    Discharge Physician: Emeterio Reeve, DO [facility]     [facility]P: [facility]  Recommendations for Outpatient Follow-up:  N/a   Hospital Course:    81 year old female with past medical history of colon cancer (adenocarcinoma with large bowel obstruction requiring emergent laparotomy and subtotal colectomy in August 2022), hypothyroidism and hyperlipidemia admitted on 7/20 by general surgery for colostomy reversal and abdominal wall reconstruction who developed new onset paroxysmal atrial fibrillation on 7/22.  Recent PET scan discovered metastases to liver and lung.   CARDIAC: Hospitalists were consulted for management of postoperative atrial fibrillation.  Patient started on IV amiodarone drip. Transitioned off and rate has been controlled. Cardiology consulted given hypotensive. Started low dose metoprolol. BP remained low, pt asymptomatic but not ambulating much. Have had to hold metoprolol at tiems d/t hypotension. Echo this admission EF 55-60, normal LV function, diastolic parameters indeterminate, normal RV fxn. 07/26: switching metoprolol to 12.5 mg bid and transition from lovenox to eliquis 5 mg bid at discretion of surgeon and hospitalist - cardiology has signed off. 07/27: BP low but stable, was hypotensive overnight which responded to IV fluids. 07/28-07/29: HR improving, BP stable. 07/29 PM: SOB and CXR concerning for worsening cardiomegaly (vs rotation?) and pt started on Lasix continued into 07/30.  RESPIRATORY / ID: Repeated CXR 07/24 to eval PNA given coughing: RLL findings appear progressed compared to previous CXR, initiated CAP treatment. Pt had been stable on room air, cough  improving, using her incentive spirometer. New O2 requirement but she was declining CXR 07/29 "tired of Xrays," in evening O2 became dislodged and pt desaturated requiring BiPap, CXR at that time noted new cardiomegaly (vs rotation?), pt was transfer to SDU w/ blood transfusion and lasix x2 doses ordered, ABG improved on BiPap, pt has been on and off BiPap, encouraged compliance w this and pulmonary is following along  SURGICAL / GI: Concern for ileus 07/26 but no evidence of serious complications. 07/27: no BM x4 days, (+)nausea mid-day progressed to vomiting. Placed NG tube for concern for obstruction, gen surg (Dr Dahlia Byes) following, NG helped bloating and symptoms. 07/28: TPN and PICC. CT showed ileus no obstruction or other intraabdominal concerns.  GOALS OF CARE - COMFORT MEASURES: See full IPAL note 2021-11-12.  Given patient's continued decline and minimal to the low likelihood of recovery, as well as underlying cancer, medical team and family in shared decision making have opted to discontinue aggressive measures at approx 1:15 PM and place patient on comfort care protocol. Patient was pronounced by 2 RN's at 17:18 11-12-2021        Discharge Diagnoses: Principal Problem:   S/P colostomy takedown Active Problems:   Paroxysmal atrial fibrillation with RVR ([place])   Acute kidney injury superimposed on stage IIIa CKD ([place])   Metastatic colon cancer to liver [facility])   Essential hypertension   Hyperlipidemia   Acquired hypothyroidism   Overweight (BMI 25.0-29.9)   Atrial fibrillation [facility])   Palliative care encounter      Discharge Instructions  Allergies as of 11-12-21   No Known Allergies           Follow-up Information     Gerrie Nordmann, NP Follow up on  11/12/2021.   Specialty: Cardiology Why: 9:15 AM Contact information: [address] [place] [address] [phone]                 No Known Allergies   Subjective: n/a   Discharge  Exam: Vitals:   November 04, 2021 1124 Nov 04, 2021 1200  BP:  123/66  Pulse: (!) 102 98  Resp: (!) 21 19  Temp:    SpO2: 98% 97%  Exam - see RN notes      The results of significant diagnostics from this hospitalization (including imaging, microbiology, ancillary and laboratory) are listed below for reference.     Microbiology: No results found for this or any previous visit (from the past 240 hour(s)).   Labs: BNP (last 3 results) Recent Labs    10/30/21 2127 10/31/21 0500 04-Nov-2021 0430  BNP 1,170.7* 1,003.6* 865.7*   Basic Metabolic Panel: Recent Labs  Lab 10/28/21 0525 10/29/21 0930 10/30/21 0610 10/30/21 2127 10/31/21 0435 11-04-21 0953  NA 134* 135 133* 133* 136 145  K 3.5 4.1 4.3 4.5 4.4 4.5  CL 107 107 107 108 108 113*  CO2 19* 15* 18* 18* 21* 24  GLUCOSE 100* 107* 148* 204* 196* 172*  BUN 34* 32* 37* 42* 44* 47*  CREATININE 1.30* 1.36* 1.25* 1.12* 1.12* 0.92  CALCIUM 8.6* 8.7* 8.5* 8.4* 8.3* 8.5*  MG 1.9 2.0 2.2  --  2.3 2.4  PHOS 4.7* 5.6* 4.9*  --  3.1 2.3*   Liver Function Tests: Recent Labs  Lab 10/29/21 0930 10/30/21 0610 2021-11-04 0953  AST 14* 13* 14*  ALT 9 9 8   ALKPHOS 62 58 51  BILITOT 1.2 0.8 0.4  PROT 5.2* 5.5* 5.7*  ALBUMIN 2.1* 2.4* 2.3*   No results for input(s): "LIPASE", "AMYLASE" in the last 168 hours. No results for input(s): "AMMONIA" in the last 168 hours. CBC: Recent Labs  Lab 10/29/21 0930 10/30/21 0610 10/30/21 2127 10/31/21 0435 04-Nov-2021 0430  WBC 12.6* 12.3* 12.6* 9.0 7.1  NEUTROABS  --   --  11.2*  --   --   HGB 7.6* 7.3* 7.7* 8.6* 8.3*  HCT 23.8* 22.7* 23.9* 26.9* 28.9*  MCV 96.4 96.6 97.2 97.1 115.1*  PLT 186 181 197 182 189   Cardiac Enzymes: No results for input(s): "CKTOTAL", "CKMB", "CKMBINDEX", "TROPONINI" in the last 168 hours. BNP: Invalid input(s): "POCBNP" CBG: Recent Labs  Lab 10/31/21 2001 2021-11-04 0000 2021-11-04 0446 2021-11-04 0721 11-04-21 1134  GLUCAP 175* 170* 149* 159* 184*    D-Dimer Recent Labs    10/31/21 0435  DDIMER 2.42*   Hgb A1c No results for input(s): "HGBA1C" in the last 72 hours. Lipid Profile Recent Labs    10/31/21 0435 2021-11-04 0953  TRIG 270* 211*   Thyroid function studies No results for input(s): "TSH", "T4TOTAL", "T3FREE", "THYROIDAB" in the last 72 hours.  Invalid input(s): "FREET3" Anemia work up No results for input(s): "VITAMINB12", "FOLATE", "FERRITIN", "TIBC", "IRON", "RETICCTPCT" in the last 72 hours. Urinalysis    Component Value Date/Time   COLORURINE YELLOW (A) 11/14/2020 2000   APPEARANCEUR HAZY (A) 11/14/2020 2000   LABSPEC 1.027 11/14/2020 2000   PHURINE 6.0 11/14/2020 2000   GLUCOSEU NEGATIVE 11/14/2020 2000   HGBUR NEGATIVE 11/14/2020 2000   BILIRUBINUR NEGATIVE 11/14/2020 2000   [place] NEGATIVE 11/14/2020 2000   PROTEINUR NEGATIVE 11/14/2020 2000   NITRITE NEGATIVE 11/14/2020 2000   LEUKOCYTESUR MODERATE (A) 11/14/2020 2000   Sepsis Labs Recent Labs  Lab 10/30/21 0610 10/30/21  2127 10/31/21 0435 2021/11/26 0430  WBC 12.3* 12.6* 9.0 7.1   Microbiology No results found for this or any previous visit (from the past 240 hour(s)). Imaging DG Chest Port 1 View  Result Date: 10/25/2021 CLINICAL DATA:  Cough, shortness of breath EXAM: PORTABLE CHEST 1 VIEW COMPARISON:  10/23/2021 FINDINGS: Right-sided Port-A-Cath in satisfactory position. Calcified right upper lobe pulmonary nodule likely reflecting sequela prior granulomatous disease. Right lower lobe hazy airspace disease concerning for atelectasis versus pneumonia. No pleural effusion or pneumothorax. Heart and mediastinal contours are unremarkable. No acute osseous abnormality. IMPRESSION: 1. Right lower lobe hazy airspace disease concerning for atelectasis versus pneumonia. Electronically Signed   By: Kathreen Devoid M.D.   On: 10/25/2021 11:42   ECHOCARDIOGRAM COMPLETE  Result Date: 10/25/2021    ECHOCARDIOGRAM REPORT   Patient Name:   Jenna [name] Date of Exam: 10/24/2021 Medical Rec #:  [id number]        Height:       64.0 in Accession #:    [id number]       Weight:       158.0 lb Date of Birth:  [birth year]        BSA:          1.770 m Patient Age:    56 years         BP:           108/69 mmHg Patient Gender: F                HR:           83 bpm. Exam Location:  [facility] Procedure: 2D Echo and Intracardiac Opacification Agent Indications:     Abnormal ECG R94.31                  Atrial Fibrillation I48.91  History:         Patient has no prior history of Echocardiogram examinations.  Sonographer:     Kathlen Brunswick RDCS Referring Phys:  [id number] DIEGO F PABON Diagnosing Phys: Kathlyn Sacramento MD  Sonographer Comments: Technically difficult study due to poor echo windows, no subcostal window and suboptimal apical window. Image acquisition challenging due to respiratory motion. IMPRESSIONS  1. Left ventricular ejection fraction, by estimation, is 55 to 60%. The left ventricle has normal function. Left ventricular endocardial border not optimally defined to evaluate regional wall motion. Left ventricular diastolic parameters are indeterminate.  2. Right ventricular systolic function is normal. The right ventricular size is normal. Tricuspid regurgitation signal is inadequate for assessing PA pressure.  3. The mitral valve is normal in structure. No evidence of mitral valve regurgitation. No evidence of mitral stenosis.  4. The aortic valve is normal in structure. Aortic valve regurgitation is not visualized. No aortic stenosis is present.  5. Technically difficult study due to poor echo windows, no subcostal window and suboptimal apical window. In addition, A-fib with RVR is noted. FINDINGS  Left Ventricle: Left ventricular ejection fraction, by estimation, is 55 to 60%. The left ventricle has normal function. Left ventricular endocardial border not optimally defined to evaluate regional wall motion. Definity contrast agent was given IV to delineate the left  ventricular endocardial borders. The left ventricular internal cavity size was normal in size. There is no left ventricular hypertrophy. Left ventricular diastolic parameters are indeterminate. Right Ventricle: The right ventricular size is normal. No increase in right ventricular wall thickness. Right ventricular systolic function is normal. Tricuspid regurgitation signal is inadequate for assessing PA  pressure. Left Atrium: Left atrial size was normal in size. Right Atrium: Right atrial size was normal in size. Pericardium: There is no evidence of pericardial effusion. Mitral Valve: The mitral valve is normal in structure. No evidence of mitral valve regurgitation. No evidence of mitral valve stenosis. Tricuspid Valve: The tricuspid valve is normal in structure. Tricuspid valve regurgitation is trivial. No evidence of tricuspid stenosis. Aortic Valve: The aortic valve is normal in structure. Aortic valve regurgitation is not visualized. No aortic stenosis is present. Aortic valve peak gradient measures 6.2 mmHg. Pulmonic Valve: The pulmonic valve was normal in structure. Pulmonic valve regurgitation is not visualized. No evidence of pulmonic stenosis. Aorta: The aortic root is normal in size and structure. Venous: The inferior vena cava was not well visualized. IAS/Shunts: No atrial level shunt detected by color flow Doppler.  LEFT VENTRICLE PLAX 2D LVIDd:         4.23 cm     Diastology LVIDs:         2.93 cm     LV e' medial:    11.20 cm/s LV PW:         1.08 cm     LV E/e' medial:  6.7 LV IVS:        0.86 cm     LV e' lateral:   12.60 cm/s LVOT diam:     1.80 cm     LV E/e' lateral: 5.9 LV SV:         29 LV SV Index:   16 LVOT Area:     2.54 cm  LV Volumes (MOD) LV vol d, MOD A2C: 67.8 ml LV vol d, MOD A4C: 72.3 ml LV vol s, MOD A2C: 21.3 ml LV vol s, MOD A4C: 36.6 ml LV SV MOD A2C:     46.5 ml LV SV MOD A4C:     72.3 ml LV SV MOD BP:      41.6 ml RIGHT VENTRICLE RV Basal diam:  2.05 cm RV S prime:     12.00  cm/s TAPSE (M-mode): 1.5 cm LEFT ATRIUM             Index LA diam:        2.40 cm 1.36 cm/m LA Vol (A2C):   32.1 ml 18.14 ml/m LA Vol (A4C):   25.9 ml 14.63 ml/m LA Biplane Vol: 29.8 ml 16.84 ml/m  AORTIC VALVE                 PULMONIC VALVE AV Area (Vmax): 1.57 cm     PV Vmax:       0.87 m/s AV Vmax:        125.00 cm/s  PV Peak grad:  3.0 mmHg AV Peak Grad:   6.2 mmHg LVOT Vmax:      77.00 cm/s LVOT Vmean:     53.000 cm/s LVOT VTI:       0.113 m  AORTA Ao Root diam: 2.90 cm MITRAL VALVE               TRICUSPID VALVE MV Area (PHT): 5.75 cm    TV Peak grad:   18.0 mmHg MV Decel Time: 132 msec    TV Vmax:        2.12 m/s MV E velocity: 74.60 cm/s MV A velocity: 51.00 cm/s  SHUNTS MV E/A ratio:  1.46        Systemic VTI:  0.11 m  Systemic Diam: 1.80 cm Kathlyn Sacramento MD Electronically signed by Kathlyn Sacramento MD Signature Date/Time: 10/25/2021/9:50:17 AM    Final       Time coordinating discharge: Over 30 minutes  SIGNED:  Emeterio Reeve DO [facility]

## 2021-11-02 NOTE — Progress Notes (Signed)
RT called to room for Pt desat to low 69%. Pt BiPAP mask placed by RN and FiO2 increased to 60% then decreased to 50%. Will continue to follow. RN aware

## 2021-11-02 NOTE — Progress Notes (Signed)
OT Cancellation Note  Patient Details Name: Jenna [name] MRN: [id number] DOB: 1941/01/31   Cancelled Treatment:    Reason Eval/Treat Not Completed: Medical issues which prohibited therapy. Pt noted to have transferred to a higher level of care & pt requiring fluctuating levels of O2. Per protocol, will d/c current orders & await new ones once pt more medically stable.  Ardeth Perfect., MPH, MS, OTR/L ascom [phone] 11-05-2021, 8:01 AM

## 2021-11-02 NOTE — Progress Notes (Signed)
Patient passed at 76 pm; primary RN and charge RN verified; Dr. Sheppard Coil informed.

## 2021-11-02 DEATH — deceased

## 2021-11-04 ENCOUNTER — Inpatient Hospital Stay: Payer: Medicare HMO

## 2021-11-12 ENCOUNTER — Ambulatory Visit: Payer: Medicare HMO | Admitting: Cardiology

## 2023-05-16 IMAGING — CT NM PET TUM IMG INITIAL (PI) SKULL BASE T - THIGH
1 of 9 series · 1 of 25 positions shown · non-contrast
Comparison: May 14, 2021.

CLINICAL DATA: Initial treatment strategy for history of LEFT colon
cancer.

EXAM:
NUCLEAR MEDICINE PET SKULL BASE TO THIGH
TECHNIQUE: 8.76 mCi F-18 FDG was injected intravenously. Full-ring PET imaging
was performed from the skull base to thigh after the radiotracer. CT
data was obtained and used for attenuation correction and anatomic
localization.
Fasting blood glucose: 100 mg/dl

[Series 3: ct wb 5.0 b30f · axial · 5.0mm · 0.98mm/px · 1 of 290 slices shown]
[im 290/290  brain]
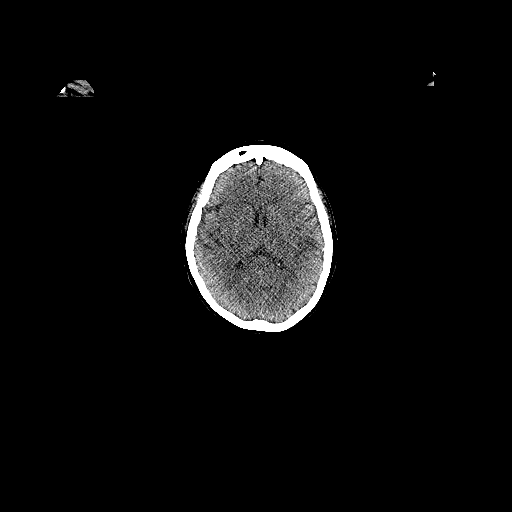

[1 of 25 positions shown; findings below may reference images not displayed]

FINDINGS: Mediastinal blood pool activity: SUV max

Liver activity: SUV max NA

NECK: RIGHT nasal cavity lesion (image [DATE]) this measures 11 mm and
shows a maximum SUV of 19. This is found along the posterior margin
of the septum to the RIGHT of the septum.

LEFT parotid lesion (image 35/3) this measures 13 mm with a maximum
SUV of 10.0.

No additional areas of increased metabolic activity neck or head,
visualized portions of the head.

Incidental CT findings: none

CHEST: Nodule in the RIGHT upper lobe displays a maximum SUV of 3.5.
Lobular margins, (image 86/3) this previously measured approximally
8 mm to 9 mm and is stable.

Granuloma in the RIGHT upper lobe as well more posteriorly.

Small nodule in the RIGHT upper lobe (image 103/3) 3 mm. Not
associated with increased metabolic activity.

No signs of hypermetabolic lymph nodes in the chest.

Incidental CT findings: Aortic atherosclerosis. No aneurysmal
dilation. Normal heart size without substantial pericardial
effusion. Normal caliber of the central pulmonary vessels. Limited
assessment of cardiovascular structures given lack of intravenous
contrast.

ABDOMEN/PELVIS: Small focus of hypoattenuation along the margin of
the RIGHT hemi liver difficult to visualize on the current exam,
hepatic subsegment VI (image 169/3) this measures 11 mm and is
stable with a maximum SUV of 4.9. No additional areas of increased
metabolic activity in the abdomen or the pelvis.

Incidental CT findings: Post cholecystectomy. No acute pancreatic
process or splenic process. Adrenal glands are normal.

No hydronephrosis. Urinary bladder under distended limiting
assessment. No signs of acute gastrointestinal process with changes
of colectomy to the sigmoid level with RIGHT lower quadrant
ileostomy and long colonic pouch as before.

SKELETON: No focal hypermetabolic activity to suggest skeletal
metastasis.

Incidental CT findings: LEFT hip arthroplasty with resultant streak
artifact. Spinal degenerative changes.
IMPRESSION: Findings of LEFT parotid lesion with marked hyper metabolic
features. Findings more likely related to primary parotid neoplasm
though could represent cervical adenopathy. Consider ultrasound and
sampling given the presence of hypermetabolic sinonasal lesion
discussed.

Small sinonasal lesion with marked hypermetabolic features. This is
concerning for small neoplasm in the nasal cavity, direct
visualization is suggested.

RIGHT upper lobe pulmonary nodule with mildly increased metabolic
activity suspicious for neoplasm, only a single additional nodule is
noted and this finding is in the upper lobe. Metastatic disease
versus is second primary in the lung is considered.

Findings of hepatic metastatic disease.

Aortic atherosclerosis.

Postoperative changes related to prior colectomy RIGHT-sided ostomy.

Aortic Atherosclerosis ([id number]).
